# Patient Record
Sex: Female | Born: 1955 | Race: White | Hispanic: No | Marital: Married | Smoking: Never smoker
Health system: Southern US, Community
[De-identification: ages and names within clinical notes are randomized; demographics above are authoritative.]

## PROBLEM LIST (undated history)

## (undated) DIAGNOSIS — N8110 Cystocele, unspecified: Secondary | ICD-10-CM

## (undated) DIAGNOSIS — R0600 Dyspnea, unspecified: Secondary | ICD-10-CM

## (undated) DIAGNOSIS — K219 Gastro-esophageal reflux disease without esophagitis: Secondary | ICD-10-CM

## (undated) DIAGNOSIS — G9332 Myalgic encephalomyelitis/chronic fatigue syndrome: Secondary | ICD-10-CM

## (undated) DIAGNOSIS — A9230 West Nile virus infection, unspecified: Secondary | ICD-10-CM

## (undated) DIAGNOSIS — R7303 Prediabetes: Secondary | ICD-10-CM

## (undated) DIAGNOSIS — A6920 Lyme disease, unspecified: Secondary | ICD-10-CM

## (undated) DIAGNOSIS — J189 Pneumonia, unspecified organism: Secondary | ICD-10-CM

## (undated) DIAGNOSIS — K589 Irritable bowel syndrome without diarrhea: Secondary | ICD-10-CM

## (undated) DIAGNOSIS — K429 Umbilical hernia without obstruction or gangrene: Secondary | ICD-10-CM

## (undated) DIAGNOSIS — Z87442 Personal history of urinary calculi: Secondary | ICD-10-CM

## (undated) DIAGNOSIS — K760 Fatty (change of) liver, not elsewhere classified: Secondary | ICD-10-CM

## (undated) DIAGNOSIS — IMO0002 Reserved for concepts with insufficient information to code with codable children: Secondary | ICD-10-CM

## (undated) DIAGNOSIS — G47419 Narcolepsy without cataplexy: Secondary | ICD-10-CM

## (undated) DIAGNOSIS — T7840XA Allergy, unspecified, initial encounter: Secondary | ICD-10-CM

## (undated) DIAGNOSIS — D126 Benign neoplasm of colon, unspecified: Secondary | ICD-10-CM

## (undated) DIAGNOSIS — Z9889 Other specified postprocedural states: Secondary | ICD-10-CM

## (undated) DIAGNOSIS — R112 Nausea with vomiting, unspecified: Secondary | ICD-10-CM

## (undated) DIAGNOSIS — E785 Hyperlipidemia, unspecified: Secondary | ICD-10-CM

## (undated) DIAGNOSIS — D649 Anemia, unspecified: Secondary | ICD-10-CM

## (undated) DIAGNOSIS — K623 Rectal prolapse: Secondary | ICD-10-CM

## (undated) DIAGNOSIS — R5382 Chronic fatigue, unspecified: Secondary | ICD-10-CM

## (undated) DIAGNOSIS — R253 Fasciculation: Secondary | ICD-10-CM

## (undated) DIAGNOSIS — G709 Myoneural disorder, unspecified: Secondary | ICD-10-CM

## (undated) DIAGNOSIS — K2950 Unspecified chronic gastritis without bleeding: Secondary | ICD-10-CM

## (undated) DIAGNOSIS — K222 Esophageal obstruction: Secondary | ICD-10-CM

## (undated) DIAGNOSIS — M1990 Unspecified osteoarthritis, unspecified site: Secondary | ICD-10-CM

## (undated) HISTORY — DX: Chronic fatigue, unspecified: R53.82

## (undated) HISTORY — DX: Reserved for concepts with insufficient information to code with codable children: IMO0002

## (undated) HISTORY — PX: TONSILLECTOMY: SUR1361

## (undated) HISTORY — DX: Narcolepsy without cataplexy: G47.419

## (undated) HISTORY — PX: TONSILLECTOMY: SHX5217

## (undated) HISTORY — DX: Rectal prolapse: K62.3

## (undated) HISTORY — DX: Benign neoplasm of colon, unspecified: D12.6

## (undated) HISTORY — DX: Esophageal obstruction: K22.2

## (undated) HISTORY — PX: ULNAR NERVE REPAIR: SHX2594

## (undated) HISTORY — PX: OTHER SURGICAL HISTORY: SHX169

## (undated) HISTORY — DX: Unspecified chronic gastritis without bleeding: K29.50

## (undated) HISTORY — DX: Irritable bowel syndrome, unspecified: K58.9

## (undated) HISTORY — PX: ABDOMINAL HYSTERECTOMY: SHX81

## (undated) HISTORY — DX: Myalgic encephalomyelitis/chronic fatigue syndrome: G93.32

## (undated) HISTORY — DX: Lyme disease, unspecified: A69.20

## (undated) HISTORY — PX: SPINE SURGERY: SHX786

## (undated) HISTORY — DX: Fatty (change of) liver, not elsewhere classified: K76.0

## (undated) HISTORY — DX: Hyperlipidemia, unspecified: E78.5

## (undated) HISTORY — DX: Allergy, unspecified, initial encounter: T78.40XA

## (undated) HISTORY — DX: Umbilical hernia without obstruction or gangrene: K42.9

## (undated) HISTORY — PX: PARTIAL HYSTERECTOMY: SHX80

## (undated) HISTORY — DX: West nile virus infection, unspecified: A92.30

## (undated) HISTORY — PX: DIAGNOSTIC LAPAROSCOPY: SUR761

## (undated) HISTORY — PX: BREAST BIOPSY: SHX20

## (undated) HISTORY — DX: Cystocele, unspecified: N81.10

---

## 2001-10-26 DIAGNOSIS — A9230 West Nile virus infection, unspecified: Secondary | ICD-10-CM

## 2001-10-26 HISTORY — DX: West nile virus infection, unspecified: A92.30

## 2002-01-05 ENCOUNTER — Encounter: Admission: RE | Admit: 2002-01-05 | Discharge: 2002-01-05 | Payer: Self-pay | Admitting: *Deleted

## 2004-05-22 ENCOUNTER — Encounter: Admission: RE | Admit: 2004-05-22 | Discharge: 2004-05-22 | Payer: Self-pay | Admitting: Family Medicine

## 2004-06-02 ENCOUNTER — Ambulatory Visit (HOSPITAL_COMMUNITY): Admission: RE | Admit: 2004-06-02 | Discharge: 2004-06-03 | Payer: Self-pay | Admitting: Neurosurgery

## 2004-10-07 ENCOUNTER — Ambulatory Visit: Payer: Self-pay | Admitting: Family Medicine

## 2004-11-20 ENCOUNTER — Ambulatory Visit: Payer: Self-pay | Admitting: Internal Medicine

## 2004-11-25 ENCOUNTER — Ambulatory Visit: Payer: Self-pay | Admitting: Family Medicine

## 2004-12-04 ENCOUNTER — Ambulatory Visit: Payer: Self-pay | Admitting: Internal Medicine

## 2004-12-04 HISTORY — PX: UPPER GASTROINTESTINAL ENDOSCOPY: SHX188

## 2004-12-12 ENCOUNTER — Ambulatory Visit: Payer: Self-pay | Admitting: Internal Medicine

## 2005-05-15 ENCOUNTER — Ambulatory Visit: Payer: Self-pay | Admitting: Family Medicine

## 2006-11-02 ENCOUNTER — Ambulatory Visit: Payer: Self-pay | Admitting: Family Medicine

## 2006-11-24 ENCOUNTER — Ambulatory Visit: Payer: Self-pay | Admitting: Family Medicine

## 2006-11-30 ENCOUNTER — Ambulatory Visit: Payer: Self-pay | Admitting: Family Medicine

## 2008-06-21 ENCOUNTER — Ambulatory Visit: Payer: Self-pay | Admitting: Family Medicine

## 2008-06-21 DIAGNOSIS — E7800 Pure hypercholesterolemia, unspecified: Secondary | ICD-10-CM | POA: Insufficient documentation

## 2008-06-21 DIAGNOSIS — N952 Postmenopausal atrophic vaginitis: Secondary | ICD-10-CM | POA: Insufficient documentation

## 2008-06-21 DIAGNOSIS — N951 Menopausal and female climacteric states: Secondary | ICD-10-CM | POA: Insufficient documentation

## 2008-06-21 DIAGNOSIS — M79609 Pain in unspecified limb: Secondary | ICD-10-CM | POA: Insufficient documentation

## 2008-06-27 ENCOUNTER — Encounter: Payer: Self-pay | Admitting: Family Medicine

## 2008-06-28 ENCOUNTER — Encounter: Payer: Self-pay | Admitting: Family Medicine

## 2008-06-28 ENCOUNTER — Ambulatory Visit: Payer: Self-pay | Admitting: Family Medicine

## 2008-07-30 ENCOUNTER — Ambulatory Visit: Payer: Self-pay | Admitting: Internal Medicine

## 2008-07-30 DIAGNOSIS — K5909 Other constipation: Secondary | ICD-10-CM | POA: Insufficient documentation

## 2008-07-30 DIAGNOSIS — K589 Irritable bowel syndrome without diarrhea: Secondary | ICD-10-CM | POA: Insufficient documentation

## 2008-09-07 ENCOUNTER — Ambulatory Visit: Payer: Self-pay | Admitting: Family Medicine

## 2008-09-11 ENCOUNTER — Ambulatory Visit: Payer: Self-pay | Admitting: Cardiology

## 2008-10-16 ENCOUNTER — Ambulatory Visit: Payer: Self-pay | Admitting: Family Medicine

## 2008-12-31 ENCOUNTER — Ambulatory Visit: Payer: Self-pay | Admitting: Family Medicine

## 2009-01-01 ENCOUNTER — Telehealth: Payer: Self-pay | Admitting: Family Medicine

## 2009-01-02 ENCOUNTER — Telehealth: Payer: Self-pay | Admitting: Internal Medicine

## 2009-01-02 ENCOUNTER — Ambulatory Visit: Payer: Self-pay | Admitting: Internal Medicine

## 2009-01-02 ENCOUNTER — Telehealth (INDEPENDENT_AMBULATORY_CARE_PROVIDER_SITE_OTHER): Payer: Self-pay | Admitting: *Deleted

## 2009-06-27 ENCOUNTER — Ambulatory Visit: Payer: Self-pay | Admitting: Family Medicine

## 2009-07-04 ENCOUNTER — Encounter: Payer: Self-pay | Admitting: Family Medicine

## 2009-07-04 ENCOUNTER — Ambulatory Visit: Payer: Self-pay

## 2009-09-05 ENCOUNTER — Encounter: Payer: Self-pay | Admitting: Family Medicine

## 2009-09-05 ENCOUNTER — Telehealth (INDEPENDENT_AMBULATORY_CARE_PROVIDER_SITE_OTHER): Payer: Self-pay | Admitting: *Deleted

## 2010-04-14 ENCOUNTER — Telehealth (INDEPENDENT_AMBULATORY_CARE_PROVIDER_SITE_OTHER): Payer: Self-pay | Admitting: *Deleted

## 2010-04-14 ENCOUNTER — Ambulatory Visit: Payer: Self-pay | Admitting: Family Medicine

## 2010-04-14 DIAGNOSIS — R197 Diarrhea, unspecified: Secondary | ICD-10-CM | POA: Insufficient documentation

## 2010-05-13 ENCOUNTER — Encounter: Payer: Self-pay | Admitting: Family Medicine

## 2010-05-21 ENCOUNTER — Telehealth: Payer: Self-pay | Admitting: Family Medicine

## 2010-07-10 ENCOUNTER — Encounter: Payer: Self-pay | Admitting: Family Medicine

## 2010-08-06 ENCOUNTER — Ambulatory Visit: Payer: Self-pay | Admitting: Family Medicine

## 2010-08-06 DIAGNOSIS — R350 Frequency of micturition: Secondary | ICD-10-CM | POA: Insufficient documentation

## 2010-08-06 DIAGNOSIS — S2239XA Fracture of one rib, unspecified side, initial encounter for closed fracture: Secondary | ICD-10-CM | POA: Insufficient documentation

## 2010-08-06 LAB — CONVERTED CEMR LAB
Bilirubin Urine: NEGATIVE
Epithelial cells, urine: 1 /lpf
Nitrite: NEGATIVE
Protein, U semiquant: NEGATIVE
Urobilinogen, UA: 0.2
WBC Urine, dipstick: NEGATIVE
WBC, UA: 0 cells/hpf
Yeast, UA: 0

## 2010-08-07 ENCOUNTER — Encounter: Payer: Self-pay | Admitting: Family Medicine

## 2010-08-25 ENCOUNTER — Encounter: Payer: Self-pay | Admitting: Family Medicine

## 2010-09-16 ENCOUNTER — Telehealth: Payer: Self-pay | Admitting: Family Medicine

## 2010-10-15 ENCOUNTER — Encounter: Payer: Self-pay | Admitting: Family Medicine

## 2010-11-16 ENCOUNTER — Encounter: Payer: Self-pay | Admitting: Family Medicine

## 2010-11-23 LAB — CONVERTED CEMR LAB
ALT: 31 units/L (ref 0–35)
AST: 34 units/L (ref 0–37)
Alkaline Phosphatase: 90 units/L (ref 39–117)
BUN: 9 mg/dL (ref 6–23)
Bacteria, UA: 0
Bilirubin Urine: NEGATIVE
Bilirubin, Direct: 0.1 mg/dL (ref 0.0–0.3)
Blood in Urine, dipstick: NEGATIVE
CO2: 31 meq/L (ref 19–32)
Casts: 0 /lpf
Chloride: 98 meq/L (ref 96–112)
Eosinophils Relative: 5.1 % — ABNORMAL HIGH (ref 0.0–5.0)
Glucose, Bld: 89 mg/dL (ref 70–99)
Glucose, Urine, Semiquant: NEGATIVE
Hemoglobin: 13.7 g/dL (ref 12.0–15.0)
Ketones, urine, test strip: NEGATIVE
Lymphocytes Relative: 26.3 % (ref 12.0–46.0)
Monocytes Relative: 7.8 % (ref 3.0–12.0)
Mucus, UA: 0
Nitrite: NEGATIVE
Platelets: 309 10*3/uL (ref 150–400)
Potassium: 4.1 meq/L (ref 3.5–5.1)
Protein, U semiquant: NEGATIVE
RBC / HPF: 0
RDW: 13 % (ref 11.5–14.6)
Sodium: 138 meq/L (ref 135–145)
Specific Gravity, Urine: 1.01
Total Protein: 7.6 g/dL (ref 6.0–8.3)
Urine crystals, microscopic: 0 /hpf
Urobilinogen, UA: 0.2
WBC Urine, dipstick: NEGATIVE
WBC: 5.4 10*3/uL (ref 4.5–10.5)
Yeast, UA: 0
pH: 6

## 2010-11-25 NOTE — Assessment & Plan Note (Signed)
Summary: 30 MIN F/U PER DR TOWER/CLE   Vital Signs:  Patient profile:   55 year old female Height:      65 inches Weight:      167.25 pounds BMI:     27.93 Temp:     98.8 degrees F oral Pulse rate:   96 / minute Pulse rhythm:   regular BP sitting:   114 / 70  (left arm) Cuff size:   regular  Vitals Entered By: Lewanda Rife LPN (August 06, 2010 12:09 PM) CC: follow-up visit at request of pain dr. 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 no trauma-- but did tip foward over a bear cage - felt a pop and a lot of pain , and then happened on the other side  Dr Patsy Lager thought there was an x ray - told it may be a fracture  Dr Neldon Mc ordered 2nd set -- did not see fx but not good views either  was concerned about bone density  pain has gradually gone away  is good about ca and vit D    has broken bones in feet with a good amt of trauma    diarrhea for over a month-- with her husband  was 3-4 months  hers has finally cleared up  stomach is hard and bloated in general  Dr Patsy Lager had given her some antiparasitics -- one time -- unknown what it was  was given albenazole but did not take it  husband is better than he was  no worms in stool or rectal itching   has had some stress diet has not changed    urine smells bad-- often - no change in diet  no change in supplements  going on for quite a while  urinates very frequently - especially during the night  no burning  no blood  has urge incontinence -- going on frequently for 1-2 years  stress incontinence -since early 20s  had partial hyst in past   no vaginal itch/ burn/ odor / discharge   does eat activia  occ probiotics   does want to go ahead and get her gallbladder out       Allergies: 1)  ! Sulfa  Past History:  Past Medical History: Last updated: 10/16/2008 chronic fatigue syndrome - CFIDs chronic joint and muscle pain due to above  ? tick bourne illness- contrib to above  all rhinitis- seasonal migraine chronic gastritis  hyperlipidemia    specialist for cfids -- Dr Neldon Mc   Past Surgical History: Last updated: 06/27/2009 tonsillectomy partial hyst- bleeding  ulnar nerve sx times 5 laparoscopy [place] virus 03 breast bx neg 04 LS MRI L4L5 disk extrusion -- lumbar laminectomy 05 pelvic US neg 06 EGD gastritis chronic 06 [place] gallstones 08 2009 5th R toe fx  Family History: Last updated: 09/07/2008 father DM, HTN, CVA (endarterectomy)  mother cancer - [place] - Sterns, HTN, macroglobulinemia with chemo (passed away Mar 10, 2023) GM paternal breast ca , lung ca, blood ca GM cerebral hem some family with bipolar gf with etoh  No FH of Colon Cancer:  Social History: Last updated: 06/21/2008 non smoker  runs a wildlife center  married no children  no alcohol  Review of Systems General:  Complains of fatigue; denies fever, loss of appetite, and malaise. Eyes:  Denies blurring and eye irritation. CV:  Denies chest pain or discomfort, lightheadness, and palpitations. Resp:  Complains of chest pain with inspiration; denies cough, shortness of breath, and wheezing. GI:  Complains of abdominal pain and diarrhea; denies change in bowel habits, dark tarry stools, indigestion, and nausea. GU:  Denies dysuria and urinary frequency. MS:  Denies joint pain and joint swelling. Derm:  Denies itching, lesion(s), poor wound healing, and rash. Neuro:  Denies numbness and tingling. Psych:  Denies anxiety and depression. Heme:  Denies abnormal bruising and bleeding.  Physical Exam  General:  Well-developed,well-nourished,in no acute distress; alert,appropriate and cooperative throughout examination Head:  normocephalic, atraumatic, and no  abnormalities observed.   Eyes:  vision grossly intact, pupils equal, pupils round, and pupils reactive to light.  no conjunctival pallor, injection or icterus  Mouth:  pharynx pink and moist.   Neck:  supple with full rom and no masses or thyromegally, no JVD or carotid bruit  Chest Wall:  slt tender along both lower rib borders  Lungs:  Normal respiratory effort, chest expands symmetrically. Lungs are clear to auscultation, no crackles or wheezes. Heart:  Normal rate and regular rhythm. S1 and S2 normal without gallop, murmur, click, rub or other extra sounds. Abdomen:  Bowel sounds positive,abdomen soft and non-tender without masses, organomegaly or hernias noted. Msk:  no acute joint changes  Extremities:  No clubbing, cyanosis, edema, or deformity noted with normal full range of motion of all joints.   Neurologic:  sensation intact to light touch, gait normal, and DTRs symmetrical and normal.  no tremor  Skin:  Intact without suspicious lesions or rashes Cervical Nodes:  No lymphadenopathy noted Inguinal Nodes:  No significant adenopathy Psych:  normal affect, talkative and pleasant    Impression & Recommendations:  Problem # 1:  FRACTURE, RIB (ICD-807.00) Assessment Unchanged ? 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 incl dogs have also had it  check giardia/ crypto / ova /parasite - and update Orders: T-Stool Giardia / Crypto- EIA (29562) T-Stool for O&P ([id number])  Problem # 3:  FREQUENCY, URINARY (ICD-788.41) Assessment: New with odor but no other symptoms cx urine if neg she would consider med for overactive bladder and urge incont Orders: T-Culture, Urine ([id number]) Specimen Handling (99000)  Problem # 4:  CHOLELITHIASIS  (ICD-574.2) Assessment: Deteriorated more symptomatic- will do ref to surg Orders: Surgical Referral (Surgery)  Complete Medication List: 1)  Morphine Sulfate 30 Mg Tabs (Morphine sulfate) .... As directed as needed 2)  Provigil 100 Mg Tabs (Modafinil) .... As needed 3)  Multivitamins  .... Daily 4)  D 1000 Plus Tabs (Fa-cyanocobalamin-b6-d-ca) .... Take 2 tab daily 5)  Triple Flex Bone & Joint 750-600-100 Mg Tabs (Glucosamine-chondroit-calcium) .... Take 1 daily 6)  Dhea 25 Mg Caps (Prasterone (dhea)) .... Take 1 daily 7)  Ibuprofen 200 Mg Tabs (Ibuprofen) .... As needed otc  Patient Instructions: 1)  we will schedule dexa and surgical consult at check out  2)  I will update you with lab results   Current Allergies (reviewed today): ! SULFA  Laboratory Results   Urine Tests  Date/Time Received: August 06, 2010 12:10 PM  Date/Time Reported: August 06, 2010 12:10 PM   Routine Urinalysis   Color: yellow Appearance: Clear Glucose: negative   (Normal Range: Negative) Bilirubin: negative   (Normal Range: Negative) Ketone: negative   (Normal Range: Negative) Spec. Gravity: 1.015   (Normal Range: 1.003-1.035) Blood: negative   (Normal Range: Negative) pH: 6.5   (Normal Range: 5.0-8.0) Protein: negative   (Normal Range: Negative) Urobilinogen: 0.2   (Normal Range: 0-1) Nitrite: negative   (Normal Range: Negative) Leukocyte Esterace: negative   (Normal Range: Negative)  Urine Microscopic WBC/HPF: 0 RBC/HPF: 0 Bacteria/HPF: 0 Mucous/HPF: few Epithelial/HPF: 1 Crystals/HPF: few Casts/LPF: 0 Yeast/HPF: 0 Other: 0         Laboratory Results   Urine Tests    Routine Urinalysis   Color: yellow Appearance: Clear Glucose: negative   (Normal Range: Negative) Bilirubin: negative   (Normal Range: Negative) Ketone: negative   (Normal Range: Negative) Spec. Gravity: 1.015   (Normal Range: 1.003-1.035) Blood: negative   (Normal Range: Negative) pH: 6.5   (Normal  Range: 5.0-8.0) Protein: negative   (Normal Range: Negative) Urobilinogen: 0.2   (Normal Range: 0-1) Nitrite: negative   (Normal Range: Negative) Leukocyte Esterace: negative   (Normal Range: Negative)  Urine Microscopic WBC/hpf: 0 RBC/hpf: 0 Bacteria: 0 Mucous: few Epithelial: 1 Crystals/LPF: few Casts/LPF: 0 Yeast/HPF: 0 Other: 0

## 2010-11-25 NOTE — Consult Note (Signed)
Summary: [facility],Note  Dr.Alan Spanos,[facility],Note   Imported By: Beau Fanny 07/15/2010 08:49:58  _____________________________________________________________________  External Attachment:    Type:   Image     Comment:   External Document

## 2010-11-25 NOTE — Progress Notes (Signed)
  Phone Note Other Incoming   Summary of Call: FYI patient never returned stool samples on orders from 10.12.2011, tests cancelled. Initial call taken by: Mills Koller,  September 16, 2010 4:16 PM  Follow-up for Phone Call        please check in with her to find out what happened -- she was symptomatic when seen thanks Follow-up by: Judith Part MD,  September 16, 2010 5:15 PM  Additional Follow-up for Phone Call Additional follow up Details #1::        She said she is still symptomatic, but is never in a position to collect when it occurs. She still plans on collecting when she can. If she brings samples in I will order.  ok  Additional Follow-up by: Mills Koller,  September 17, 2010 9:58 AM

## 2010-11-25 NOTE — Consult Note (Signed)
Summary: [facility]   Imported By: Maryln Gottron 09/19/2010 12:42:51  _____________________________________________________________________  External Attachment:    Type:   Image     Comment:   External Document

## 2010-11-25 NOTE — Assessment & Plan Note (Signed)
Summary: ??broken rib 2:30   Vital Signs:  Patient profile:   55 year old female Height:      65 inches Weight:      161.2 pounds BMI:     26.92 Temp:     98.0 degrees F oral Pulse rate:   84 / minute Pulse rhythm:   regular BP sitting:   122 / 80  (left arm) Cuff size:   regular  Vitals Entered By: Benny Lennert CMA Duncan Dull) (April 14, 2010 2:22 PM)  History of Present Illness: Chief complaint broken ribs  R lower ribs fx fell on animal cage earlier today pain under R rib / breast area hurts to move  on morphine for CFS  diarrhea 4 months takes care of animals husband, animals have diarrhea, watery, no blood, no mucous  ROS: no fever, chills, sweats, + diarrhea. no constipation.  Allergies: 1)  ! Sulfa  Past History:  Past medical, surgical, family and social histories (including risk factors) reviewed, and no changes noted (except as noted below).  Past Medical History: Reviewed history from 10/16/2008 and no changes required. chronic fatigue syndrome - CFIDs chronic joint and muscle pain due to above  ? tick bourne illness- contrib to above  all rhinitis- seasonal migraine chronic gastritis  hyperlipidemia    specialist for cfids -- Dr Neldon Mc   Past Surgical History: Reviewed history from 06/27/2009 and no changes required. tonsillectomy partial hyst- bleeding  ulnar nerve sx times 5 laparoscopy [place] virus 03 breast bx neg 04 LS MRI L4L5 disk extrusion -- lumbar laminectomy 05 pelvic US neg 06 EGD gastritis chronic 06 [place] gallstones 08 2009 5th R toe fx  Family History: Reviewed history from 09/07/2008 and no changes required. father DM, HTN, CVA (endarterectomy)  mother cancer - [place] - Sterns, HTN, macroglobulinemia with chemo (passed away 03-Apr-2023) GM paternal breast ca , lung ca, blood ca GM cerebral hem some family with bipolar gf with etoh  No FH of Colon Cancer:  Social History: Reviewed history from 06/21/2008 and no changes  required. 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 No apparent alopecia or balding. Ears, externally no deformities PULM: Breathing comfortably in no respiratory distress EXT: No clubbing, cyanosis, or edema PSYCH: Normally interactive. Cooperative during the interview. Pleasant. Friendly and conversant. Not anxious or depressed appearing. Normal, full affect.  Msk:  R lower ribs ttp anterior and with some sternal compression   Impression & Recommendations:  Problem # 1:  RIB PAIN, RIGHT SIDED (ICD-786.50) Assessment New Appears to have 7th rib fx on xr  clinically consistent  continue with morphine as needed and activity mod  Orders: T-Ribs Unilateral 2 Views (71100TC)  Problem # 2:  DIARRHEA (ICD-787.91) stool studies  Orders: T-Culture, C-Diff Toxin A/B ([id number]) T-Stool Giardia / Crypto- EIA (46962) T-Culture, Stool ([id number])  Complete Medication List: 1)  Morphine Sulfate 30 Mg Tabs (Morphine sulfate) .... As directed as needed 2)  Provigil 100 Mg Tabs (Modafinil) .... As needed 3)  Multivitamins  .... Daily 4)  D 1000 Plus Tabs (Fa-cyanocobalamin-b6-d-ca) .... Take 2 tab daily 5)  Triple Flex Bone & Joint 750-600-100 Mg Tabs (Glucosamine-chondroit-calcium) .... Take 1 daily 6)  Dhea 25 Mg Caps (Prasterone (dhea)) .... Take 1 daily 7)  Ibuprofen 200 Mg Tabs (Ibuprofen) .... As needed otc  Patient Instructions: 1)  STOOL KIT -- NEEDS TO BE SENT TO [facility]  Current Allergies (reviewed today): ! SULFA

## 2010-11-25 NOTE — Progress Notes (Signed)
Summary: husband with diarrhea  Phone Note Call from Patient   Caller: Patient Summary of Call: Advised pt of lab results, [facility] [place].  She is asking what about her husband?  He has not been seen since 11/09. Judie Petit) Initial call taken by: Lowella Petties CMA,  May 21, 2010 12:05 PM  Follow-up for Phone Call        I am ok calling him in some Albendazole 200 mg, 2 by mouth x 1.   If they do not get better, then they should contact Dr. B. I think GI would make some sense. Follow-up by: Hannah Beat MD,  May 21, 2010 12:09 PM  Additional Follow-up for Phone Call Additional follow up Details #1::        Rx Called In and patient advised.Consuello Masse CMA   Additional Follow-up by: Benny Lennert CMA Duncan Dull),  May 21, 2010 12:15 PM

## 2010-11-25 NOTE — Progress Notes (Signed)
Summary: broken rib  Phone Note Call from Patient Call back at Home Phone [phone]   Caller: Patient Call For: Dr. Dayton Martes Summary of Call: Patient has an appt sceduled with you on wed. but patient wants to know if she needs to be seen berfore then. She was leaning over one of the animal cages and heard a popping sound and then felt a terrible pain on her right side in her rib area. She thinks that she may have broken a rib because the pain has gotten worse. 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  She wants to know if she needs to be seen sooner or if it is okay to wait until wed. appt. Please advise.  Initial call taken by: Melody Comas,  April 14, 2010 9:37 AM  Follow-up for Phone Call        I would advise pt that she needs to be seen sooner. Ruthe Mannan MD  April 14, 2010 9:40 AM  Comin in today at 2:30 to see Dr. Patsy Lager Follow-up by: Melody Comas,  April 14, 2010 9:45 AM

## 2010-12-09 ENCOUNTER — Ambulatory Visit (HOSPITAL_COMMUNITY): Admission: RE | Admit: 2010-12-09 | Payer: Self-pay | Source: Ambulatory Visit | Admitting: General Surgery

## 2010-12-30 ENCOUNTER — Telehealth: Payer: Self-pay | Admitting: Family Medicine

## 2011-01-06 NOTE — Progress Notes (Signed)
Summary: referral for infectious disease   Phone Note Call from Patient Call back at Home Phone [phone]   Caller: Patient Call For: Kerby Nora MD Summary of Call: Patient was told to think about possible infectious disease consult. She says that she is still sick and has decided that she would like to take that route.  Initial call taken by: Melody Comas,  December 30, 2010 2:17 PM  Follow-up for Phone Call        I will do ref for [facility] Follow-up by: Judith Part MD,  December 30, 2010 4:53 PM

## 2011-03-10 NOTE — Assessment & Plan Note (Signed)
[facility] OFFICE NOTE   NAME:Branch Branch                     MRN:          [id number]  DATE:09/11/2008                            DOB:          [birth year]    PRIMARY CARE PHYSICIAN:  Dr. Roxy Manns, at [place], [place].   HISTORY OF PRESENT ILLNESS:  This is a 55 year old woman with a history  of a tick-borne illness that has caused her joint pains and fatigue as  well as a history of hypercholesterolemia, who presents for evaluation  of fatigue and chest pain.  The patient has had since 1995 a  constellation of symptoms including significant joint pain as well as  chronic fatigue.  This was thought to be due to a tick-borne illness.  She does not have Lyme disease but has been thought to have a different  tick-borne illness.  The exact type it appears has not yet been worked  out.  She does have a history of atypical chest pain.  In January 2009,  she developed what she thought was a cold and since that time she has  actually had constant central substernal chest tightness.  It never  fully resolves and has been present constantly throughout the period  since January 2009.  The pain tends to be mild at baseline, but when she  undergoes emotional stress, the pain gets worse.  The pain is not worse  with exertion.  She had a particular severe episode last week.  In the  evening, she had an argument  with her father and the pain in her chest  increased to a 10/10.  She was able to go to sleep, however.  When she  woke up in the morning, the pain was still there 10/10.  It lasted for  about 5 hours the next day and then resolved back down to the baseline  of low level, which is approximately 1-2/10 pain.  The pain is not  pleuritic and as I mentioned it is not associated with exertion.  Additionally, the patient does have significant symptoms of chronic  fatigue.  This has been attributed to the  tick-borne illness.  She  states that she does not get short of breath when walking slowly on a  flat ground, but if she speeds up, she does get some shortness of  breath.  She is able to climb a flight of stairs with some mild  shortness of breath by the time she is at the top.  She has no orthopnea  or PND.  She is a nonsmoker.  Patient states that she has been  considering knee replacement surgery due to severe knee OA.   PAST MEDICAL HISTORY:  1. History of [place] virus 2003.  2. 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  3. History of lumbar laminectomy in 2005.  4. History of severe knee osteoarthritis thought to be due to her tick-      borne illness.  5. Hypercholesterolemia; however, she has not had her  lipids checked      in 2 years now.   MEDICATIONS:  Fish oil, morphine by mouth, and Provigil p.r.n.   SOCIAL HISTORY:  The patient is a lifelong nonsmoker.  She is a  vegetarian.  She do not drink alcohol.  She does not use illicit drugs.  She is married.  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   FAMILY HISTORY:  There is no family history of early coronary artery  disease.   REVIEW OF SYSTEMS:  Negative except as noted in the history of present  illness.   EKG today shows normal sinus rhythm with a single PVC.  It is otherwise  normal.   PHYSICAL EXAMINATION:  VITAL SIGNS:  Blood pressure 110/76, pulse is 106  and regular, weight is 151 pounds.  GENERAL:  This is a well-developed female in no apparent distress.  NEUROLOGIC:  Alert and oriented x3. Normal affect.  NECK:  There is no JVD, no thyromegaly, or thyroid nodule.  LUNGS:  Clear to auscultation bilaterally with normal respiratory  effort.  CARDIOVASCULAR:  Heart regular.  S1, S2.  No S3, no S4.  There is no  murmur.  There are 2+ dorsalis pedis pulses bilaterally.  There is no  peripheral edema.  There is no carotid  bruit.  ABDOMEN:  Soft, nontender.  No hepatosplenomegaly.  Normal bowel sounds.  EXTREMITIES:  No clubbing or cyanosis.  SKIN:  Normal exam.  HEENT:  Normal exam.  MUSCULOSKELETAL:  Normal exam.   ASSESSMENT AND PLAN:  This is a 55 year old with a history of  undifferentiated presumed tick-borne illness who presents with chronic  fatigue, mild dyspnea on exertion, and atypical chest pain.  1. Chest pain.  The patient's chest pain is quite atypical.  It has      been constant almost since January 2009.  It does get worse with      stress.  I think it probably is not cardiac in origin, however,      especially as the patient is probably going to have to undergo a      surgery on her knees in the near future, I do think it is      reasonable to do a stress test to rule out evidence of a coronary      artery disease.  We will do an exercise treadmill Myoview.  Pt      states that she should be able to walk on the treadmill despite her      knee OA (which begs the question of whether she truly is in need of      a knee surgery).  If she is unable to walk on the treadmill after      all, we can do an adenosine study.  2. Hypercholesterolemia.  The patient does carry a history of      hypercholesterolemia; however, she has not had her cholesterol      checked in about 2 years.  She is going to get her cholesterol done      at [facility] this week.  We will have her fax the results to [place].  3. Chronic mild dyspnea on exertion.  The patient does have chronic      mild dyspnea on exertion.  Some of the tick-borne illnesses have      the possibility of affecting the heart, such as Lyme disease,      although this tends to cause more of a conduction  system disease      than a cardiomyopathy.  However, I do think it is reasonable given      her symptoms to check an echocardiogram to make sure her LV      function is normal.     [name] Ancona, MD  Electronically Signed    DM/MedQ  DD:  09/11/2008  DT: 09/12/2008  Job #: [id number]   cc:   Roxy Manns, MD

## 2011-03-13 NOTE — Op Note (Signed)
[name], [name]                        ACCOUNT NO.:  [id number]   MEDICAL RECORD NO.:  [id number]                   PATIENT TYPE:  OIB   LOCATION:  3011                                 FACILITY:  [facility]   PHYSICIAN:  Hewitt Shorts, M.D.            DATE OF BIRTH:  1956/04/15   DATE OF PROCEDURE:  06/02/2004  DATE OF DISCHARGE:                                 OPERATIVE REPORT   PREOPERATIVE DIAGNOSIS:  Right L4-5 lumbar disk herniation, lumbar  degenerative disk disease, lumbar spondylosis, lumbar radiculopathy.   POSTOPERATIVE DIAGNOSIS:  Right L4-5 lumbar disk herniation, lumbar  degenerative disk disease, lumbar spondylosis, lumbar radiculopathy.   PROCEDURE:  Right L4-5 lumbar laminotomy and microdiskectomy.   SURGEON:  Hewitt Shorts, M.D.   ASSISTANT:  Coletta Memos, M.D.   ANESTHESIA:  General endotracheal.   INDICATION:  Patient is a 55 year old woman who presented with lumbar  radiculopathy, was found by MRI scan to have a large right L4-5 lumbar disk  herniation.  Decision was made to proceed with elective laminotomy and  microdiskectomy.   PROCEDURE:  The patient brought to the operating room and placed under  general endotracheal anesthesia.  The patient was turned to the prone  position.  The lumbar region was prepped with DuraPrep and draped in a  sterile fashion.  The midline was infiltrated with local anesthetic with  epinephrine.  An x-ray was taken, the L4-5 level identified and a midline  incision made over the L4-5 level, being carried down through the  subcutaneous tissue.  Bipolar cautery and electrocautery was used to  maintain hemostasis.  Dissection was carried down to the lumbar fascia which  was incised on the right side of the midline in the spinous process lamina  in a subperiosteal fashion.  The L4-5 intralaminar space was identified, an  x-ray was taken to confirm the localization, and then __________ to  illumination and  visualization and the remainder of the decompression was  performed using microdissection and microsurgical technique.  A laminotomy  was performed using the __________ drill and then the ligamentum flavum was  removed using Kerrison punches.  Here we identified the thecal sac and  exiting right L5 nerve root.  The thecal sac and nerve root gently retracted  medially, exposing the disk herniation which was removed in a piecemeal  fashion, it was a free fragment.  I then further extruded fragments and we  further incised the remaining ligament and annulus and entered into the disk  space and proceeded with thorough diskectomy, removing all loose fragments  and disk material from both the disk space and the epidural space.  In the  end, good decompression of the thecal sac and nerve root was achieved.  Once  diskectomy was completed and hemostasis was stopped with the use of bipolar  cautery and Gelfoam soaked in thrombin, the Gelfoam was removed.  We  instilled 2 mL of fentanyl and  80 mg of Depo-Medrol into the epidural space  and proceeded with closure.  Deep fascia was closed with interrupted  __________ Vicryl suture, subcutaneous and subcuticular layer closed with  interrupted inverted 2-0 Vicryl sutures  and the skin was reapproximated with Dermabond.  The patient tolerated the  procedure well.  The estimated blood loss was 25 mL.  The sponge and needle  count were correct.  Following surgery the patient was returned back to the  supine position to reverse anesthetic, extubated and transferred to the  recovery room for further care.                                               Hewitt Shorts, M.D.    RWN/MEDQ  D:  06/02/2004  T:  06/02/2004  Job:  [id number]

## 2011-04-14 ENCOUNTER — Encounter: Payer: Self-pay | Admitting: Cardiovascular Disease

## 2011-08-03 ENCOUNTER — Telehealth: Payer: Self-pay | Admitting: *Deleted

## 2011-08-03 NOTE — Telephone Encounter (Signed)
Given the severity of symptoms- may need some IV fluids (which we cannot give in office )  She really does need to be seen- and initially that may be UC or ER where they can give her fluids -- if she is too sick to get there herself by all means - calling EMS is reasonable ! That is a lot of days to have those symptoms- I worry about dehydration

## 2011-08-03 NOTE — Telephone Encounter (Signed)
Ok - if she does not go to ER or get dehydrated f/u with me or GI when she can for eval

## 2011-08-03 NOTE — Telephone Encounter (Signed)
Patient notified as instructed by telephone. Pt said she is able to keep some fluids and pedialyte down now but still diarrhea. Pt does not want to wait several hrs at ER so pt said she knows the signs of dehydration and she will go to ER if she has to.

## 2011-08-03 NOTE — Telephone Encounter (Signed)
Patient called c/o having diarrhea for 16 days. Patient states that she has had a fever off and on, muscle aches, vomiting and headache. Patient states that the diarrhea is so bad that she does not think that she can make it to the office to be seen. Patient states that she had some Phenergan left over and has been taking that for the vomiting. Patient wants to know what you recommend? Pharmacy-[facility]

## 2011-08-03 NOTE — Telephone Encounter (Signed)
Left vm for pt to callback 

## 2011-08-03 NOTE — Telephone Encounter (Signed)
Spoke with patient and notified her of your recommendation. 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 She said she has bags of LR there. I asked if she could start an IV on herself and she said no that it would be SQ fluids. I told her that we didn't recommend that and strongly suggest the IV route. I also advised that since she was running a temp, she probably needed labs to check for infection and possibly abx. She understood. She said she is familiar with dehydration and knows the dangers of it. She said she would consider UC or ER. I told her to let us know where she went, so we could get records on her. She verbalized understanding.

## 2011-08-03 NOTE — Telephone Encounter (Signed)
Thanks I agree - should not be giving her own IV fluids and she needs to also be worked up  I still recommend ER or UC that can do that-thanks

## 2011-08-04 NOTE — Telephone Encounter (Signed)
Left vm for pt to callback 

## 2011-08-04 NOTE — Telephone Encounter (Signed)
Patient notified as instructed by telephone. Pt scheduled appt with Dr Dayton Martes 08/05/11 at 12noon. Pt is to go to ER if signs of dehydration or if condition worsens before appt. Pt agreed.

## 2011-08-05 ENCOUNTER — Ambulatory Visit (INDEPENDENT_AMBULATORY_CARE_PROVIDER_SITE_OTHER): Payer: 59 | Admitting: Family Medicine

## 2011-08-05 ENCOUNTER — Encounter: Payer: Self-pay | Admitting: Family Medicine

## 2011-08-05 VITALS — BP 120/70 | HR 104 | Temp 98.9°F | Ht 66.0 in | Wt 179.5 lb

## 2011-08-05 DIAGNOSIS — R197 Diarrhea, unspecified: Secondary | ICD-10-CM

## 2011-08-05 DIAGNOSIS — R000 Tachycardia, unspecified: Secondary | ICD-10-CM | POA: Insufficient documentation

## 2011-08-05 DIAGNOSIS — R1110 Vomiting, unspecified: Secondary | ICD-10-CM | POA: Insufficient documentation

## 2011-08-05 MED ORDER — PROMETHAZINE HCL 25 MG PO TABS: 25.0000 mg | ORAL_TABLET | Freq: Four times a day (QID) | ORAL | Status: DC | PRN

## 2011-08-05 NOTE — Progress Notes (Signed)
HPI: 55 yo new to me here for 8 weeks of GI complaints. Started with nausea and vomiting which has improved with phenergan. Then developed diffuse watery stools- up to 15 stools per day at one point, now approx 4 watery stools per day. No blood or mucous in stool. Has had a fever on and off, Tmax 103 a couple of weeks ago.  Staying hydrated- drinking pedialyte.  No abdominal pain, has had some cramping.  Works at an [employer], counselling- multiple exposures to sick animals. Her husband and dog have similar symptoms.  Has not noticed any worms in stool. No rectal itching.  ROS: See HPI  Patient Active Problem List  Diagnoses  . PURE HYPERCHOLESTEROLEMIA  . CONSTIPATION  . CONSTIPATION, CHRONIC  . IRRITABLE BOWEL SYNDROME  . CHOLELITHIASIS  . POSTMENOPAUSAL STATUS  . VAGINITIS, ATROPHIC  . Pain in Soft Tissues of Limb  . SWELLING OF LIMB  . COSTOCHONDRITIS, LEFT  . Unspecified Chest Pain  . DIARRHEA  . FREQUENCY, URINARY  . Abdominal pain, right upper quadrant  . FRACTURE, RIB  . Vomiting  . Tachycardia - pulse   Past Medical History  Diagnosis Date  . 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 Dr. Neldon Mc   . Allergic rhinitis   . Migraine   . Chronic gastritis   . Hyperlipidemia   . West Nile virus infection 2003   Past Surgical History  Procedure Date  . Tonsillectomy   . Partial hysterectomy     bleeding  . Ulnar nerve repair     sx times 5  . Laprascopy   . Breast biopsy '04    neg  . Ls mri     L4L5 disk extrusion-lumbar laminectomy 05  . US transvaginal pelvic modified '06    neg   . [place] gallstones '08  . 5th r toe fx '09   History  Substance Use Topics  . Smoking status: Never Smoker   . Smokeless tobacco: Not on file   Comment: non smoker  . Alcohol Use: No   Family History  Problem Relation Age of Onset  . Bipolar disorder      some family  . Colon cancer Neg Hx    Allergies    Allergen Reactions  . Sulfonamide Derivatives    Current Outpatient Prescriptions on File Prior to Visit  Medication Sig Dispense Refill  . Cholecalciferol (D 1000) 1000 UNITS tablet Take 2,000 Units by mouth daily.        Marland Kitchen ibuprofen (ADVIL,MOTRIN) 200 MG tablet Take 200 mg by mouth every 6 (six) hours as needed.        . modafinil (PROVIGIL) 100 MG tablet Take 100 mg by mouth as needed.        Marland Kitchen morphine (MSIR) 30 MG tablet Take 30 mg by mouth every 4 (four) hours as needed. UAD PRN       . Multiple Vitamin (MULTIVITAMIN) tablet Take 1 tablet by mouth daily.        Marland Kitchen DHEA 25 MG CAPS Take 1 capsule by mouth daily.        . Glucosamine-Chondroit-Calcium (TRIPLE FLEX BONE & JOINT) 750-600-100 MG TABS Take 1 tablet by mouth daily.         The PMH, PSH, Social History, Family History, Medications, and allergies have been reviewed in [facility], and have been updated if relevant.  Physical exam: BP 120/70  Pulse 104  Temp(Src) 98.9  F (37.2 C) (Oral)  Ht 5\' 6"  (1.676 m)  Wt 179 lb 8 oz (81.421 kg)  BMI 28.97 kg/m2  General:  Well-developed,well-nourished,in no acute distress; alert,appropriate and cooperative throughout examination Head:  normocephalic and atraumatic.   Eyes:  vision grossly intact, pupils equal, pupils round, and pupils reactive to light.   Ears:  R ear normal and L ear normal.   Nose:  no external deformity.    Lungs:  Normal respiratory effort, chest expands symmetrically. Lungs are clear to auscultation, no crackles or wheezes. Heart:  Tachycardic, normal rhythm  S1 and S2 normal without gallop, murmur, click, rub or other extra sounds. Abdomen:  Bowel sounds positive,abdomen soft and non-tender without masses, organomegaly or hernias noted. Msk:  No deformity or scoliosis noted of thoracic or lumbar spine.   Extremities:  No clubbing, cyanosis, edema, or deformity noted with normal full range of motion of all joints.   Neurologic:  alert & oriented X3 and gait normal.    Skin:  Intact without suspicious lesions or rashes Psych:  Cognition and judgment appear intact. Alert and cooperative with normal attention span and concentration. No apparent delusions, illusions, hallucinations  Assessment and Plan: 1. Diarrhea   New-does seem infectious in nature given that her husband and animals have similar symptoms. Symptoms appear to be improving. Will check stool culture, O and P, C. Diff and CBC. Advised against antidiarrheal medications. Stool Culture, Clostridium difficile toxin, Ova and Parasite screen (Stool), CBC w/Diff  2. Vomiting   New-improving. LIkely related to #1, continue as needed phenergan. Basic Metabolic Panel (BMET)  3. Tachycardia - pulse   New.-has had some readings elevated in 90s in past. ?secondary to dehydration although she feels she is staying hydrated. Will check BMET.  She will check pulse at work and call back with an update.  Remains asymptomatic.

## 2011-08-05 NOTE — Patient Instructions (Signed)
Good to see you.  Please continuing staying hydrated. Check your pulse at home.  If it's not coming down, please schedule a follow up appointment.

## 2011-08-06 ENCOUNTER — Encounter: Payer: Self-pay | Admitting: *Deleted

## 2011-08-11 ENCOUNTER — Other Ambulatory Visit: Payer: Self-pay | Admitting: Family Medicine

## 2011-08-11 DIAGNOSIS — R197 Diarrhea, unspecified: Secondary | ICD-10-CM

## 2011-08-11 NOTE — Progress Notes (Signed)
Addended by: Alvina Chou on: 08/11/2011 11:18 AM   Modules accepted: Orders

## 2011-08-21 ENCOUNTER — Other Ambulatory Visit: Payer: Self-pay | Admitting: Family Medicine

## 2011-08-27 ENCOUNTER — Other Ambulatory Visit: Payer: Self-pay | Admitting: Family Medicine

## 2011-08-27 NOTE — Telephone Encounter (Signed)
Refilled electronically by Dr. Dayton Martes.

## 2011-09-08 ENCOUNTER — Other Ambulatory Visit: Payer: Self-pay | Admitting: Family Medicine

## 2011-09-08 DIAGNOSIS — R197 Diarrhea, unspecified: Secondary | ICD-10-CM

## 2011-09-08 NOTE — Progress Notes (Signed)
I advised patient via telephone today that I will check with Shirlee Limerick when she returns tomorrow and she what the status of her referral is.

## 2011-09-08 NOTE — Progress Notes (Signed)
I'm not sure what happened. I re entered referral.

## 2011-09-10 ENCOUNTER — Telehealth: Payer: Self-pay | Admitting: Family Medicine

## 2011-09-10 DIAGNOSIS — R197 Diarrhea, unspecified: Secondary | ICD-10-CM

## 2011-09-10 NOTE — Telephone Encounter (Signed)
Received a call from Infectiuos Disease office , after reviewing the noted on this patient, Dr Algis Liming said that this patient needs to see a Gastroenterologist first before she ses them. She has seen Dr Leone Payor before and says she will go to see them. Pls put GI referral in for this patient. MK

## 2011-09-10 NOTE — Telephone Encounter (Signed)
Noted  Referral placed.

## 2011-09-30 ENCOUNTER — Ambulatory Visit (INDEPENDENT_AMBULATORY_CARE_PROVIDER_SITE_OTHER): Payer: 59 | Admitting: Internal Medicine

## 2011-09-30 ENCOUNTER — Ambulatory Visit: Payer: 59 | Admitting: Internal Medicine

## 2011-09-30 VITALS — BP 122/68 | HR 96 | Ht 66.0 in | Wt 187.0 lb

## 2011-09-30 DIAGNOSIS — K589 Irritable bowel syndrome without diarrhea: Secondary | ICD-10-CM

## 2011-09-30 DIAGNOSIS — R197 Diarrhea, unspecified: Secondary | ICD-10-CM

## 2011-09-30 NOTE — Patient Instructions (Addendum)
Please go to the basement upon leaving today to have your labs done. Please drink plenty of water/liquid and keep up with your fluid loss. We will call you with results and plans.

## 2011-09-30 NOTE — Progress Notes (Addendum)
Subjective:    Patient ID: [name] Branch, female    DOB: 07-14-1956, 55 y.o.   MRN: [id number]  HPI This 55 year old married white woman presents with complaints of diarrhea. I had seen her in the past for chronic constipation and IBS. She says that about a year and a half ago she developed terrible diarrhea problems that when on, eventually became intermittent. Her husband and father also has had the same problems. These have been associated with fevers. Things seemed to improve over time, but in the past 10-11 weeks she has had further problems with very loose frequent stools, watery, urgent, throughout the day and night. This is associated with malaise, bloating and nausea and vomiting at times. She brings a letter from her chronic fatigue specialist, Dr. Neldon Mc, who practices in [place].  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 She said that when her problems started one half years ago and thought she had suffered from tapeworms and hookworms and had diarrhea problems. She has not had any empiric therapy. She has tried some over-the-counter agents like Gas-X. She has had 2 sets of stool studies and include routine culture, Clostridium difficile and ova and parasite exam though some medium was not used in the ova and parasite exam so was thought to be less sensitive. Nevertheless, these were all negative in the summer of 2011, and in the past month.  She says she has fevers from 100-103 Fahrenheit. Temperature was 100.5 last night. She has chills and myalgias at times. She has some crampy lower abdominal pain. More lately, the diarrhea is better in stools are soft to semi-formed. There is no bleeding.  She is not describing diarrheal illness in any of the animals she cares for her at this time. Her husband and father have had similar cyclic symptoms but had not sought medical care. She says there waiting for  her to be diagnosed. Allergies  Allergen Reactions  . Sulfonamide Derivatives    Outpatient Prescriptions Prior to Visit  Medication Sig Dispense Refill  . DHEA 25 MG CAPS Take 1 capsule by mouth daily.        Marland Kitchen ibuprofen (ADVIL,MOTRIN) 200 MG tablet Take 200 mg by mouth every 6 (six) hours as needed.        . modafinil (PROVIGIL) 100 MG tablet Take 100 mg by mouth as needed.        Marland Kitchen morphine (MSIR) 30 MG tablet Take 30 mg by mouth every 4 (four) hours as needed. UAD PRN       . Multiple Vitamin (MULTIVITAMIN) tablet Take 1 tablet by mouth daily.        . promethazine (PHENERGAN) 25 MG tablet TAKE 1 TABLET BY MOUTH EVERY 6 HOURS AS NEEDED FOR NAUSEA  30 tablet  0  . Cholecalciferol (D 1000) 1000 UNITS tablet Take 2,000 Units by mouth daily.        . Glucosamine-Chondroit-Calcium (TRIPLE FLEX BONE & JOINT) 750-600-100 MG TABS Take 1 tablet by mouth daily.         Past Medical History  Diagnosis Date  . 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 Dr. Neldon Mc   . Allergic rhinitis   . Migraine   . Chronic gastritis   . Hyperlipidemia   . West Nile virus infection 2003  . Lyme disease     "stari"  Past Surgical History  Procedure Date  . Tonsillectomy   . Partial hysterectomy     bleeding  . Ulnar nerve repair     sx times 5  . Laprascopy   . Breast biopsy '04    neg; right  . 5th r toe fx '09  . Upper gastrointestinal endoscopy 12/04/2004    gastritis   History   Social History  . Marital Status: Married            .     Social History Main Topics  . Smoking status: Never Smoker   . Smokeless tobacco: Never Used   Comment: non smoker  . Alcohol Use: No  . Drug Use: No  .            Social History Narrative   Runs a wildlife center. Married, no children.    Family History  Problem Relation Age of Onset  . Bipolar disorder      some family  . Colon cancer Neg Hx   . Breast cancer Paternal  Grandmother   . Diabetes Father   . Hypertension Father   . Stroke Father   . Hypertension Mother   . Cancer Mother     Darrick Grinder  . Alcohol abuse Maternal Grandfather   . Ulcers Mother         Review of Systems As above    Objective:   Physical Exam General:  Well-developed, well-nourished and in no acute distress obese Eyes:  anicteric. ENT:   Mouth and posterior pharynx free of lesions.  Neck:   supple w/o thyromegaly or mass.  Lungs: Clear to auscultation bilaterally. Heart:  S1S2, no rubs, murmurs, gallops. Abdomen: soft, non-tender, no hepatosplenomegaly, hernia, or mass and BS+.  Rectal: Female staff present - normal anoderm, no mass, nontender and no stool Lymph:  no cervical or supraclavicular adenopathy. Extremities:   no edema Skin   no rash. Neuro:  A&O x 3.  Psych:  appropriate mood and  Affect.   Data Reviewed: Office notes, labs, letter from her chronic fatigue specialist        Assessment & Plan:  This is a complicated situation. In person, she looks better than the illness that is described. Infectious diarrhea seems possible, there are some hot circumstances in that her husband and father apparently just as though but they have not sought medical care. Exposure to animals raises many questions. In the past she had constipation predominant IBS. It is known that IBS can switch from chronic constipation 2 morbid diarrhea-like syndrome we must consider that in the differential. However, if she truly having fevers, and that would not necessarily be the case.  I am going to start with simple testing to include stool for fecal lactoferrin, and a sedimentation rate. A colonoscopy is probably appropriate but I did not want to do that first. Will research this a bit further to see if there is some infection likely based upon her animal exposures in what I know no I suspect that will be difficult.  We contacted the patient because she did not have stool studies  or ESR performed.  She sent back a letter indicating that she does her labs at [facility] though she left and did not explain that to me. She has subsequently taken an ant-protozoal medication prescribed by her chronic fatigue specialist and is trying to see if that will help her episodic diarrhea and reported fevers.   Hopefully whatever she took will help (?  Tindazole).

## 2011-09-30 NOTE — Assessment & Plan Note (Addendum)
The cause of her chronic diarrhea symptoms are not clear. IBS is still a possibility I think though fevers would not be a part of that. I do not know that we have documented fevers. I don't think so. She could have some type of a strange parasitic infection related to the animal exposures. I will look into that. To me she looked softly well to have some sort of severe chronic diarrheal illness. If she did, IBS would fit most with that. Again, with fever that suggest another problem. However, documentation would be needed.  I had previously recommended colonoscopy for screening, now she may need one as a diagnostic test. She did not do well with sedation for an EGD in the past so she would need deep sedation with propofol for any endoscopic procedures.  Will consider empiric antibiotics. It depends on whether there is other stool testing that makes sense based upon further investigation of possible causes.

## 2011-09-30 NOTE — Assessment & Plan Note (Signed)
I think her GI complaints could be IBS. Await stool for WBC, sedimentation rate. Colonoscopy may be needed to look at things.

## 2011-10-14 ENCOUNTER — Telehealth: Payer: Self-pay | Admitting: Gastroenterology

## 2011-10-14 NOTE — Telephone Encounter (Signed)
Called and left a message on voicemail for patient to call me back. 

## 2011-10-14 NOTE — Telephone Encounter (Signed)
Message copied by Bernita Buffy on Wed Oct 14, 2011  4:13 PM ------      Message from: Stan Head E      Created: Wed Oct 14, 2011  3:51 PM      Regarding: is she going to do labs?       Do not see that she had blood drawn or collected and submitted stool tests            Please ask her about this

## 2011-10-16 NOTE — Telephone Encounter (Signed)
Called both contact numbers and left a message for patient to return my call.

## 2011-10-22 NOTE — Telephone Encounter (Signed)
Have tried to contact patient several time by telephone and left messages concerning lads. Letter mailed to patient.

## 2011-10-27 HISTORY — PX: CHOLECYSTECTOMY: SHX55

## 2011-11-10 ENCOUNTER — Inpatient Hospital Stay: Payer: Self-pay | Admitting: Surgery

## 2011-11-10 LAB — COMPREHENSIVE METABOLIC PANEL
Albumin: 3.8 g/dL (ref 3.4–5.0)
Anion Gap: 9 (ref 7–16)
BUN: 11 mg/dL (ref 7–18)
Bilirubin,Total: 0.5 mg/dL (ref 0.2–1.0)
Creatinine: 0.69 mg/dL (ref 0.60–1.30)
EGFR (Non-African Amer.): >60
Glucose: 140 mg/dL — ABNORMAL HIGH (ref 65–99)
Potassium: 3.7 mmol/L (ref 3.5–5.1)
SGOT(AST): 48 U/L — ABNORMAL HIGH (ref 15–37)
SGPT (ALT): 50 U/L
Total Protein: 7.8 g/dL (ref 6.4–8.2)

## 2011-11-10 LAB — LIPASE, BLOOD: Lipase: 74 U/L (ref 73–393)

## 2011-11-10 LAB — CBC
HCT: 39.9 % (ref 35.0–47.0)
HGB: 13.1 g/dL (ref 12.0–16.0)
MCH: 27.3 pg (ref 26.0–34.0)
MCV: 83 fL (ref 80–100)
Platelet: 257 10*3/uL (ref 150–440)
RBC: 4.8 10*6/uL (ref 3.80–5.20)
WBC: 6.4 10*3/uL (ref 3.6–11.0)

## 2011-11-10 LAB — CK TOTAL AND CKMB (NOT AT ARMC)
CK, Total: 216 U/L — ABNORMAL HIGH (ref 21–215)
CK, Total: 105 U/L (ref 21–215)
CK-MB: 6.9 ng/mL — ABNORMAL HIGH (ref 0.5–3.6)
CK-MB: 3.8 ng/mL — ABNORMAL HIGH (ref 0.5–3.6)

## 2011-11-10 LAB — TROPONIN I
Troponin-I: <0.02 ng/mL
Troponin-I: <0.02 ng/mL
Troponin-I: <0.02 ng/mL

## 2011-11-11 LAB — COMPREHENSIVE METABOLIC PANEL
Albumin: 3.4 g/dL (ref 3.4–5.0)
Anion Gap: 10 (ref 7–16)
Bilirubin,Total: 1.1 mg/dL — ABNORMAL HIGH (ref 0.2–1.0)
Calcium, Total: 8.7 mg/dL (ref 8.5–10.1)
Chloride: 99 mmol/L (ref 98–107)
Creatinine: 0.61 mg/dL (ref 0.60–1.30)
EGFR (African American): >60
Glucose: 102 mg/dL — ABNORMAL HIGH (ref 65–99)
Potassium: 3.9 mmol/L (ref 3.5–5.1)
SGOT(AST): 48 U/L — ABNORMAL HIGH (ref 15–37)
Total Protein: 7.7 g/dL (ref 6.4–8.2)

## 2011-11-11 LAB — HEMOGLOBIN A1C: Hemoglobin A1C: 6.3 % (ref 4.2–6.3)

## 2011-11-11 LAB — CBC WITH DIFFERENTIAL/PLATELET
Basophil #: 0.1 10*3/uL (ref 0.0–0.1)
Eosinophil %: 0.9 %
Monocyte #: 0.9 10*3/uL — ABNORMAL HIGH (ref 0.0–0.7)
Monocyte %: 10.4 %
Neutrophil %: 68.9 %
Platelet: 247 10*3/uL (ref 150–440)
RBC: 4.87 10*6/uL (ref 3.80–5.20)
WBC: 8.3 10*3/uL (ref 3.6–11.0)

## 2011-11-11 LAB — BILIRUBIN, DIRECT: Bilirubin, Direct: 0.2 mg/dL (ref 0.00–0.20)

## 2011-11-12 LAB — CBC WITH DIFFERENTIAL/PLATELET
Basophil #: 0 10*3/uL (ref 0.0–0.1)
Basophil %: 0.4 %
Eosinophil: 5 %
HCT: 37.9 % (ref 35.0–47.0)
HGB: 12.4 g/dL (ref 12.0–16.0)
Lymphocyte #: 2.1 10*3/uL (ref 1.0–3.6)
Lymphocyte %: 34.3 %
Lymphocytes: 15 %
MCHC: 32.6 g/dL (ref 32.0–36.0)
MCV: 84 fL (ref 80–100)
Monocyte %: 10.7 %
Monocytes: 15 %
Neutrophil #: 3.1 10*3/uL (ref 1.4–6.5)
Neutrophil %: 49.5 %
Platelet: 235 10*3/uL (ref 150–440)
RBC: 4.51 10*6/uL (ref 3.80–5.20)
RDW: 14.4 % (ref 11.5–14.5)
WBC: 6.2 10*3/uL (ref 3.6–11.0)

## 2011-11-12 LAB — COMPREHENSIVE METABOLIC PANEL
Anion Gap: 14 (ref 7–16)
BUN: 10 mg/dL (ref 7–18)
Calcium, Total: 8.6 mg/dL (ref 8.5–10.1)
Co2: 27 mmol/L (ref 21–32)
Creatinine: 0.56 mg/dL — ABNORMAL LOW (ref 0.60–1.30)
EGFR (African American): >60
Glucose: 84 mg/dL (ref 65–99)
Osmolality: 283 (ref 275–301)
Sodium: 143 mmol/L (ref 136–145)

## 2011-11-12 LAB — HEPATIC FUNCTION PANEL A (ARMC)
Albumin: 3.3 g/dL — ABNORMAL LOW (ref 3.4–5.0)
Albumin: 3.4 g/dL (ref 3.4–5.0)
Alkaline Phosphatase: 88 U/L (ref 50–136)
Alkaline Phosphatase: 84 U/L (ref 50–136)
Bilirubin, Direct: 0.1 mg/dL (ref 0.00–0.20)
SGOT(AST): 46 U/L — ABNORMAL HIGH (ref 15–37)
SGOT(AST): 46 U/L — ABNORMAL HIGH (ref 15–37)
SGPT (ALT): 41 U/L
SGPT (ALT): 44 U/L
Total Protein: 7.5 g/dL (ref 6.4–8.2)

## 2011-11-12 LAB — LIPASE, BLOOD
Lipase: 65 U/L — ABNORMAL LOW (ref 73–393)
Lipase: 78 U/L (ref 73–393)

## 2011-11-13 LAB — CBC WITH DIFFERENTIAL/PLATELET
Basophil #: 0 10*3/uL (ref 0.0–0.1)
Basophil %: 0.2 %
Comment - H1-Com1: NORMAL
Comment - H1-Com2: NORMAL
Eosinophil #: 0 10*3/uL (ref 0.0–0.7)
Eosinophil %: 0 %
HCT: 35.9 % (ref 35.0–47.0)
HGB: 11.8 g/dL — ABNORMAL LOW (ref 12.0–16.0)
Lymphocyte #: 1 10*3/uL (ref 1.0–3.6)
Lymphocyte %: 9.1 %
MCHC: 32.8 g/dL (ref 32.0–36.0)
MCV: 83 fL (ref 80–100)
Monocyte %: 8.9 %
Monocytes: 8 %
Neutrophil #: 9.3 10*3/uL — ABNORMAL HIGH (ref 1.4–6.5)
RBC: 4.31 10*6/uL (ref 3.80–5.20)
RDW: 13.6 % (ref 11.5–14.5)
Segmented Neutrophils: 80 %
WBC: 11.4 10*3/uL — ABNORMAL HIGH (ref 3.6–11.0)

## 2011-11-13 LAB — HEPATIC FUNCTION PANEL A (ARMC): Bilirubin, Direct: 0.2 mg/dL (ref 0.00–0.20)

## 2011-11-13 LAB — COMPREHENSIVE METABOLIC PANEL
Alkaline Phosphatase: 82 U/L (ref 50–136)
Anion Gap: 10 (ref 7–16)
BUN: 9 mg/dL (ref 7–18)
Bilirubin,Total: 1 mg/dL (ref 0.2–1.0)
Calcium, Total: 8.4 mg/dL — ABNORMAL LOW (ref 8.5–10.1)
Chloride: 103 mmol/L (ref 98–107)
Co2: 23 mmol/L (ref 21–32)
Creatinine: 0.51 mg/dL — ABNORMAL LOW (ref 0.60–1.30)
EGFR (African American): >60
EGFR (Non-African Amer.): >60
Osmolality: 272 (ref 275–301)
Potassium: 3.6 mmol/L (ref 3.5–5.1)
SGPT (ALT): 42 U/L
Sodium: 136 mmol/L (ref 136–145)
Total Protein: 7 g/dL (ref 6.4–8.2)

## 2011-11-14 LAB — CBC WITH DIFFERENTIAL/PLATELET
Bands: 2 %
Basophil #: 0 10*3/uL (ref 0.0–0.1)
Basophil %: 0.4 %
Comment - H1-Com2: NORMAL
Eosinophil %: 0.9 %
Eosinophil: 1 %
HCT: 35.1 % (ref 35.0–47.0)
HGB: 11.6 g/dL — ABNORMAL LOW (ref 12.0–16.0)
Lymphocyte #: 1.6 10*3/uL (ref 1.0–3.6)
MCH: 27.5 pg (ref 26.0–34.0)
MCHC: 33 g/dL (ref 32.0–36.0)
MCV: 83 fL (ref 80–100)
Monocyte %: 8 %
Neutrophil #: 7.3 10*3/uL — ABNORMAL HIGH (ref 1.4–6.5)
Neutrophil %: 74.5 %
Platelet: 251 10*3/uL (ref 150–440)
RBC: 4.22 10*6/uL (ref 3.80–5.20)
RDW: 14 % (ref 11.5–14.5)
Segmented Neutrophils: 69 %
Variant Lymphocyte - H1-Rlymph: 2 %
WBC: 9.8 10*3/uL (ref 3.6–11.0)

## 2011-11-14 LAB — LIPASE, BLOOD: Lipase: 141 U/L (ref 73–393)

## 2011-11-14 LAB — COMPREHENSIVE METABOLIC PANEL
Bilirubin,Total: 1.3 mg/dL — ABNORMAL HIGH (ref 0.2–1.0)
Calcium, Total: 8.3 mg/dL — ABNORMAL LOW (ref 8.5–10.1)
Chloride: 104 mmol/L (ref 98–107)
Co2: 23 mmol/L (ref 21–32)
Creatinine: 0.59 mg/dL — ABNORMAL LOW (ref 0.60–1.30)
EGFR (African American): >60
EGFR (Non-African Amer.): >60
SGPT (ALT): 34 U/L

## 2011-11-14 LAB — AMYLASE: Amylase: 49 U/L (ref 25–115)

## 2011-11-16 LAB — COMPREHENSIVE METABOLIC PANEL
Albumin: 2.9 g/dL — ABNORMAL LOW (ref 3.4–5.0)
Alkaline Phosphatase: 84 U/L (ref 50–136)
BUN: 6 mg/dL — ABNORMAL LOW (ref 7–18)
Calcium, Total: 9 mg/dL (ref 8.5–10.1)
EGFR (African American): >60
Glucose: 98 mg/dL (ref 65–99)
SGOT(AST): 34 U/L (ref 15–37)

## 2011-11-17 LAB — BASIC METABOLIC PANEL
Anion Gap: 14 (ref 7–16)
Calcium, Total: 8.8 mg/dL (ref 8.5–10.1)
Chloride: 97 mmol/L — ABNORMAL LOW (ref 98–107)
Co2: 25 mmol/L (ref 21–32)
Creatinine: 0.55 mg/dL — ABNORMAL LOW (ref 0.60–1.30)
EGFR (African American): >60
EGFR (Non-African Amer.): >60
Glucose: 103 mg/dL — ABNORMAL HIGH (ref 65–99)

## 2011-11-17 LAB — CBC WITH DIFFERENTIAL/PLATELET
Basophil #: 0 10*3/uL (ref 0.0–0.1)
Basophil %: 0.4 %
Eosinophil %: 1.7 %
HCT: 38.8 % (ref 35.0–47.0)
HGB: 13 g/dL (ref 12.0–16.0)
Lymphocyte #: 1.2 10*3/uL (ref 1.0–3.6)
Lymphocyte %: 12.8 %
MCV: 82 fL (ref 80–100)
Monocyte #: 0.7 10*3/uL (ref 0.0–0.7)
Monocyte %: 7.7 %
Neutrophil %: 77.4 %
Platelet: 373 10*3/uL (ref 150–440)
RBC: 4.71 10*6/uL (ref 3.80–5.20)
RDW: 14 % (ref 11.5–14.5)
WBC: 9.5 10*3/uL (ref 3.6–11.0)

## 2011-11-24 ENCOUNTER — Encounter: Payer: Self-pay | Admitting: Family Medicine

## 2012-06-29 ENCOUNTER — Encounter: Payer: Self-pay | Admitting: Family Medicine

## 2012-06-29 ENCOUNTER — Ambulatory Visit: Payer: Self-pay | Admitting: Family Medicine

## 2012-09-16 ENCOUNTER — Ambulatory Visit (INDEPENDENT_AMBULATORY_CARE_PROVIDER_SITE_OTHER)
Admission: RE | Admit: 2012-09-16 | Discharge: 2012-09-16 | Disposition: A | Discharge status: Home / Self Care | Payer: 59 | Source: Ambulatory Visit | Attending: Family Medicine | Admitting: Family Medicine

## 2012-09-16 ENCOUNTER — Ambulatory Visit (INDEPENDENT_AMBULATORY_CARE_PROVIDER_SITE_OTHER): Payer: 59 | Admitting: Family Medicine

## 2012-09-16 ENCOUNTER — Encounter: Payer: Self-pay | Admitting: Family Medicine

## 2012-09-16 VITALS — BP 126/68 | HR 104 | Temp 98.5°F | Ht 66.0 in | Wt 191.5 lb

## 2012-09-16 DIAGNOSIS — IMO0001 Reserved for inherently not codable concepts without codable children: Secondary | ICD-10-CM | POA: Insufficient documentation

## 2012-09-16 DIAGNOSIS — R609 Edema, unspecified: Secondary | ICD-10-CM | POA: Insufficient documentation

## 2012-09-16 DIAGNOSIS — R350 Frequency of micturition: Secondary | ICD-10-CM

## 2012-09-16 DIAGNOSIS — R0602 Shortness of breath: Secondary | ICD-10-CM

## 2012-09-16 DIAGNOSIS — R600 Localized edema: Secondary | ICD-10-CM

## 2012-09-16 LAB — POCT URINALYSIS DIPSTICK
Protein, UA: NEGATIVE
Spec Grav, UA: 1.015
Urobilinogen, UA: 0.2
pH, UA: 6

## 2012-09-16 NOTE — Progress Notes (Signed)
Subjective:    Patient ID: [name] Branch, female    DOB: 1956/01/31, 56 y.o.   MRN: [id number]  HPI Here for edema "getting fluid in various places in her body" Some discomfort in her ribs Some trouble catching her breath- starting this am -- with a panic feeling like cannot get enough air  Pulse ox 97% -today   Wt is up 4 lb In ankles and her feet Thighs and her arms too  ? Right breast     No hx of heart dz known  Has had her gallbladder removed  Had an echocardiogram in the hospital -none since == it was fine   No new illness- no fever  Does have a cold -nonprod cough   Also urine symptoms Some more frequency and urgency than usual and achiness of bladder   Patient Active Problem List  Diagnosis  . PURE HYPERCHOLESTEROLEMIA  . CONSTIPATION, CHRONIC  . Irritable bowel syndrome  . CHOLELITHIASIS  . POSTMENOPAUSAL STATUS  . VAGINITIS, ATROPHIC  . Pain in Soft Tissues of Limb  . Diarrhea  . FREQUENCY, URINARY  . Vomiting  . Tachycardia - pulse   Past Medical History  Diagnosis Date  . 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 Dr. Neldon Mc   . Allergic rhinitis   . Migraine   . Chronic gastritis   . Hyperlipidemia   . West Nile virus infection 2003  . Lyme disease     "stari"   Past Surgical History  Procedure Date  . Tonsillectomy   . Partial hysterectomy     bleeding  . Ulnar nerve repair     sx times 5  . Laprascopy   . Breast biopsy '04    neg; right  . 5th r toe fx '09  . Upper gastrointestinal endoscopy 12/04/2004    gastritis   History  Substance Use Topics  . Smoking status: Never Smoker   . Smokeless tobacco: Never Used     Comment: non smoker  . Alcohol Use: No   Family History  Problem Relation Age of Onset  . Bipolar disorder      some family  . Colon cancer Neg Hx   . Breast cancer Paternal Grandmother   . Diabetes Father   . Hypertension Father   . Stroke Father     . Hypertension Mother   . Cancer Mother     Darrick Grinder  . Alcohol abuse Maternal Grandfather   . Ulcers Mother    Allergies  Allergen Reactions  . Sulfonamide Derivatives    Current Outpatient Prescriptions on File Prior to Visit  Medication Sig Dispense Refill  . Cholecalciferol (VITAMIN D3) 5000 UNITS TABS Take 1 tablet by mouth daily.        Marland Kitchen DHEA 25 MG CAPS Take 1 capsule by mouth daily.        Marland Kitchen ibuprofen (ADVIL,MOTRIN) 200 MG tablet Take 200 mg by mouth every 6 (six) hours as needed.        . modafinil (PROVIGIL) 100 MG tablet Take 100 mg by mouth as needed.        Marland Kitchen morphine (MSIR) 30 MG tablet Take 30 mg by mouth every 4 (four) hours as needed. UAD PRN       . Multiple Vitamin (MULTIVITAMIN) tablet Take 1 tablet by mouth daily.        . promethazine (PHENERGAN) 25 MG tablet TAKE 1 TABLET BY MOUTH EVERY 6  HOURS AS NEEDED FOR NAUSEA  30 tablet  0        Review of Systems Review of Systems  Constitutional: Negative for fever, appetite change, and unexpected weight change. pos for baseline fatigue  Eyes: Negative for pain and visual disturbance.  Respiratory: Negative for cough and wheeze  Cardiovascular: Negative for cp or palpitations   neg for PND or orthopnea, pos for edema Gastrointestinal: Negative for nausea, diarrhea and constipation.  Genitourinary: Negative for urgency and dysuria, neg for blood in stool Skin: Negative for pallor or rash   Neurological: Negative for weakness, light-headedness, numbness and headaches.  Hematological: Negative for adenopathy. Does not bruise/bleed easily.  Psychiatric/Behavioral: Negative for dysphoric mood. The patient is not nervous/anxious.         Objective:   Physical Exam  Constitutional: She appears well-developed and well-nourished. No distress.       Pt seems fatigued Walks somewhat hunched over   HENT:  Head: Normocephalic and atraumatic.  Mouth/Throat: Oropharynx is clear and moist.  Eyes: Conjunctivae normal  and EOM are normal. Pupils are equal, round, and reactive to light. Right eye exhibits no discharge. Left eye exhibits no discharge.  Neck: Normal range of motion. Neck supple. No JVD present. Carotid bruit is not present. No thyromegaly present.  Cardiovascular: Normal rate, regular rhythm, normal heart sounds and intact distal pulses.  Exam reveals no gallop.   Pulmonary/Chest: Effort normal and breath sounds normal. No respiratory distress. She has no wheezes.  Abdominal: Soft. Bowel sounds are normal. She exhibits no distension, no abdominal bruit and no mass. There is no tenderness.  Musculoskeletal: She exhibits no edema and no tenderness.       No edema appreciated on exam, no pitting  No palp cords in legs Neg homann's sign  Lymphadenopathy:    She has no cervical adenopathy.  Neurological: She is alert. She has normal reflexes. No cranial nerve deficit. She exhibits normal muscle tone. Coordination normal.  Skin: Skin is warm and dry. No rash noted. No erythema. No pallor.  Psychiatric: Her speech is normal. She is slowed. She is attentive.          Assessment & Plan:

## 2012-09-16 NOTE — Patient Instructions (Addendum)
Labs today  Will update you with radiology report  Give urine specimen on way out also  Drink water/ avoid salt Elevate feet whenever you can

## 2012-09-18 LAB — CBC WITH DIFFERENTIAL/PLATELET
Basophils Absolute: 0.1 10*3/uL (ref 0.0–0.2)
Immature Granulocytes: 0 % (ref 0–2)
Lymphocytes Absolute: 2.5 10*3/uL (ref 0.7–3.1)
Lymphs: 32 % (ref 14–46)
MCHC: 32.8 g/dL (ref 31.5–35.7)
Monocytes: 9 % (ref 4–12)
RDW: 13.7 % (ref 12.3–15.4)

## 2012-09-18 LAB — URINE CULTURE: Colony Count: 45000

## 2012-09-18 LAB — COMPREHENSIVE METABOLIC PANEL
ALT: 34 IU/L — ABNORMAL HIGH (ref 0–32)
AST: 37 IU/L (ref 0–40)
Albumin/Globulin Ratio: 1.6 (ref 1.1–2.5)
Alkaline Phosphatase: 131 IU/L — ABNORMAL HIGH (ref 39–117)
BUN/Creatinine Ratio: 18 (ref 9–23)
CO2: 24 mmol/L (ref 19–28)
Calcium: 9.7 mg/dL (ref 8.7–10.2)
Potassium: 4.5 mmol/L (ref 3.5–5.2)
Sodium: 136 mmol/L (ref 134–144)

## 2012-09-18 LAB — BRAIN NATRIURETIC PEPTIDE: BNP: <2.5 pg/mL (ref 0.0–100.0)

## 2012-09-18 NOTE — Assessment & Plan Note (Signed)
With reassuring exam cxr looks clear -pend rad review  Nl pulse ox  Tachycardia is baseline in the office

## 2012-09-18 NOTE — Assessment & Plan Note (Signed)
This was not appreciated on exam today Per pt she feels swollen all over Lab today No acute changes on EKG and pending rad rev of cxr that appeared to be normal

## 2012-09-18 NOTE — Assessment & Plan Note (Signed)
ua had some leukocytes- specimen sent for culture

## 2012-09-19 ENCOUNTER — Telehealth: Payer: Self-pay | Admitting: Family Medicine

## 2012-09-19 DIAGNOSIS — R748 Abnormal levels of other serum enzymes: Secondary | ICD-10-CM | POA: Insufficient documentation

## 2012-09-19 NOTE — Telephone Encounter (Signed)
Message copied by Judy Pimple on Mon Sep 19, 2012  5:08 PM ------      Message from: Blenda Mounts M      Created: Mon Sep 19, 2012  4:04 PM       Before I call [name] Branch I checked with Terri and to add the alk phos isoenzymes test [name] Branch would need another draw, please advise

## 2012-09-19 NOTE — Telephone Encounter (Signed)
Ok - please let her know and I will order those labs for future

## 2012-09-20 ENCOUNTER — Telehealth: Payer: Self-pay | Admitting: Family Medicine

## 2012-09-20 NOTE — Telephone Encounter (Signed)
Error

## 2012-09-26 ENCOUNTER — Other Ambulatory Visit: Payer: Self-pay | Admitting: Family Medicine

## 2012-09-29 LAB — URINE CULTURE

## 2012-09-29 LAB — ALKALINE PHOSPHATASE, ISOENZYMES
Alkaline Phosphatase: 132 IU/L — ABNORMAL HIGH (ref 39–117)
BONE FRACTION: 43 % (ref 11–68)
INTESTINAL FRAC.: 14 % (ref 0–16)

## 2012-09-30 ENCOUNTER — Telehealth: Payer: Self-pay

## 2012-09-30 NOTE — Telephone Encounter (Signed)
Left voicemail requesting pt to call office, will try to call later 

## 2012-09-30 NOTE — Telephone Encounter (Signed)
Rx called to phrm.

## 2012-09-30 NOTE — Telephone Encounter (Signed)
Pt request results from labs done at [facility] recently. Pt said [facility] told pt could not put into our system but would fax labs to Dr Milinda Antis. Did Dr Milinda Antis receive labs?

## 2012-09-30 NOTE — Telephone Encounter (Signed)
Pt notified of lab results and that Rx was sent to pharm. And a copy of labs were mailed to pt

## 2012-09-30 NOTE — Telephone Encounter (Signed)
Alk phos test is reassuring - the isoenzymes are in normal range  Urine cx shows some ecoli (small amt) and some contaminants also  Looks like it is sensitive to cipro Please call in cipro 250 mg 1 po bid # 10 no refils Make sure to drink your water

## 2012-09-30 NOTE — Telephone Encounter (Signed)
Left voicemail requesting pt to call office, will try to call again, mailed copy of lab results to pt

## 2012-11-22 ENCOUNTER — Telehealth: Payer: Self-pay | Admitting: Family Medicine

## 2012-11-22 NOTE — Telephone Encounter (Signed)
Will see her tomorrow if she can get in with weather

## 2012-11-22 NOTE — Telephone Encounter (Signed)
Patient Information:  Caller Name: Makailah  Phone: [phone]  Patient: [name] Branch, [name] Branch  Gender: Female  DOB: September 08, 1956  Age: 57 Years  PCP: [facility] [employer])  Office Follow Up:  Does the office need to follow up with this patient?: No  Instructions For The Office: N/A  RN Note:  Thinks she would like to come in tomorrow but wanting to wait until am for calling for OV  Symptoms  Reason For Call & Symptoms: cough, congestion started with intermittent fever.  After wheezing and coughing up colored mucus 1/25 was seen in [facility] and started ZPak by dinner time.  Cough continues, lot of congestion, sometimes feeling not getting enough air in Right lung. Sleeping sittiing up, wheeze at intervals but not since this am 1/28  Reviewed Health History In EMR: Yes  Reviewed Medications In EMR: Yes  Reviewed Allergies In EMR: Yes  Reviewed Surgeries / Procedures: Yes  Date of Onset of Symptoms: 11/12/2012  Treatments Tried: ZPak started 11/19/2012, Guaifenesin w/decongestant OTC  Treatments Tried Worked: Yes  Guideline(s) Used:  Cough  Disposition Per Guideline:   See Today or Tomorrow in Office  Reason For Disposition Reached:   Continuous (nonstop) coughing interferes with work or school and no improvement using cough treatment per Care Advice  Advice Given:  Coughing Spasms:  Drink warm fluids. Inhale warm mist (Reason: both relax the airway and loosen up the phlegm).  Suck on cough drops or hard candy to coat the irritated throat.  Prevent Dehydration:  Drink adequate liquids.  This will help soothe an irritated or dry throat and loosen up the phlegm.  Call Back If:  Difficulty breathing occurs  You become worse

## 2013-01-17 ENCOUNTER — Ambulatory Visit (INDEPENDENT_AMBULATORY_CARE_PROVIDER_SITE_OTHER): Payer: 59 | Admitting: Family Medicine

## 2013-01-17 ENCOUNTER — Encounter: Payer: Self-pay | Admitting: Family Medicine

## 2013-01-17 ENCOUNTER — Ambulatory Visit (INDEPENDENT_AMBULATORY_CARE_PROVIDER_SITE_OTHER)
Admission: RE | Admit: 2013-01-17 | Discharge: 2013-01-17 | Disposition: A | Discharge status: Home / Self Care | Payer: 59 | Source: Ambulatory Visit | Attending: Family Medicine | Admitting: Family Medicine

## 2013-01-17 VITALS — BP 112/82 | HR 109 | Temp 99.0°F

## 2013-01-17 DIAGNOSIS — J019 Acute sinusitis, unspecified: Secondary | ICD-10-CM

## 2013-01-17 DIAGNOSIS — R042 Hemoptysis: Secondary | ICD-10-CM

## 2013-01-17 DIAGNOSIS — B9689 Other specified bacterial agents as the cause of diseases classified elsewhere: Secondary | ICD-10-CM

## 2013-01-17 MED ORDER — AMOXICILLIN-POT CLAVULANATE 875-125 MG PO TABS: 1.0000 | ORAL_TABLET | Freq: Two times a day (BID) | ORAL | Status: DC

## 2013-01-17 NOTE — Progress Notes (Signed)
Subjective:    Patient ID: [name] Branch, female    DOB: 1955/11/30, 57 y.o.   MRN: [id number]  HPI Here with symptom of coughing up blood  Has been retaining fluid - as usual   January- went to urgent care with uri symptoms / cough and sob with phlegm Was given an antibiotic (zpak) She slowly got better - but had green nasal and chest cong  Now is starting to cough up some blood - only when she coughs very very deep  Still green mucous/ with dark red blood  occ trouble getting a good deep breath - makes her feel panicky No wheeze  Low grade fever at home - around 100   Her temp usually runs a little low   No facial pain  Is blowing out purulent drainage from her nose  Throat is sore today - that comes and goes  Ears feel stopped up (intermittent as well )  - crinkly sound   Patient Active Problem List  Diagnosis  . PURE HYPERCHOLESTEROLEMIA  . CONSTIPATION, CHRONIC  . Irritable bowel syndrome  . CHOLELITHIASIS  . POSTMENOPAUSAL STATUS  . VAGINITIS, ATROPHIC  . Pain in Soft Tissues of Limb  . Diarrhea  . FREQUENCY, URINARY  . Vomiting  . Tachycardia - pulse  . Shortness of breath dyspnea  . Pedal edema  . Elevated alkaline phosphatase level   Past Medical History  Diagnosis Date  . 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 Dr. Neldon Mc   . Allergic rhinitis   . Migraine   . Chronic gastritis   . Hyperlipidemia   . West Nile virus infection 2003  . Lyme disease     "stari"   Past Surgical History  Procedure Laterality Date  . Tonsillectomy    . Partial hysterectomy      bleeding  . Ulnar nerve repair      sx times 5  . Laprascopy    . Breast biopsy  '04    neg; right  . 5th r toe fx  '09  . Upper gastrointestinal endoscopy  12/04/2004    gastritis   History  Substance Use Topics  . Smoking status: Never Smoker   . Smokeless tobacco: Never Used     Comment: non smoker  . Alcohol Use: No    Family History  Problem Relation Age of Onset  . Bipolar disorder      some family  . Colon cancer Neg Hx   . Breast cancer Paternal Grandmother   . Diabetes Father   . Hypertension Father   . Stroke Father   . Hypertension Mother   . Cancer Mother     Darrick Grinder  . Alcohol abuse Maternal Grandfather   . Ulcers Mother    Allergies  Allergen Reactions  . Sulfonamide Derivatives    Current Outpatient Prescriptions on File Prior to Visit  Medication Sig Dispense Refill  . Cholecalciferol (VITAMIN D3) 5000 UNITS TABS Take 1 tablet by mouth daily.        . modafinil (PROVIGIL) 100 MG tablet Take 100 mg by mouth as needed.        Marland Kitchen morphine (MSIR) 30 MG tablet Take 30 mg by mouth every 4 (four) hours as needed. UAD PRN       . Multiple Vitamin (MULTIVITAMIN) tablet Take 1 tablet by mouth daily.        . promethazine (PHENERGAN) 25 MG tablet TAKE 1  TABLET BY MOUTH EVERY 6 HOURS AS NEEDED FOR NAUSEA  30 tablet  0  . DHEA 25 MG CAPS Take 1 capsule by mouth daily.         No current facility-administered medications on file prior to visit.      Review of Systems Review of Systems  Constitutional: Negative for  appetite change, and unexpected weight change.  ENT pos for sinus pain and nasal cong and ST Eyes: Negative for pain and visual disturbance.  Respiratory: Negative for wheeze and shortness of breath.   Cardiovascular: Negative for cp or palpitations    Gastrointestinal: Negative for nausea, diarrhea and constipation.  Genitourinary: Negative for urgency and frequency.  Skin: Negative for pallor or rash   Neurological: Negative for weakness, light-headedness, numbness and headaches.  Hematological: Negative for adenopathy. Does not bruise/bleed easily.  Psychiatric/Behavioral: Negative for dysphoric mood. The patient is not nervous/anxious.         Objective:   Physical Exam  Constitutional: She appears well-developed and well-nourished. No distress.  HENT:  Head:  Normocephalic and atraumatic.  Right Ear: External ear normal.  Left Ear: External ear normal.  Mouth/Throat: Oropharynx is clear and moist. No oropharyngeal exudate.  Nares are injected and congested  Bilateral maxillary sinus tenderness TMs are dull  Eyes: Conjunctivae and EOM are normal. Pupils are equal, round, and reactive to light. Right eye exhibits no discharge. Left eye exhibits no discharge. No scleral icterus.  Neck: Normal range of motion. Neck supple.  Cardiovascular: Regular rhythm, normal heart sounds and intact distal pulses.   Rate in mid 90s at rest  Pulmonary/Chest: Effort normal and breath sounds normal. No respiratory distress. She has no wheezes. She has no rales. She exhibits no tenderness.  Musculoskeletal: She exhibits no edema.  Lymphadenopathy:    She has no cervical adenopathy.  Neurological: She is alert. She has normal reflexes.  Skin: Skin is warm and dry. No rash noted. No erythema. No pallor.  Psychiatric: She has a normal mood and affect.          Assessment & Plan:

## 2013-01-17 NOTE — Assessment & Plan Note (Signed)
S/p several mo of uri  Given px for augmentin  Disc symptomatic care - see instructions on AVS  Update if not starting to improve in a week or if worsening   Also getting cxr today for hemoptysis

## 2013-01-17 NOTE — Assessment & Plan Note (Signed)
After cough and uri for several months  cxr today

## 2013-01-17 NOTE — Patient Instructions (Addendum)
Hold the augmentin px until we call you about the xray  I think you are dealing with a sinus infection  mucinex may help with congestion and drink lots of fluids  Update if not starting to improve in a week or if worsening

## 2013-01-18 ENCOUNTER — Telehealth: Payer: Self-pay | Admitting: Family Medicine

## 2013-01-18 NOTE — Telephone Encounter (Signed)
Hold the augmentin tonight - this could be cause of the vomiting - I need to know that this stops before px something else I do not know why she is swelling- of course we need to make sure it is not a blood clot - so that is why the nurse on call recommended ER visit - if this worsens over night she should go to ER and if not imp tomorrow make appt with first avail doc (I am not here tomorrow)  Let me know (I will check computer) how the vomiting is tomorrow

## 2013-01-18 NOTE — Telephone Encounter (Signed)
Patient Information:  Caller Name: Alajah  Phone: [phone]  Patient: [name] Branch, [name] Branch  Gender: Female  DOB: 12/14/1955  Age: 57 Years  PCP: Roxy Manns [facility])  Office Follow Up:  Does the office need to follow up with this patient?: Yes  Instructions For The Office: Please call regarding RX substitution.  RN Note:  Seen in office 01/17/13; started on Augmentin 2230 01/17/13 for sinusitis with hemoptysis.  Today, 01/18/13, vomited X 2 and had 6-7 stools. Intermittent nausea.  Drinking fluids and voiding. Did not take morning dose of Augmentin. Fever present.  Chronic generalized edema; Unable to walk today due to right foot is very swollen and increasing pain. Painful to dorsiflex right foot. Both feet feel warm to touch.  No discoloration of right leg.  [facility].   Symptoms  Reason For Call & Symptoms: Mild vomitng and moderate diarrhea; On Augmentin since 01/17/13 for acute sinusitis and hemoptysis.  Asking if MD wants to change antibiotic or this is a viral infection.  Reviewed Health History In EMR: Yes  Reviewed Medications In EMR: Yes  Reviewed Allergies In EMR: Yes  Reviewed Surgeries / Procedures: Yes  Date of Onset of Symptoms: 01/18/2013  Treatments Tried: Withheld morning dose of Augmentin, Phenergan  Treatments Tried Worked: No  Any Fever: Yes  Fever Taken: Ear Thermometer  Fever Time Of Reading: 14:20:00  Fever Last Reading: 100.5  Guideline(s) Used:  Vomiting  Disposition Per Guideline:   Go to Office Now  Reason For Disposition Reached:   Fever > 100.5 F (38.1 C) and has a weak immune system (e.g., HIV positive, cancer chemo, organ transplant, splenectomy, chronic steroids)  Advice Given:  N/A  Patient Refused Recommendation:  Patient Requests Prescription  Has no transportation to get to office and refuses to go to ED now;  not afraid to call 911 if symptoms worsen.  Asking if Augmentin should be changed due to mild vomiting and diarrhea.   No emesis since late morning.

## 2013-01-18 NOTE — Telephone Encounter (Signed)
Pt notified of Dr. Royden Purl comments/ recommendations, pt verbalized understanding

## 2013-01-19 ENCOUNTER — Ambulatory Visit (INDEPENDENT_AMBULATORY_CARE_PROVIDER_SITE_OTHER): Payer: 59 | Admitting: Family Medicine

## 2013-01-19 ENCOUNTER — Encounter: Payer: Self-pay | Admitting: Family Medicine

## 2013-01-19 VITALS — BP 120/60 | HR 115 | Temp 98.3°F | Ht 66.0 in | Wt 193.5 lb

## 2013-01-19 DIAGNOSIS — R609 Edema, unspecified: Secondary | ICD-10-CM

## 2013-01-19 DIAGNOSIS — G9332 Myalgic encephalomyelitis/chronic fatigue syndrome: Secondary | ICD-10-CM

## 2013-01-19 DIAGNOSIS — J209 Acute bronchitis, unspecified: Secondary | ICD-10-CM

## 2013-01-19 DIAGNOSIS — R042 Hemoptysis: Secondary | ICD-10-CM

## 2013-01-19 DIAGNOSIS — M79609 Pain in unspecified limb: Secondary | ICD-10-CM

## 2013-01-19 DIAGNOSIS — R1110 Vomiting, unspecified: Secondary | ICD-10-CM

## 2013-01-19 DIAGNOSIS — R5382 Chronic fatigue, unspecified: Secondary | ICD-10-CM

## 2013-01-19 MED ORDER — AZITHROMYCIN 250 MG PO TABS: ORAL_TABLET | ORAL | Status: DC

## 2013-01-19 MED ORDER — PROMETHAZINE HCL 25 MG PO TABS: 25.0000 mg | ORAL_TABLET | Freq: Four times a day (QID) | ORAL | Status: DC | PRN

## 2013-01-19 NOTE — Patient Instructions (Addendum)
Push fluids. Return to healthy foods as soon as you are able to help diarrhea resovle. You do not have a blood clot. Continue phenergan for nausea. If cough not improving in 4-5 days, then you can fill repeat course of azithromycin. Call if not improving as expected. Go to ER if not tolreating any liquids.  Once feeling better exercise can help with diffuse swelling.

## 2013-01-19 NOTE — Progress Notes (Signed)
  Subjective:    Patient ID: [name] Branch, female    DOB: Dec 22, 1955, 57 y.o.   MRN: [id number]  HPI  57 year old female with chronic fatigue syndrome  pt of Dr. Lucretia Roers presents with multiple concerns. 8 months of swelling all over. Dx with acute bacterial sinus infection , failed treatment with azithromycin. Returned to see Dr. Karie Schwalbe 2 days ago and diagnosed with acute sinus infection.  She reports she had vomiting after second dose of augmentin. Dr. Milinda Antis stopped this yesterday on phone note.  Diarrhea and nausea since then.  Phenergan is helping with nausea.  She has been pushing fluids. Frequent watery non bloody stool. Some abdominal cramping when ready for BM.  Coughing up green mucus, dark color bloody discharge as well. No facial tenderness, ears feel full. She is most worried about new increase in swelling in right foot x 24 hours. Pain in right foot with walking or standing. No redness. No injury  Wt Readings from Last 3 Encounters:  01/19/13 193 lb 8 oz (87.771 kg)  09/16/12 191 lb 8 oz (86.864 kg)  09/30/11 187 lb (84.823 kg)    SOB occasional when lying down. No chest pain.  Nonsmoker. No respiratory history. Labs from 08/2012 showed nml TSH, slightly elevated AST, nml ALT, nml kidney function and no anemia  CXR nml 3/25. Review of Systems  Constitutional: Positive for fatigue. Negative for fever.  HENT: Negative for congestion.   Psychiatric/Behavioral: Negative for self-injury.  All other systems reviewed and are negative.       Objective:   Physical Exam  Constitutional: She appears well-developed and well-nourished.  HENT:  Head: Normocephalic.  Right Ear: A middle ear effusion is present.  Left Ear: A middle ear effusion is present.  Nose: No mucosal edema, rhinorrhea or nose lacerations. Right sinus exhibits no maxillary sinus tenderness and no frontal sinus tenderness. Left sinus exhibits no maxillary sinus tenderness and no frontal sinus tenderness.   Eyes: Conjunctivae, EOM and lids are normal. Pupils are equal, round, and reactive to light.  Neck: Normal range of motion. Neck supple. Carotid bruit is not present. No mass and no thyromegaly present.  Cardiovascular: Normal rate, regular rhythm, normal heart sounds and normal pulses.   No peripheral swelling B, no cord in calf, Neg Homan's test.  Pulmonary/Chest: Breath sounds normal. She has no decreased breath sounds. She has no wheezes. She has no rhonchi. She has no rales.  Abdominal: Soft. Normal appearance and bowel sounds are normal. There is no hepatosplenomegaly. There is tenderness in the left lower quadrant. There is no CVA tenderness.          Assessment & Plan:

## 2013-01-20 DIAGNOSIS — J209 Acute bronchitis, unspecified: Secondary | ICD-10-CM | POA: Insufficient documentation

## 2013-01-20 NOTE — Assessment & Plan Note (Signed)
Likely at least partially due to augemntin intolerance. Stay off this med. Possible viral GE as well. Push fluids. Return to healthy foods as soon as you are able to help diarrhea resovle.

## 2013-01-20 NOTE — Assessment & Plan Note (Signed)
Pt reports long term swelling. Reviewed last labs with pt that show nml thyroid, liver and kidney function as well as nml BNP.  No further work up needed at this time.  Swelling in this pt is very subjective... I do not observe any where she states she sees it. Recommended exercise and weight loss.

## 2013-01-20 NOTE — Assessment & Plan Note (Signed)
If cough not improving in 4-5 days, then you can fill repeat course of azithromycin.

## 2013-01-20 NOTE — Assessment & Plan Note (Signed)
Continued small amount but CXR 2 days ago clear. Likely from upper respiratory tract irritation.

## 2013-01-20 NOTE — Assessment & Plan Note (Signed)
No sign of unilateral swelling, redness.. No concern for DVT.

## 2013-01-31 ENCOUNTER — Telehealth: Payer: Self-pay | Admitting: Family Medicine

## 2013-01-31 NOTE — Telephone Encounter (Signed)
Patient Information:  Caller Name: Daniela  Phone: [phone]  Patient: [name] Branch, [name] Branch  Gender: Female  DOB: 1956-05-20  Age: 57 Years  PCP: Roxy Manns [facility])  Office Follow Up:  Does the office need to follow up with this patient?: Yes  Instructions For The Office: Contact patient to let her know if she can be worked into the schedule for today.  RN Note:  No appointments available on schedule. Please contact patient to let her know if she can be worked into the schedule today. She reports if there will be a very long wait for a work in she doesn't mind going to UC or ED for evaluation.   Symptoms  Reason For Call & Symptoms: Blood tinged sputum with productive cough. Reports shortness of breath when laying down or when she is coughing up mucus. Has completed 2 runs of antibiotics. Also reports swelling and pain to the right ankle. Reports excruciating pain when standing/walking. Pain is radiating up the leg.  Reviewed Health History In EMR: Yes  Reviewed Medications In EMR: Yes  Reviewed Allergies In EMR: Yes  Reviewed Surgeries / Procedures: Yes  Date of Onset of Symptoms: 01/17/2013  Treatments Tried: Zithromax, Augmentin  Treatments Tried Worked: No  Guideline(s) Used:  Cough  Disposition Per Guideline:   Go to Office Now  Reason For Disposition Reached:   Increasing ankle swelling  Advice Given:  N/A  Patient Will Follow Care Advice:  YES

## 2013-01-31 NOTE — Telephone Encounter (Signed)
Pt advise to go to UC or ED, pt verbalized understanding, pt did schedule an appt with Dr. Milinda Antis tomorrow just incase she doesn't make it to the UC today, if she does go to UC today pt will call back and cx appt tomorrow morning

## 2013-01-31 NOTE — Telephone Encounter (Signed)
I do not have anything left- if no one else does - would advise UC

## 2013-02-01 ENCOUNTER — Ambulatory Visit (INDEPENDENT_AMBULATORY_CARE_PROVIDER_SITE_OTHER)
Admission: RE | Admit: 2013-02-01 | Discharge: 2013-02-01 | Disposition: A | Discharge status: Home / Self Care | Payer: 59 | Source: Ambulatory Visit | Attending: Family Medicine | Admitting: Family Medicine

## 2013-02-01 ENCOUNTER — Encounter: Payer: Self-pay | Admitting: Family Medicine

## 2013-02-01 ENCOUNTER — Ambulatory Visit (INDEPENDENT_AMBULATORY_CARE_PROVIDER_SITE_OTHER): Payer: 59 | Admitting: Family Medicine

## 2013-02-01 VITALS — BP 100/70 | HR 89 | Temp 98.6°F | Ht 66.0 in | Wt 193.0 lb

## 2013-02-01 DIAGNOSIS — M25579 Pain in unspecified ankle and joints of unspecified foot: Secondary | ICD-10-CM

## 2013-02-01 DIAGNOSIS — M79609 Pain in unspecified limb: Secondary | ICD-10-CM

## 2013-02-01 DIAGNOSIS — M25571 Pain in right ankle and joints of right foot: Secondary | ICD-10-CM | POA: Insufficient documentation

## 2013-02-01 DIAGNOSIS — M79671 Pain in right foot: Secondary | ICD-10-CM

## 2013-02-01 DIAGNOSIS — J209 Acute bronchitis, unspecified: Secondary | ICD-10-CM

## 2013-02-01 MED ORDER — AZITHROMYCIN 250 MG PO TABS: ORAL_TABLET | ORAL | Status: DC

## 2013-02-01 NOTE — Progress Notes (Signed)
Subjective:    Patient ID: [name] Branch, female    DOB: 1956-01-19, 57 y.o.   MRN: [id number]  HPI Still coughing - over 2 weeks , nl cxr  Still coughs up a bit of blood occ-not every time Took one course of zithromax  Still some sinus symptoms - and colored mucous from nose and also chest  She is worried about poss parasites - vetrinary exposure - mentioned protazoans and nematoads  She is also exp to birds   No current animal infx in house right now  Overall has had symptoms in 8 months - off and on   Edema - is normal for her  Now having "excruciating pain " in her R ankle and foot  No specific recalled trauma but she is constantly hurting herself  ? If bruised - perhaps discolored  Going on for several weeks  Worst to be on it for more than 5 minutes   Patient Active Problem List  Diagnosis  . PURE HYPERCHOLESTEROLEMIA  . CONSTIPATION, CHRONIC  . Irritable bowel syndrome  . CHOLELITHIASIS  . POSTMENOPAUSAL STATUS  . VAGINITIS, ATROPHIC  . Pain in limb  . Diarrhea  . FREQUENCY, URINARY  . Vomiting  . Tachycardia - pulse  . Shortness of breath dyspnea  . Edema  . Elevated alkaline phosphatase level  . Hemoptysis  . Chronic fatigue syndrome  . Acute bronchitis   Past Medical History  Diagnosis Date  . 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 Dr. Neldon Mc   . Allergic rhinitis   . Migraine   . Chronic gastritis   . Hyperlipidemia   . West Nile virus infection 2003  . Lyme disease     "stari"   Past Surgical History  Procedure Laterality Date  . Tonsillectomy    . Partial hysterectomy      bleeding  . Ulnar nerve repair      sx times 5  . Laprascopy    . Breast biopsy  '04    neg; right  . 5th r toe fx  '09  . Upper gastrointestinal endoscopy  12/04/2004    gastritis   History  Substance Use Topics  . Smoking status: Never Smoker   . Smokeless tobacco: Never Used     Comment: non smoker   . Alcohol Use: No   Family History  Problem Relation Age of Onset  . Bipolar disorder      some family  . Colon cancer Neg Hx   . Breast cancer Paternal Grandmother   . Diabetes Father   . Hypertension Father   . Stroke Father   . Hypertension Mother   . Cancer Mother     Darrick Grinder  . Alcohol abuse Maternal Grandfather   . Ulcers Mother    Allergies  Allergen Reactions  . Sulfonamide Derivatives    Current Outpatient Prescriptions on File Prior to Visit  Medication Sig Dispense Refill  . Cholecalciferol (VITAMIN D3) 5000 UNITS TABS Take 1 tablet by mouth daily.        Marland Kitchen DHEA 25 MG CAPS Take 1 capsule by mouth daily.        . modafinil (PROVIGIL) 100 MG tablet Take 100 mg by mouth as needed.        Marland Kitchen morphine (MSIR) 30 MG tablet Take 30 mg by mouth every 4 (four) hours as needed. UAD PRN       . Multiple Vitamin (MULTIVITAMIN) tablet  Take 1 tablet by mouth daily.        . promethazine (PHENERGAN) 25 MG tablet Take 1 tablet (25 mg total) by mouth every 6 (six) hours as needed for nausea.  30 tablet  0  . azithromycin (ZITHROMAX) 250 MG tablet 2 tab po x 1 day then 1 tab po daily  6 tablet  0   No current facility-administered medications on file prior to visit.      Review of Systems Review of Systems  Constitutional: Negative for fever, appetite change, and unexpected weight change. pos for chronic fatigue ENt pos for post nasal drip/ neg for facial pain  Eyes: Negative for pain and visual disturbance.  Respiratory: Negative for wheeze  and shortness of breath.   Cardiovascular: Negative for cp or palpitations    Gastrointestinal: Negative for nausea, diarrhea and constipation.  Genitourinary: Negative for urgency and frequency.  Skin: Negative for pallor or rash   MSK pos for R foot and ankle pain with ? Swelling  Neurological: Negative for weakness, light-headedness, numbness and headaches.  Hematological: Negative for adenopathy. Does not bruise/bleed easily.   Psychiatric/Behavioral: Negative for dysphoric mood. The patient is not nervous/anxious.         Objective:   Physical Exam  Constitutional: She appears well-developed and well-nourished. No distress.  HENT:  Head: Normocephalic and atraumatic.  Right Ear: External ear normal.  Left Ear: External ear normal.  Mouth/Throat: Oropharynx is clear and moist. No oropharyngeal exudate.  Nares are boggy No facial tenderness  Eyes: Conjunctivae and EOM are normal. Pupils are equal, round, and reactive to light. Right eye exhibits no discharge. Left eye exhibits no discharge. No scleral icterus.  Neck: Normal range of motion. Neck supple. No JVD present. Carotid bruit is not present. No thyromegaly present.  Cardiovascular: Normal rate, regular rhythm and normal heart sounds.   Pulmonary/Chest: Effort normal and breath sounds normal. No respiratory distress. She has no wheezes. She has no rales. She exhibits no tenderness.  Abdominal: Soft. Bowel sounds are normal. She exhibits no abdominal bruit.  Musculoskeletal: She exhibits tenderness.       Right ankle: She exhibits swelling. She exhibits normal range of motion, no ecchymosis, no deformity and normal pulse. Tenderness. Lateral malleolus, posterior TFL and head of 5th metatarsal tenderness found. Achilles tendon normal.  Mild lateral malleolar swelling and tenderness  Lymphadenopathy:    She has no cervical adenopathy.  Neurological: She is alert. She has normal reflexes. No cranial nerve deficit. She exhibits normal muscle tone. Coordination normal.  Skin: Skin is warm and dry. No rash noted. No erythema. No pallor.  Psychiatric: She has a normal mood and affect.          Assessment & Plan:

## 2013-02-01 NOTE — Assessment & Plan Note (Signed)
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

## 2013-02-01 NOTE — Assessment & Plan Note (Signed)
Lateral- ? Injury/ with mild swelling Xray today

## 2013-02-01 NOTE — Assessment & Plan Note (Signed)
?   Poss injury , unsure Mild swelling Lateral MTP tenderness Xray today

## 2013-02-01 NOTE — Patient Instructions (Addendum)
Xray of foot and ankle today  Take another course of zithromax and update me if not improved after that - we may consider infectious disease consult

## 2013-07-12 ENCOUNTER — Encounter: Payer: Self-pay | Admitting: Family Medicine

## 2013-07-12 ENCOUNTER — Ambulatory Visit (INDEPENDENT_AMBULATORY_CARE_PROVIDER_SITE_OTHER): Payer: 59 | Admitting: Family Medicine

## 2013-07-12 VITALS — BP 142/78 | HR 115 | Temp 98.9°F | Ht 66.0 in | Wt 192.5 lb

## 2013-07-12 DIAGNOSIS — L304 Erythema intertrigo: Secondary | ICD-10-CM | POA: Insufficient documentation

## 2013-07-12 DIAGNOSIS — L538 Other specified erythematous conditions: Secondary | ICD-10-CM

## 2013-07-12 DIAGNOSIS — R300 Dysuria: Secondary | ICD-10-CM

## 2013-07-12 DIAGNOSIS — B373 Candidiasis of vulva and vagina: Secondary | ICD-10-CM | POA: Insufficient documentation

## 2013-07-12 DIAGNOSIS — N390 Urinary tract infection, site not specified: Secondary | ICD-10-CM | POA: Insufficient documentation

## 2013-07-12 DIAGNOSIS — R21 Rash and other nonspecific skin eruption: Secondary | ICD-10-CM | POA: Insufficient documentation

## 2013-07-12 LAB — POCT URINALYSIS DIPSTICK
Bilirubin, UA: NEGATIVE
Glucose, UA: NEGATIVE
Ketones, UA: NEGATIVE
Spec Grav, UA: 1.025
Urobilinogen, UA: 1

## 2013-07-12 MED ORDER — TERCONAZOLE 0.4 % VA CREA: 1.0000 | TOPICAL_CREAM | Freq: Every day | VAGINAL | Status: DC

## 2013-07-12 MED ORDER — NITROFURANTOIN MONOHYD MACRO 100 MG PO CAPS: 100.0000 mg | ORAL_CAPSULE | Freq: Two times a day (BID) | ORAL | Status: DC

## 2013-07-12 MED ORDER — KETOCONAZOLE 2 % EX CREA: TOPICAL_CREAM | Freq: Every day | CUTANEOUS | Status: DC

## 2013-07-12 NOTE — Progress Notes (Signed)
Subjective:    Patient ID: [name] Branch, female    DOB: February 20, 1956, 57 y.o.   MRN: [id number]  HPI Here for urinary and vaginal symptoms   Frequent urination with small voids and deep ache in bladder , with bladder spasm and burning to urinate  Azo otc helps - Some mild nausea and intermittent low grade fever - highest 100.6  Also vaginal burning and itching  Got otc treatment - anti yeast cream - with some relief  Also vinegar  and water douche- really burned  Also she tends to get yeast skin infx - under breasts (that has improved)  Also rash that comes and goes  Sores that come and go all over that take a while to heal- legs/ buttocks/ breasts / arms - has some today (treats with colloidal silver/ abx topical/ antifungal topical  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 Right foot pain 02/01/2013  . Right ankle pain 02/01/2013  . Acute bronchitis 01/20/2013  . Chronic fatigue syndrome 01/19/2013  . Hemoptysis 01/17/2013  . Elevated alkaline phosphatase level 09/19/2012  . Shortness of breath dyspnea 09/16/2012  . Edema 09/16/2012  . Vomiting 08/05/2011  . Tachycardia - pulse 08/05/2011  . FREQUENCY, URINARY 08/06/2010  . Diarrhea 04/14/2010  . CHOLELITHIASIS 01/02/2009  . CONSTIPATION, CHRONIC 07/30/2008  . Irritable bowel syndrome 07/30/2008  . PURE HYPERCHOLESTEROLEMIA 06/21/2008  . POSTMENOPAUSAL STATUS 06/21/2008  . VAGINITIS, ATROPHIC 06/21/2008  . Pain in limb 06/21/2008   Past Medical History  Diagnosis Date  . 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 Dr. Neldon Mc   . Allergic rhinitis   . Migraine   . Chronic gastritis   . Hyperlipidemia   . West Nile virus infection 2003  . Lyme disease     "stari"   Past Surgical History  Procedure Laterality Date   . Tonsillectomy    . Partial hysterectomy      bleeding  . Ulnar nerve repair      sx times 5  . Laprascopy    . Breast biopsy  '04    neg; right  . 5th r toe fx  '09  . Upper gastrointestinal endoscopy  12/04/2004    gastritis   History  Substance Use Topics  . Smoking status: Never Smoker   . Smokeless tobacco: Never Used     Comment: non smoker  . Alcohol Use: No   Family History  Problem Relation Age of Onset  . Bipolar disorder      some family  . Colon cancer Neg Hx   . Breast cancer Paternal Grandmother   . Diabetes Father   . Hypertension Father   . Stroke Father   . Hypertension Mother   . Cancer Mother     Darrick Grinder  . Alcohol abuse Maternal Grandfather   . Ulcers Mother    Allergies  Allergen Reactions  . Sulfonamide Derivatives    Current Outpatient Prescriptions on File Prior to Visit  Medication Sig Dispense Refill  . Cholecalciferol (VITAMIN D3) 5000 UNITS TABS Take 1 tablet by mouth daily.        . modafinil (PROVIGIL) 100 MG tablet Take 100 mg by mouth as needed.        Marland Kitchen morphine (MSIR)  30 MG tablet Take 30 mg by mouth every 4 (four) hours as needed. UAD PRN       . Multiple Vitamin (MULTIVITAMIN) tablet Take 1 tablet by mouth daily.        . promethazine (PHENERGAN) 25 MG tablet Take 1 tablet (25 mg total) by mouth every 6 (six) hours as needed for nausea.  30 tablet  0   No current facility-administered medications on file prior to visit.    Review of Systems Review of Systems  Constitutional: Negative for fever, appetite change,  and unexpected weight change. pos for fatigue  Eyes: Negative for pain and visual disturbance.  Respiratory: Negative for cough and shortness of breath.   Cardiovascular: Negative for cp or palpitations    Gastrointestinal: pos for nausea / neg for abd pain / pos for intermittent stool changes  Genitourinary: Negative for urgency and frequency. neg for vaginal discharge  Skin: Negative for pallor or rash  pos  for vaginal itching , pos for "sores" Neurological: Negative for weakness, light-headedness, numbness and headaches.  Hematological: Negative for adenopathy. Does not bruise/bleed easily.  Psychiatric/Behavioral: Negative for dysphoric mood. The patient is not nervous/anxious.         Objective:   Physical Exam  Constitutional: She appears well-developed and well-nourished. No distress.  overwt and fatigued appearing  HENT:  Head: Normocephalic and atraumatic.  Mouth/Throat: Oropharynx is clear and moist.  Eyes: Conjunctivae and EOM are normal. Pupils are equal, round, and reactive to light. Right eye exhibits no discharge. Left eye exhibits no discharge. No scleral icterus.  Neck: Normal range of motion. Neck supple.  Cardiovascular: Normal rate and regular rhythm.   Pulmonary/Chest: Effort normal and breath sounds normal.  Abdominal: Soft. Bowel sounds are normal. She exhibits no distension and no mass. There is tenderness in the suprapubic area. There is no rebound, no guarding and no CVA tenderness.  Genitourinary:  Mild labial injection No d/c or odor No lesions  Musculoskeletal: She exhibits no edema and no tenderness.  Lymphadenopathy:    She has no cervical adenopathy.  Neurological: She is alert. She has normal reflexes. Coordination normal.  Skin: Skin is warm and dry. No pallor.  Some healed scabs- primarily on L hip   Scant redness (resembles healing intertrigo) on L hip primarily  Psychiatric: She has a normal mood and affect.          Assessment & Plan:

## 2013-07-12 NOTE — Patient Instructions (Addendum)
Take the nitrofurantoin for urinary infection Drink lots of water Use the terazol for yeast  Use the ketoconazole for body yeast infections- keep areas clean and dry  Update if not starting to improve in a week or if worsening   Will contact you with urine culture results when they return

## 2013-07-13 LAB — POCT WET PREP (WET MOUNT)

## 2013-07-13 LAB — POCT UA - MICROSCOPIC ONLY

## 2013-07-13 NOTE — Assessment & Plan Note (Signed)
tx with macrobid  Many med intolerances  Inc water intake cx pending Update if not starting to improve in several days  or if worsening

## 2013-07-13 NOTE — Assessment & Plan Note (Signed)
Mild-some imp with otc tx Px terazol 7  Update if not starting to improve in a week or if worsening

## 2013-07-13 NOTE — Assessment & Plan Note (Signed)
Intermittent, primarily under breasts Disc imp of keeping area clean and dry Ketoconazole cream daily prn  Update if not starting to improve in a week or if worsening

## 2013-07-13 NOTE — Assessment & Plan Note (Signed)
Unsure etiology of lesions that are healing on hip (non itchy) Recommend antibact soap and water cleanse with abx oint  Update if not starting to improve in a week or if worsening

## 2013-11-21 ENCOUNTER — Other Ambulatory Visit: Payer: Self-pay | Admitting: Family Medicine

## 2013-11-21 NOTE — Telephone Encounter (Signed)
Electronic refill request.  Please advise. 

## 2013-11-21 NOTE — Telephone Encounter (Signed)
rx sent to pharmacy by e-script  

## 2013-11-21 NOTE — Telephone Encounter (Signed)
Please refill times one  thanks 

## 2013-11-28 ENCOUNTER — Telehealth: Payer: Self-pay | Admitting: Family Medicine

## 2013-11-28 NOTE — Telephone Encounter (Signed)
Agree- I am out of the office

## 2013-11-28 NOTE — Telephone Encounter (Signed)
Patient Information:  Caller Name: Alante  Phone: [phone]  Patient: [name] Branch, [name] Branch  Gender: Female  DOB: 1955/12/03  Age: 58 Years  PCP: Loura Pardon [facility])  Office Follow Up:  Does the office need to follow up with this patient?: No  Instructions For The Office: N/A   Symptoms  Reason For Call & Symptoms: Having urinary bleeding that started at 0500 04/27/14.  Pt has already spoken with someone in the office and advised to go to an U/C and calling to see what I thought.  All proividers booked 100%.   Instructed that is the only way to get a culture done.  Pt states she will go to an U/C then, but not sure which one she will use.  Reviewed Health History In EMR: Yes  Reviewed Medications In EMR: Yes  Reviewed Allergies In EMR: Yes  Reviewed Surgeries / Procedures: Yes  Date of Onset of Symptoms: 11/28/2013  Guideline(s) Used:  No Protocol Available - Information Only  Disposition Per Guideline:   Home Care  Reason For Disposition Reached:   Information only question and nurse able to answer  Advice Given:  N/A  Call back instructions given.   Patient Will Follow Care Advice:  YES

## 2013-12-01 ENCOUNTER — Telehealth: Payer: Self-pay

## 2013-12-01 NOTE — Telephone Encounter (Signed)
Please have her keep that urology appt  If she can - please drop off specimen today- I want to repeat a cx for hematuria in the meantime  If any other new symptoms- please let me know

## 2013-12-01 NOTE — Telephone Encounter (Signed)
Pt was seen at [facility] on 11/28/13 for blood in urine; pt got call this AM from [facility] that UC was negative; pt is still bleeding when urinates so pt was advised to go to ED for eval. Pt does not want to go to ED due to cost; pt said still voiding blood (not bright bed but not dark red either); pt voiding less amount and less often than normal. Pt has low abd pain on and off; pain level now is 1-2. Now temp is 99.3 tympanic. No appetite. No back pain. Pt has appt with urologist on 12/11/13 and cannot be seen sooner. No hx of kidney stone, no hemorrhoids and no vaginal bleeding.spoke with Erlene Quan at [facility] [phone] and he is faxing [facility] record with labs to [phone].Please advise.

## 2013-12-01 NOTE — Telephone Encounter (Signed)
Received notes from UC and placed in your inbox

## 2013-12-01 NOTE — Telephone Encounter (Signed)
Spoke with Dr Marliss Coots CMA and pt does need to go to ED for eval and possible imaging. Pt is not sure which ED she will go to, pt will discuss with husband but pt said she would go today for eval to an ED.

## 2013-12-05 NOTE — Telephone Encounter (Signed)
Called pt and no answer and it just says "the machine is off"

## 2013-12-07 NOTE — Telephone Encounter (Signed)
I have records from the UC pt went to that Dr. Glori Bickers want me to send to the urologist pt is seeing on 12/11/13, I have not been able to reach pt to see what urologist pt is going to see

## 2013-12-07 NOTE — Telephone Encounter (Signed)
Called pt again and no answer and it just said "the machine is off"

## 2014-06-26 ENCOUNTER — Ambulatory Visit (INDEPENDENT_AMBULATORY_CARE_PROVIDER_SITE_OTHER): Payer: 59 | Admitting: Family Medicine

## 2014-06-26 ENCOUNTER — Encounter: Payer: Self-pay | Admitting: Family Medicine

## 2014-06-26 VITALS — BP 128/66 | HR 100 | Temp 98.5°F | Ht 65.0 in | Wt 185.0 lb

## 2014-06-26 DIAGNOSIS — L304 Erythema intertrigo: Secondary | ICD-10-CM

## 2014-06-26 DIAGNOSIS — G9332 Myalgic encephalomyelitis/chronic fatigue syndrome: Secondary | ICD-10-CM

## 2014-06-26 DIAGNOSIS — R300 Dysuria: Secondary | ICD-10-CM

## 2014-06-26 DIAGNOSIS — M899 Disorder of bone, unspecified: Secondary | ICD-10-CM

## 2014-06-26 DIAGNOSIS — Z0000 Encounter for general adult medical examination without abnormal findings: Secondary | ICD-10-CM | POA: Insufficient documentation

## 2014-06-26 DIAGNOSIS — Z23 Encounter for immunization: Secondary | ICD-10-CM

## 2014-06-26 DIAGNOSIS — R5382 Chronic fatigue, unspecified: Secondary | ICD-10-CM

## 2014-06-26 DIAGNOSIS — M949 Disorder of cartilage, unspecified: Secondary | ICD-10-CM

## 2014-06-26 DIAGNOSIS — R739 Hyperglycemia, unspecified: Secondary | ICD-10-CM

## 2014-06-26 DIAGNOSIS — R7309 Other abnormal glucose: Secondary | ICD-10-CM

## 2014-06-26 DIAGNOSIS — M8580 Other specified disorders of bone density and structure, unspecified site: Secondary | ICD-10-CM

## 2014-06-26 DIAGNOSIS — L538 Other specified erythematous conditions: Secondary | ICD-10-CM

## 2014-06-26 DIAGNOSIS — M810 Age-related osteoporosis without current pathological fracture: Secondary | ICD-10-CM | POA: Insufficient documentation

## 2014-06-26 DIAGNOSIS — R7303 Prediabetes: Secondary | ICD-10-CM | POA: Insufficient documentation

## 2014-06-26 DIAGNOSIS — E7800 Pure hypercholesterolemia, unspecified: Secondary | ICD-10-CM

## 2014-06-26 DIAGNOSIS — Z1211 Encounter for screening for malignant neoplasm of colon: Secondary | ICD-10-CM

## 2014-06-26 LAB — POCT URINALYSIS DIPSTICK
Bilirubin, UA: NEGATIVE
Glucose, UA: NEGATIVE
Nitrite, UA: NEGATIVE
SPEC GRAV UA: 1.02
Urobilinogen, UA: 1
pH, UA: 6.5

## 2014-06-26 MED ORDER — KETOCONAZOLE 2 % EX CREA: TOPICAL_CREAM | Freq: Every day | CUTANEOUS | Status: DC

## 2014-06-26 NOTE — Progress Notes (Signed)
Subjective:    Patient ID: [name] Branch, female    DOB: 1956-02-20, 58 y.o.   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 She plans to f/u with another dentist   She is interested in seeing ID in [facility] - to see if she has any zootonic infections - causing her lung issue/ dental problems or other symptoms   Wt is down 7 lb with bmi of 30 She is not trying to do anything to loose particularly-poss less hungry  Partial hysterect in the past- that was for menstrual bleeding and miscarriage -not for cancer  Gyn hx  Mammogram 9/13 - she is interested in a mammogram this year  Self exam- no changes  Has battled a rash under breasts lately - had odor - used abx cream and anti fungal - improved today   Colon cancer screening  She does not feel well enough for a colonoscopy  Will do IFOB for now   Flu vaccine declines due to her chronic fatigue -was told not to take them by her last CFIDs specialist in [place] (per pt)  She may consider it later   Td 1/05 - wants to get that done today   Hx of hyperlipidemia Recent labs tot chol 179, with HDL is 32, and LDL is 117 , and trig are 148 Does not eat fish or take  fish oil - but may be open to flax seed oil  Limited with exercise due to pain  Increased fiber diet seems to help   Did A1C for work- was 6.2  Down from 6.3  She does try to avoid sugar Exercise is very limited   Patient Active Problem List   Diagnosis Date Noted  . Routine general medical examination at a health care facility 06/26/2014  . Osteopenia 06/26/2014  . Hyperglycemia 06/26/2014  . Colon cancer screening 06/26/2014  . Dysuria 06/26/2014  . Intertrigo 07/12/2013  . Chronic fatigue syndrome 01/19/2013  . Elevated alkaline phosphatase level 09/19/2012  . Edema 09/16/2012  . FREQUENCY, URINARY 08/06/2010  . Diarrhea 04/14/2010  . CHOLELITHIASIS 01/02/2009  . CONSTIPATION, CHRONIC 07/30/2008  . Irritable bowel syndrome 07/30/2008  . PURE HYPERCHOLESTEROLEMIA 06/21/2008  . POSTMENOPAUSAL STATUS 06/21/2008  . VAGINITIS, ATROPHIC 06/21/2008  . Pain in limb 06/21/2008   Past Medical History  Diagnosis Date  . 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 Dr. Cristy Friedlander   . Allergic rhinitis   . Migraine   . Chronic gastritis   . Hyperlipidemia   .  West Nile Virus infection 2003  . Lyme disease     "stari"   Past Surgical History  Procedure Laterality Date  . Tonsillectomy    . Partial hysterectomy      bleeding  . Ulnar nerve repair      sx times 5  . Laprascopy    . Breast biopsy  '04    neg; right  . 5th r toe fx  '09  . Upper gastrointestinal endoscopy  12/04/2004    gastritis   History  Substance Use Topics  . Smoking status: Never Smoker   . Smokeless tobacco: Never Used     Comment: non smoker  . Alcohol Use: No   Family History  Problem Relation Age of Onset  . Bipolar disorder      some family  . Colon cancer Neg Hx   . Breast cancer Paternal Grandmother   . Diabetes Father   . Hypertension Father   . Stroke Father   . Hypertension Mother   . Cancer Mother     [name]  .  Alcohol abuse Maternal Grandfather   . Ulcers Mother    Allergies  Allergen Reactions  . Sulfonamide Derivatives    Current Outpatient Prescriptions on File Prior to Visit  Medication Sig Dispense Refill  . Calcium-Magnesium-Vitamin D (CALCIUM MAGNESIUM PO) Take 1 tablet by mouth daily.      . Cholecalciferol (VITAMIN D3) 5000 UNITS TABS Take 1 tablet by mouth daily.        Marland Kitchen FIBER SELECT GUMMIES PO Take 1-3 tablets by mouth daily.      . modafinil (PROVIGIL) 100 MG tablet Take by mouth. Take 25-50 mg prn      . morphine (MSIR) 30 MG tablet Take 30 mg by mouth every 3 (three) hours as needed. UAD PRN      . Multiple Vitamin (MULTIVITAMIN) tablet Take 1 tablet by mouth daily.        . promethazine (PHENERGAN) 25 MG tablet Take 1 tablet (25 mg total) by mouth every 6 (six) hours as needed for nausea.  30 tablet  0  . terconazole (TERAZOL 7) 0.4 % vaginal cream Place 1 applicator vaginally at bedtime.  45 g  0   No current facility-administered medications on file prior to visit.    Review of Systems Review of Systems  Constitutional: Negative for fever, appetite change,  Pos for fatigue and general malaise and chronic pain   Eyes: Negative for pain and visual disturbance.  Respiratory: Negative for cough and shortness of breath.   Cardiovascular: Negative for cp or palpitations    Gastrointestinal: Negative for nausea, and constipation. pos for chronic bloating ,, pos for intermittent  diarrhea  Genitourinary: Negative for urgency and pos for chronic frequency and dysuria, neg for visible neg for thirst  hematuria/ pos for chronic micro hematuria  Skin: Negative for pallor and pos for rash under breasts that is improving  MSK pos for widespread pain and impaired mobility Neurological: Negative for weakness, light-headedness, numbness and pos for  headaches.  Hematological: Negative for adenopathy. Does not bruise/bleed easily.  Psychiatric/Behavioral: Negative for dysphoric mood. The  patient is not nervous/anxious.         Objective:   Physical Exam  Constitutional: She appears well-developed and well-nourished. No distress.  obese and fatigued appearing   HENT:  Head: Normocephalic and atraumatic.  Right Ear: External ear normal.  Left Ear: External ear normal.  Mouth/Throat: Oropharynx is clear and moist.  No  sinus tenderness  Boggy nares   Eyes: Conjunctivae and EOM are normal. Pupils are equal, round, and reactive to light. No scleral icterus.  Neck: Normal range of motion. Neck supple. No JVD present. Carotid bruit is not present. No thyromegaly present.  Cardiovascular: Normal rate, regular rhythm, normal heart sounds and intact distal pulses.  Exam reveals no gallop.   Pulmonary/Chest: Effort normal and breath sounds normal. No respiratory distress. She has no wheezes. She exhibits no tenderness.  Abdominal: Soft. Bowel sounds are normal. She exhibits no distension, no abdominal bruit and no mass. There is tenderness.  Mild suprapubic tenderness No cva tenderness   Genitourinary: No breast swelling, tenderness, discharge or bleeding.  Breast exam: No mass, nodules, thickening, tenderness, bulging, retraction, inflamation, nipple discharge or skin changes noted.  No axillary or clavicular LA.      Musculoskeletal: Normal range of motion. She exhibits no edema and no tenderness.  Lymphadenopathy:    She has no cervical adenopathy.  Neurological: She is alert. She has normal reflexes. No cranial nerve deficit. She exhibits normal muscle tone. Coordination normal.  Skin: Skin is warm and dry. No rash noted. No erythema. No pallor.  Scant erythema under each breast  No satellite lesions or skin breakdown  Some mild hyperpigmentation of ankles (brown)  Psychiatric: She has a normal mood and affect. She is slowed.          Assessment & Plan:   Problem List Items Addressed This Visit     Musculoskeletal and Integument   Osteopenia     Due for dexa  -will refer her for that  Disc need for calcium/ vitamin D/ wt bearing exercise and bone density test every 2 y to monitor Disc safety/ fracture risk in detail   No recent falls or fractures      Relevant Orders      DG Bone Density     Other   PURE HYPERCHOLESTEROLEMIA - Primary     Disc goals for lipids and reasons to control them Rev labs with pt Rev low sat fat diet in detail  HDL of 32 and LDL of 117 She is willing to try flax seed oil to inc her HDL  Exercise  Is limited      Chronic fatigue syndrome     Ongoing with chronic malaise and chronic pain  This limits activity and exercise unfortunately  Per pt - her specialist told her not to get a flu shot with this condition-she may change her mind  Does not feel well enough to have a colonoscopy at this time     Routine general medical examination at a health care facility     Reviewed health habits including diet and exercise and skin cancer prevention Reviewed appropriate screening tests for age  Also reviewed health mt list, fam hx and immunization status , as well as social and family history   Tdap today Declines flu shot at this time  Reviewed labs from [facility] Will complete wellness labs today    Relevant Orders      CBC with Differential      Comprehensive metabolic panel      TSH   Hyperglycemia     A1C is 6.2 Disc prediabetes and need for wt loss  Disc low glycemic diet -she is doing well with this overall  Needs more exercise- challenging with her chronic pain     Colon cancer screening     Pt desires a colonoscopy for screening in  the future- but does not feel well enough for it now  Will do IFOB card instead     Dysuria      With chronic hematuria (had urol w/u in the past and hx of renal stone) Dysuria is chronic for her w/o infection  Rev UA Results for orders placed in visit on 06/26/14  POCT URINALYSIS DIPSTICK      Result Value Ref Range   Color, UA Dark Yellow     Clarity, UA Hazy      Glucose, UA neg.     Bilirubin, UA neg.     Ketones, UA trace     Spec Grav, UA 1.020     Blood, UA Moderate     pH, UA 6.5     Protein, UA Trace     Urobilinogen, UA 1.0     Nitrite, UA neg.     Leukocytes, UA Trace         Relevant Orders      POCT urinalysis dipstick (Completed)    Other Visit Diagnoses   Need for Tdap vaccination        Relevant Orders       Tdap vaccine greater than or equal to 7yo IM (Completed)

## 2014-06-26 NOTE — Patient Instructions (Addendum)
Please do stool card for colon cancer screening  Tdap vaccine today  Labs today  Try flax seed oil to increase your HDL cholesterol  Stop at check out for referral for bone density test  Follow up with me in 6 months with labs prior for hyperglycemia, watch sugar and carbs in diet  Stay as active as you can   Don't forget to schedule your mammogram at [facility]

## 2014-06-27 LAB — CBC WITH DIFFERENTIAL/PLATELET
Basophils Absolute: 0.1 10*3/uL (ref 0.0–0.2)
Basos: 1 %
EOS: 4 %
Eosinophils Absolute: 0.3 10*3/uL (ref 0.0–0.4)
HEMATOCRIT: 42.9 % (ref 34.0–46.6)
Hemoglobin: 14.3 g/dL (ref 11.1–15.9)
IMMATURE GRANS (ABS): 0 10*3/uL (ref 0.0–0.1)
IMMATURE GRANULOCYTES: 0 %
LYMPHS ABS: 2.9 10*3/uL (ref 0.7–3.1)
Lymphs: 38 %
MCH: 27.9 pg (ref 26.6–33.0)
MCHC: 33.3 g/dL (ref 31.5–35.7)
MCV: 84 fL (ref 79–97)
MONOCYTES: 10 %
Monocytes Absolute: 0.8 10*3/uL (ref 0.1–0.9)
NEUTROS PCT: 47 %
Neutrophils Absolute: 3.6 10*3/uL (ref 1.4–7.0)
RBC: 5.13 x10E6/uL (ref 3.77–5.28)
RDW: 13.8 % (ref 12.3–15.4)
WBC: 7.6 10*3/uL (ref 3.4–10.8)

## 2014-06-27 LAB — COMPREHENSIVE METABOLIC PANEL
A/G RATIO: 1.2 (ref 1.1–2.5)
ALT: 30 IU/L (ref 0–32)
AST: 49 IU/L — AB (ref 0–40)
Albumin: 4.4 g/dL (ref 3.5–5.5)
Alkaline Phosphatase: 139 IU/L — ABNORMAL HIGH (ref 39–117)
BILIRUBIN TOTAL: 0.7 mg/dL (ref 0.0–1.2)
BUN / CREAT RATIO: 15 (ref 9–23)
BUN: 10 mg/dL (ref 6–24)
CO2: 26 mmol/L (ref 18–29)
CREATININE: 0.66 mg/dL (ref 0.57–1.00)
Calcium: 10.1 mg/dL (ref 8.7–10.2)
Chloride: 95 mmol/L — ABNORMAL LOW (ref 97–108)
GFR, EST AFRICAN AMERICAN: 113 mL/min/{1.73_m2} (ref >59)
GFR, EST NON AFRICAN AMERICAN: 98 mL/min/{1.73_m2} (ref >59)
Globulin, Total: 3.6 g/dL (ref 1.5–4.5)
Glucose: 87 mg/dL (ref 65–99)
POTASSIUM: 4.6 mmol/L (ref 3.5–5.2)
SODIUM: 137 mmol/L (ref 134–144)
Total Protein: 8 g/dL (ref 6.0–8.5)

## 2014-06-27 LAB — TSH: TSH: 2.94 u[IU]/mL (ref 0.450–4.500)

## 2014-06-27 NOTE — Assessment & Plan Note (Signed)
Ongoing with chronic malaise and chronic pain  This limits activity and exercise unfortunately  Per pt - her specialist told her not to get a flu shot with this condition-she may change her mind  Does not feel well enough to have a colonoscopy at this time

## 2014-06-27 NOTE — Assessment & Plan Note (Addendum)
Due for dexa -will refer her for that  Disc need for calcium/ vitamin D/ wt bearing exercise and bone density test every 2 y to monitor Disc safety/ fracture risk in detail   No recent falls or fractures

## 2014-06-27 NOTE — Assessment & Plan Note (Signed)
A1C is 6.2 Disc prediabetes and need for wt loss  Disc low glycemic diet -she is doing well with this overall  Needs more exercise- challenging with her chronic pain

## 2014-06-27 NOTE — Assessment & Plan Note (Signed)
With chronic hematuria (had urol w/u in the past and hx of renal stone) Dysuria is chronic for her w/o infection  Rev UA Results for orders placed in visit on 06/26/14  POCT URINALYSIS DIPSTICK      Result Value Ref Range   Color, UA Dark Yellow     Clarity, UA Hazy     Glucose, UA neg.     Bilirubin, UA neg.     Ketones, UA trace     Spec Grav, UA 1.020     Blood, UA Moderate     pH, UA 6.5     Protein, UA Trace     Urobilinogen, UA 1.0     Nitrite, UA neg.     Leukocytes, UA Trace

## 2014-06-27 NOTE — Assessment & Plan Note (Signed)
Under breasts - improved today (per pt) after tx with antifungal (refilled) and also abx oint

## 2014-06-27 NOTE — Assessment & Plan Note (Signed)
Disc goals for lipids and reasons to control them Rev labs with pt Rev low sat fat diet in detail  HDL of 32 and LDL of 117 She is willing to try flax seed oil to inc her HDL  Exercise  Is limited

## 2014-06-27 NOTE — Assessment & Plan Note (Signed)
Pt desires a colonoscopy for screening in the future- but does not feel well enough for it now  Will do IFOB card instead

## 2014-06-27 NOTE — Assessment & Plan Note (Signed)
Reviewed health habits including diet and exercise and skin cancer prevention Reviewed appropriate screening tests for age  Also reviewed health mt list, fam hx and immunization status , as well as social and family history   Tdap today Declines flu shot at this time  Reviewed labs from [place] Will complete wellness labs today

## 2014-06-28 ENCOUNTER — Encounter: Payer: Self-pay | Admitting: *Deleted

## 2014-08-06 ENCOUNTER — Ambulatory Visit: Payer: Self-pay | Admitting: Family Medicine

## 2014-08-06 ENCOUNTER — Encounter: Payer: Self-pay | Admitting: Family Medicine

## 2014-08-06 LAB — HM DEXA SCAN

## 2014-08-07 ENCOUNTER — Encounter: Payer: Self-pay | Admitting: Family Medicine

## 2014-08-09 ENCOUNTER — Encounter: Payer: Self-pay | Admitting: *Deleted

## 2014-08-15 ENCOUNTER — Encounter: Payer: Self-pay | Admitting: Family Medicine

## 2014-08-15 ENCOUNTER — Encounter: Payer: Self-pay | Admitting: *Deleted

## 2014-11-19 ENCOUNTER — Telehealth: Payer: Self-pay | Admitting: Family Medicine

## 2014-11-19 NOTE — Telephone Encounter (Signed)
I agree with that advisement and will be on the look out for ER notes

## 2014-11-19 NOTE — Telephone Encounter (Signed)
Patient Name: LATRESHIA BEAUCHAINE DOB: 1956-03-26 Initial Comment Caller states her feet and legs are swollen and she is having trouble breathing. Nurse Assessment Nurse: Wynetta Emery, RN, Baker Janus Date/Time Eilene Ghazi Time): 11/19/2014 9:19:34 AM Confirm and document reason for call. If symptomatic, describe symptoms. ---Hinton Dyer has severe edema in bilateral legs worse than normal right leg is hard and having difficulty breathing with this. onset Saturday night Has the patient traveled out of the country within the last 30 days? ---No Does the patient require triage? ---Yes Related visit to physician within the last 2 weeks? ---Yes UC last two weeks referral given for Pulmologist Does the PT have any chronic conditions? (i.e. diabetes, asthma, etc.) ---Yes List chronic conditions. ---?CHF Guidelines Guideline Title Affirmed Question Affirmed Notes Leg Swelling and Edema Difficulty breathing at rest Final Disposition User Go to ED Now Wynetta Emery, RN, Baker Janus

## 2014-11-19 NOTE — Telephone Encounter (Signed)
Please see note below.  Pt told to go to ER

## 2015-02-17 NOTE — Consult Note (Signed)
Brief Consult Note: Diagnosis: Abdominal pain RUQ as well as epigastrium with associated nausea and vomiting. Known history of myalgic encephalopathy and chronic lymes disease  requiring chronic pain management.  Imagining studies revealed evidence of cholelithiasis.   Consult note dictated.   Discussed with Attending MD.   Comments: Patient's presentation discussed with Dr. Verdie Shire. Patient will be seen and evaluated by Dr. Verdie Shire.  Do feel her underlying symptoms are possible in correlation with cholelithiasis.  Further recommendations will follow in Dr. Myrna Blazer note once she has been seen and evaluated by himself.  Electronic Signatures: Payton Emerald (NP)  (Signed 17-Jan-13 15:51)  Authored: Brief Consult Note   Last Updated: 17-Jan-13 15:51 by Payton Emerald (NP)

## 2015-02-17 NOTE — Consult Note (Signed)
PATIENT NAME:  [name] Branch, [name] Branch MR#:  [id number] DATE OF BIRTH:  [birth year]  DATE OF CONSULTATION:  11/11/2011  REFERRING PHYSICIAN:   CONSULTING PHYSICIAN:  Consuela Mimes, MD  HISTORY OF PRESENT ILLNESS: The patient is a 59 year old white female who had an upper respiratory tract type infection with nasal congestion, fever, myalgias, and cough that lasted 5 to 6 days and then had an episode of right upper quadrant and epigastric pain that lasted for about seven hours and she sought medical attention in the Emergency Department yesterday. She was admitted with her flulike symptoms and had (and still has) normal white blood cell count, hemoglobin, hematocrit, and platelet count as well as electrolytes. Her lipase on admission was 74. Her hepatic profile on admission was normal with the exception of an SGOT of 48 but today her bilirubin has increased from 0.5 to 1.1 and her SGOT remains 48. Gallbladder ultrasound today shows hepatic steatosis with multiple mobile gallstones, a negative sonographic Murphy's sign, normal gallbladder wall thickness, no pericholecystic fluid, and a common bile duct of 5 mm. The patient has had multiple episodes of right upper quadrant pain over the last three years or so but these typically only last about 30 minutes and are quite crampy in nature and resolve with a homeopathic antispasmodic therapy. Regarding her upper respiratory tract infectious type symptoms, Influenza antigens were negative on admission and the patient says that she is about halfway through her cold or flulike illness. Her right upper quadrant pain has resolved and has not recurred. When she had her right upper quadrant pain, she also experienced back pain.   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   HOME MEDICATIONS: Sustained-release morphine 30 mg q.3 to 6 hours p.r.n.   ALLERGIES: None.   SOCIAL HISTORY: The patient runs a [employer]. She does not  smoke or drink. She has recently lost her mother and her younger sister and is concerned about her 48 year old niece.   FAMILY HISTORY: Noncontributory.   REVIEW OF SYSTEMS: Positive for fever, fatigue, weakness, and chronic pain as well as dry cough and nasal congestion. Pertinent negatives are she denies change in the color of her skin, eyes, urine, or stool.   PHYSICAL EXAMINATION:   GENERAL: Pleasant middle-aged lady lying in the hospital bed.   VITAL SIGNS: Height 5 feet 6 inches, weight 186 pounds, BMI 30.0. Temperature maximum (about 24 hours ago) was 100.9, currently afebrile, pulse 91, respirations 20, blood pressure 117/73, oxygen saturation 92 to 93% on room air.   HEENT: Pupils equally round and reactive to light. Extraocular movements intact. Sclerae nonicteric. Oropharynx clear.   NECK: Supple with no lymphadenopathy. Trachea is midline and there is no jugular venous distention.   HEART: Regular rate and rhythm with no murmurs or rubs.   LUNGS: Clear to auscultation with normal respiratory effort bilaterally.   ABDOMEN: Obese, soft, nontender, and nondistended.   EXTREMITIES: No edema with normal capillary refill bilaterally.   NEUROLOGIC: Cranial nerves II through XII, motor and sensation grossly intact.   PSYCHIATRIC: Alert and oriented x4. Appropriate affect. The patient appears to have a fairly normal grief reaction despite multiple losses in her family.   ASSESSMENT: Possible symptomatic gallstones and possible passed common bile duct stone versus sphincter of Oddi spasm from narcotics causing the mild increase in bilirubin. As the patient has an active viral upper respiratory tract infection, elective surgery would best be postponed. Therefore, I gave the patient my business card  to make an appointment to see me for elective cholecystectomy. She is interested in doing that.   Because it is possible that patient has a common bile duct stone or passed a common bile  duct stone, I have ordered repeat liver function tests tomorrow and we will follow her during this hospitalization.   ____________________________ Consuela Mimes, MD wfm:drc D: 11/11/2011 21:21:00 ET T: 11/12/2011 10:12:50 ET JOB#: [id number]  cc: Consuela Mimes, MD, <Dictator> Consuela Mimes MD ELECTRONICALLY SIGNED 11/20/2011 8:55

## 2015-02-17 NOTE — H&P (Signed)
PATIENT NAME:  [name] Branch, [name] Branch MR#:  [id number] DATE OF BIRTH:  12-17-1955  DATE OF ADMISSION:  11/10/2011  PRIMARY CARE PHYSICIAN:  Dr. Loura Pardon, [place]  PAIN DOCTOR: [place], Dr. Huel Coventry REFERRING PHYSICIAN:  Dr. Liana Gerold  CHIEF COMPLAINT: Nausea, vomiting, and flulike illness.   HISTORY OF PRESENT ILLNESS: The patient is a 59 year old female with a known history of myalgic encephalopathy who is being admitted for suspected viral syndrome. The patient's husband has been sick with a flulike illness for at least the last six days. She started having a similar illness in the last five days. She started having nausea, vomiting, and pain all over her body. She has also been coughing and has had a low-grade fever for the last five days. Her pain in the right side of the chest was uncontrolled. It was radiating to the back and she waited and self-medicated all day yesterday, but it was uncontrolled, continuous pain for at least seven hours and she decided to come to the Emergency Department. While in the ED, she is still complaining of 8/10 pain and she is being admitted for further evaluation and management and for pain control. She has not been having any more vomiting. Her nausea is still present and she has been coughing, mainly dry.   PAST MEDICAL HISTORY:  1. Myalgic encephalopathy. 2. Chronic Lyme disease.   MEDICATIONS AT HOME:  Morphine sustained release 30 mg every 3 to 6 hours as needed.   ALLERGIES: No known drug allergies.   SOCIAL HISTORY: No smoking. No alcohol. She runs a wildlife hospital.   FAMILY HISTORY: Mother had cancer. Father with hypertension.   REVIEW OF SYSTEMS: CONSTITUTIONAL: Positive for fever, fatigue, weakness, and chronic pain. EYES: No blurred or double vision. ENT: No tinnitus or ear pain. RESPIRATORY: Positive for dry cough. No wheezing. No hemoptysis. Positive for chest pain.   CARDIOVASCULAR: Positive for chest pain and dyspnea. GI:   Positive for nausea and vomiting along with right upper quadrant abdominal pain. GU: No dysuria or hematuria. ENDOCRINE: No polyuria or nocturia. HEMATOLOGIC: No anemia or easy bruising. SKIN: No rash or lesion. MUSCULOSKELETAL: Arthralgia and history of myalgic encephalopathy and chronic Lyme disease causing chronic pain. NEUROLOGIC: No tingling, numbness, or weakness.  PSYCHIATRIC: No history of anxiety or depression.   PHYSICAL EXAMINATION:  VITAL SIGNS: Temperature 98.6, heart rate 90 per minute, respirations 18 per minute, blood pressure 130/84 mmHg.  She is saturating 95% on room air.   GENERAL: The patient is a 59 year old female lying in bed comfortably without any acute distress.   EYES: Pupils equal, round reactive to light and accommodation. Scleral icterus. Extraocular muscles intact.   HENT: Head atraumatic, normocephalic. Oropharynx and nasopharynx clear.   NECK: Supple. No jugular venous distention. No thyroid enlargement or tenderness.   LUNGS: Clear to auscultation bilaterally. No wheezes, rales, rhonchi, or crepitation.   HEART: S1, S2 normal. No murmur, rubs, or gallop.   ABDOMEN: Soft, nontender, nondistended. Bowel sounds present. No organomegaly or masses.   EXTREMITIES: No pedal edema, cyanosis, or clubbing.   NEUROLOGIC: Nonfocal examination. Cranial nerves III through XII intact. Muscle strength five out of five in all extremities. Sensation intact.   PSYCH: The patient is oriented to time, place, and person times three.   SKIN: No obvious rash, lesion, or ulcer.   LABORATORY PANEL: Normal CBC. Normal BMP. Normal liver function tests. Cardiac enzymes showed CK 216, MB fraction 14.6. D-dimer 0.49. Chest x-ray showed nonspecific  interstitial pulmonary opacities, possible interstitial pulmonary edema. Chest CT in the Emergency Department showed no pulmonary embolism.  Large fatty liver. Cholelithiasis. Possible pulmonary edema. Septal thickening with patchy  ground-glass opacities and dependent linear atelectasis.   EKG showed normal sinus rhythm with no major ST-T changes.   IMPRESSION AND PLAN:  1. Suspected viral syndrome, possible influenza: We will check flu test, provide pain control, start on IV hydration. 2. Chest pain under the ribcage: Likely due to viral syndrome, but we will rule out with serial cardiac enzymes. Provide pain control.  3. Nausea and vomiting: We will get abdominal ultrasound to rule out any gallstone. Provide symptomatic treatment.  4. Myalgic encephalopathy: She is using morphine sustained-release on a regular basis. We will confirm the dosage with the pharmacy.  For now we will continue that and add Dilaudid for breakthrough pain. 5. Abnormal CT scan and/or chest x-ray:  Her chest CT does not show any dissection.  There is a concern for possible consolidation in the lower lobe, although this seems more atelectasis. She does not have any white count, has not had any documented fever. We will hold off starting antibiotic at this time. This could be viral in nature. We will consider pulmonary consult if no significant improvement.   TOTAL TIME TAKING CARE OF THIS PATIENT: 55 minutes.    ____________________________ Lucina Mellow. Manuella Ghazi, MD vss:bjt D: 11/10/2011 08:22:59 ET T: 11/10/2011 09:22:18 ET JOB#: [id number]  cc: Alexus Galka S. Manuella Ghazi, MD, <Dictator> [place]. Glori Bickers, MD Lucina Mellow [facility] MD ELECTRONICALLY SIGNED 11/11/2011 14:10

## 2015-02-17 NOTE — Consult Note (Signed)
PATIENT NAME:  [name] Branch, [name] MR#:  [id number] DATE OF BIRTH:  Nov 14, 1955  DATE OF CONSULTATION:  11/12/2011  REFERRING PHYSICIAN:  Theodoro Grist, MD  CONSULTING PHYSICIAN:  Verdie Shire, MD/Dawn Ruthe Mannan, NP   PAIN DOCTOR: Dr. Huel Coventry at [place]: Dr. Loura Pardon, MD at [place]: Upper abdominal pain.   HISTORY OF PRESENT ILLNESS: [name] Branch is a 59 year old Caucasian female who presented to [facility] Emergency Room on 11/10/2011. She has a significant past medical history of myalgic encephalopathy as well as chronic Lyme disease. She was admitted for suspected viral syndrome. Her husband had been sick with flulike symptoms for at least six days prior to her presentation. She had been sick with similar symptoms for five days inclusive of a nonproductive cough and fever of 101 to 102.9. She did have Influenza test done in the Emergency Room which was negative. She has been experiencing right upper quadrant abdominal pain which is radiating across upper abdomen through to her back around to her sides. The patient continues to require the administration of Dilaudid as well as getting morphine sulfate by mouth. Dilaudid dose needed to be increased as it was not controlling it very well. During interviewing process at the time her pain was an 8 to a 9 on a scale of 1 to 10. She remains nauseated, had vomited right before I presented to the room green bile-like emesis with very small amount of specks of bright red blood. This was the second episode of vomiting today. She has not had a bowel movement in eight days, the last one being 11/06/2011. The patient states a month ago she was treated for suspected Giardia. She had undergone stool studies which were all unremarkable. Husband is unable to recall antibiotic which he was given. 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 No evidence of rectal bleeding. No melena.   The patient had previously been  diagnosed with cholelithiasis. She had a CT scan of the chest done on admission for evaluation of possible pulmonary emboli. CT scan revealed evidence of cholelithiasis. There was no evidence of thoracic aortic aneurysm or dissection, area of consolidation in the lower lobe regions with some patchy areas of septal thickening and ground-glass attenuation possibly secondary to acquisito out of the study during expiration. Diffuse prominent hepatic steatosis. This was followed up with abdominal ultrasound which was done on January 16th which did reveal the liver echotexture appears to be increased suggestive of hepatic steatosis. There is echogenic mobile stones within the gallbladder along with some biliary sludge. No sonographic Murphy's sign. Gallbladder wall thickening is 2.5 mm. Common bile duct is 5.1 mm. The portal venous flow was normal.   CURRENT MEDICATIONS:  1. Morphine sustained release 30 mg every 3 to 6 hours as needed. 2. Provigil as needed. 3. Benadryl as needed. 4. Decongestant over-the-counter as directed as needed.   ALLERGIES: None.   PAST MEDICAL HISTORY:  1. Myalgic encephalopathy. 2. Chronic Lyme disease.   PAST SURGICAL HISTORY:  1. Hysterectomy.  2. Ulna nerve repair. 3. Lumbar spinal surgery.  4. Tonsillectomy.   FAMILY HISTORY: Mother history of Waldenstrom form of cancer. Father hypertension and diabetes. No family history of colon cancer. Sister with history of breast cancer.   SOCIAL HISTORY: No tobacco. No alcohol. Married. Runs a wildlife hospital.   REVIEW OF SYSTEMS: CONSTITUTIONAL: Significant for fever, fatigue, weakness, chronic pain. EYES: No blurred vision, double vision. ENT: No  tinnitus or ear pain. RESPIRATORY: Significant for dry cough. No hemoptysis. Significant for chest pain. CARDIOVASCULAR: Significant for chest pain and dyspnea. GI: See history of present illness. GU: No dysuria or hematuria. ENDOCRINE: No polyuria or nocturia. HEMATOLOGIC: No  anemia or significant for easy bruising. SKIN: No rashes. No lesions. MUSCULOSKELETAL: Significant for arthralgias. History of myalgic encephalopathy and chronic Lyme disease causing chronic pain. NEUROLOGIC: No numbness. No tingling. PSYCH: No depression. No anxiety.   PHYSICAL EXAMINATION:   VITAL SIGNS: Temperature 98, pulse 74, respirations 20, blood pressure 126/69, pulse oximetry 95%.   GENERAL: Well developed, well nourished 59 year old Caucasian female who appears moderately ill, very weak, fatigued, color pale.   HEENT: Normocephalic, atraumatic. Pupils equal, reactive to light. Conjunctivae clear. Sclerae anicteric.   NECK: Supple. Trachea midline. No lymphadenopathy or thyromegaly.   PULMONARY: Symmetric rise and fall of chest. Clear to auscultation throughout. No use of accessory muscles.   CARDIOVASCULAR: Regular rhythm, S1, S2. No murmurs. No gallops.   ABDOMEN: Soft, large, nondistended. No rebound tenderness. Marked discomfort epigastric right upper quadrant. Hypoactive bowel sounds.   RECTAL: Deferred.   MUSCULOSKELETAL: Moving all four extremities. No contractures. No clubbing.   EXTREMITIES: No edema.   PSYCH: Alert and oriented x4, slightly groggy as she had received Phenergan IV during interviewing process.   NEUROLOGICAL: No gross neurological defects.   LABORATORY/DIAGNOSTICS: Chemistry panel on admission glucose was elevated at 140, otherwise within normal limits. Hepatic panel AST 48, otherwise within normal limits. CK total 216 with a CK-MB of 14.6. Troponin less than 0.02; second in series CK total 125, CK-MB 6.9, and troponin less than 0.02; third in series CK total 105, CK-MB elevated at 3.8, and troponin less than 0.02. CBC within normal limits on admission as well as today's date. D-dimer was elevated at 0.49.   January 17th chemistry panel within normal limits except creatinine declined at 0.56. Lipase low at 65. Repeated later today at 13:39 amylase 41.  AST elevated at 46 which has consistently been elevated but otherwise panel within normal limits. EKG normal sinus rhythm. Abdomen flat and upright done today's date revealed there is evidence of air and fecal matter scattered in the bowel but no evidence of distention, pneumatosis, or free air being seen. Lung bases appear clear.   IMPRESSION:  1. Abdominal pain, right upper quadrant as well as epigastric, with associated intractable nausea and vomiting.  2. Known history of myalgic encephalopathy. 3. Chronic Lyme disease.  4. Chronic pain management. 5. Imaging studies reveal evidence of cholelithiasis.   PLAN: The patient's presentation was discussed with Dr. Verdie Shire. Do feel that possibly the finding of cholelithiasis is the underlying cause of the current symptoms which the patient is having. It is possible that she has passed gallstone but the patient remains in severe pain at this time with continued episodes of vomiting. The patient will be further evaluated by Dr. Verdie Shire and then further care management recommendations will follow in his documentation.   These services provided by Payton Emerald, NP under collaborative agreement with Verdie Shire, MD.  ____________________________ Payton Emerald, NP dsh:drc D: 11/12/2011 15:48:26 ET T: 11/12/2011 16:15:18 ET JOB#: [id number]  cc: Payton Emerald, NP, <Dictator> Payton Emerald MD ELECTRONICALLY SIGNED 11/13/2011 17:29

## 2015-02-17 NOTE — Consult Note (Signed)
O2 sat 89-92% on RA. EGD normal. Sxs likely from GB. Some abd pain. If sxs persist, will need GB surgery. Will sign off. Thanks  Electronic Signatures: Verdie Shire (MD)  (Signed on 18-Jan-13 16:10)  Authored  Last Updated: 18-Jan-13 16:10 by Verdie Shire (MD)

## 2015-02-17 NOTE — Consult Note (Signed)
Pt seen and examined. RUQ abd pain and tenderness. Hx of gallstones. T.bili normal now. If LFT rises, consider ERCP tomorrow. If LFT normal, then consider EGD though hx suggests GB disease. Will follow. NPO after MN in case endoscopy is needed. THanks  Electronic Signatures: Verdie Shire (MD)  (Signed on 17-Jan-13 18:58)  Authored  Last Updated: 17-Jan-13 18:58 by Verdie Shire (MD)

## 2015-02-17 NOTE — Op Note (Signed)
PATIENT NAME:  [name] Branch, ENGLAND MR#:  [id number] DATE OF BIRTH:  [birth year]  DATE OF PROCEDURE:  11/16/2011  PREOPERATIVE DIAGNOSIS: Acute cholecystitis.   POSTOPERATIVE DIAGNOSIS: Acute cholecystitis.   PROCEDURE: Laparoscopic cholecystectomy.   SURGEON: Rodena Goldmann, MD.   ANESTHESIA: General.   OPERATIVE PROCEDURE: With the patient in the supine position after the induction of appropriate general anesthesia, the patient's abdomen was prepped with ChloraPrep and draped with sterile towels. The patient was placed in the head down, feet up position. An infraumbilical incision was made in the standard fashion, carried down bluntly through the subcutaneous tissue. The Veress needle was used to cannulate the peritoneal cavity. CO2 was insufflated to appropriate pressure measurements. When approximately 2.5 liters of CO2 were instilled, the Veress needle was withdrawn. An 11 mm [employer] port was inserted into the peritoneal cavity. Intraperitoneal position was confirmed and CO2 was reinsufflated. The patient was placed in the head up, feet down position and rotated slightly to the left side. A subxiphoid transverse incision was made and an 11 mm port was inserted under direct vision. Two lateral ports 5 mm in size were inserted under direct vision. The gallbladder was completely distended and covered with omentum. The omentum was carefully dissected free. The gallbladder was inflamed and edematous. It was aspirated of about 50 mL of clear bile. The gallbladder was then grasped, retracted superiorly and laterally. There appeared to be a large impacted cystic duct stone. The duct was identified, doubly clipped and divided. The cystic artery was doubly clipped and divided. The gallbladder was then dissected free from its bed and delivered using hook and cautery apparatus. Once the gallbladder was free, the camera was switched to the subxiphoid port and the gallbladder was brought through the umbilical  port using the EndoCatch apparatus. A #19 [place] Blake drain was inserted through the umbilicus and brought out through one of the lateral stab wounds and placed in the bed of the liver. Drain was secured with 3-0 nylon. The abdomen was desufflated. The midline fascia was closed with figure-of-eight suture of 0 Vicryl. The skin was closed with 5-0 nylon. Sterile dressings were applied. The patient was returned to    the recovery room having tolerated the procedure well. Sponge, instrument, and needle counts were correct x2 in the Operating Room.   ____________________________ Micheline Maze, MD rle:ap D: 11/16/2011 10:11:52 ET T: 11/16/2011 12:00:57 ET JOB#: [id number]  cc: Micheline Maze, MD, <Dictator> [place] A. Glori Bickers, MD Lupita Dawn. Candace Cruise, MD  Rodena Goldmann MD ELECTRONICALLY SIGNED 11/18/2011 7:57

## 2015-02-17 NOTE — Discharge Summary (Signed)
PATIENT NAME:  [name], [name] MR#:  [id number] DATE OF BIRTH:  1955-11-28  DATE OF ADMISSION:  11/10/2011 DATE OF DISCHARGE:  11/19/2011  BRIEF HISTORY:  [name] Branch is a 59 year old woman with complex medical history admitted through the Emergency Room with possible viral syndrome. She has history of myalgic encephalopathy and had had a flulike illness for several days.  She had been coughing and had a low-grade fever with right chest and right upper abdominal pain. She has a long-standing history of chronic pain medicine abuse and was uncertain as to whether or not her nausea and pain were improving with the current regimen.  She presented to the Emergency Room where work-up suggested possible pulmonary edema, possible pneumonia. CT scan demonstrated no pulmonary embolism but mild evidence of cholelithiasis without evidence of cholecystitis. She was admitted to the hospital for cardiac work-up.  She was felt to have a mild flulike syndrome, possible pneumonia, and was started on supportive therapy. She continued to have abdominal discomfort and was seen by the GI service.  Upper endoscopy was performed on the eighteenth.  This demonstrated a normal esophagus, normal stomach, and normal duodenum. She continued to have right upper quadrant, right flank and chest pain. The surgical service was consulted. She had slight elevation in her liver function studies which suggested possible acute cholecystitis. Her pulmonary symptoms resolved. We felt that a large portion of her illness may have been related to symptomatic biliary tract disease as she had a history of chronic cholecystitis in the past. She was taken to surgery on the morning of January 21 where she underwent laparoscopic cholecystectomy. She did have acute cholecystitis. Cholangiography was not performed. She had evidence of biliary hydrops at the time of surgery. She had slow recovery from the surgery, requiring significant pain medication and  antiemetics but was able to take a regular diet at this point. Her wounds look good. Her drain has been removed. She has no particular postoperative complaints at this point. She is discharged home today to be followed in the office in 7 to 10 days' time. Bathing, activity, and driving instructions were given to the patient. She is to resume her home medications of extended release morphine 30 mg p.o. every four hours p.r.n. to a maximum of 15 tablets a day.    DISCHARGE DIAGNOSES:  Myalgic encephalopathy, acute cholecystitis.       PROCEDURE: Laparoscopic cholecystectomy.    ____________________________ Micheline Maze, MD rle:bjt D: 11/19/2011 08:49:09 ET T: 11/19/2011 13:13:54 ET JOB#: [id number]  cc: Micheline Maze, MD, <Dictator> Lupita Dawn. Candace Cruise, MD Wynelle Fanny Glori Bickers, MD Rodena Goldmann MD ELECTRONICALLY SIGNED 11/26/2011 12:25

## 2015-02-17 NOTE — Consult Note (Signed)
Brief Consult Note: Diagnosis: Sx Cholelithiasis, Viral URTI.   Patient was seen by consultant.   Consult note dictated.   Recommend further assessment or treatment.   Orders entered.   Comments: Possible CBD stone or passed CBD stone. Clinically better from Sx Cholelithiasis. Unless her LFTs continue to increase I would prefer to postpone an elective Lap CCY until her viral illness has resolved.  Electronic Signatures: Consuela Mimes (MD)  (Signed 16-Jan-13 21:25)  Authored: Brief Consult Note   Last Updated: 16-Jan-13 21:25 by Consuela Mimes (MD)

## 2015-04-04 ENCOUNTER — Telehealth: Payer: Self-pay | Admitting: Internal Medicine

## 2015-04-05 NOTE — Telephone Encounter (Signed)
Patient would like to switch to Dr. Hilarie Fredrickson from Dr. Carlean Purl.  She states "nothing against Dr. Carlean Purl, but I have a very complicated medical history and feel I need Dr. Hilarie Fredrickson" Dr. Carlean Purl do you agree with the switch

## 2015-04-08 NOTE — Telephone Encounter (Signed)
[place] for new patient appt, double slot please

## 2015-04-08 NOTE — Telephone Encounter (Signed)
Dr. Pyrtle will you accept 

## 2015-04-08 NOTE — Telephone Encounter (Signed)
OK with me.

## 2015-04-09 NOTE — Telephone Encounter (Signed)
Pt scheduled to see Dr. Hilarie Fredrickson 06/13/15@1 :45pm. Pt aware.

## 2015-04-11 ENCOUNTER — Telehealth: Payer: Self-pay | Admitting: Family Medicine

## 2015-04-11 NOTE — Telephone Encounter (Signed)
[facility]     Patient Name: [name] Branch Day - Client    Client Site [place] - Day    Physician [place], [place] Type Call  Gender: Female Call Type Triage / Clinical  DOB: [birth year]  Relationship To Patient Self  Age: 59 Y 11 M 4 D Return Phone Number [phone] (Primary)  Return Phone Number: [phone] (Primary) Chief Complaint Constipation  Address: [place] states they are having constipation along with some lumps in the area.  City/State/Zip: [place] [address] PreDisposition Go to Urgent Care/Walk-In Clinic    Nurse Assessment  Nurse: Amalia Hailey, RN, Lenna Sciara Date/Time Eilene Ghazi Time): 04/11/2015 4:19:11 PM  Confirm and document reason for call. If symptomatic, describe symptoms. ---Caller states they are having constipation along with some lumps in the area.   Has the patient traveled out of the country within the last 30 days? ---Not Applicable   Does the patient require triage? ---Yes   Related visit to physician within the last 2 weeks? ---No   Does the PT have any chronic conditions? (i.e. diabetes, asthma, etc.) ---Yes   List chronic conditions. 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 Time Eilene Ghazi Time) Disposition Final User         04/11/2015 4:30:01 PM See Physician within 24 Hours Yes Amalia Hailey, RN, Lenna Sciara         Caller Understands: Yes   Disagree/Comply: Comply      Care Advice  Given Per Guideline         SEE PHYSICIAN WITHIN 24 HOURS: * IF OFFICE WILL BE OPEN: You need to be examined within the next 24 hours. Call your doctor when the office opens, and make an appointment. DIET: * Avoid greasy or fatty foods. * Avoid alcohol or caffeinated beverages * Drink adequate fluids. Eat a bland diet. CALL BACK IF: * You become worse. * Severe pain lasts over 1 hour * Constant pain lasts over 2 hours   After Care Instructions Given     Call Event Type User Date / Time Description               Referrals   REFERRED TO PCP OFFICE

## 2015-04-11 NOTE — Telephone Encounter (Signed)
PLEASE NOTE: All timestamps contained within this report are represented as [place] Standard Time. CONFIDENTIALTY NOTICE: This fax transmission is intended only for the addressee. It contains information that is legally privileged, confidential or otherwise protected from use or disclosure. If you are not the intended recipient, you are strictly prohibited from reviewing, disclosing, copying using or disseminating any of this information or taking any action in reliance on or regarding this information. If you have received this fax in error, please notify us immediately by telephone so that we can arrange for its return to [place]. Phone: [phone], Toll-Free: [phone], Fax: [phone] Page: 1 of 2 Call Id: [id number] [place] Patient Name: [name] Branch Gender: Female DOB: [birth year] Age: 59 Y 11 M 4 D Return Phone Number: [phone] (Primary) Address: [place]/State/Zip: [place] [address] Client [facility] - Client Client Site [place] - Day Physician [facility], Roque Lias Contact Type Call Call Type Triage / Clinical Relationship To Patient Self Appointment Disposition EMR Patient Refused Appointment Info pasted into Epic Yes Return Phone Number [phone] (Primary) Chief Complaint Constipation Initial Comment Caller states they are having constipation along with some lumps in the area. PreDisposition Go to Urgent Care/Walk-In Clinic Nurse Assessment Nurse: Amalia Hailey, RN, Melissa Date/Time Eilene Ghazi Time): 04/11/2015 4:19:11 PM Confirm and document reason for call. If symptomatic, describe symptoms. ---Caller states they are having constipation along with some lumps in the area. Has the patient traveled out of the country within the last 30 days? ---Not Applicable Does the patient require triage? ---Yes Related visit to  physician within the last 2 weeks? ---No Does the PT have any chronic conditions? (i.e. diabetes, asthma, etc.) ---Yes List chronic conditions. ---hx umbilical hernia, scar tissue on bottom right lung, "trouble breathing"-referred to the Pulmonolgist.Hysterectomy, back surgery, Guidelines Guideline Title Affirmed Question Affirmed Notes Nurse Date/Time Eilene Ghazi Time) Abdominal Pain - Female [1] MILD (e.g., does not interfere with normal activities) AND [2] pain comes and goes (cramps) AND [3] present > 48 hours Evans, RN, Melissa 04/11/2015 4:23:55 PM Disp. Time Eilene Ghazi Time) Disposition Final User 04/11/2015 4:30:01 PM See Physician within 24 Hours Yes Amalia Hailey, RN, Melissa PLEASE NOTE: All timestamps contained within this report are represented as [place] Standard Time. CONFIDENTIALTY NOTICE: This fax transmission is intended only for the addressee. It contains information that is legally privileged, confidential or otherwise protected from use or disclosure. If you are not the intended recipient, you are strictly prohibited from reviewing, disclosing, copying using or disseminating any of this information or taking any action in reliance on or regarding this information. If you have received this fax in error, please notify us immediately by telephone so that we can arrange for its return to [place]. Phone: [phone], Toll-Free: [phone], Fax: [phone] Page: 2 of 2 Call Id: [id number] Caller Understands: Yes Disagree/Comply: Comply Care Advice Given Per Guideline SEE PHYSICIAN WITHIN 24 HOURS: * IF OFFICE WILL BE OPEN: You need to be examined within the next 24 hours. Call your doctor when the office opens, and make an appointment. DIET: * Avoid greasy or fatty foods. * Avoid alcohol or caffeinated beverages * Drink adequate fluids. Eat a bland diet. CALL BACK IF: * You become worse. * Severe pain lasts over 1 hour * Constant pain lasts over 2 hours After Care Instructions  Given Call Event Type User Date / Time Description Comments User: Colin Ina, RN  Date/Time Eilene Ghazi Time): 04/11/2015 4:47:57 PM Caller was informed of the office appts. available for Friday 04/12/15 @ the [facility] office with other practitioners, caller prefers to be seen by her PCP and not with someone else, asking for appt. for Monday with Dr. Glori Bickers. Caller advised will inform the office that she prefers the appt on Monday with her provider. Caller reports if she gets worse she will go to UC. Caller advised to call back to nurse triage if needed, available 24/7. Caller verb. understood this information. Referrals REFERRED TO PCP OFFICE

## 2015-04-11 NOTE — Telephone Encounter (Signed)
Tower is PCP

## 2015-04-11 NOTE — Telephone Encounter (Signed)
If my schedule is full on Friday- she will have to f/u with me when I return

## 2015-04-11 NOTE — Telephone Encounter (Deleted)
[facility]     Patient Name: [name] Branch - Client    Client Site [place] [facility]    Physician [facility], [place] Type Call  Gender: Female Call Type Triage / Clinical  DOB: 02-Jun-1956  Relationship To Patient Self  Age: 59 Y 11 M 4 D Return Phone Number [phone] (Primary)  Return Phone Number: [phone] (Primary) Chief Complaint Constipation  Address: [place] states they are having constipation along with some lumps in the area.  City/State/Zip: [place] [address] PreDisposition Go to Urgent Care/Walk-In Clinic    Nurse Assessment  Nurse: Amalia Hailey, RN, Lenna Sciara Date/Time Eilene Ghazi Time): 04/11/2015 4:19:11 PM  Confirm and document reason for call. If symptomatic, describe symptoms. ---Caller states they are having constipation along with some lumps in the area.   Has the patient traveled out of the country within the last 30 days? ---Not Applicable   Does the patient require triage? ---Yes   Related visit to physician within the last 2 weeks? ---No   Does the PT have any chronic conditions? (i.e. diabetes, asthma, etc.) ---Yes   List chronic conditions. 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 Time Eilene Ghazi Time) Disposition Final User         04/11/2015 4:30:01 PM See Physician within 24 Hours Yes Amalia Hailey, RN, Lenna Sciara         Caller Understands: Yes   Disagree/Comply: Comply      Care Advice  Given Per Guideline         SEE PHYSICIAN WITHIN 24 HOURS: * IF OFFICE WILL BE OPEN: You need to be examined within the next 24 hours. Call your doctor when the office opens, and make an appointment. DIET: * Avoid greasy or fatty foods. * Avoid alcohol or caffeinated beverages * Drink adequate fluids. Eat a bland diet. CALL BACK IF: * You become worse. * Severe pain lasts over 1 hour * Constant pain lasts over 2 hours   After Care Instructions Given     Call Event Type User Date / Time Description               Referrals   REFERRED TO PCP OFFICE

## 2015-04-12 ENCOUNTER — Encounter: Payer: Self-pay | Admitting: Family Medicine

## 2015-04-12 ENCOUNTER — Ambulatory Visit (INDEPENDENT_AMBULATORY_CARE_PROVIDER_SITE_OTHER): Payer: 59 | Admitting: Family Medicine

## 2015-04-12 VITALS — BP 132/76 | HR 113 | Temp 98.9°F | Ht 65.0 in | Wt 190.2 lb

## 2015-04-12 DIAGNOSIS — N8110 Cystocele, unspecified: Secondary | ICD-10-CM

## 2015-04-12 DIAGNOSIS — K5900 Constipation, unspecified: Secondary | ICD-10-CM | POA: Insufficient documentation

## 2015-04-12 DIAGNOSIS — K5909 Other constipation: Secondary | ICD-10-CM | POA: Diagnosis not present

## 2015-04-12 DIAGNOSIS — IMO0002 Reserved for concepts with insufficient information to code with codable children: Secondary | ICD-10-CM | POA: Insufficient documentation

## 2015-04-12 NOTE — Patient Instructions (Signed)
Do follow up with GI as planned I think you have a cystocele (bladder is prolapsing through vaginal wall)  I want you to keep drinking lots of fluids  Try miralax three times daily until your bowels start to move - if no improvement in a week please let me know

## 2015-04-12 NOTE — Telephone Encounter (Signed)
appt scheduled today (Friday) with Dr. Glori Bickers

## 2015-04-12 NOTE — Progress Notes (Signed)
Pre visit review using our clinic review tool, if applicable. No additional management support is needed unless otherwise documented below in the visit note. 

## 2015-04-12 NOTE — Progress Notes (Signed)
Subjective:    Patient ID: [name] Branch, female    DOB: 1955/11/12, 59 y.o.   MRN: [id number]  HPI Here with several issues   Wt is up 5 lb   Constipation -much worse lately  No blood in stool Still has not had a colonoscopy- but does have an appt in 3 mo  A lot of flatulence and occ mucous  Lucky if she has 3 bm per week  Some pain on and off in L lower abdomen  Nausea also   Diet has not changes    Of note-on morphine  Also fiber  Tried miralax Tried stool softeners  Drinks lots of water     Went to urgent care when she saw a bulge in the vulvar area/not painful  Pink in color  No urinary symptoms   Some lumps on abdomen   Patient Active Problem List   Diagnosis Date Noted  . Routine general medical examination at a health care facility 06/26/2014  . Osteopenia 06/26/2014  . Hyperglycemia 06/26/2014  . Colon cancer screening 06/26/2014  . Dysuria 06/26/2014  . Intertrigo 07/12/2013  . Chronic fatigue syndrome 01/19/2013  . Elevated alkaline phosphatase level 09/19/2012  . Edema 09/16/2012  . FREQUENCY, URINARY 08/06/2010  . Diarrhea 04/14/2010  . CHOLELITHIASIS 01/02/2009  . CONSTIPATION, CHRONIC 07/30/2008  . Irritable bowel syndrome 07/30/2008  . PURE HYPERCHOLESTEROLEMIA 06/21/2008  . POSTMENOPAUSAL STATUS 06/21/2008  . VAGINITIS, ATROPHIC 06/21/2008  . Pain in limb 06/21/2008   Past Medical History  Diagnosis Date  . 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 Dr. Cristy Friedlander   . Allergic rhinitis   . Migraine   . Chronic gastritis   . Hyperlipidemia   . West Nile Virus infection 2003  . Lyme disease     "stari"   Past Surgical History  Procedure Laterality Date  . Tonsillectomy    . Partial hysterectomy      bleeding  . Ulnar nerve repair      sx times 5  . Laprascopy    . Breast biopsy  '04    neg; right  . 5th r toe fx  '09  . Upper gastrointestinal endoscopy  12/04/2004   gastritis   History  Substance Use Topics  . Smoking status: Never Smoker   . Smokeless tobacco: Never Used     Comment: non smoker  . Alcohol Use: No   Family History  Problem Relation Age of Onset  . Bipolar disorder      some family  . Colon cancer Neg Hx   . Breast cancer Paternal Grandmother   . Diabetes Father   . Hypertension Father   . Stroke Father   . Hypertension Mother   . Cancer Mother     [name]  . Alcohol abuse Maternal Grandfather   . Ulcers Mother    Allergies  Allergen Reactions  . Sulfonamide Derivatives    Current Outpatient Prescriptions on File Prior to Visit  Medication Sig Dispense Refill  . Calcium-Magnesium-Vitamin D (CALCIUM MAGNESIUM PO) Take 1 tablet by mouth daily.    . Cholecalciferol (VITAMIN D3) 5000 UNITS TABS Take 1 tablet by mouth daily.      Marland Kitchen FIBER SELECT GUMMIES PO Take 1-3 tablets by mouth daily.    Marland Kitchen ketoconazole (NIZORAL) 2 % cream Apply topically daily. To affected areas 30 g 1  . modafinil (PROVIGIL) 100 MG tablet Take by mouth. Take 25-50  mg prn    . morphine (MSIR) 30 MG tablet Take 30 mg by mouth every 3 (three) hours as needed. UAD PRN    . Multiple Vitamin (MULTIVITAMIN) tablet Take 1 tablet by mouth daily.      . promethazine (PHENERGAN) 25 MG tablet Take 1 tablet (25 mg total) by mouth every 6 (six) hours as needed for nausea. 30 tablet 0  . terconazole (TERAZOL 7) 0.4 % vaginal cream Place 1 applicator vaginally at bedtime. 45 g 0   No current facility-administered medications on file prior to visit.      Review of Systems Review of Systems  Constitutional: Negative for fever, appetite change,  and unexpected weight change. pos for chronic fatigue  Eyes: Negative for pain and visual disturbance.  Respiratory: Negative for cough and shortness of breath.   Cardiovascular: Negative for cp or palpitations    Gastrointestinal: Negative for diarrhea, blood in stool, dark stool  Genitourinary: Negative for urgency  and frequency. Pos for need to strain to urinate some times, neg for dysuria  Skin: Negative for pallor or rash   Neurological: Negative for weakness, light-headedness, numbness and headaches.  Hematological: Negative for adenopathy. Does not bruise/bleed easily.  Psychiatric/Behavioral: Negative for dysphoric mood. The patient is not nervous/anxious.         Objective:   Physical Exam  Constitutional: She appears well-developed and well-nourished. No distress.  obese and fatigued appearing   Walks with a cane   Eyes: Conjunctivae and EOM are normal. Pupils are equal, round, and reactive to light. Right eye exhibits no discharge. Left eye exhibits no discharge. No scleral icterus.  Neck: Normal range of motion. Neck supple.  Cardiovascular: Normal rate and regular rhythm.   Pulmonary/Chest: Effort normal and breath sounds normal. No respiratory distress. She has no wheezes. She has no rales.  Abdominal: Soft. Bowel sounds are normal. She exhibits no distension and no mass. There is no tenderness. There is no rebound and no guarding.  Genitourinary:  Cystocele- mild noted on pelvic exam  Pelvic floor strength is relatively weak   Musculoskeletal: She exhibits no edema.  Poor rom spine and knees  Gait is slow with cane  Lymphadenopathy:    She has no cervical adenopathy.  Neurological: She is alert. She has normal reflexes.  Skin: Skin is warm and dry. No rash noted. No erythema. No pallor.  Psychiatric: She has a normal mood and affect.          Assessment & Plan:   Problem List Items Addressed This Visit    Constipation - Primary    Acute on chronic  Disc use of miralax up to tid until your bowels start to move  F/u with GI as planned       Cystocele    Noticeable but not bothersome  No incontinence Pt does not wish to seek tx yet  Will continue to follow

## 2015-04-14 NOTE — Assessment & Plan Note (Signed)
Acute on chronic  Disc use of miralax up to tid until your bowels start to move  F/u with GI as planned

## 2015-04-14 NOTE — Assessment & Plan Note (Signed)
Noticeable but not bothersome  No incontinence Pt does not wish to seek tx yet  Will continue to follow

## 2015-05-28 ENCOUNTER — Ambulatory Visit (INDEPENDENT_AMBULATORY_CARE_PROVIDER_SITE_OTHER): Payer: 59 | Admitting: Family Medicine

## 2015-05-28 ENCOUNTER — Encounter: Payer: Self-pay | Admitting: Family Medicine

## 2015-05-28 VITALS — BP 116/68 | HR 108 | Temp 98.8°F | Ht 65.0 in | Wt 173.0 lb

## 2015-05-28 DIAGNOSIS — R309 Painful micturition, unspecified: Secondary | ICD-10-CM | POA: Diagnosis not present

## 2015-05-28 DIAGNOSIS — R300 Dysuria: Secondary | ICD-10-CM

## 2015-05-28 LAB — POCT URINALYSIS DIPSTICK
Bilirubin, UA: NEGATIVE
GLUCOSE UA: NEGATIVE
Ketones, UA: NEGATIVE
Nitrite, UA: NEGATIVE
PROTEIN UA: NEGATIVE
RBC UA: NEGATIVE
SPEC GRAV UA: 1.01
UROBILINOGEN UA: 0.2
pH, UA: 6.5

## 2015-05-28 NOTE — Patient Instructions (Signed)
I will culture your urine  Keep drinking water  Stay away from acidic beverage /diet drinks/spicy food etc  If symptoms worsen let me know  Follow up with urology as planned

## 2015-05-28 NOTE — Assessment & Plan Note (Signed)
ua is fairly bland -sent for cx in light of pt hx of past uti Enc water Disc food/beverages and bladder spasms  Disc poss urethritis No vaginal symptoms  Pend urine cx-will tx if pos  F/u urol as planned

## 2015-05-28 NOTE — Progress Notes (Signed)
Subjective:    Patient ID: [name] Branch, female    DOB: August 27, 1956, 59 y.o.   MRN: [id number]  HPI Here for urinary symptoms - 2 weeks  Drank lots of water  Wants to avoid abx if poss   She has had resistant utis in the past   Frequent urination-not unusual  Pain to urinate - a chill comes over her and a deep ache A low grade temp on and off last 24 hours   No blood in urine that she can see recently  No new odor  She has some flank pain - both sides occ-  ? If related   No worse incontinence than usual   No vaginal discharge   Has cystocele known     Results for orders placed or performed in visit on 05/28/15  Urinalysis Dipstick  Result Value Ref Range   Color, UA yellow    Clarity, UA hazy    Glucose, UA negative    Bilirubin, UA negative    Ketones, UA negative    Spec Grav, UA 1.010    Blood, UA negative    pH, UA 6.5    Protein, UA negative    Urobilinogen, UA 0.2    Nitrite, UA negative    Leukocytes, UA small (1+) (A) Negative      Wt is down 17 lb   Patient Active Problem List   Diagnosis Date Noted  . Constipation 04/12/2015  . Cystocele 04/12/2015  . Routine general medical examination at a health care facility 06/26/2014  . Osteopenia 06/26/2014  . Hyperglycemia 06/26/2014  . Colon cancer screening 06/26/2014  . Dysuria 06/26/2014  . Intertrigo 07/12/2013  . Chronic fatigue syndrome 01/19/2013  . Elevated alkaline phosphatase level 09/19/2012  . Edema 09/16/2012  . FREQUENCY, URINARY 08/06/2010  . Diarrhea 04/14/2010  . CHOLELITHIASIS 01/02/2009  . CONSTIPATION, CHRONIC 07/30/2008  . Irritable bowel syndrome 07/30/2008  . PURE HYPERCHOLESTEROLEMIA 06/21/2008  . POSTMENOPAUSAL STATUS 06/21/2008  . VAGINITIS, ATROPHIC 06/21/2008  . Pain in limb 06/21/2008   Past Medical History  Diagnosis Date  . 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 Dr. Cristy Friedlander   . Allergic  rhinitis   . Migraine   . Chronic gastritis   . Hyperlipidemia   . West Nile Virus infection 2003  . Lyme disease     "stari"   Past Surgical History  Procedure Laterality Date  . Tonsillectomy    . Partial hysterectomy      bleeding  . Ulnar nerve repair      sx times 5  . Laprascopy    . Breast biopsy  '04    neg; right  . 5th r toe fx  '09  . Upper gastrointestinal endoscopy  12/04/2004    gastritis   History  Substance Use Topics  . Smoking status: Never Smoker   . Smokeless tobacco: Never Used     Comment: non smoker  . Alcohol Use: No   Family History  Problem Relation Age of Onset  . Bipolar disorder      some family  . Colon cancer Neg Hx   . Breast cancer Paternal Grandmother   . Diabetes Father   . Hypertension Father   . Stroke Father   . Hypertension Mother   . Cancer Mother     [name]  . Alcohol abuse Maternal Grandfather   . Ulcers Mother    Allergies  Allergen Reactions  . Sulfonamide Derivatives    Current Outpatient Prescriptions on File Prior to Visit  Medication Sig Dispense Refill  . Calcium-Magnesium-Vitamin D (CALCIUM MAGNESIUM PO) Take 1 tablet by mouth daily.    . Cholecalciferol (VITAMIN D3) 5000 UNITS TABS Take 1 tablet by mouth daily.      Marland Kitchen FIBER SELECT GUMMIES PO Take 1-3 tablets by mouth daily.    . modafinil (PROVIGIL) 100 MG tablet Take by mouth. Take 25-50 mg prn    . morphine (MSIR) 30 MG tablet Take 30 mg by mouth every 3 (three) hours as needed. UAD PRN    . Multiple Vitamin (MULTIVITAMIN) tablet Take 1 tablet by mouth daily.      . promethazine (PHENERGAN) 25 MG tablet Take 1 tablet (25 mg total) by mouth every 6 (six) hours as needed for nausea. 30 tablet 0  . ketoconazole (NIZORAL) 2 % cream Apply topically daily. To affected areas (Patient not taking: Reported on 05/28/2015) 30 g 1  . terconazole (TERAZOL 7) 0.4 % vaginal cream Place 1 applicator vaginally at bedtime. (Patient not taking: Reported on 05/28/2015) 45 g 0     No current facility-administered medications on file prior to visit.      Review of Systems Review of Systems  Constitutional: Negative for fever, appetite change, fatigue and unexpected weight change.  Eyes: Negative for pain and visual disturbance.  Respiratory: Negative for cough and shortness of breath.   Cardiovascular: Negative for cp or palpitations    Gastrointestinal: Negative for nausea, diarrhea and constipation.  Genitourinary: pos  for urgency and frequency. pos for baseline incontinence , neg for gross hematuria  Skin: Negative for pallor or rash   Neurological: Negative for weakness, light-headedness, numbness and headaches.  Hematological: Negative for adenopathy. Does not bruise/bleed easily.  Psychiatric/Behavioral: Negative for dysphoric mood. The patient is not nervous/anxious.         Objective:   Physical Exam  Constitutional: She appears well-developed and well-nourished. No distress.  HENT:  Head: Normocephalic and atraumatic.  Eyes: Conjunctivae and EOM are normal. Pupils are equal, round, and reactive to light. No scleral icterus.  Neck: Normal range of motion. Neck supple.  Cardiovascular: Normal rate and regular rhythm.   Pulmonary/Chest: Effort normal and breath sounds normal.  Abdominal: Soft. Bowel sounds are normal. She exhibits no distension and no mass. There is tenderness. There is no rebound and no guarding.  Mild suprapubic tenderness No rebound or guarding No significant cva tenderness   Neurological: She is alert.  Skin: Skin is warm and dry. No rash noted. No pallor.  Psychiatric: She has a normal mood and affect.          Assessment & Plan:   Problem List Items Addressed This Visit    Dysuria    ua is fairly bland -sent for cx in light of pt hx of past uti Enc water Disc food/beverages and bladder spasms  Disc poss urethritis No vaginal symptoms  Pend urine cx-will tx if pos  F/u urol as planned       Relevant Orders    Urine culture    Other Visit Diagnoses    Painful urination    -  Primary    Relevant Orders    Urinalysis Dipstick (Completed)

## 2015-05-28 NOTE — Progress Notes (Signed)
Pre visit review using our clinic review tool, if applicable. No additional management support is needed unless otherwise documented below in the visit note. 

## 2015-05-31 ENCOUNTER — Encounter: Payer: Self-pay | Admitting: *Deleted

## 2015-05-31 ENCOUNTER — Telehealth: Payer: Self-pay

## 2015-05-31 MED ORDER — CEPHALEXIN 250 MG PO CAPS: 250.0000 mg | ORAL_CAPSULE | Freq: Two times a day (BID) | ORAL | Status: DC

## 2015-05-31 NOTE — Telephone Encounter (Signed)
Culture was re incubated for better growth so I don't have a result yet [name] Branch- can you please check to see what the timeline would be? Thanks Will route to [place]

## 2015-05-31 NOTE — Telephone Encounter (Signed)
Pt  Was seen 05/28/15 and pt request lab results ASAP pt is actually feeling worse; when urinates has deep ache and feels like insides are going to come out; extreme urgency to urinate, nausea, shaky, h/a and low grade fever. [facility] [place]. Pt request cb.

## 2015-05-31 NOTE — Telephone Encounter (Signed)
No result yet-and likely not until Monday- they had to re incubate it for better growth  If she is apprehensive about holding out until then -please call her in keflex 250 mg 1 po bid for 3 d # 6 no refills If much worse of course go to ER Hopefully I will have a result for her soon  Thanks

## 2015-05-31 NOTE — Telephone Encounter (Signed)
Per Aniceto Boss when the Preliminary results says that it usually means they have to regrow it again so it could take up to 3 days to get the results back so we may have a result back on Monday at the earliest

## 2015-05-31 NOTE — Telephone Encounter (Signed)
It was re set up yesterday, no results until this weekend,

## 2015-05-31 NOTE — Telephone Encounter (Signed)
Pt advise of Dr. Marliss Coots instructions/recommendations. Pt said she will try to hold off until urine cx returns but requested I send in abx incase sxs worsen over weekend she has meds available. Done and pt notified

## 2015-06-02 LAB — URINE CULTURE

## 2015-06-03 ENCOUNTER — Telehealth: Payer: Self-pay | Admitting: Family Medicine

## 2015-06-03 MED ORDER — CEPHALEXIN 250 MG PO CAPS: 250.0000 mg | ORAL_CAPSULE | Freq: Two times a day (BID) | ORAL | Status: DC

## 2015-06-03 NOTE — Telephone Encounter (Signed)
Rx sent to pharmacy and pt notified of Dr.Tower's instructions

## 2015-06-03 NOTE — Telephone Encounter (Signed)
Please let her know urine grew out group B strep- usually a vaginal contaminant - does not usually cause symptoms but it can  She is on keflex which should cover it  Please call in 5 more days (I think we started with 3)  Alert [place] if no improvement

## 2015-06-04 ENCOUNTER — Telehealth: Payer: Self-pay

## 2015-06-04 DIAGNOSIS — R110 Nausea: Secondary | ICD-10-CM

## 2015-06-04 MED ORDER — ONDANSETRON 4 MG PO TBDP: 4.0000 mg | ORAL_TABLET | Freq: Three times a day (TID) | ORAL | Status: DC | PRN

## 2015-06-04 NOTE — Telephone Encounter (Signed)
Generic Zofran sent to pharmacy per patient's request.

## 2015-06-04 NOTE — Telephone Encounter (Signed)
Pt notified Rx sent to pharmacy

## 2015-06-04 NOTE — Telephone Encounter (Signed)
Pt left v/m; whenever pt takes abx pt has N&V. Pt request 8 day supply of generic zofran sent to [facility]. Please advise.

## 2015-06-12 ENCOUNTER — Ambulatory Visit: Payer: Self-pay | Admitting: Internal Medicine

## 2015-06-13 ENCOUNTER — Encounter: Payer: Self-pay | Admitting: Internal Medicine

## 2015-06-13 ENCOUNTER — Ambulatory Visit (INDEPENDENT_AMBULATORY_CARE_PROVIDER_SITE_OTHER): Payer: 59 | Admitting: Internal Medicine

## 2015-06-13 VITALS — BP 122/60 | HR 62 | Ht 65.0 in | Wt 175.0 lb

## 2015-06-13 DIAGNOSIS — Z1211 Encounter for screening for malignant neoplasm of colon: Secondary | ICD-10-CM

## 2015-06-13 DIAGNOSIS — K589 Irritable bowel syndrome without diarrhea: Secondary | ICD-10-CM

## 2015-06-13 DIAGNOSIS — R1314 Dysphagia, pharyngoesophageal phase: Secondary | ICD-10-CM

## 2015-06-13 DIAGNOSIS — R140 Abdominal distension (gaseous): Secondary | ICD-10-CM | POA: Diagnosis not present

## 2015-06-13 DIAGNOSIS — N816 Rectocele: Secondary | ICD-10-CM

## 2015-06-13 DIAGNOSIS — R1319 Other dysphagia: Secondary | ICD-10-CM

## 2015-06-13 DIAGNOSIS — R1310 Dysphagia, unspecified: Secondary | ICD-10-CM

## 2015-06-13 MED ORDER — NA SULFATE-K SULFATE-MG SULF 17.5-3.13-1.6 GM/177ML PO SOLN: ORAL | Status: DC

## 2015-06-13 NOTE — Patient Instructions (Signed)

## 2015-06-13 NOTE — Progress Notes (Signed)
Patient ID: [name] Branch, female   DOB: 1956-05-21, 59 y.o.   MRN: [id number] HPI: [name] Branch is a 59 year old female with CFIDs and narcolepsy, migraines, IBS with chronic constipation, rectal and bladder prolapse who is seen in consultation at the request of Dr. Glori Bickers to consider colonoscopy and also with history of dysphagia. She is here alone today. She was previously seen by Dr. Carlean Purl the last in the office in December 2012. She reports that recently she has been diagnosed with bladder and rectal prolapse. She's been seen by lots urology and imaging studies are planned. She is uncertain but no she may require surgery. Over the last several months she was dealing with severe constipation having a bowel movement 1-2 days per week. Dr. Glori Bickers have been treating with MiraLAX one to 3 times daily. She reports that recently constipation is been less of an issue for her she feels that this is due to worsening of her bladder and rectal prolapse. She feels that since her bladder has moved down she can pass her stools more easily. She is no longer using MiraLAX or other laxative. She is taking a daily probiotic. She was recently treated with antibiotics for UTI and with this she's had some minor loose stools. She has smaller scant rectal bleeding on occasion but this is not an ongoing or daily issue. She reports that she is eating well though she has intermittent nausea and occasional vomiting. She reports some dysphagia which has developed over the last several months. This seems to be worse with liquid or pills. She chews her food well and usually has no trouble swallowing solid foods. On one occasion she reports a pill became lodged in her chest and it sounds as if she was briefly obstructed in the esophagus. This did pass on its own. She does report frequent abdominal bloating and she is a simethicone which helps intermittently. She does take morphine and is treated for chronic pain by pain specialist in  [facility].  She has never had a screening colonoscopy. She recalls polyps in both her mother and father. There is no known family history of GI tract malignancy.  Past Medical History  Diagnosis Date  . 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 Dr. Cristy Friedlander   . Allergic rhinitis   . Migraine   . Chronic gastritis   . Hyperlipidemia   . West Nile Virus infection 2003  . Lyme disease     "stari"  . IBS (irritable bowel syndrome)   . Vaginal prolapse   . Rectal prolapse   . Umbilical hernia     Past Surgical History  Procedure Laterality Date  . Tonsillectomy    . Partial hysterectomy      bleeding  . Ulnar nerve repair      sx times 5  . Laprascopy    . Breast biopsy  '04    neg; right  . 5th r toe fx  '09  . Upper gastrointestinal endoscopy  12/04/2004    gastritis    Outpatient Prescriptions Prior to Visit  Medication Sig Dispense Refill  . Calcium-Magnesium-Vitamin D (CALCIUM MAGNESIUM PO) Take 1 tablet by mouth daily.    . Cholecalciferol (VITAMIN D3) 5000 UNITS TABS Take 1 tablet by mouth daily.      . Cyanocobalamin (VITAMIN B 12 PO) Take by mouth as needed.    Marland Kitchen FIBER SELECT GUMMIES PO Take 1-3 tablets by mouth  daily.    . modafinil (PROVIGIL) 100 MG tablet Take by mouth. Take 25-50 mg prn    . morphine (MSIR) 30 MG tablet Take 30 mg by mouth every 3 (three) hours as needed. UAD PRN    . Multiple Vitamin (MULTIVITAMIN) tablet Take 1 tablet by mouth daily.      . ondansetron (ZOFRAN ODT) 4 MG disintegrating tablet Take 1 tablet (4 mg total) by mouth every 8 (eight) hours as needed for nausea or vomiting. 20 tablet 0  . promethazine (PHENERGAN) 25 MG tablet Take 1 tablet (25 mg total) by mouth every 6 (six) hours as needed for nausea. 30 tablet 0  . cephALEXin (KEFLEX) 250 MG capsule Take 1 capsule (250 mg total) by mouth 2 (two) times daily. For three (8) days 16 capsule 0  . ketoconazole (NIZORAL) 2 % cream  Apply topically daily. To affected areas (Patient not taking: Reported on 05/28/2015) 30 g 1  . terconazole (TERAZOL 7) 0.4 % vaginal cream Place 1 applicator vaginally at bedtime. (Patient not taking: Reported on 05/28/2015) 45 g 0   No facility-administered medications prior to visit.    Allergies  Allergen Reactions  . Sulfonamide Derivatives     Family History  Problem Relation Age of Onset  . Bipolar disorder      some family  . Colon cancer Neg Hx   . Breast cancer Paternal Grandmother   . Diabetes Father   . Hypertension Father   . Stroke Father   . Hypertension Mother   . Cancer Mother     [name]  . Alcohol abuse Maternal Grandfather   . Ulcers Mother     Social History  Substance Use Topics  . Smoking status: Never Smoker   . Smokeless tobacco: Never Used     Comment: non smoker  . Alcohol Use: No    ROS: As per history of present illness, otherwise negative  BP 122/60 mmHg  Pulse 62  Ht 5\' 5"  (1.651 m)  Wt 175 lb (79.379 kg)  BMI 29.12 kg/m2 Constitutional: Well-developed and well-nourished. No distress. HEENT: Normocephalic and atraumatic. Oropharynx is clear and moist. No oropharyngeal exudate. Conjunctivae are normal.  No scleral icterus. Neck: Neck supple. Trachea midline. Cardiovascular: Normal rate, regular rhythm and intact distal pulses. No M/R/G Pulmonary/chest: Effort normal and breath sounds normal. No wheezing, rales or rhonchi. Abdominal: Soft, nontender, nondistended. Bowel sounds active throughout. There are no masses palpable. No hepatosplenomegaly. Extremities: no clubbing, cyanosis, or edema Lymphadenopathy: No cervical adenopathy noted. Neurological: Alert and oriented to person place and time. Skin: Skin is warm and dry. No rashes noted. Psychiatric: Normal mood and affect. Behavior is normal.  RELEVANT LABS AND IMAGING: CBC    Component Value Date/Time   WBC 7.6 06/26/2014 1133   WBC 9.5 11/17/2011 0249   WBC 5.4 12/31/2008  0000   RBC 5.13 06/26/2014 1133   RBC 4.71 11/17/2011 0249   RBC 4.78 12/31/2008 0000   HGB 14.3 06/26/2014 1133   HGB 13.0 11/17/2011 0249   HCT 42.9 06/26/2014 1133   HCT 38.8 11/17/2011 0249   PLT 373 11/17/2011 0249   PLT 309 12/31/2008 0000   MCV 84 06/26/2014 1133   MCV 82 11/17/2011 0249   MCH 27.9 06/26/2014 1133   MCH 27.6 11/17/2011 0249   MCHC 33.3 06/26/2014 1133   MCHC 33.6 11/17/2011 0249   MCHC 34.0 12/31/2008 0000   RDW 13.8 06/26/2014 1133   RDW 14.0 11/17/2011 0249   RDW 13.0  12/31/2008 0000   LYMPHSABS 2.9 06/26/2014 1133   LYMPHSABS 1.2 11/17/2011 0249   MONOABS 0.7 11/17/2011 0249   MONOABS 0.4 12/31/2008 0000   EOSABS 0.3 06/26/2014 1133   EOSABS 0.2 11/17/2011 0249   EOSABS 0.3 12/31/2008 0000   BASOSABS 0.1 06/26/2014 1133   BASOSABS 0.0 11/17/2011 0249   BASOSABS 0.1 12/31/2008 0000   Review of records upper endoscopy performed 11/13/2011 by Dr. Candace Cruise in [place]. This was performed for abdominal pain in the right upper quadrant, nausea with vomiting. Exam reported as normal.  ASSESSMENT/PLAN: 59 year old female with CFIDs and narcolepsy, migraines, IBS with chronic constipation, rectal and bladder prolapse who is seen in consultation at the request of Dr. Glori Bickers to consider colonoscopy and also with history of dysphagia.  1. Colon cancer screening -- I recommended screening colonoscopy. She is never had one and is due at this time. We discussed the tests including the risks benefits and alternatives and she is agreeable to proceed.  2. Rectocele/bladder prolapse -- ongoing evaluation with urology for her bladder issues. Follow-up imaging. Colonoscopy to rule out other anatomic abnormality  3. Dysphagia -- intermittent and liquid and pill dysphagia more than solid food. May be dysmotility. Upper endoscopy recommended to rule out esophagitis or structural lesion. We discussed the risks, benefits and alternatives and she is agreeable to proceed. Dilation  was also discussed should stricture be found and she is agreeable.  4. Abdominal bloating -- likely IBS related cannot exclude bacterial overgrowth. Proceed with upper endoscopy and colonoscopy before making further treatment decisions    Cc:Abner Greenspan, Md [address] [place], [place] [address]

## 2015-07-12 ENCOUNTER — Telehealth: Payer: Self-pay | Admitting: Internal Medicine

## 2015-07-12 NOTE — Telephone Encounter (Signed)
Pt states she is not having problems swallowing and wants to cancel the EGD and keep the colon as scheduled. Dr. Hilarie Fredrickson aware and egd cancelled.

## 2015-08-13 ENCOUNTER — Ambulatory Visit (AMBULATORY_SURGERY_CENTER): Payer: 59 | Admitting: Internal Medicine

## 2015-08-13 ENCOUNTER — Encounter: Payer: Self-pay | Admitting: Internal Medicine

## 2015-08-13 VITALS — BP 106/65 | HR 87 | Temp 98.8°F | Resp 16 | Ht 65.0 in | Wt 175.0 lb

## 2015-08-13 DIAGNOSIS — Z1210 Encounter for screening for malignant neoplasm of intestinal tract, unspecified: Secondary | ICD-10-CM

## 2015-08-13 DIAGNOSIS — D122 Benign neoplasm of ascending colon: Secondary | ICD-10-CM

## 2015-08-13 MED ORDER — SODIUM CHLORIDE 0.9 % IV SOLN: 500.0000 mL | INTRAVENOUS | Status: DC

## 2015-08-13 NOTE — Progress Notes (Signed)
Called to room to assist during endoscopic procedure.  Patient ID and intended procedure confirmed with present staff. Received instructions for my participation in the procedure from the performing physician.  

## 2015-08-13 NOTE — Progress Notes (Signed)
To recoverey, report to Doyle Askew, Therapist, sports, VSS/

## 2015-08-13 NOTE — Patient Instructions (Addendum)
YOU HAD AN ENDOSCOPIC PROCEDURE TODAY AT [facility]:   Refer to the procedure report that was given to you for any specific questions about what was found during the examination.  If the procedure report does not answer your questions, please call your gastroenterologist to clarify.  If you requested that your care partner not be given the details of your procedure findings, then the procedure report has been included in a sealed envelope for you to review at your convenience later.  YOU SHOULD EXPECT: Some feelings of bloating in the abdomen. Passage of more gas than usual.  Walking can help get rid of the air that was put into your GI tract during the procedure and reduce the bloating. If you had a lower endoscopy (such as a colonoscopy or flexible sigmoidoscopy) you may notice spotting of blood in your stool or on the toilet paper. If you underwent a bowel prep for your procedure, you may not have a normal bowel movement for a few days.  Please Note:  You might notice some irritation and congestion in your nose or some drainage.  This is from the oxygen used during your procedure.  There is no need for concern and it should clear up in a day or so.  SYMPTOMS TO REPORT IMMEDIATELY:   Following lower endoscopy (colonoscopy or flexible sigmoidoscopy):  Excessive amounts of blood in the stool  Significant tenderness or worsening of abdominal pains  Swelling of the abdomen that is new, acute  Fever of 100F or higher    For urgent or emergent issues, a gastroenterologist can be reached at any hour by calling [phone].   DIET: Your first meal following the procedure should be a small meal and then it is ok to progress to your normal diet. Heavy or fried foods are harder to digest and may make you feel nauseous or bloated.  Likewise, meals heavy in dairy and vegetables can increase bloating.  Drink plenty of fluids but you should avoid alcoholic beverages for 24  hours.  ACTIVITY:  You should plan to take it easy for the rest of today and you should NOT DRIVE or use heavy machinery until tomorrow (because of the sedation medicines used during the test).    Await pathology results. Hold all NSAID'S (aspirin, aspirin products, and anti-inflammatory drugs) for 2 weeks.   FOLLOW UP: Our staff will call the number listed on your records the next business day following your procedure to check on you and address any questions or concerns that you may have regarding the information given to you following your procedure. If we do not reach you, we will leave a message.  However, if you are feeling well and you are not experiencing any problems, there is no need to return our call.  We will assume that you have returned to your regular daily activities without incident.  If any biopsies were taken you will be contacted by phone or by letter within the next 1-3 weeks.  Please call us at [phone] if you have not heard about the biopsies in 3 weeks.    SIGNATURES/CONFIDENTIALITY: You and/or your care partner have signed paperwork which will be entered into your electronic medical record.  These signatures attest to the fact that that the information above on your After Visit Summary has been reviewed and is understood.  Full responsibility of the confidentiality of this discharge information lies with you and/or your care-partner.  Please review polyp handout provided. Await  pathology results.

## 2015-08-13 NOTE — Op Note (Signed)
[place]  [address] [place], [address]   COLONOSCOPY PROCEDURE REPORT  PATIENT: [name] Branch, [name] Branch  MR#: [id number] BIRTHDATE: 1956-05-12 , 59  yrs. old GENDER: female ENDOSCOPIST: Jerene Bears, MD REFERRED [id number] Vernell Morgans, M.D. PROCEDURE DATE:  08/13/2015 PROCEDURE:   Colonoscopy, screening, Colonoscopy with snare polypectomy, and Submucosal injection, any substance First Screening Colonoscopy - Avg.  risk and is 50 yrs.  old or older Yes.  Prior Negative Screening - Now for repeat screening. N/A  History of Adenoma - Now for follow-up colonoscopy & has been > or = to 3 yrs.  N/A  Polyps removed today? Yes ASA CLASS:   Class II INDICATIONS:Screening for colonic neoplasia, Colorectal Neoplasm Risk Assessment for this procedure is average risk, and 1st colonoscopy. MEDICATIONS: Monitored anesthesia care and Propofol 570 mg IV  DESCRIPTION OF PROCEDURE:   After the risks benefits and alternatives of the procedure were thoroughly explained, informed consent was obtained.  The digital rectal exam revealed no rectal mass.   The LB [id number]  endoscope was introduced through the anus and advanced to the cecum, which was identified by both the appendix and ileocecal valve. No adverse events experienced. The quality of the prep was fair.  (Suprep was used)  The instrument was then slowly withdrawn as the colon was fully examined. Estimated blood loss is zero unless otherwise noted in this procedure report.  COLON FINDINGS: A laterally-spreading sessile polyp measuring 30 mm in size with a mucous cap was found in the ascending colon.  A polypectomy was performed in a piecemeal fashion using snare cautery.  The resection was incomplete, the remaining polyp tissue was removed with cold forceps and sent to histology.  A tattoo was applied on the proximal and distal edge of the resection site. The colon was quite redundant throughout its course.   Manual abdominal counter-pressure was used to reach the cecum. Retroflexed views revealed internal hemorrhoids. The time to cecum = 14.6 Withdrawal time = 28.2   The scope was withdrawn and the procedure completed. COMPLICATIONS: There were no immediate complications.  ENDOSCOPIC IMPRESSION: 1.   Sessile polyp was found in the ascending colon; polypectomy was performed in a piecemeal fashion using snare cautery; a tattoo was applied 2.   The colon was redundant  RECOMMENDATIONS: 1.  Hold Aspirin and all other NSAIDs for 2 weeks. 2.  Await pathology results 3.  Anticipate repeat colonoscopy in 3-6 months to ensure complete resection.  Would need 1 hour slot with 2 day preparation 4.  You will receive a letter within 1-2 weeks with the results of your biopsy as well as final recommendations.  Please call my office if you have not received a letter after 3 weeks.  eSigned:  Jerene Bears, MD 08/13/2015 4:22 PM cc: The Patient and Abner Greenspan, MD

## 2015-08-14 ENCOUNTER — Telehealth: Payer: Self-pay | Admitting: *Deleted

## 2015-08-14 NOTE — Telephone Encounter (Signed)
  Follow up Call-  Call back number 08/13/2015  Post procedure Call Back phone  # [phone]  Permission to leave phone message Yes     Patient questions:  Do you have a fever, pain , or abdominal swelling? No. Pain Score  0 *  Have you tolerated food without any problems? Yes.    Have you been able to return to your normal activities? Yes.    Do you have any questions about your discharge instructions: Diet   No. Medications  No. Follow up visit  No.  Do you have questions or concerns about your Care? No.  Actions: * If pain score is 4 or above: No action needed, pain <4.

## 2015-08-20 ENCOUNTER — Encounter: Payer: Self-pay | Admitting: Internal Medicine

## 2015-10-02 ENCOUNTER — Telehealth: Payer: Self-pay | Admitting: Family Medicine

## 2015-10-02 NOTE — Telephone Encounter (Signed)
Pt advise she is up to date on tdap (done on9/1/15), pt also advise if injury starts to look infected, redness, hot, painful, or any other sxs of infection to schedule an appt ASAP, pt verbalized understanding

## 2015-10-02 NOTE — Telephone Encounter (Signed)
Pt stepped on a rusty nail and is wanting to know if she is up to date on her tetanus vaccine.  Please call [phone]  Thank you

## 2015-11-08 ENCOUNTER — Ambulatory Visit (INDEPENDENT_AMBULATORY_CARE_PROVIDER_SITE_OTHER): Payer: 59 | Admitting: Family Medicine

## 2015-11-08 ENCOUNTER — Encounter: Payer: Self-pay | Admitting: Family Medicine

## 2015-11-08 ENCOUNTER — Telehealth: Payer: Self-pay

## 2015-11-08 VITALS — BP 136/78 | HR 106 | Temp 98.3°F | Wt 194.8 lb

## 2015-11-08 DIAGNOSIS — R509 Fever, unspecified: Secondary | ICD-10-CM

## 2015-11-08 DIAGNOSIS — N3001 Acute cystitis with hematuria: Secondary | ICD-10-CM | POA: Diagnosis not present

## 2015-11-08 DIAGNOSIS — R05 Cough: Secondary | ICD-10-CM | POA: Diagnosis not present

## 2015-11-08 DIAGNOSIS — N390 Urinary tract infection, site not specified: Secondary | ICD-10-CM | POA: Insufficient documentation

## 2015-11-08 DIAGNOSIS — R058 Other specified cough: Secondary | ICD-10-CM | POA: Insufficient documentation

## 2015-11-08 LAB — POC URINALSYSI DIPSTICK (AUTOMATED)
BILIRUBIN UA: NEGATIVE
GLUCOSE UA: NEGATIVE
KETONES UA: NEGATIVE
Leukocytes, UA: NEGATIVE
NITRITE UA: 0.2
PH UA: 7.5
Protein, UA: NEGATIVE
Spec Grav, UA: 1.015
Urobilinogen, UA: NEGATIVE

## 2015-11-08 MED ORDER — ONDANSETRON HCL 4 MG PO TABS: 4.0000 mg | ORAL_TABLET | Freq: Three times a day (TID) | ORAL | Status: DC | PRN

## 2015-11-08 MED ORDER — CEPHALEXIN 500 MG PO CAPS: 500.0000 mg | ORAL_CAPSULE | Freq: Two times a day (BID) | ORAL | Status: DC

## 2015-11-08 NOTE — Progress Notes (Signed)
Subjective:    Patient ID: [name] Branch, female    DOB: 1955/11/29, 60 y.o.   MRN: [id number]  HPI Here with malaise and fever   Temp yesterday 101.7 -that was the highest  Has shakes (? Rigos) even when fever was not high   One day her R leg was swollen - that went down   Has had a lot of utis - sees urologist - last one was tx there-macrodantin, then just finished cipro  Has a cystocele- thinks this causes problems  Has had a work up   Pain in R post chest  Cough-some production- yellow/green phlegm - this week   Absolute unrelenting nausea  No diarrhea   Had her colonoscopy - has to repeat to make sure it all came out  Umbilical hernia is bigger Wt is up 19 lb today  Feels bloated   ua is pos for blood today   Patient Active Problem List   Diagnosis Date Noted  . UTI (urinary tract infection) 11/08/2015  . Productive cough 11/08/2015  . Constipation 04/12/2015  . Cystocele 04/12/2015  . Routine general medical examination at a health care facility 06/26/2014  . Osteopenia 06/26/2014  . Hyperglycemia 06/26/2014  . Colon cancer screening 06/26/2014  . Dysuria 06/26/2014  . Intertrigo 07/12/2013  . Chronic fatigue syndrome 01/19/2013  . Elevated alkaline phosphatase level 09/19/2012  . Edema 09/16/2012  . FREQUENCY, URINARY 08/06/2010  . Diarrhea 04/14/2010  . CHOLELITHIASIS 01/02/2009  . CONSTIPATION, CHRONIC 07/30/2008  . Irritable bowel syndrome 07/30/2008  . PURE HYPERCHOLESTEROLEMIA 06/21/2008  . POSTMENOPAUSAL STATUS 06/21/2008  . VAGINITIS, ATROPHIC 06/21/2008  . Pain in limb 06/21/2008   Past Medical History  Diagnosis Date  . 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 Dr. Cristy Friedlander   . Allergic rhinitis   . Migraine   . Chronic gastritis   . Hyperlipidemia   . West Nile Virus infection 2003  . Lyme disease     "stari"  . IBS (irritable bowel syndrome)   . Vaginal prolapse   .  Rectal prolapse   . Umbilical hernia   . Narcolepsy    Past Surgical History  Procedure Laterality Date  . Tonsillectomy    . Partial hysterectomy      bleeding  . Ulnar nerve repair      sx times 5  . Laprascopy    . Breast biopsy  '04    neg; right  . 5th r toe fx  '09  . Upper gastrointestinal endoscopy  12/04/2004    gastritis  . Cholecystectomy  10/2011   Social History  Substance Use Topics  . Smoking status: Never Smoker   . Smokeless tobacco: Never Used     Comment: non smoker  . Alcohol Use: No   Family History  Problem Relation Age of Onset  . Bipolar disorder      some family  . Colon cancer Neg Hx   . Breast cancer Paternal Grandmother   . Diabetes Father   . Hypertension Father   . Stroke Father   . Hypertension Mother   . Cancer Mother     [name]  . Alcohol abuse Maternal Grandfather   . Ulcers Mother    Allergies  Allergen Reactions  . Sulfonamide Derivatives    Current Outpatient Prescriptions on File Prior to Visit  Medication Sig Dispense Refill  . albuterol (PROAIR HFA) 108 (90 BASE) MCG/ACT inhaler Inhale  into the lungs.    . Calcium-Magnesium-Vitamin D (CALCIUM MAGNESIUM PO) Take 1 tablet by mouth daily.    . Cholecalciferol (VITAMIN D3) 5000 UNITS TABS Take 1 tablet by mouth daily.      . Cyanocobalamin (VITAMIN B 12 PO) Take by mouth as needed.    Marland Kitchen DHEA 25 MG CAPS Take by mouth.    . FIBER SELECT GUMMIES PO Take 1-3 tablets by mouth daily.    . magnesium oxide (MAG-OX) 400 MG tablet Take by mouth.    . modafinil (PROVIGIL) 100 MG tablet Take by mouth. Take 25-50 mg prn    . morphine (MSIR) 30 MG tablet Take 30 mg by mouth every 3 (three) hours as needed. UAD PRN    . Multiple Vitamin (MULTIVITAMIN) tablet Take 1 tablet by mouth daily.      . NON FORMULARY energems    . orphenadrine (NORFLEX) 100 MG tablet Take by mouth.    . promethazine (PHENERGAN) 25 MG tablet Take 1 tablet (25 mg total) by mouth every 6 (six) hours as needed for  nausea. 30 tablet 0  . Pseudoephedrine-APAP 30-325 MG TABS Take by mouth.    . simethicone (MYLICON) 80 MG chewable tablet Chew by mouth.    . SPIRULINA PO Take by mouth.    . Ubiquinol 100 MG CAPS Take by mouth.     No current facility-administered medications on file prior to visit.    Review of Systems Review of Systems  Constitutional: Negative for fever, appetite change,  and unexpected weight change. pos for chronic fatigue Eyes: Negative for pain and visual disturbance.  Respiratory: Negative for wheeze  and shortness of breath.  pos for chest wall pain Cardiovascular: Negative for cp or palpitations    Gastrointestinal: Negative for , diarrhea and constipation. pos for chronic nausea (is seeing GI) Genitourinary: pos for urgency and frequency and cystocele/ with occ dysuria  Skin: Negative for pallor or rash   Neurological: Negative for weakness, light-headedness, numbness and headaches.  MSK pos for chronic myofasical pain  Hematological: Negative for adenopathy. Does not bruise/bleed easily.  Psychiatric/Behavioral: Negative for dysphoric mood. The patient is sometimes nervous/anxious.         Objective:   Physical Exam  Constitutional: She appears well-developed and well-nourished. No distress.  obese and well appearing (seems fatigued however)  HENT:  Head: Normocephalic and atraumatic.  Right Ear: External ear normal.  Left Ear: External ear normal.  Nose: Nose normal.  Mouth/Throat: Oropharynx is clear and moist.  Nares are boggy  Eyes: Conjunctivae and EOM are normal. Pupils are equal, round, and reactive to light. Right eye exhibits no discharge. Left eye exhibits no discharge. No scleral icterus.  Neck: Normal range of motion. Neck supple. No JVD present. Carotid bruit is not present. No thyromegaly present.  Cardiovascular: Normal rate, regular rhythm, normal heart sounds and intact distal pulses.  Exam reveals no gallop.   Pulmonary/Chest: Effort normal and  breath sounds normal. No respiratory distress. She has no wheezes. She has no rales. She exhibits tenderness.  No crackles  Lateral chest wall tenderness without crepitus or skin change   Abdominal: Soft. Bowel sounds are normal. She exhibits no distension, no abdominal bruit and no mass. There is tenderness. There is no rebound and no guarding.  No cva tenderness  Mild suprapubic tenderness  Musculoskeletal: She exhibits no edema or tenderness.  No leg tenderness  Neg homann's sign bilat    Lymphadenopathy:    She has no  cervical adenopathy.  Neurological: She is alert. She has normal reflexes. No cranial nerve deficit. She exhibits normal muscle tone. Coordination normal.  Skin: Skin is warm and dry. No rash noted.  Psychiatric: She has a normal mood and affect.          Assessment & Plan:   Problem List Items Addressed This Visit      Genitourinary   UTI (urinary tract infection)    Pt has had intermittent/chronic utis  Cover with keflex and urine sent for culture Enc fluids  urol f/u pending  Adv to update if symptoms worsen      Relevant Medications   cephALEXin (KEFLEX) 500 MG capsule     Other   Productive cough    Ongoing- intermittent with recent fever (per pt this happens frequently) Unsure if related to that or uti  Covering with keflex for uti Reassuring exam Watch for sob or other new symptoms Suspect cwp is from coughing -suggest warm compress Will update if changes        Other Visit Diagnoses    Fever, unspecified fever cause    -  Primary    Relevant Orders    POCT Urinalysis Dipstick (Automated) (Completed)    Urine culture (Completed)

## 2015-11-08 NOTE — Progress Notes (Signed)
Pre visit review using our clinic review tool, if applicable. No additional management support is needed unless otherwise documented below in the visit note. 

## 2015-11-08 NOTE — Telephone Encounter (Signed)
Aware-thanks for letting me know  

## 2015-11-08 NOTE — Telephone Encounter (Signed)
Left v/m requesting pt to cb. Spoke with Dr Glori Bickers and she will open her appt today at 4:15 to see pt. Will let pt know when she returns call.

## 2015-11-08 NOTE — Telephone Encounter (Signed)
Pt called back and advised of appt today at 4:15 with Dr Glori Bickers. pts dog is having surgery now and pt will cb if cannot make appt. Pt voiced understanding.

## 2015-11-08 NOTE — Patient Instructions (Signed)
Take keflex as directed for urine  Drink fluids  mucinex over the counter can help congestion as needed Urine culture pending- will update you when it returns   Follow up with urology as planned

## 2015-11-08 NOTE — Telephone Encounter (Signed)
PLEASE NOTE: All timestamps contained within this report are represented as [place] Standard Time. CONFIDENTIALTY NOTICE: This fax transmission is intended only for the addressee. It contains information that is legally privileged, confidential or otherwise protected from use or disclosure. If you are not the intended recipient, you are strictly prohibited from reviewing, disclosing, copying using or disseminating any of this information or taking any action in reliance on or regarding this information. If you have received this fax in error, please notify us immediately by telephone so that we can arrange for its return to [place]. Phone: [phone], Toll-Free: [phone], Fax: [phone] Page: 1 of 2 Call Id: [id number] [place] Patient Name: [name] Branch Gender: Female DOB: [birth year] Age: 60 Y 90 M Return Phone Number: [phone] (Primary) Address: City/State/Zip: [place] Client [place] Night - Client Client Site [facility], [place] Contact Type Call Call Type Triage / Clinical Caller Name Dean Relationship To Patient Self Return Phone Number [phone] (Primary) Chief Complaint Pain - Generalized Initial Comment Caller states I left a message this morning and no one called me back been sick for several weeks may have kidney or lungs the pain is in that area having a fever and shakes PreDisposition Did not know what to do Nurse Assessment Nurse: Donovan Kail, RN, Barnetta Chapel Date/Time (Eastern Time): 11/07/2015 5:15:54 PM Confirm and document reason for call. If symptomatic, describe symptoms. ---Caller states I left a message this morning and no one called me back been sick for several weeks may have kidney or lungs the pain is in that area having a fever and shakes. Fever 101.7. Pain in chest. It hurts when she  moves. Fever. Has the patient traveled out of the country within the last 30 days? ---Not Applicable Does the patient have any new or worsening symptoms? ---Yes Will a triage be completed? ---Yes Related visit to physician within the last 2 weeks? ---No Does the PT have any chronic conditions? (i.e. diabetes, asthma, etc.) ---Yes List chronic conditions. 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 Is this a behavioral health or substance abuse call? ---No Guidelines Guideline Title Affirmed Question Affirmed Notes Nurse Date/Time (Eastern Time) Fever Fever present > 3 days (72 hours) Donovan Kail, RN, Barnetta Chapel 11/07/2015 5:21:26 PM Disp. Time Eilene Ghazi Time) Disposition Final User 11/07/2015 5:24:56 PM See Physician within 24 Hours Yes Donovan Kail, RN, Barnetta Chapel PLEASE NOTE: All timestamps contained within this report are represented as [place] Standard Time. CONFIDENTIALTY NOTICE: This fax transmission is intended only for the addressee. It contains information that is legally privileged, confidential or otherwise protected from use or disclosure. If you are not the intended recipient, you are strictly prohibited from reviewing, disclosing, copying using or disseminating any of this information or taking any action in reliance on or regarding this information. If you have received this fax in error, please notify us immediately by telephone so that we can arrange for its return to [place]. Phone: [phone], Toll-Free: [phone], Fax: [phone] Page: 2 of 2 Call Id: [id number] Caller Understands: Yes Disagree/Comply: Comply Care Advice Given Per Guideline SEE PHYSICIAN WITHIN 24 HOURS: * IF OFFICE WILL BE OPEN: You need to be seen within the next 24 hours. Call your doctor when the office opens, and make an appointment. FOR ALL FEVERS: * Drink cold fluids to prevent dehydration. * Dress in 1 layer of lightweight clothing and sleep with 1 light blanket. * For  fevers less than 101 F (38.3 C), fever  medicines are usually not necessary. * For fever relief, take acetaminophen or ibuprofen. FEVER MEDICINES: * Treat fevers above 101 F (38.3 C). CALL BACK IF: * You become worse. CARE ADVICE given per Fever (Adult) guideline. After Care Instructions Given Call Event Type User Date / Time Description Education document email Idamae Lusher 11/07/2015 5:26:30 PM [facility] Instructions Referrals REFERRED TO PCP OFFICE

## 2015-11-09 LAB — URINE CULTURE: Colony Count: 65000

## 2015-11-09 NOTE — Assessment & Plan Note (Signed)
Ongoing- intermittent with recent fever (per pt this happens frequently) Unsure if related to that or uti  Covering with keflex for uti Reassuring exam Watch for sob or other new symptoms Suspect cwp is from coughing -suggest warm compress Will update if changes

## 2015-11-09 NOTE — Assessment & Plan Note (Signed)
Pt has had intermittent/chronic utis  Cover with keflex and urine sent for culture Enc fluids  urol f/u pending  Adv to update if symptoms worsen

## 2015-11-10 ENCOUNTER — Telehealth: Payer: Self-pay | Admitting: Family Medicine

## 2015-11-10 NOTE — Telephone Encounter (Signed)
Urine grew group B strep - this sometimes causes urinary symptoms and we usually treat it  Please continue keflex-this should cover it  Copy of ua and cx to her urologist please Update if no improvement

## 2015-11-11 ENCOUNTER — Telehealth: Payer: Self-pay

## 2015-11-11 NOTE — Telephone Encounter (Signed)
Patient advised. Appointment scheduled.  

## 2015-11-11 NOTE — Telephone Encounter (Signed)
Please schedule her for a f/u - I may want to get a cxr  Thanks

## 2015-11-11 NOTE — Telephone Encounter (Signed)
Pt notified of Dr. Marliss Coots comments. Pt said that she is still coughing and the chest congestion feels a little worse from when she was here, pt said she is still having urinary frequency and when she does go to the bathroom she is voiding a lot not just a little bit. Pt said she does have a little fever but not high the highest it has been was 100.3. Pt is surprised that the urine cx showed that the keflex will take care of UTI because she just finished a course of abx about a week ago,  Pt said her main new sxs since she saw you is very bad shaking and severe nausea

## 2015-11-11 NOTE — Telephone Encounter (Signed)
Pt notified of Dr. Marliss Coots comments (see new phone note)

## 2015-11-11 NOTE — Telephone Encounter (Signed)
I am not sure  Is she still coughing? Is chest congestion worse?  How about urinary symptoms ? Does she have a fever or just the shaking?

## 2015-11-11 NOTE — Telephone Encounter (Signed)
Pt left v/m; pt was seen 11/08/15 and given the same abx she was previously taking for UTI; today pt cannot stop shaking and feels nauseated. Pt wants to know if Dr Glori Bickers thinks this is a continuation of problem pt was seen for on 11/08/15 or is this separate condition. Pt request cb.

## 2015-11-12 ENCOUNTER — Encounter: Payer: Self-pay | Admitting: Family Medicine

## 2015-11-12 ENCOUNTER — Ambulatory Visit (INDEPENDENT_AMBULATORY_CARE_PROVIDER_SITE_OTHER)
Admission: RE | Admit: 2015-11-12 | Discharge: 2015-11-12 | Disposition: A | Discharge status: Home / Self Care | Payer: 59 | Source: Ambulatory Visit | Attending: Family Medicine | Admitting: Family Medicine

## 2015-11-12 ENCOUNTER — Ambulatory Visit (INDEPENDENT_AMBULATORY_CARE_PROVIDER_SITE_OTHER): Payer: 59 | Admitting: Family Medicine

## 2015-11-12 VITALS — BP 126/86 | HR 107 | Temp 99.1°F | Ht 65.0 in | Wt 195.5 lb

## 2015-11-12 DIAGNOSIS — R110 Nausea: Secondary | ICD-10-CM | POA: Insufficient documentation

## 2015-11-12 DIAGNOSIS — R509 Fever, unspecified: Secondary | ICD-10-CM | POA: Diagnosis not present

## 2015-11-12 DIAGNOSIS — R058 Other specified cough: Secondary | ICD-10-CM

## 2015-11-12 DIAGNOSIS — R05 Cough: Secondary | ICD-10-CM

## 2015-11-12 DIAGNOSIS — N3001 Acute cystitis with hematuria: Secondary | ICD-10-CM | POA: Diagnosis not present

## 2015-11-12 DIAGNOSIS — R112 Nausea with vomiting, unspecified: Secondary | ICD-10-CM | POA: Insufficient documentation

## 2015-11-12 DIAGNOSIS — R739 Hyperglycemia, unspecified: Secondary | ICD-10-CM

## 2015-11-12 DIAGNOSIS — R251 Tremor, unspecified: Secondary | ICD-10-CM | POA: Insufficient documentation

## 2015-11-12 NOTE — Telephone Encounter (Signed)
UA and culture sent to urologist

## 2015-11-12 NOTE — Patient Instructions (Signed)
Lab today  Chest xray today  uti should be treated- follow up with urology Keep drinking water Tylenol as needed for fever - keep checking temp when you think it is high

## 2015-11-12 NOTE — Progress Notes (Signed)
Pre visit review using our clinic review tool, if applicable. No additional management support is needed unless otherwise documented below in the visit note. 

## 2015-11-12 NOTE — Progress Notes (Signed)
Subjective:    Patient ID: [name] Branch, female    DOB: 1956/06/11, 60 y.o.   MRN: [id number]  HPI  Here with ongoing nausea/tremors/urinary symptoms and cough  tx with keflex for presumed uti  ucx -GBS-tx with keflex Still has urinary frequency  Taking urinary analgesic Also pressure from prolapse  Has urol f/u 1/24   Tremor -ongoing -feels like rigors  Temp on and off 100.7  Some sweats  occ takes tylenol and it helps a little   MSIR-no change at all  occ takes pseudoephedrine -occ but not all the time Does not take provigil at all any more   Had some jerking - magnesium otc helps   Nausea is ongoing - long time This is more than usual  Taking zofran-otherwise would be vomiting  Has 2nd colonoscopy planned   Cough - slight improvement  Still feels congested  No wheeze except if she laughs hard  Phlegm is brown to grey / occ grey/ very seldom tiny tinge of blood   Patient Active Problem List   Diagnosis Date Noted  . Fever, unspecified 11/12/2015  . Tremor 11/12/2015  . Chronic nausea 11/12/2015  . UTI (urinary tract infection) 11/08/2015  . Productive cough 11/08/2015  . Constipation 04/12/2015  . Cystocele 04/12/2015  . Routine general medical examination at a health care facility 06/26/2014  . Osteopenia 06/26/2014  . Hyperglycemia 06/26/2014  . Colon cancer screening 06/26/2014  . Dysuria 06/26/2014  . Intertrigo 07/12/2013  . Chronic fatigue syndrome 01/19/2013  . Elevated alkaline phosphatase level 09/19/2012  . Edema 09/16/2012  . FREQUENCY, URINARY 08/06/2010  . Diarrhea 04/14/2010  . CHOLELITHIASIS 01/02/2009  . CONSTIPATION, CHRONIC 07/30/2008  . Irritable bowel syndrome 07/30/2008  . PURE HYPERCHOLESTEROLEMIA 06/21/2008  . POSTMENOPAUSAL STATUS 06/21/2008  . VAGINITIS, ATROPHIC 06/21/2008  . Pain in limb 06/21/2008   Past Medical History  Diagnosis Date  . Chronic fatigue syndrome     CFIDs; also chronic joint and mucle pain  (possible tik bourne illness?). Dr. Cristy Friedlander   . Allergic rhinitis   . Migraine   . Chronic gastritis   . Hyperlipidemia   . West Nile Virus infection 2003  . Lyme disease     "stari"  . IBS (irritable bowel syndrome)   . Vaginal prolapse   . Rectal prolapse   . Umbilical hernia   . Narcolepsy    Past Surgical History  Procedure Laterality Date  . Tonsillectomy    . Partial hysterectomy      bleeding  . Ulnar nerve repair      sx times 5  . Laprascopy    . Breast biopsy  '04    neg; right  . 5th r toe fx  '09  . Upper gastrointestinal endoscopy  12/04/2004    gastritis  . Cholecystectomy  10/2011   Social History  Substance Use Topics  . Smoking status: Never Smoker   . Smokeless tobacco: Never Used     Comment: non smoker  . Alcohol Use: No   Family History  Problem Relation Age of Onset  . Bipolar disorder      some family  . Colon cancer Neg Hx   . Breast cancer Paternal Grandmother   . Diabetes Father   . Hypertension Father   . Stroke Father   . Hypertension Mother   . Cancer Mother     [name]  . Alcohol abuse Maternal Grandfather   . Ulcers Mother    Allergies  Allergen  Reactions  . Sulfonamide Derivatives    Current Outpatient Prescriptions on File Prior to Visit  Medication Sig Dispense Refill  . albuterol (PROAIR HFA) 108 (90 BASE) MCG/ACT inhaler Inhale into the lungs.    . Calcium-Magnesium-Vitamin D (CALCIUM MAGNESIUM PO) Take 1 tablet by mouth daily.    . cephALEXin (KEFLEX) 500 MG capsule Take 1 capsule (500 mg total) by mouth 2 (two) times daily. 14 capsule 0  . Cholecalciferol (VITAMIN D3) 5000 UNITS TABS Take 1 tablet by mouth daily.      . Cyanocobalamin (VITAMIN B 12 PO) Take by mouth as needed.    Marland Kitchen DHEA 25 MG CAPS Take by mouth.    . FIBER SELECT GUMMIES PO Take 1-3 tablets by mouth daily.    . magnesium oxide (MAG-OX) 400 MG tablet Take by mouth.    . modafinil (PROVIGIL) 100 MG tablet Take by mouth. Take 25-50 mg prn    .  morphine (MSIR) 30 MG tablet Take 30 mg by mouth every 3 (three) hours as needed. UAD PRN    . Multiple Vitamin (MULTIVITAMIN) tablet Take 1 tablet by mouth daily.      . NON FORMULARY energems    . ondansetron (ZOFRAN) 4 MG tablet Take 1 tablet (4 mg total) by mouth every 8 (eight) hours as needed for nausea or vomiting. 30 tablet 0  . orphenadrine (NORFLEX) 100 MG tablet Take by mouth.    . promethazine (PHENERGAN) 25 MG tablet Take 1 tablet (25 mg total) by mouth every 6 (six) hours as needed for nausea. 30 tablet 0  . Pseudoephedrine-APAP 30-325 MG TABS Take by mouth.    . simethicone (MYLICON) 80 MG chewable tablet Chew by mouth.    . SPIRULINA PO Take by mouth.    . Ubiquinol 100 MG CAPS Take by mouth.     No current facility-administered medications on file prior to visit.     Review of Systems Review of Systems  Constitutional: Negative for fever, appetite change, and unexpected weight change.  ENT pos for pnd / neg for ST Eyes: Negative for pain and visual disturbance.  Respiratory: Negative for wheeze  and shortness of breath.   Cardiovascular: Negative for cp or palpitations    Gastrointestinal: Negative for , diarrhea and constipation. neg for blood in stool  Genitourinary: pos for urgency and frequency.  Skin: Negative for pallor or rash   Neurological: Negative for weakness, light-headedness, numbness and pos for headaches.  Hematological: Negative for adenopathy. Does not bruise/bleed easily.  Psychiatric/Behavioral: Negative for dysphoric mood. The patient is not nervous/anxious.         Objective:   Physical Exam  Constitutional: She appears well-developed and well-nourished. No distress.  Obese and fatigued appearing  Generally weak-walks with a cane   HENT:  Head: Normocephalic and atraumatic.  Mouth/Throat: Oropharynx is clear and moist.  Eyes: Conjunctivae and EOM are normal. Pupils are equal, round, and reactive to light.  Neck: Normal range of motion.  Neck supple. No JVD present. Carotid bruit is not present. No thyromegaly present.  Cardiovascular: Normal rate, regular rhythm, normal heart sounds and intact distal pulses.  Exam reveals no gallop.   Pulmonary/Chest: Effort normal and breath sounds normal. No respiratory distress. She has no wheezes. She has no rales. She exhibits no tenderness.  No crackles  Abdominal: Soft. Bowel sounds are normal. She exhibits no distension, no pulsatile liver, no abdominal bruit and no mass. There is no hepatosplenomegaly. There is tenderness in the  suprapubic area. There is no CVA tenderness.  Musculoskeletal: She exhibits no edema.  Lymphadenopathy:    She has no cervical adenopathy.  Neurological: She is alert. She has normal reflexes. No cranial nerve deficit. She exhibits normal muscle tone. Coordination normal.  No tremor today  Skin: Skin is warm and dry. No rash noted.  Psychiatric: She has a normal mood and affect.          Assessment & Plan:   Problem List Items Addressed This Visit      Genitourinary   UTI (urinary tract infection) - Primary    Last tx with keflex /has GBS on cx  Still nauseated and has urinary symptoms  She thinks the cystocele is problematic  Will return to her urologist         Other   Chronic nausea    Lab today ? If rel to frequent abx/ freq uti or other  F/u GI      Relevant Orders   CBC with Differential/Platelet (Completed)   Vitamin B12 (Completed)   Fever, unspecified    Lab and cxr today  Will f/u with urol for urinary symptoms - just finished abx        Relevant Orders   CBC with Differential/Platelet (Completed)   Comprehensive metabolic panel (Completed)   Hyperglycemia    A1C with lab today  Diet is the same Wt gain noted       Relevant Orders   Hemoglobin A1c (Completed)   Productive cough    With nausea/malaise and intermittent low grade temp  cxr today      Relevant Orders   CBC with Differential/Platelet (Completed)     DG Chest 2 View (Completed)   Tremor    Intermittent ? Rigors from low grade temp  Lab today      Relevant Orders   TSH (Completed)

## 2015-11-13 ENCOUNTER — Telehealth: Payer: Self-pay | Admitting: Family Medicine

## 2015-11-13 LAB — CBC WITH DIFFERENTIAL/PLATELET
Basophils Absolute: 0 10*3/uL (ref 0.0–0.2)
Basos: 1 %
EOS (ABSOLUTE): 0.3 10*3/uL (ref 0.0–0.4)
EOS: 4 %
HEMOGLOBIN: 13.4 g/dL (ref 11.1–15.9)
Hematocrit: 40.8 % (ref 34.0–46.6)
Immature Grans (Abs): 0 10*3/uL (ref 0.0–0.1)
Immature Granulocytes: 0 %
LYMPHS ABS: 2.4 10*3/uL (ref 0.7–3.1)
Lymphs: 39 %
MCH: 27.2 pg (ref 26.6–33.0)
MCHC: 32.8 g/dL (ref 31.5–35.7)
MCV: 83 fL (ref 79–97)
MONOCYTES: 10 %
Monocytes Absolute: 0.6 10*3/uL (ref 0.1–0.9)
NEUTROS ABS: 3 10*3/uL (ref 1.4–7.0)
Neutrophils: 46 %
PLATELETS: 236 10*3/uL (ref 150–379)
RBC: 4.93 x10E6/uL (ref 3.77–5.28)
RDW: 14.1 % (ref 12.3–15.4)
WBC: 6.3 10*3/uL (ref 3.4–10.8)

## 2015-11-13 LAB — COMPREHENSIVE METABOLIC PANEL
ALBUMIN: 4.4 g/dL (ref 3.5–5.5)
ALK PHOS: 123 IU/L — AB (ref 39–117)
ALT: 31 IU/L (ref 0–32)
AST: 55 IU/L — AB (ref 0–40)
Albumin/Globulin Ratio: 1.1 (ref 1.1–2.5)
BUN/Creatinine Ratio: 12 (ref 9–23)
BUN: 8 mg/dL (ref 6–24)
Bilirubin Total: 0.6 mg/dL (ref 0.0–1.2)
CO2: 24 mmol/L (ref 18–29)
CREATININE: 0.69 mg/dL (ref 0.57–1.00)
Calcium: 9.5 mg/dL (ref 8.7–10.2)
Chloride: 98 mmol/L (ref 96–106)
GFR calc Af Amer: 110 mL/min/{1.73_m2} (ref >59)
GFR calc non Af Amer: 96 mL/min/{1.73_m2} (ref >59)
GLUCOSE: 90 mg/dL (ref 65–99)
Globulin, Total: 4 g/dL (ref 1.5–4.5)
Potassium: 4.5 mmol/L (ref 3.5–5.2)
Sodium: 141 mmol/L (ref 134–144)
Total Protein: 8.4 g/dL (ref 6.0–8.5)

## 2015-11-13 LAB — TSH: TSH: 2.08 u[IU]/mL (ref 0.450–4.500)

## 2015-11-13 LAB — HEMOGLOBIN A1C
ESTIMATED AVERAGE GLUCOSE: 126 mg/dL
HEMOGLOBIN A1C: 6 % — AB (ref 4.8–5.6)

## 2015-11-13 LAB — VITAMIN B12: Vitamin B-12: 665 pg/mL (ref 211–946)

## 2015-11-13 MED ORDER — AZITHROMYCIN 250 MG PO TABS: ORAL_TABLET | ORAL | Status: DC

## 2015-11-13 NOTE — Telephone Encounter (Signed)
There is a hazy area in L lung base on her cxr  Could be an early pneumonia (which can be viral or bacterial but I want to cover for)  Please call in zithromax and take as directed  Drink fluids and rest  Update if symptoms worsen  F/u in about a 6 weeks for a re check but update sooner if no improvement

## 2015-11-13 NOTE — Telephone Encounter (Signed)
Pt notified of xray results and Dr. Marliss Coots instructions, Rx sent to pharmacy and f/u appt scheduled

## 2015-11-14 ENCOUNTER — Encounter: Payer: Self-pay | Admitting: *Deleted

## 2015-11-14 NOTE — Assessment & Plan Note (Signed)
Lab today ? If rel to frequent abx/ freq uti or other  F/u GI

## 2015-11-14 NOTE — Assessment & Plan Note (Signed)
Intermittent ? Rigors from low grade temp  Lab today

## 2015-11-14 NOTE — Assessment & Plan Note (Signed)
A1C with lab today  Diet is the same Wt gain noted

## 2015-11-14 NOTE — Assessment & Plan Note (Signed)
Last tx with keflex /has GBS on cx  Still nauseated and has urinary symptoms  She thinks the cystocele is problematic  Will return to her urologist

## 2015-11-14 NOTE — Assessment & Plan Note (Signed)
Lab and cxr today  Will f/u with urol for urinary symptoms - just finished abx

## 2015-11-14 NOTE — Assessment & Plan Note (Signed)
With nausea/malaise and intermittent low grade temp  cxr today

## 2015-11-15 NOTE — Telephone Encounter (Signed)
Pt notified of Dr. Tower's comments  

## 2015-11-15 NOTE — Telephone Encounter (Signed)
Good question- go ahead and finish both abx

## 2015-11-15 NOTE — Telephone Encounter (Signed)
Pt left v/m; pt was not clear if zpak was in addition to the other abx pt is taking or in lieu of. Pt request cb.

## 2015-12-10 ENCOUNTER — Encounter: Payer: Self-pay | Admitting: Internal Medicine

## 2015-12-25 ENCOUNTER — Ambulatory Visit: Payer: 59 | Admitting: Family Medicine

## 2015-12-27 ENCOUNTER — Ambulatory Visit: Payer: 59 | Admitting: Family Medicine

## 2016-01-06 ENCOUNTER — Ambulatory Visit: Payer: 59 | Admitting: Family Medicine

## 2016-01-21 ENCOUNTER — Ambulatory Visit: Payer: 59 | Admitting: Family Medicine

## 2016-01-28 ENCOUNTER — Telehealth: Payer: Self-pay

## 2016-01-28 DIAGNOSIS — R0602 Shortness of breath: Secondary | ICD-10-CM

## 2016-01-28 NOTE — Telephone Encounter (Signed)
Pt left v/m; pt was seen 11/12/15 and pt said xray showed pneumonia; pt having trouble breathing (not able to get full breath) and wants to get f/u chest xray done at [facility] and then pt will come in to f/u appt with Dr Glori Bickers. Pt request cb.

## 2016-01-28 NOTE — Telephone Encounter (Signed)
We can do the CXR here- is there a reason she wants to get it at [facility] before she comes?

## 2016-01-29 DIAGNOSIS — R0600 Dyspnea, unspecified: Secondary | ICD-10-CM | POA: Insufficient documentation

## 2016-01-29 NOTE — Telephone Encounter (Signed)
Pt will come in for xray this week, please put xray order in

## 2016-01-29 NOTE — Telephone Encounter (Signed)
Please have her come in for an xray here at her convenience before the end of the week-thanks

## 2016-01-29 NOTE — Telephone Encounter (Signed)
Pt was just asking to go to [facility] because when she called to make an appt with Dr. Glori Bickers the front desk advised her it would be after you return from your vacation(around 4/17) before she could get an appt so she thought she wouldn't be able to get an xray done here until then. Pt really just wanted an xray so she is okay with having it done here and then following up with you once you return if that's okay, please advise

## 2016-01-29 NOTE — Telephone Encounter (Signed)
Order is done.

## 2016-01-30 ENCOUNTER — Ambulatory Visit (INDEPENDENT_AMBULATORY_CARE_PROVIDER_SITE_OTHER)
Admission: RE | Admit: 2016-01-30 | Discharge: 2016-01-30 | Disposition: A | Discharge status: Home / Self Care | Payer: 59 | Source: Ambulatory Visit | Attending: Family Medicine | Admitting: Family Medicine

## 2016-01-30 DIAGNOSIS — R0602 Shortness of breath: Secondary | ICD-10-CM | POA: Diagnosis not present

## 2016-02-05 ENCOUNTER — Telehealth: Payer: Self-pay | Admitting: Internal Medicine

## 2016-02-05 NOTE — Telephone Encounter (Signed)
Pt states that she has been having RUQ pain. States when she looks in the mirror there is a swollen area that feels firm. Pt concerned and wants to see Dr. Hilarie Fredrickson. Pt scheduled to see Dr. Hilarie Fredrickson 04/20/16@2pm . Pt aware of appt.

## 2016-02-17 ENCOUNTER — Telehealth: Payer: Self-pay | Admitting: Family Medicine

## 2016-02-17 NOTE — Telephone Encounter (Signed)
[facility] Patient Name: [name] Branch DOB: 1956-03-21 Initial Comment Caller states she wants to know if taking Azo will affect her urine culture tomorrow. Has bladder prolapse with pain with urination. Nurse Assessment Nurse: Verlin Fester RN, Stanton Kidney Date/Time Eilene Ghazi Time): 02/17/2016 11:19:16 AM Confirm and document reason for call. If symptomatic, describe symptoms. You must click the next button to save text entered. ---Patient states she is having a bladder prolapse and pain with utination Has the patient traveled out of the country within the last 30 days? ---No Does the patient have any new or worsening symptoms? ---Yes Will a triage be completed? ---No Select reason for no triage. ---Patient declined Please document clinical information provided and list any resource used. ---Patient refused triage, states she knows what this is from and it is really painful, states she is going to see a urologist tomorrow. Instructed her that AZO makes the urine orange and if she needs to take it for the pain then should be sure to tell them she is taking it. Nurse: Dimas Chyle, RN, Dellis Filbert Date/Time Eilene Ghazi Time): 02/17/2016 12:37:05 PM Confirm and document reason for call. If symptomatic, describe symptoms. You must click the next button to save text entered. ---Caller states she wants to know if taking Azo will affect her urine culture tomorrow. Has bladder prolapse with pain with urination. Has the patient traveled out of the country within the last 30 days? ---No Does the patient have any new or worsening symptoms? ---Yes Will a triage be completed? ---No Select reason for no triage. ---Patient declined Please document clinical information provided and list any resource used. ---When called back caller had already spoken with a nurse and all questions were answered. Guidelines Guideline Title  Affirmed Question Affirmed Notes Final Disposition User Clinical Call [place], RN, Dellis Filbert

## 2016-02-18 ENCOUNTER — Encounter: Payer: Self-pay | Admitting: Family Medicine

## 2016-02-18 ENCOUNTER — Ambulatory Visit (INDEPENDENT_AMBULATORY_CARE_PROVIDER_SITE_OTHER): Payer: 59 | Admitting: Family Medicine

## 2016-02-18 VITALS — BP 130/60 | HR 109 | Temp 98.6°F | Ht 65.0 in | Wt 201.0 lb

## 2016-02-18 DIAGNOSIS — R1011 Right upper quadrant pain: Secondary | ICD-10-CM | POA: Insufficient documentation

## 2016-02-18 DIAGNOSIS — N8110 Cystocele, unspecified: Secondary | ICD-10-CM | POA: Diagnosis not present

## 2016-02-18 DIAGNOSIS — R739 Hyperglycemia, unspecified: Secondary | ICD-10-CM

## 2016-02-18 DIAGNOSIS — N390 Urinary tract infection, site not specified: Secondary | ICD-10-CM | POA: Diagnosis not present

## 2016-02-18 DIAGNOSIS — IMO0002 Reserved for concepts with insufficient information to code with codable children: Secondary | ICD-10-CM

## 2016-02-18 DIAGNOSIS — R300 Dysuria: Secondary | ICD-10-CM

## 2016-02-18 LAB — POC URINALSYSI DIPSTICK (AUTOMATED)
Bilirubin, UA: NEGATIVE
Blood, UA: 80
GLUCOSE UA: NEGATIVE
Nitrite, UA: NEGATIVE
Protein, UA: 15
SPEC GRAV UA: 1.015
UROBILINOGEN UA: 0.2
pH, UA: 8

## 2016-02-18 LAB — COMPREHENSIVE METABOLIC PANEL
ALBUMIN: 4 g/dL (ref 3.5–5.2)
ALT: 33 U/L (ref 0–35)
AST: 57 U/L — ABNORMAL HIGH (ref 0–37)
Alkaline Phosphatase: 109 U/L (ref 39–117)
BUN: 8 mg/dL (ref 6–23)
CHLORIDE: 98 meq/L (ref 96–112)
CO2: 33 meq/L — AB (ref 19–32)
Calcium: 10.4 mg/dL (ref 8.4–10.5)
Creatinine, Ser: 0.62 mg/dL (ref 0.40–1.20)
GFR: 104.44 mL/min (ref >60.00)
GLUCOSE: 97 mg/dL (ref 70–99)
POTASSIUM: 4.6 meq/L (ref 3.5–5.1)
SODIUM: 136 meq/L (ref 135–145)
Total Bilirubin: 0.8 mg/dL (ref 0.2–1.2)
Total Protein: 8.1 g/dL (ref 6.0–8.3)

## 2016-02-18 NOTE — Progress Notes (Signed)
Subjective:    Patient ID: [name] Branch, female    DOB: 1956/07/03, 60 y.o.   MRN: [id number]  HPI Here for urinary and GI issues  Wt is up 6 lb  bmi of 33  UA Results for orders placed or performed in visit on 02/18/16  POCT Urinalysis Dipstick (Automated)  Result Value Ref Range   Color, UA Yellow    Clarity, UA cloudy    Glucose, UA Negative    Bilirubin, UA Negative    Ketones, UA Trace    Spec Grav, UA 1.015    Blood, UA 80 Ery/uL    pH, UA 8.0    Protein, UA 15 mg/dL    Urobilinogen, UA 0.2    Nitrite, UA Negative    Leukocytes, UA large (3+) (A) Negative    Has had GBS in the past on cx  Sees urology Dr Matilde Sprang -was planning to put her on an proph abx (was put on cephalexin last time)  Had disc macrodantin prophylaxis  ? May have cystocele repair Not currently on abx prophylaxis (hx of frequent uti and also cytocele)  Is hard to get a good clean catch  Unsure if uti is better in between treatment   Next appt is in May- but won't keep it until infections are clear   Urinary symptoms are frequent urination (? Due to overactive bladder) Discomfort from cystocele  No burning  occ a small amt of blood in urine-not unusual (thought due from a kidney stone)    Has GI appt in June with Dr Hilarie Fredrickson  Has some pain in RUQ  On and off - getting worse  Not worse with eating  Looks swollen to her  Feels distended in abdomen in general   Patient Active Problem List   Diagnosis Date Noted  . Frequent UTI 02/18/2016  . Abdominal pain, RUQ 02/18/2016  . Shortness of breath 01/29/2016  . Fever, unspecified 11/12/2015  . Tremor 11/12/2015  . Chronic nausea 11/12/2015  . UTI (urinary tract infection) 11/08/2015  . Productive cough 11/08/2015  . Constipation 04/12/2015  . Cystocele 04/12/2015  . Routine general medical examination at a health care facility 06/26/2014  . Osteopenia 06/26/2014  . Hyperglycemia 06/26/2014  . Colon cancer screening 06/26/2014  .  Dysuria 06/26/2014  . Intertrigo 07/12/2013  . Chronic fatigue syndrome 01/19/2013  . Elevated alkaline phosphatase level 09/19/2012  . Edema 09/16/2012  . FREQUENCY, URINARY 08/06/2010  . Diarrhea 04/14/2010  . CHOLELITHIASIS 01/02/2009  . CONSTIPATION, CHRONIC 07/30/2008  . Irritable bowel syndrome 07/30/2008  . PURE HYPERCHOLESTEROLEMIA 06/21/2008  . POSTMENOPAUSAL STATUS 06/21/2008  . VAGINITIS, ATROPHIC 06/21/2008  . Pain in limb 06/21/2008   Past Medical History  Diagnosis Date  . 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 Dr. Cristy Friedlander   . Allergic rhinitis   . Migraine   . Chronic gastritis   . Hyperlipidemia   . West Nile Virus infection 2003  . Lyme disease     "stari"  . IBS (irritable bowel syndrome)   . Vaginal prolapse   . Rectal prolapse   . Umbilical hernia   . Narcolepsy    Past Surgical History  Procedure Laterality Date  . Tonsillectomy    . Partial hysterectomy      bleeding  . Ulnar nerve repair      sx times 5  . Laprascopy    . Breast biopsy  '04  neg; right  . 5th r toe fx  '09  . Upper gastrointestinal endoscopy  12/04/2004    gastritis  . Cholecystectomy  10/2011   Social History  Substance Use Topics  . Smoking status: Never Smoker   . Smokeless tobacco: Never Used     Comment: non smoker  . Alcohol Use: No   Family History  Problem Relation Age of Onset  . Bipolar disorder      some family  . Colon cancer Neg Hx   . Breast cancer Paternal Grandmother   . Diabetes Father   . Hypertension Father   . Stroke Father   . Hypertension Mother   . Cancer Mother     [name]  . Alcohol abuse Maternal Grandfather   . Ulcers Mother    Allergies  Allergen Reactions  . Sulfonamide Derivatives    Current Outpatient Prescriptions on File Prior to Visit  Medication Sig Dispense Refill  . Calcium-Magnesium-Vitamin D (CALCIUM MAGNESIUM PO) Take 1 tablet by mouth daily.    .  Cholecalciferol (VITAMIN D3) 5000 UNITS TABS Take 1 tablet by mouth daily.      . Cyanocobalamin (VITAMIN B 12 PO) Take by mouth as needed.    Marland Kitchen DHEA 25 MG CAPS Take by mouth.    . FIBER SELECT GUMMIES PO Take 1-3 tablets by mouth daily.    . magnesium oxide (MAG-OX) 400 MG tablet Take by mouth.    . modafinil (PROVIGIL) 100 MG tablet Take by mouth. Take 25-50 mg prn    . morphine (MSIR) 30 MG tablet Take 30 mg by mouth every 3 (three) hours as needed. UAD PRN    . Multiple Vitamin (MULTIVITAMIN) tablet Take 1 tablet by mouth daily.      . NON FORMULARY energems    . ondansetron (ZOFRAN) 4 MG tablet Take 1 tablet (4 mg total) by mouth every 8 (eight) hours as needed for nausea or vomiting. 30 tablet 0  . orphenadrine (NORFLEX) 100 MG tablet Take by mouth.    . promethazine (PHENERGAN) 25 MG tablet Take 1 tablet (25 mg total) by mouth every 6 (six) hours as needed for nausea. 30 tablet 0  . Pseudoephedrine-APAP 30-325 MG TABS Take by mouth.    . simethicone (MYLICON) 80 MG chewable tablet Chew by mouth.    . SPIRULINA PO Take by mouth.    . Ubiquinol 100 MG CAPS Take by mouth.    Marland Kitchen albuterol (PROAIR HFA) 108 (90 BASE) MCG/ACT inhaler Inhale into the lungs.     No current facility-administered medications on file prior to visit.     Review of Systems Review of Systems  Constitutional: Negative for fever, appetite change,  and unexpected weight change.  Eyes: Negative for pain and visual disturbance.  Respiratory: Negative for cough and shortness of breath.   Cardiovascular: Negative for cp or palpitations    Gastrointestinal: Negative for nausea, diarrhea . pos for abd pain and bloating and chronic constipation Genitourinary: pos for urgency and frequency. pos for pressure sens from cystocele, neg for hematuria  Skin: Negative for pallor or rash   Neurological: Negative for weakness, light-headedness, numbness and headaches.  Hematological: Negative for adenopathy. Does not bruise/bleed  easily.  Psychiatric/Behavioral: Negative for dysphoric mood. The patient is not nervous/anxious.         Objective:   Physical Exam  Constitutional: She appears well-developed and well-nourished. No distress.  obese and well appearing   HENT:  Head: Normocephalic and atraumatic.  Eyes:  Conjunctivae and EOM are normal. Pupils are equal, round, and reactive to light.  Neck: Normal range of motion. Neck supple.  Cardiovascular: Normal rate, regular rhythm and normal heart sounds.   Pulmonary/Chest: Effort normal and breath sounds normal.  Abdominal: Soft. Normal aorta and bowel sounds are normal. She exhibits no distension, no pulsatile liver, no abdominal bruit, no ascites and no pulsatile midline mass. There is no hepatosplenomegaly. There is tenderness in the right upper quadrant, epigastric area and suprapubic area. There is no rigidity, no rebound, no guarding, no CVA tenderness, no tenderness at McBurney's point and negative Murphy's sign.  No cva tenderness  Mild suprapubic tenderness  Pt has more prominent rib protrusion on R than left with soft tissue overlying (with fatty texture)-that is nt    Musculoskeletal: She exhibits no edema.  Lymphadenopathy:    She has no cervical adenopathy.  Neurological: She is alert. No cranial nerve deficit. She exhibits normal muscle tone. Coordination normal.  Skin: No rash noted. No erythema. No pallor.  Psychiatric: She has a normal mood and affect.          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  Pt unsure if she is getting better in between utis  Cannot disc cystocele surgery with her urologist until her utis are clear I adv her to get back on macrodantin prophylaxis  Sent urine for culture and sens- tx as necessary with plan  to re check to see if it resolves F/u urol in May as planned       Relevant Orders   Urine culture (Completed)   CT Abdomen Pelvis W Contrast     Other   Abdominal pain, RUQ    Ongoing and worsening  Has had ccy in the past  C/o swollen area-this appears to be over lower ribs and fatty in nature  GI f/u planned in June Ordered CT scan abd and pelvis (pelvis for chronic urinary c/o also) Will see how this looks       Relevant Orders   CT Abdomen Pelvis W Contrast   Comprehensive metabolic panel (Completed)   Dysuria   Relevant Orders   POCT Urinalysis Dipstick (Automated) (Completed)   Hyperglycemia

## 2016-02-18 NOTE — Progress Notes (Signed)
Pre visit review using our clinic review tool, if applicable. No additional management support is needed unless otherwise documented below in the visit note. 

## 2016-02-18 NOTE — Patient Instructions (Signed)
Start your macrodantin as ordered daily  Drink water  Stop at check out for ref for CT scan  We will culture urine and see if we need to add medication or not  Keep your urology appointment  Keep your GI appointment

## 2016-02-20 NOTE — Assessment & Plan Note (Signed)
Recurrent GBS- ? Colonized  Pt unsure if she is getting better in between utis  Cannot disc cystocele surgery with her urologist until her utis are clear I adv her to get back on macrodantin prophylaxis  Sent urine for culture and sens- tx as necessary with plan to re check to see if it resolves F/u urol in May as planned

## 2016-02-20 NOTE — Assessment & Plan Note (Signed)
Pt wants to disc surgery for this with urol in May as it is really bothersome to her

## 2016-02-20 NOTE — Assessment & Plan Note (Signed)
Ongoing and worsening  Has had ccy in the past  C/o swollen area-this appears to be over lower ribs and fatty in nature  GI f/u planned in June Ordered CT scan abd and pelvis (pelvis for chronic urinary c/o also) Will see how this looks

## 2016-02-22 LAB — URINE CULTURE

## 2016-02-23 ENCOUNTER — Telehealth: Payer: Self-pay | Admitting: Family Medicine

## 2016-02-23 DIAGNOSIS — N390 Urinary tract infection, site not specified: Secondary | ICD-10-CM

## 2016-02-23 MED ORDER — AMOXICILLIN 500 MG PO CAPS: 500.0000 mg | ORAL_CAPSULE | Freq: Two times a day (BID) | ORAL | Status: DC

## 2016-02-23 NOTE — Telephone Encounter (Signed)
Please send a copy to her urologist Please call in amoxicillin I want to re check urine culture in about a week to see if the abx works Also continue the macrodantin prophylaxis  thanks

## 2016-02-24 ENCOUNTER — Ambulatory Visit
Admission: RE | Admit: 2016-02-24 | Discharge: 2016-02-24 | Disposition: A | Discharge status: Home / Self Care | Payer: 59 | Source: Ambulatory Visit | Attending: Family Medicine | Admitting: Family Medicine

## 2016-02-24 DIAGNOSIS — R1011 Right upper quadrant pain: Secondary | ICD-10-CM | POA: Insufficient documentation

## 2016-02-24 DIAGNOSIS — K760 Fatty (change of) liver, not elsewhere classified: Secondary | ICD-10-CM | POA: Insufficient documentation

## 2016-02-24 DIAGNOSIS — N8110 Cystocele, unspecified: Secondary | ICD-10-CM | POA: Diagnosis not present

## 2016-02-24 DIAGNOSIS — N200 Calculus of kidney: Secondary | ICD-10-CM | POA: Diagnosis not present

## 2016-02-24 DIAGNOSIS — N390 Urinary tract infection, site not specified: Secondary | ICD-10-CM | POA: Diagnosis not present

## 2016-02-24 DIAGNOSIS — K439 Ventral hernia without obstruction or gangrene: Secondary | ICD-10-CM | POA: Insufficient documentation

## 2016-02-24 MED ORDER — IOPAMIDOL (ISOVUE-300) INJECTION 61%
100.0000 mL | Freq: Once | INTRAVENOUS | Status: AC | PRN
  Administered 2016-02-24: 100 mL via INTRAVENOUS

## 2016-02-24 MED ORDER — ONDANSETRON HCL 4 MG PO TABS: 4.0000 mg | ORAL_TABLET | Freq: Three times a day (TID) | ORAL | Status: DC | PRN

## 2016-02-24 MED ORDER — AMOXICILLIN 500 MG PO CAPS: 500.0000 mg | ORAL_CAPSULE | Freq: Two times a day (BID) | ORAL | Status: DC

## 2016-02-24 NOTE — Telephone Encounter (Signed)
Pt notified Rx sent to pharmacy and I will make sure lab orders are released

## 2016-02-24 NOTE — Telephone Encounter (Signed)
I will send zofran to her pharmacy  I will order the future urine cx order - unsure if this needs to be released and faxed to [facility] also?

## 2016-02-24 NOTE — Telephone Encounter (Signed)
Left voicemail requesting pt to call office back 

## 2016-02-24 NOTE — Telephone Encounter (Signed)
Pt notified of urine cx results and Dr. Marliss Coots comments. Pt said amoxicillin makes her very nauseous and is requesting generic zofran to take when she takes the abx. Also pt would like to have a urine cx order for [facility] so when she needs it rechecked in 2 weeks she will take it to them, please put order in

## 2016-02-25 NOTE — Addendum Note (Signed)
Addended by: Marchia Bond on: 02/25/2016 10:34 AM   Modules accepted: Orders

## 2016-02-25 NOTE — Telephone Encounter (Signed)
CT and culture results sent to pt's urologist

## 2016-03-13 ENCOUNTER — Other Ambulatory Visit: Payer: Self-pay

## 2016-03-13 MED ORDER — ONDANSETRON HCL 4 MG PO TABS: 4.0000 mg | ORAL_TABLET | Freq: Three times a day (TID) | ORAL | Status: DC | PRN

## 2016-03-13 NOTE — Telephone Encounter (Signed)
Please refill times 3 Thanks  

## 2016-03-13 NOTE — Telephone Encounter (Signed)
Rx sent, called pt and no answer and voicemail not set up (it said "the machine is off")

## 2016-03-13 NOTE — Telephone Encounter (Signed)
Pt left v/m; pt on maintenance dose of abx; pt having N&V due to abx and pt request refill generic zofran (last refilled # 20 on 02/24/16) to [facility]; pt has been nauseated for 18 of the past 24 hours.pt last seen 02/18/16. Pt request cb when med refilled.

## 2016-03-16 ENCOUNTER — Encounter: Payer: Self-pay | Admitting: Family Medicine

## 2016-03-19 ENCOUNTER — Encounter: Payer: Self-pay | Admitting: *Deleted

## 2016-04-20 ENCOUNTER — Ambulatory Visit (INDEPENDENT_AMBULATORY_CARE_PROVIDER_SITE_OTHER): Payer: 59 | Admitting: Internal Medicine

## 2016-04-20 ENCOUNTER — Encounter: Payer: Self-pay | Admitting: Internal Medicine

## 2016-04-20 VITALS — BP 136/60 | HR 112 | Ht 66.0 in | Wt 197.5 lb

## 2016-04-20 DIAGNOSIS — K5900 Constipation, unspecified: Secondary | ICD-10-CM | POA: Diagnosis not present

## 2016-04-20 DIAGNOSIS — K760 Fatty (change of) liver, not elsewhere classified: Secondary | ICD-10-CM | POA: Diagnosis not present

## 2016-04-20 DIAGNOSIS — Z86010 Personal history of colonic polyps: Secondary | ICD-10-CM

## 2016-04-20 DIAGNOSIS — K5909 Other constipation: Secondary | ICD-10-CM

## 2016-04-20 MED ORDER — VITAMIN E 180 MG (400 UNIT) PO CAPS: 800.0000 [IU] | ORAL_CAPSULE | Freq: Every day | ORAL | Status: DC

## 2016-04-20 MED ORDER — LINACLOTIDE 145 MCG PO CAPS: 145.0000 ug | ORAL_CAPSULE | Freq: Every day | ORAL | Status: DC

## 2016-04-20 MED ORDER — NA SULFATE-K SULFATE-MG SULF 17.5-3.13-1.6 GM/177ML PO SOLN: ORAL | Status: DC

## 2016-04-20 NOTE — Progress Notes (Signed)
Subjective:    Patient ID: [name] Branch, female    DOB: [birth year], 60 y.o.   MRN: [id number]  HPI Andreia Eleazer is a 60 year old female with sessile serrated polyp, chronic constipation exacerbated by opioids, IBS, CFIDs, narcolepsy and migraines who is here for follow-up. Initially seen in the office in August 2016 she then came for colonoscopy which was performed on 08/13/2015. This showed a 30 millimeter laterally spreading ascending colon polyp of the mucus. This was removed in piecemeal fashion with snare cautery and found to be a sessile serrated polyp without dysplasia or malignancy. The colonoscopy was otherwise unremarkable except for a redundant colon. She returns today stating that she is having issues with constipation. Previously this had improved with fiber supplementation but she states her bowel movements have been harder and harder to pass. She states she'll have a bowel movement every 6-7 days. In the days before her bowel movement she develops abdominal bloating and bulging particularly in the right upper quadrant. She reports feeling "painful knots" in her upper abdomen prior to bowel movements. She then will have a large our movement initially hard followed by loose stools which will last for about a day. Then the cycle starts over. Her abdominal pain, bloating and other symptoms improved dramatically immediately after bowel movement but gradually come back prior to another bowel movement. She denies blood in her stool or melena. She denies any further trouble with heartburn or dysphagia/odynophagia. She has been diagnosed with fatty liver and has had mildly elevated liver enzymes. She denies jaundice, itching, ascites, lower extremity edema or bleeding.   Review of Systems As per history of present illness, otherwise negative  Current Medications, Allergies, Past Medical History, Past Surgical History, Family History and Social History were reviewed in [facility] record.     Objective:   Physical Exam BP 136/60 mmHg  Pulse 112  Ht 5\' 6"  (1.676 m)  Wt 197 lb 8 oz (89.585 kg)  BMI 31.89 kg/m2 Constitutional: Well-developed and well-nourished. No distress. HEENT: Normocephalic and atraumatic. Conjunctivae are normal.  No scleral icterus. Neck: Neck supple. Trachea midline. Cardiovascular: Normal rate, regular rhythm and intact distal pulses. No M/R/G Pulmonary/chest: Effort normal and breath sounds normal. No wheezing, rales or rhonchi. Abdominal: Soft, nontender, nondistended. Bowel sounds active throughout.  Extremities: no clubbing, cyanosis, or edema Lymphadenopathy: No cervical adenopathy noted. Neurological: Alert and oriented to person place and time.No asterixis Skin: Skin is warm and dry. No rashes noted. Psychiatric: Normal mood and affect. Behavior is normal.  CBC    Component Value Date/Time   WBC 6.3 11/12/2015 1506   WBC 9.5 11/17/2011 0249   WBC 5.4 12/31/2008 0000   RBC 4.93 11/12/2015 1506   RBC 4.71 11/17/2011 0249   RBC 4.78 12/31/2008 0000   HGB 14.3 06/26/2014 1133   HGB 13.0 11/17/2011 0249   HCT 40.8 11/12/2015 1506   HCT 42.9 06/26/2014 1133   HCT 38.8 11/17/2011 0249   PLT 236 11/12/2015 1506   PLT 373 11/17/2011 0249   PLT 309 12/31/2008 0000   MCV 83 11/12/2015 1506   MCV 84 06/26/2014 1133   MCV 82 11/17/2011 0249   MCH 27.2 11/12/2015 1506   MCH 27.6 11/17/2011 0249   MCHC 32.8 11/12/2015 1506   MCHC 33.6 11/17/2011 0249   MCHC 34.0 12/31/2008 0000   RDW 14.1 11/12/2015 1506   RDW 14.0 11/17/2011 0249   RDW 13.0 12/31/2008 0000   LYMPHSABS 2.4 11/12/2015 1506  LYMPHSABS 1.2 11/17/2011 0249   MONOABS 0.7 11/17/2011 0249   MONOABS 0.4 12/31/2008 0000   EOSABS 0.3 11/12/2015 1506   EOSABS 0.3 06/26/2014 1133   EOSABS 0.2 11/17/2011 0249   EOSABS 0.3 12/31/2008 0000   BASOSABS 0.0 11/12/2015 1506   BASOSABS 0.0 11/17/2011 0249   BASOSABS 0.1 12/31/2008 0000    CMP       Component Value Date/Time   NA 136 02/18/2016 1402   NA 141 11/12/2015 1506   NA 136 11/17/2011 0249   K 4.6 02/18/2016 1402   K 3.8 11/17/2011 0249   CL 98 02/18/2016 1402   CL 97* 11/17/2011 0249   CO2 33* 02/18/2016 1402   CO2 25 11/17/2011 0249   GLUCOSE 97 02/18/2016 1402   GLUCOSE 90 11/12/2015 1506   GLUCOSE 103* 11/17/2011 0249   BUN 8 02/18/2016 1402   BUN 8 11/12/2015 1506   BUN 4* 11/17/2011 0249   CREATININE 0.62 02/18/2016 1402   CREATININE 0.55* 11/17/2011 0249   CALCIUM 10.4 02/18/2016 1402   CALCIUM 8.8 11/17/2011 0249   PROT 8.1 02/18/2016 1402   PROT 8.4 11/12/2015 1506   PROT 7.2 11/16/2011 0658   ALBUMIN 4.0 02/18/2016 1402   ALBUMIN 4.4 11/12/2015 1506   ALBUMIN 2.9* 11/16/2011 0658   AST 57* 02/18/2016 1402   AST 34 11/16/2011 0658   ALT 33 02/18/2016 1402   ALT 34 11/16/2011 0658   ALKPHOS 109 02/18/2016 1402   ALKPHOS 84 11/16/2011 0658   BILITOT 0.8 02/18/2016 1402   BILITOT 0.6 11/12/2015 1506   BILITOT 0.9 11/16/2011 0658   GFRNONAA 96 11/12/2015 1506   GFRNONAA >60 11/17/2011 0249   GFRAA 110 11/12/2015 1506   GFRAA >60 11/17/2011 0249   CT ABDOMEN AND PELVIS WITH CONTRAST   TECHNIQUE: Multidetector CT imaging of the abdomen and pelvis was performed using the standard protocol following bolus administration of intravenous contrast.   CONTRAST:  161mL ISOVUE-300 IOPAMIDOL (ISOVUE-300) INJECTION 61%   COMPARISON:  12/25/2013   FINDINGS: Lower chest:  No acute findings.   Hepatobiliary: Diffuse hepatic steatosis demonstrated. No liver masses identified. Prior cholecystectomy noted. No evidence of biliary dilatation.   Pancreas: No mass, inflammatory changes, or other significant abnormality.   Spleen: Within normal limits in size and appearance.   Adrenals/Urinary Tract: No masses identified. No evidence of hydronephrosis. A 5 mm nonobstructive calculus is again seen in the lower pole of the right kidney. Unopacified urinary  bladder is unremarkable in appearance except for a mild cystocele due to pelvic floor relaxation.   Stomach/Bowel: No evidence of obstruction, inflammatory process, or abnormal fluid collections. Normal appendix visualized. Duodenal diverticulum incidentally noted. Moderate colonic stool burden also seen.   Vascular/Lymphatic: No pathologically enlarged lymph nodes. No evidence of abdominal aortic aneurysm.   Reproductive: Prior hysterectomy noted. Adnexal regions are unremarkable in appearance.   Other: Small left paraumbilical anterior abdominal wall hernia again seen containing only fat.   Musculoskeletal:  No suspicious bone lesions identified.   IMPRESSION: Stable nonobstructive right nephrolithiasis. No evidence of ureteral calculi, hydronephrosis, or other acute findings.   Stable diffuse hepatic steatosis and small left paraumbilical ventral hernia.   Mild pelvic floor relaxation with small cystocele noted.     Electronically Signed   By: Earle Gell M.D.   On: 02/24/2016 16:40     Assessment & Plan:   60 year old female with sessile serrated polyp, chronic constipation exacerbated by opioids, IBS, CFIDs, narcolepsy and migraines who is here  for follow-up.  1. 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 We discussed the risks, benefits and alternatives and she is agreeable to proceed. We will put this in a 1 hour slot and also with 2 day bowel preparation.  2. Constipation with abdominal bloating and upper abdominal discomfort -- symptoms felt secondary to constipation and likely relate to in part her redundant colonic anatomy. She also takes MSIR chronically which worsens constipation. We will try Linzess 145 g daily. She is asked to notify me if this fails to improve her symptoms or she develops  diarrhea.  3. Fatty liver -- seen by imaging with mild elevation in serum AST. I recommended vitamin E 800 international units daily. We discussed the importance of diet and exercise as much is possible. She does have physical limitations due to pain. I recommended monitoring liver enzymes routinely through primary care. No current evidence of advanced liver disease or cirrhosis.  25 minutes spent with the patient today. Greater than 50% was spent in counseling and coordination of care with the patient

## 2016-04-20 NOTE — Patient Instructions (Signed)
You have been scheduled for a colonoscopy. Please follow written instructions given to you at your visit today.  Please pick up your prep supplies at the pharmacy within the next 1-3 days. If you use inhalers (even only as needed), please bring them with you on the day of your procedure. Your physician has requested that you go to www.startemmi.com and enter the access code given to you at your visit today. This web site gives a general overview about your procedure. However, you should still follow specific instructions given to you by our office regarding your preparation for the procedure.  We have sent the following medications to your pharmacy for you to pick up at your convenience: Linzess 145 mcg daily Vitamin E 800 iu every day  If you are age 32 or older, your body mass index should be between 23-30. Your Body mass index is 31.89 kg/(m^2). If this is out of the aforementioned range listed, please consider follow up with your Primary Care Provider.  If you are age 29 or younger, your body mass index should be between 19-25. Your Body mass index is 31.89 kg/(m^2). If this is out of the aformentioned range listed, please consider follow up with your Primary Care Provider.

## 2016-04-30 ENCOUNTER — Telehealth: Payer: Self-pay | Admitting: *Deleted

## 2016-04-30 NOTE — Telephone Encounter (Signed)
I have attempted to reach patient but there is no answer and no voicemail. I will again attempt to contact patient at a later time.

## 2016-04-30 NOTE — Telephone Encounter (Signed)
Inquire if she has tried Miralax in the past, if not can try 17g daily to twice daily for constipation Have her let me know if this is not helping Explain we have to start here per her insurance policy coverage

## 2016-04-30 NOTE — Telephone Encounter (Signed)
[employer] has denied patient's Linzess (Case #: [id number]) until she has tried and failed polyethylene and/or lactulose. Please advise.Marland KitchenMarland KitchenMarland Kitchen

## 2016-05-01 ENCOUNTER — Encounter: Payer: Self-pay | Admitting: Internal Medicine

## 2016-05-01 NOTE — Telephone Encounter (Signed)
I have left a voicemail for patient to call back. 

## 2016-05-04 NOTE — Telephone Encounter (Signed)
Pt is returning your call  [phone]

## 2016-05-04 NOTE — Telephone Encounter (Signed)
Patient states that she has tried miralax in the past but it wasn't very effective. She has restarted her fiber gummies and will give miralax another try. She will call us back to let us know whether that works or not.

## 2016-05-07 ENCOUNTER — Telehealth: Payer: Self-pay | Admitting: Internal Medicine

## 2016-05-07 NOTE — Telephone Encounter (Signed)
Attempted to return her call  "machine is off".  Unable to leave a message

## 2016-05-08 NOTE — Telephone Encounter (Signed)
Unable to reach pt

## 2016-05-12 ENCOUNTER — Ambulatory Visit (AMBULATORY_SURGERY_CENTER): Payer: 59 | Admitting: Internal Medicine

## 2016-05-12 ENCOUNTER — Encounter: Payer: Self-pay | Admitting: Internal Medicine

## 2016-05-12 VITALS — BP 120/78 | HR 89 | Temp 98.6°F | Resp 13 | Ht 66.0 in | Wt 197.0 lb

## 2016-05-12 DIAGNOSIS — Z86010 Personal history of colonic polyps: Secondary | ICD-10-CM | POA: Diagnosis not present

## 2016-05-12 DIAGNOSIS — D123 Benign neoplasm of transverse colon: Secondary | ICD-10-CM

## 2016-05-12 MED ORDER — SODIUM CHLORIDE 0.9 % IV SOLN: 500.0000 mL | INTRAVENOUS | Status: DC

## 2016-05-12 NOTE — Op Note (Signed)
[facility] Patient Name: [name] Branch Procedure Date: 05/12/2016 2:53 PM MRN: [id number] Endoscopist: Jerene Bears , MD Age: 60 Referring MD:  Date of Birth: [birth year] Gender: Female Account #: [id number] Procedure:                Colonoscopy Indications:              High risk colon cancer surveillance: Personal                            history of sessile serrated colon polyp (10 mm or                            greater in size) removed piecemeal, Last                            colonoscopy: October 2016 Medicines:                Monitored Anesthesia Care Procedure:                Pre-Anesthesia Assessment:                           - Prior to the procedure, a History and Physical                            was performed, and patient medications and                            allergies were reviewed. The patient's tolerance of                            previous anesthesia was also reviewed. The risks                            and benefits of the procedure and the sedation                            options and risks were discussed with the patient.                            All questions were answered, and informed consent                            was obtained. Prior Anticoagulants: The patient has                            taken no previous anticoagulant or antiplatelet                            agents. ASA Grade Assessment: II - A patient with                            mild systemic disease. After reviewing the risks  and benefits, the patient was deemed in                            satisfactory condition to undergo the procedure.                           After obtaining informed consent, the colonoscope                            was passed under direct vision. Throughout the                            procedure, the patient's blood pressure, pulse, and                            oxygen saturations were monitored continuously.  The                            Model PCF-H190L [id number]) scope was introduced                            through the anus and advanced to the the cecum,                            identified by appendiceal orifice and ileocecal                            valve. The colonoscopy was performed without                            difficulty. The patient tolerated the procedure                            well. The quality of the bowel preparation was                            good. The ileocecal valve, appendiceal orifice, and                            rectum were photographed. Scope In: 3:04:31 PM Scope Out: 3:27:40 PM Scope Withdrawal Time: 0 hours 15 minutes 10 seconds  Total Procedure Duration: 0 hours 23 minutes 9 seconds  Findings:                 The digital rectal exam was normal.                           A tattoo was seen in the ascending colon. A                            post-polypectomy scar was found at the tattoo site                            with no residual polyp seen.  Two sessile polyps were found in the transverse                            colon. The polyps were 2 to 4 mm in size. These                            polyps were removed with a cold snare. Resection                            and retrieval were complete.                           Internal hemorrhoids were found during                            retroflexion. The hemorrhoids were small. Complications:            No immediate complications. Estimated Blood Loss:     Estimated blood loss was minimal. Impression:               - A tattoo was seen in the ascending colon. A                            post-polypectomy scar was found at the tattoo site                            with no residual polyp seen.                           - Two 2 to 4 mm polyps in the transverse colon,                            removed with a cold snare. Resected and retrieved.                           -  Internal hemorrhoids. Recommendation:           - Patient has a contact number available for                            emergencies. The signs and symptoms of potential                            delayed complications were discussed with the                            patient. Return to normal activities tomorrow.                            Written discharge instructions were provided to the                            patient.                           -  Resume previous diet.                           - Continue present medications.                           - Await pathology results.                           - Repeat colonoscopy in 3 years for surveillance of                            multiple polyps. Jerene Bears, MD 05/12/2016 3:35:41 PM This report has been signed electronically.

## 2016-05-12 NOTE — Progress Notes (Signed)
Report to PACU, RN, vss, BBS= Clear.  

## 2016-05-12 NOTE — Patient Instructions (Signed)
YOU HAD AN ENDOSCOPIC PROCEDURE TODAY AT [facility]:   Refer to the procedure report that was given to you for any specific questions about what was found during the examination.  If the procedure report does not answer your questions, please call your gastroenterologist to clarify.  If you requested that your care partner not be given the details of your procedure findings, then the procedure report has been included in a sealed envelope for you to review at your convenience later.  YOU SHOULD EXPECT: Some feelings of bloating in the abdomen. Passage of more gas than usual.  Walking can help get rid of the air that was put into your GI tract during the procedure and reduce the bloating. If you had a lower endoscopy (such as a colonoscopy or flexible sigmoidoscopy) you may notice spotting of blood in your stool or on the toilet paper. If you underwent a bowel prep for your procedure, you may not have a normal bowel movement for a few days.  Please Note:  You might notice some irritation and congestion in your nose or some drainage.  This is from the oxygen used during your procedure.  There is no need for concern and it should clear up in a day or so.  SYMPTOMS TO REPORT IMMEDIATELY:   Following lower endoscopy (colonoscopy or flexible sigmoidoscopy):  Excessive amounts of blood in the stool  Significant tenderness or worsening of abdominal pains  Swelling of the abdomen that is new, acute  Fever of 100F or higher   For urgent or emergent issues, a gastroenterologist can be reached at any hour by calling [phone].   DIET: Your first meal following the procedure should be a small meal and then it is ok to progress to your normal diet. Heavy or fried foods are harder to digest and may make you feel nauseous or bloated.  Likewise, meals heavy in dairy and vegetables can increase bloating.  Drink plenty of fluids but you should avoid alcoholic beverages for 24  hours.  ACTIVITY:  You should plan to take it easy for the rest of today and you should NOT DRIVE or use heavy machinery until tomorrow (because of the sedation medicines used during the test).    FOLLOW UP: Our staff will call the number listed on your records the next business day following your procedure to check on you and address any questions or concerns that you may have regarding the information given to you following your procedure. If we do not reach you, we will leave a message.  However, if you are feeling well and you are not experiencing any problems, there is no need to return our call.  We will assume that you have returned to your regular daily activities without incident.  If any biopsies were taken you will be contacted by phone or by letter within the next 1-3 weeks.  Please call us at [phone] if you have not heard about the biopsies in 3 weeks.    SIGNATURES/CONFIDENTIALITY: You and/or your care partner have signed paperwork which will be entered into your electronic medical record.  These signatures attest to the fact that that the information above on your After Visit Summary has been reviewed and is understood.  Full responsibility of the confidentiality of this discharge information lies with you and/or your care-partner.  You will need another colonoscopy in 3 years. We will send you a reminder letter. Thank-you for choosing [place] for your healthcare needs today.

## 2016-05-12 NOTE — Progress Notes (Signed)
Called to room to assist during endoscopic procedure.  Patient ID and intended procedure confirmed with present staff. Received instructions for my participation in the procedure from the performing physician.  

## 2016-05-13 ENCOUNTER — Telehealth: Payer: Self-pay

## 2016-05-13 NOTE — Telephone Encounter (Signed)
  Follow up Call-  Call back number 05/12/2016 08/13/2015  Post procedure Call Back phone  # [phone]  Permission to leave phone message Yes Yes     Patient questions:  Do you have a fever, pain , or abdominal swelling? No. Pain Score  0 *  Have you tolerated food without any problems? Yes.    Have you been able to return to your normal activities? Yes.    Do you have any questions about your discharge instructions: Diet   No. Medications  No. Follow up visit  No.  Do you have questions or concerns about your Care? No.  Actions: * If pain score is 4 or above: No action needed, pain <4.

## 2016-05-14 ENCOUNTER — Encounter: Payer: Self-pay | Admitting: Internal Medicine

## 2016-05-21 ENCOUNTER — Telehealth: Payer: Self-pay | Admitting: Internal Medicine

## 2016-05-21 NOTE — Telephone Encounter (Signed)
The pt had a colon on 05/12/16   Impression: - A tattoo was seen in the ascending colon. A post-polypectomy scar was found at the tattoo site with no residual polyp seen. - Two 2 to 4 mm polyps in the transverse colon, removed with a cold snare. Resected and retrieved. - Internal hemorrhoids.  Pt has had bloody bowel movements with clots, and abd pain.  I have tried to reach the pt but the phone just rings and a message comes on stating the machine is turned off.  I will try again later in the day.

## 2016-05-27 ENCOUNTER — Telehealth: Payer: Self-pay | Admitting: Internal Medicine

## 2016-05-27 NOTE — Telephone Encounter (Signed)
Pt states she has been having pain ever since she had her colon done. States the pain started in the area where the original polyp was removed. Now she also has pain on the left that is not as bad but states it runs all across the abdomen now. Report is hurts really bad when she moves certain ways. States she is quite nauseated, it took her 7 1/2 days to have a bowel movement and there there was BRB in the stool for 2 days, this has since cleared. Pt states she does not have a fever. Tried to schedule pt for an OV with an APP tomorrow but pt states she cannot come until next week due to no transportation. States she will go to the ER if things get worse but wanted to know if there is anything else Dr. Hilarie Fredrickson recommends. Please advise.

## 2016-05-28 NOTE — Telephone Encounter (Signed)
Left message for pt to call back  °

## 2016-05-28 NOTE — Telephone Encounter (Signed)
CT scan abd/pelvis would be needed to rule out a procedural complication This could be related to abdominal muscle soreness after the procedure. If pain persists, I would proceed with CT abd/pelvis to rule out complication of recent colonoscopy along with office followup

## 2016-06-01 NOTE — Telephone Encounter (Signed)
Spoke with pt and she states she is not feeling better, c/o lots of nausea. Pt states she is going to see her PCP Dr. Glori Bickers. Pt instructed to call back if she needed to see [place] after seeing her PCP.

## 2016-09-10 NOTE — Telephone Encounter (Signed)
Opened in error

## 2016-10-16 ENCOUNTER — Encounter: Payer: Self-pay | Admitting: Family Medicine

## 2016-10-16 DIAGNOSIS — N390 Urinary tract infection, site not specified: Secondary | ICD-10-CM

## 2016-11-04 DIAGNOSIS — N390 Urinary tract infection, site not specified: Secondary | ICD-10-CM | POA: Diagnosis not present

## 2016-11-06 LAB — URINE CULTURE

## 2016-12-24 DIAGNOSIS — Z8619 Personal history of other infectious and parasitic diseases: Secondary | ICD-10-CM | POA: Diagnosis not present

## 2016-12-24 DIAGNOSIS — R5382 Chronic fatigue, unspecified: Secondary | ICD-10-CM | POA: Diagnosis not present

## 2016-12-24 DIAGNOSIS — G47411 Narcolepsy with cataplexy: Secondary | ICD-10-CM | POA: Diagnosis not present

## 2016-12-24 DIAGNOSIS — M797 Fibromyalgia: Secondary | ICD-10-CM | POA: Diagnosis not present

## 2017-03-19 DIAGNOSIS — R5382 Chronic fatigue, unspecified: Secondary | ICD-10-CM | POA: Diagnosis not present

## 2017-03-19 DIAGNOSIS — M797 Fibromyalgia: Secondary | ICD-10-CM | POA: Diagnosis not present

## 2017-03-19 DIAGNOSIS — Z8619 Personal history of other infectious and parasitic diseases: Secondary | ICD-10-CM | POA: Diagnosis not present

## 2017-05-23 ENCOUNTER — Encounter: Payer: Self-pay | Admitting: Emergency Medicine

## 2017-05-23 ENCOUNTER — Inpatient Hospital Stay
Admission: EM | Admit: 2017-05-23 | Discharge: 2017-05-28 | DRG: 871 | Disposition: A | Discharge status: Home / Self Care | Payer: 59 | Attending: Internal Medicine | Admitting: Internal Medicine

## 2017-05-23 DIAGNOSIS — R05 Cough: Secondary | ICD-10-CM

## 2017-05-23 DIAGNOSIS — K5909 Other constipation: Secondary | ICD-10-CM | POA: Diagnosis present

## 2017-05-23 DIAGNOSIS — R109 Unspecified abdominal pain: Secondary | ICD-10-CM | POA: Diagnosis not present

## 2017-05-23 DIAGNOSIS — J449 Chronic obstructive pulmonary disease, unspecified: Secondary | ICD-10-CM | POA: Diagnosis present

## 2017-05-23 DIAGNOSIS — Z833 Family history of diabetes mellitus: Secondary | ICD-10-CM

## 2017-05-23 DIAGNOSIS — Z87442 Personal history of urinary calculi: Secondary | ICD-10-CM

## 2017-05-23 DIAGNOSIS — J810 Acute pulmonary edema: Secondary | ICD-10-CM | POA: Diagnosis present

## 2017-05-23 DIAGNOSIS — G47419 Narcolepsy without cataplexy: Secondary | ICD-10-CM | POA: Diagnosis present

## 2017-05-23 DIAGNOSIS — R5382 Chronic fatigue, unspecified: Secondary | ICD-10-CM | POA: Diagnosis present

## 2017-05-23 DIAGNOSIS — Z90710 Acquired absence of both cervix and uterus: Secondary | ICD-10-CM

## 2017-05-23 DIAGNOSIS — G894 Chronic pain syndrome: Secondary | ICD-10-CM | POA: Diagnosis present

## 2017-05-23 DIAGNOSIS — N12 Tubulo-interstitial nephritis, not specified as acute or chronic: Secondary | ICD-10-CM | POA: Diagnosis present

## 2017-05-23 DIAGNOSIS — R1011 Right upper quadrant pain: Secondary | ICD-10-CM | POA: Diagnosis not present

## 2017-05-23 DIAGNOSIS — A419 Sepsis, unspecified organism: Secondary | ICD-10-CM | POA: Diagnosis not present

## 2017-05-23 DIAGNOSIS — K589 Irritable bowel syndrome without diarrhea: Secondary | ICD-10-CM | POA: Diagnosis not present

## 2017-05-23 DIAGNOSIS — Z9889 Other specified postprocedural states: Secondary | ICD-10-CM

## 2017-05-23 DIAGNOSIS — R059 Cough, unspecified: Secondary | ICD-10-CM

## 2017-05-23 DIAGNOSIS — R0600 Dyspnea, unspecified: Secondary | ICD-10-CM

## 2017-05-23 DIAGNOSIS — N202 Calculus of kidney with calculus of ureter: Secondary | ICD-10-CM | POA: Diagnosis present

## 2017-05-23 DIAGNOSIS — Z803 Family history of malignant neoplasm of breast: Secondary | ICD-10-CM

## 2017-05-23 DIAGNOSIS — R0902 Hypoxemia: Secondary | ICD-10-CM | POA: Diagnosis present

## 2017-05-23 DIAGNOSIS — R52 Pain, unspecified: Secondary | ICD-10-CM | POA: Diagnosis not present

## 2017-05-23 DIAGNOSIS — R110 Nausea: Secondary | ICD-10-CM | POA: Diagnosis not present

## 2017-05-23 DIAGNOSIS — Z87440 Personal history of urinary (tract) infections: Secondary | ICD-10-CM

## 2017-05-23 DIAGNOSIS — Z882 Allergy status to sulfonamides status: Secondary | ICD-10-CM

## 2017-05-23 DIAGNOSIS — N3000 Acute cystitis without hematuria: Secondary | ICD-10-CM | POA: Diagnosis present

## 2017-05-23 DIAGNOSIS — N814 Uterovaginal prolapse, unspecified: Secondary | ICD-10-CM | POA: Diagnosis present

## 2017-05-23 DIAGNOSIS — Z9049 Acquired absence of other specified parts of digestive tract: Secondary | ICD-10-CM

## 2017-05-23 DIAGNOSIS — Z8249 Family history of ischemic heart disease and other diseases of the circulatory system: Secondary | ICD-10-CM

## 2017-05-23 DIAGNOSIS — N132 Hydronephrosis with renal and ureteral calculous obstruction: Secondary | ICD-10-CM

## 2017-05-23 DIAGNOSIS — Z823 Family history of stroke: Secondary | ICD-10-CM

## 2017-05-23 DIAGNOSIS — Z90711 Acquired absence of uterus with remaining cervical stump: Secondary | ICD-10-CM

## 2017-05-23 DIAGNOSIS — B951 Streptococcus, group B, as the cause of diseases classified elsewhere: Secondary | ICD-10-CM | POA: Diagnosis present

## 2017-05-23 HISTORY — DX: Myoneural disorder, unspecified: G70.9

## 2017-05-23 LAB — LIPASE, BLOOD: LIPASE: 23 U/L (ref 11–51)

## 2017-05-23 LAB — COMPREHENSIVE METABOLIC PANEL
ALBUMIN: 4.1 g/dL (ref 3.5–5.0)
ALK PHOS: 126 U/L (ref 38–126)
ALT: 31 U/L (ref 14–54)
AST: 44 U/L — AB (ref 15–41)
Anion gap: 8 (ref 5–15)
BILIRUBIN TOTAL: 1.3 mg/dL — AB (ref 0.3–1.2)
BUN: 15 mg/dL (ref 6–20)
CALCIUM: 9.2 mg/dL (ref 8.9–10.3)
CO2: 28 mmol/L (ref 22–32)
CREATININE: 0.62 mg/dL (ref 0.44–1.00)
Chloride: 98 mmol/L — ABNORMAL LOW (ref 101–111)
GFR calc Af Amer: >60 mL/min (ref >60)
GLUCOSE: 125 mg/dL — AB (ref 65–99)
POTASSIUM: 3.8 mmol/L (ref 3.5–5.1)
Sodium: 134 mmol/L — ABNORMAL LOW (ref 135–145)
TOTAL PROTEIN: 8.4 g/dL — AB (ref 6.5–8.1)

## 2017-05-23 LAB — BLOOD CULTURE ID PANEL (REFLEXED)
ACINETOBACTER BAUMANNII: NOT DETECTED
CANDIDA ALBICANS: NOT DETECTED
CANDIDA GLABRATA: NOT DETECTED
CANDIDA PARAPSILOSIS: NOT DETECTED
CANDIDA TROPICALIS: NOT DETECTED
Candida krusei: NOT DETECTED
ENTEROBACTER CLOACAE COMPLEX: NOT DETECTED
ENTEROBACTERIACEAE SPECIES: NOT DETECTED
Enterococcus species: NOT DETECTED
Escherichia coli: NOT DETECTED
Haemophilus influenzae: NOT DETECTED
KLEBSIELLA OXYTOCA: NOT DETECTED
KLEBSIELLA PNEUMONIAE: NOT DETECTED
Listeria monocytogenes: NOT DETECTED
NEISSERIA MENINGITIDIS: NOT DETECTED
PROTEUS SPECIES: NOT DETECTED
Pseudomonas aeruginosa: NOT DETECTED
STAPHYLOCOCCUS SPECIES: NOT DETECTED
STREPTOCOCCUS AGALACTIAE: DETECTED — AB
Serratia marcescens: NOT DETECTED
Staphylococcus aureus (BCID): NOT DETECTED
Streptococcus pneumoniae: NOT DETECTED
Streptococcus pyogenes: NOT DETECTED
Streptococcus species: DETECTED — AB

## 2017-05-23 LAB — URINALYSIS, COMPLETE (UACMP) WITH MICROSCOPIC
BILIRUBIN URINE: NEGATIVE
Glucose, UA: NEGATIVE mg/dL
HGB URINE DIPSTICK: NEGATIVE
Ketones, ur: NEGATIVE mg/dL
NITRITE: POSITIVE — AB
Protein, ur: NEGATIVE mg/dL
Specific Gravity, Urine: 1.014 (ref 1.005–1.030)
pH: 7 (ref 5.0–8.0)

## 2017-05-23 LAB — CBC
HEMATOCRIT: 38.9 % (ref 35.0–47.0)
Hemoglobin: 12.9 g/dL (ref 12.0–16.0)
MCH: 26.9 pg (ref 26.0–34.0)
MCHC: 33.1 g/dL (ref 32.0–36.0)
MCV: 81.4 fL (ref 80.0–100.0)
PLATELETS: 174 10*3/uL (ref 150–440)
RBC: 4.78 MIL/uL (ref 3.80–5.20)
RDW: 14.7 % — AB (ref 11.5–14.5)
WBC: 7.9 10*3/uL (ref 3.6–11.0)

## 2017-05-23 LAB — TSH: TSH: 1.257 u[IU]/mL (ref 0.350–4.500)

## 2017-05-23 LAB — LACTIC ACID, PLASMA: LACTIC ACID, VENOUS: 0.9 mmol/L (ref 0.5–1.9)

## 2017-05-23 MED ORDER — PROCHLORPERAZINE EDISYLATE 5 MG/ML IJ SOLN
10.0000 mg | Freq: Four times a day (QID) | INTRAMUSCULAR | Status: DC | PRN
  Administered 2017-05-23 – 2017-05-25 (×5): 10 mg via INTRAVENOUS
  Filled 2017-05-23 (×6): qty 2

## 2017-05-23 MED ORDER — ONDANSETRON HCL 4 MG/2ML IJ SOLN
4.0000 mg | Freq: Once | INTRAMUSCULAR | Status: AC
  Administered 2017-05-23: 4 mg via INTRAVENOUS
  Filled 2017-05-23: qty 2

## 2017-05-23 MED ORDER — ACETAMINOPHEN 650 MG RE SUPP: 650.0000 mg | Freq: Four times a day (QID) | RECTAL | Status: DC | PRN

## 2017-05-23 MED ORDER — MAGNESIUM OXIDE 400 (241.3 MG) MG PO TABS
400.0000 mg | ORAL_TABLET | Freq: Every day | ORAL | Status: DC
  Administered 2017-05-24 – 2017-05-28 (×5): 400 mg via ORAL
  Filled 2017-05-23 (×6): qty 1

## 2017-05-23 MED ORDER — PROCHLORPERAZINE EDISYLATE 5 MG/ML IJ SOLN
10.0000 mg | Freq: Once | INTRAMUSCULAR | Status: AC
  Administered 2017-05-23: 10 mg via INTRAVENOUS

## 2017-05-23 MED ORDER — SODIUM CHLORIDE 0.9 % IV BOLUS (SEPSIS)
2000.0000 mL | Freq: Once | INTRAVENOUS | Status: AC
  Administered 2017-05-23: 2000 mL via INTRAVENOUS

## 2017-05-23 MED ORDER — SENNA 8.6 MG PO TABS
1.0000 | ORAL_TABLET | Freq: Two times a day (BID) | ORAL | Status: DC
  Administered 2017-05-23 – 2017-05-28 (×7): 8.6 mg via ORAL
  Filled 2017-05-23 (×11): qty 1

## 2017-05-23 MED ORDER — POLYETHYLENE GLYCOL 3350 17 G PO PACK
17.0000 g | PACK | Freq: Every day | ORAL | Status: DC | PRN
  Administered 2017-05-24: 17 g via ORAL
  Filled 2017-05-23: qty 1

## 2017-05-23 MED ORDER — CIPROFLOXACIN IN D5W 400 MG/200ML IV SOLN
400.0000 mg | Freq: Two times a day (BID) | INTRAVENOUS | Status: DC
  Filled 2017-05-23: qty 200

## 2017-05-23 MED ORDER — OXYCODONE-ACETAMINOPHEN 5-325 MG PO TABS: 1.0000 | ORAL_TABLET | Freq: Four times a day (QID) | ORAL | Status: DC | PRN

## 2017-05-23 MED ORDER — ORAL CARE MOUTH RINSE
15.0000 mL | Freq: Two times a day (BID) | OROMUCOSAL | Status: DC
  Administered 2017-05-23 – 2017-05-28 (×7): 15 mL via OROMUCOSAL

## 2017-05-23 MED ORDER — DEXTROSE 5 % IV SOLN
2.0000 g | Freq: Once | INTRAVENOUS | Status: DC
  Filled 2017-05-23: qty 2

## 2017-05-23 MED ORDER — PROCHLORPERAZINE EDISYLATE 5 MG/ML IJ SOLN
INTRAMUSCULAR | Status: AC
  Filled 2017-05-23: qty 2

## 2017-05-23 MED ORDER — PROMETHAZINE HCL 25 MG PO TABS
25.0000 mg | ORAL_TABLET | Freq: Four times a day (QID) | ORAL | Status: DC | PRN
  Filled 2017-05-23: qty 1

## 2017-05-23 MED ORDER — DEXTROSE 5 % IV SOLN
2.0000 g | INTRAVENOUS | Status: DC
  Administered 2017-05-24 – 2017-05-27 (×4): 2 g via INTRAVENOUS
  Filled 2017-05-23 (×5): qty 2

## 2017-05-23 MED ORDER — SIMETHICONE 80 MG PO CHEW
80.0000 mg | CHEWABLE_TABLET | Freq: Four times a day (QID) | ORAL | Status: DC | PRN
  Filled 2017-05-23: qty 1

## 2017-05-23 MED ORDER — VITAMIN E 180 MG (400 UNIT) PO CAPS
800.0000 [IU] | ORAL_CAPSULE | Freq: Every day | ORAL | Status: DC
  Administered 2017-05-24 – 2017-05-28 (×5): 800 [IU] via ORAL
  Filled 2017-05-23 (×6): qty 2

## 2017-05-23 MED ORDER — SODIUM CHLORIDE 0.9 % IV SOLN
INTRAVENOUS | Status: DC
  Administered 2017-05-23 – 2017-05-26 (×5): via INTRAVENOUS

## 2017-05-23 MED ORDER — ADULT MULTIVITAMIN W/MINERALS CH
1.0000 | ORAL_TABLET | Freq: Every day | ORAL | Status: DC
  Administered 2017-05-24 – 2017-05-28 (×5): 1 via ORAL
  Filled 2017-05-23 (×6): qty 1

## 2017-05-23 MED ORDER — ONDANSETRON HCL 4 MG/2ML IJ SOLN: 4.0000 mg | Freq: Four times a day (QID) | INTRAMUSCULAR | Status: DC | PRN

## 2017-05-23 MED ORDER — ORPHENADRINE CITRATE ER 100 MG PO TB12
100.0000 mg | ORAL_TABLET | Freq: Two times a day (BID) | ORAL | Status: DC | PRN
Start: 2017-05-23 — End: 2017-05-28
  Filled 2017-05-23: qty 1

## 2017-05-23 MED ORDER — BISACODYL 10 MG RE SUPP
10.0000 mg | Freq: Every day | RECTAL | Status: DC
  Administered 2017-05-23 – 2017-05-26 (×4): 10 mg via RECTAL
  Filled 2017-05-23 (×6): qty 1

## 2017-05-23 MED ORDER — ENOXAPARIN SODIUM 40 MG/0.4ML ~~LOC~~ SOLN
40.0000 mg | SUBCUTANEOUS | Status: DC
  Administered 2017-05-23 – 2017-05-27 (×5): 40 mg via SUBCUTANEOUS
  Filled 2017-05-23 (×5): qty 0.4

## 2017-05-23 MED ORDER — ONDANSETRON HCL 4 MG PO TABS: 4.0000 mg | ORAL_TABLET | Freq: Four times a day (QID) | ORAL | Status: DC | PRN

## 2017-05-23 MED ORDER — MORPHINE SULFATE ER 15 MG PO TBCR
15.0000 mg | EXTENDED_RELEASE_TABLET | Freq: Two times a day (BID) | ORAL | Status: DC
  Administered 2017-05-23 – 2017-05-28 (×11): 15 mg via ORAL
  Filled 2017-05-23 (×11): qty 1

## 2017-05-23 MED ORDER — ACETAMINOPHEN 325 MG PO TABS
650.0000 mg | ORAL_TABLET | Freq: Four times a day (QID) | ORAL | Status: DC | PRN
  Administered 2017-05-24 – 2017-05-25 (×3): 650 mg via ORAL
  Filled 2017-05-23 (×4): qty 2

## 2017-05-23 MED ORDER — MORPHINE SULFATE (PF) 2 MG/ML IV SOLN
2.0000 mg | INTRAVENOUS | Status: DC | PRN
  Administered 2017-05-23: 2 mg via INTRAVENOUS
  Filled 2017-05-23: qty 1

## 2017-05-23 MED ORDER — CIPROFLOXACIN IN D5W 400 MG/200ML IV SOLN
400.0000 mg | Freq: Once | INTRAVENOUS | Status: AC
  Administered 2017-05-23: 400 mg via INTRAVENOUS
  Filled 2017-05-23: qty 200

## 2017-05-23 MED ORDER — ALBUTEROL SULFATE (2.5 MG/3ML) 0.083% IN NEBU: 2.5000 mg | INHALATION_SOLUTION | RESPIRATORY_TRACT | Status: DC | PRN

## 2017-05-23 MED ORDER — FENTANYL CITRATE (PF) 100 MCG/2ML IJ SOLN
100.0000 ug | Freq: Once | INTRAMUSCULAR | Status: AC
  Administered 2017-05-23: 100 ug via INTRAVENOUS
  Filled 2017-05-23: qty 2

## 2017-05-23 MED ORDER — VITAMIN D 1000 UNITS PO TABS
1000.0000 [IU] | ORAL_TABLET | Freq: Every day | ORAL | Status: DC
  Administered 2017-05-24 – 2017-05-28 (×5): 1000 [IU] via ORAL
  Filled 2017-05-23 (×6): qty 1

## 2017-05-23 MED ORDER — CEFTRIAXONE SODIUM 1 G IJ SOLR
1.0000 g | Freq: Once | INTRAMUSCULAR | Status: AC
  Administered 2017-05-23: 22:00:00 1 g via INTRAVENOUS
  Filled 2017-05-23: qty 10

## 2017-05-23 MED ORDER — MORPHINE SULFATE 30 MG PO TABS: 30.0000 mg | ORAL_TABLET | ORAL | Status: DC | PRN

## 2017-05-23 MED ORDER — MODAFINIL 100 MG PO TABS: 50.0000 mg | ORAL_TABLET | Freq: Every day | ORAL | Status: DC | PRN

## 2017-05-23 MED ORDER — LINACLOTIDE 145 MCG PO CAPS
145.0000 ug | ORAL_CAPSULE | Freq: Every day | ORAL | Status: DC
  Administered 2017-05-24 – 2017-05-28 (×4): 145 ug via ORAL
  Filled 2017-05-23 (×5): qty 1

## 2017-05-23 MED ORDER — DEXTROSE 5 % IV SOLN
1.0000 g | Freq: Every day | INTRAVENOUS | Status: DC
  Administered 2017-05-23: 13:00:00 1 g via INTRAVENOUS
  Filled 2017-05-23 (×2): qty 10

## 2017-05-23 MED ORDER — ONDANSETRON HCL 4 MG/2ML IJ SOLN
4.0000 mg | Freq: Four times a day (QID) | INTRAMUSCULAR | Status: DC
  Administered 2017-05-23 – 2017-05-28 (×19): 4 mg via INTRAVENOUS
  Filled 2017-05-23 (×19): qty 2

## 2017-05-23 NOTE — ED Provider Notes (Signed)
[facility] Emergency Department Provider Note  ____________________________________________   First MD Initiated Contact with Patient 05/23/17 0430     (approximate)  I have reviewed the triage vital signs and the nursing notes.   HISTORY  Chief Complaint Nausea and Abdominal Pain    HPI [name] Branch is a 61 y.o. female who comes the emergency department via EMS with several hours of fever to 102 right flank pain radiating to her back nausea and vomiting. She has a comp clicks past medical history including bladder prolapse, chronic fatigue syndrome, and chronic myopathy. She's had no recent urinary tract infections. She has an abdominal surgical history of a partial hysterectomy as well as a laparoscopic cholecystectomy. She reports normal bowel movements and flatus. Nothing seems to make the pain better or worse. The pain is severe.   Past Medical History:  Diagnosis Date  . Allergic rhinitis   . 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 Dr. Cristy Friedlander   . Chronic gastritis   . Hepatic steatosis   . Hyperlipidemia   . IBS (irritable bowel syndrome)   . Lyme disease    "stari"  . Migraine   . Narcolepsy   . Rectal prolapse   . Serrated adenoma of colon   . Umbilical hernia   . Vaginal prolapse   . West Nile Virus infection 2003    Patient Active Problem List   Diagnosis Date Noted  . Pyelonephritis 05/23/2017  . Frequent UTI 02/18/2016  . Abdominal pain, RUQ 02/18/2016  . Shortness of breath 01/29/2016  . Fever, unspecified 11/12/2015  . Tremor 11/12/2015  . Chronic nausea 11/12/2015  . UTI (urinary tract infection) 11/08/2015  . Productive cough 11/08/2015  . Constipation 04/12/2015  . Cystocele 04/12/2015  . Routine general medical examination at a health care facility 06/26/2014  . Osteopenia 06/26/2014  . Hyperglycemia 06/26/2014  . Colon cancer screening 06/26/2014    . Dysuria 06/26/2014  . Intertrigo 07/12/2013  . Chronic fatigue syndrome 01/19/2013  . Elevated alkaline phosphatase level 09/19/2012  . Edema 09/16/2012  . FREQUENCY, URINARY 08/06/2010  . Diarrhea 04/14/2010  . CHOLELITHIASIS 01/02/2009  . CONSTIPATION, CHRONIC 07/30/2008  . Irritable bowel syndrome 07/30/2008  . PURE HYPERCHOLESTEROLEMIA 06/21/2008  . POSTMENOPAUSAL STATUS 06/21/2008  . VAGINITIS, ATROPHIC 06/21/2008  . Pain in limb 06/21/2008    Past Surgical History:  Procedure Laterality Date  . 5th R toe fx  '09  . BREAST BIOPSY  '04   neg; right  . CHOLECYSTECTOMY  10/2011  . laprascopy    . PARTIAL HYSTERECTOMY     bleeding  . TONSILLECTOMY    . ULNAR NERVE REPAIR     sx times 5  . UPPER GASTROINTESTINAL ENDOSCOPY  12/04/2004   gastritis    Prior to Admission medications   Medication Sig Start Date End Date Taking? Authorizing Provider  morphine (MSIR) 30 MG tablet Take 30 mg by mouth every 3 (three) hours as needed for severe pain. UAD PRN   Yes [provider]  promethazine (PHENERGAN) 25 MG tablet Take 1 tablet (25 mg total) by mouth every 6 (six) hours as needed for nausea. 01/19/13  Yes Bedsole, Amy E, MD    Allergies Sulfonamide derivatives  Family History  Problem Relation Age of Onset  . Diabetes Father   . Hypertension Father   . Stroke Father   . Hypertension Mother   . Cancer Mother  [place]  . Ulcers Mother   . Breast cancer Paternal Grandmother   . Bipolar disorder Unknown        some family  . Alcohol abuse Maternal Grandfather   . Colon cancer Neg Hx     Social History Social History  Substance Use Topics  . Smoking status: Never Smoker  . Smokeless tobacco: Never Used     Comment: non smoker  . Alcohol use No    Review of Systems Constitutional: Positive fever Eyes: No visual changes. ENT: No sore throat. Cardiovascular: Denies chest pain. Respiratory: Denies shortness of breath. Gastrointestinal:  Positive abdominal pain.  Positive nausea, positive vomiting.  No diarrhea.  No constipation. Genitourinary: Negative for dysuria. Musculoskeletal: Negative for back pain. Skin: Negative for rash. Neurological: Negative for headaches, focal weakness or numbness.   ____________________________________________   PHYSICAL EXAM:  VITAL SIGNS: ED Triage Vitals  Enc Vitals Group     BP 05/23/17 0326 (!) 148/72     Pulse Rate 05/23/17 0326 (!) 104     Resp 05/23/17 0326 18     Temp 05/23/17 0326 99.9 F (37.7 C)     Temp src --      SpO2 05/23/17 0326 97 %     Weight 05/23/17 0326 190 lb (86.2 kg)     Height 05/23/17 0326 5\' 6"  (1.676 m)     Head Circumference --      Peak Flow --      Pain Score 05/23/17 0324 9     Pain Loc --      Pain Edu? --      Excl. in [place]? --     Constitutional: Alert and oriented 4 curled onto her side retching and vomiting appears extremely uncomfortable Eyes: PERRL EOMI. Head: Atraumatic. Nose: No congestion/rhinnorhea. Mouth/Throat: No trismus Neck: No stridor.   Cardiovascular: Normal rate, regular rhythm. Grossly normal heart sounds.  Good peripheral circulation. Respiratory: Increased respiratory effort.  No retractions. Lungs CTAB and moving good air Gastrointestinal: Soft abdomen nontender exquisite costovertebral tenderness on the right no frank peritonitis in her abdomen or McBurney's tenderness Musculoskeletal: No lower extremity edema   Neurologic:  Normal speech and language. No gross focal neurologic deficits are appreciated. Skin:  Skin is warm, dry and intact. No rash noted. Psychiatric: Mood and affect are normal. Speech and behavior are normal.    ____________________________________________   DIFFERENTIAL includes but not limited to  Nephritis, renal colic, appendicitis, diverticulitis ____________________________________________   LABS (all labs ordered are listed, but only abnormal results are displayed)  Labs Reviewed    COMPREHENSIVE METABOLIC PANEL - Abnormal; Notable for the following:       Result Value   Sodium 134 (*)    Chloride 98 (*)    Glucose, Bld 125 (*)    Total Protein 8.4 (*)    AST 44 (*)    Total Bilirubin 1.3 (*)    All other components within normal limits  CBC - Abnormal; Notable for the following:    RDW 14.7 (*)    All other components within normal limits  URINALYSIS, COMPLETE (UACMP) WITH MICROSCOPIC - Abnormal; Notable for the following:    Color, Urine AMBER (*)    APPearance CLOUDY (*)    Nitrite POSITIVE (*)    Leukocytes, UA MODERATE (*)    Bacteria, UA MANY (*)    Squamous Epithelial / LPF 6-30 (*)    All other components within normal limits  CULTURE, BLOOD (ROUTINE X 2)  CULTURE, BLOOD (ROUTINE X 2)  URINE CULTURE  LIPASE, BLOOD  LACTIC ACID, PLASMA  HIV ANTIBODY (ROUTINE TESTING)    Urinalysis is consistent with infection __________________________________________  EKG  ED ECG REPORT I, Darel Hong, the attending physician, personally viewed and interpreted this ECG.  Date: 05/23/2017 EKG Time:  Rate: 107 Rhythm: Sinus tachycardia QRS Axis: normal Intervals: normal ST/T Wave abnormalities: normal Narrative Interpretation:   ________________________________________  RADIOLOGY   ____________________________________________   PROCEDURES  Procedure(s) performed: no  Procedures  Critical Care performed: no  Observation: no ____________________________________________   INITIAL IMPRESSION / ASSESSMENT AND PLAN / ED COURSE  Pertinent labs & imaging results that were available during my care of the patient were reviewed by me and considered in my medical decision making (see chart for details).  The patient arrives extremely uncomfortable. Retching vomiting with low-grade fever and tachycardia. Urinalysis is consistent with infection. I'm concerned that she has pyelonephritis and given the severity of her symptoms will require  intravenous antibiotics intravenous opioids and intravenous antiemetics. I will begin IV hydration and she will require inpatient admission.     ----------------------------------------- 4:47 AM on 05/23/2017 -----------------------------------------  I discussed the case with Dr. Marcille Blanco of the hospitalist service who is graciously agreed to admit the patient to his service. He requests ciprofloxacin instead of ceftriaxone. ____________________________________________   FINAL CLINICAL IMPRESSION(S) / ED DIAGNOSES  Final diagnoses:  Pyelonephritis      NEW MEDICATIONS STARTED DURING THIS VISIT:  New Prescriptions   No medications on file     Note:  This document was prepared using Dragon voice recognition software and may include unintentional dictation errors.     Darel Hong, MD 05/23/17 [phone]

## 2017-05-23 NOTE — ED Notes (Signed)
Pt's o2sat noted to be 88% on RA, placed on 2L via [place]

## 2017-05-23 NOTE — Progress Notes (Signed)
[facility] NAME: [name] Branch    MR#:  [id number]  DATE OF BIRTH:  [birth year]  SUBJECTIVE:  CHIEF COMPLAINT:   Chief Complaint  Patient presents with  . Nausea  . Abdominal Pain   -Admitted with left flank pain, still has refractory nausea and vomiting. -Abdominal pain and flank pain are improving  REVIEW OF SYSTEMS:  Review of Systems  Constitutional: Negative for chills, fever and malaise/fatigue.  HENT: Negative for congestion, ear discharge, hearing loss and nosebleeds.   Eyes: Negative for blurred vision and double vision.  Respiratory: Negative for cough, shortness of breath and wheezing.   Cardiovascular: Negative for chest pain and palpitations.  Gastrointestinal: Positive for abdominal pain, nausea and vomiting. Negative for constipation and diarrhea.  Genitourinary: Negative for dysuria.  Musculoskeletal: Positive for back pain and myalgias.  Neurological: Negative for dizziness, sensory change, speech change, focal weakness, seizures and headaches.  Psychiatric/Behavioral: Negative for depression.    DRUG ALLERGIES:   Allergies  Allergen Reactions  . Sulfonamide Derivatives Nausea And Vomiting    VITALS:  Blood pressure (!) 135/59, pulse 94, temperature 99.8 F (37.7 C), temperature source Oral, resp. rate (!) 27, height 5\' 6"  (1.676 m), weight 91.9 kg (202 lb 8 oz), SpO2 (!) 87 %.  PHYSICAL EXAMINATION:  Physical Exam  GENERAL:  61 y.o.-year-old patient lying in the bed with no acute distress.  EYES: Pupils equal, round, reactive to light and accommodation. No scleral icterus. Extraocular muscles intact.  HEENT: Head atraumatic, normocephalic. Oropharynx and nasopharynx clear.  NECK:  Supple, no jugular venous distention. No thyroid enlargement, no tenderness.  LUNGS: Normal breath sounds bilaterally, no wheezing, rales,rhonchi or crepitation. No use of accessory muscles of respiration.    CARDIOVASCULAR: S1, S2 normal. No murmurs, rubs, or gallops.  ABDOMEN: Soft, nontender, But has some left flank discomfort, nondistended. Bowel sounds present. No organomegaly or mass.  EXTREMITIES: No pedal edema, cyanosis, or clubbing.  NEUROLOGIC: Cranial nerves II through XII are intact. Muscle strength 5/5 in all extremities. Sensation intact. Gait not checked.  PSYCHIATRIC: The patient is alert and oriented x 3.  SKIN: No obvious rash, lesion, or ulcer.    LABORATORY PANEL:   CBC  Recent Labs Lab 05/23/17 0328  WBC 7.9  HGB 12.9  HCT 38.9  PLT 174   ------------------------------------------------------------------------------------------------------------------  Chemistries   Recent Labs Lab 05/23/17 0328  NA 134*  K 3.8  CL 98*  CO2 28  GLUCOSE 125*  BUN 15  CREATININE 0.62  CALCIUM 9.2  AST 44*  ALT 31  ALKPHOS 126  BILITOT 1.3*   ------------------------------------------------------------------------------------------------------------------  Cardiac Enzymes No results for input(s): TROPONINI in the last 168 hours. ------------------------------------------------------------------------------------------------------------------  RADIOLOGY:  No results found.  EKG:   Orders placed or performed during the hospital encounter of 05/23/17  . EKG 12-Lead  . EKG 12-Lead    ASSESSMENT AND PLAN:   61 year old female with past medical history secondary to chronic fatigue syndrome, chronic pain syndrome, pelvic prolapse, IBS, gastritis presents to the hospital secondary to worsening left flank pain, nausea and vomiting. Noted to have acute cystitis and possible left pyelonephritis.  #1 acute cystitis-possible left file or nephritis with symptoms -CT of the abdomen not done this admission. -UA strongly positive for infection, urine cultures and blood cultures are pending. -Change antibiotics to Rocephin. -Fluids, nausea medications  #2 chronic pain  syndrome-started on MS Contin. Also on IV morphine as needed here.  #  3 chronic constipation-also history of rectal prolapse. Continue laxatives.  #4 DVT prophylaxis-Lovenox   All the records are reviewed and case discussed with Care Management/Social Workerr. Management plans discussed with the patient, family and they are in agreement.  CODE STATUS: Full code  TOTAL TIME TAKING CARE OF THIS PATIENT: 36 minutes.   POSSIBLE D/C IN 1-2 DAYS, DEPENDING ON CLINICAL CONDITION.   Aireanna Luellen M.D on 05/23/2017 at 12:24 PM  Between 7am to 6pm - Pager - [phone]  After 6pm go to [email or web address] - password [email or web address] [facility]  Office  [phone]  CC: Primary care physician; Tower, Wynelle Fanny, MD

## 2017-05-23 NOTE — Progress Notes (Signed)
Pharmacy Antibiotic Note  [name] Branch is a 61 y.o. female admitted on 05/23/2017 with pyelonephritis.  Pharmacy has been consulted for ciprofloxacin dosing.  Plan: Ciprofloxacin 400 mg IV q12h  Height: 5\' 6"  (167.6 cm) Weight: 202 lb 8 oz (91.9 kg) IBW/kg (Calculated) : 59.3  Temp (24hrs), Avg:99.9 F (37.7 C), Min:99.8 F (37.7 C), Max:99.9 F (37.7 C)   Recent Labs Lab 05/23/17 0328 05/23/17 0455  WBC 7.9  --   CREATININE 0.62  --   LATICACIDVEN  --  0.9    Estimated Creatinine Clearance: 84.3 mL/min (by C-G formula based on SCr of 0.62 mg/dL).    Allergies  Allergen Reactions  . Sulfonamide Derivatives Nausea And Vomiting    Antimicrobials this admission: ciprofloxacin 7/29 >>   Dose adjustments this admission:  Microbiology results: 7/29 BCx: No growth < 12 hours 7/29 UCx: Sent   Thank you for allowing pharmacy to be a part of this patient's care.  Lenis Noon, PharmD Clinical Pharmacist 05/23/2017 8:53 AM

## 2017-05-23 NOTE — H&P (Signed)
[name] Branch is an 61 y.o. female.   Chief Complaint: Abdominal pain HPI: The patient with past medical history of recurrent urinary tract infection secondary to pelvic prolapse as well as history of chronic constipation and chronic fatigue syndrome possibly related to tick borne illness presents to the emergency department complaining of left-sided back pain radiating into her flank. The patient has been nauseous and vomiting all day. Emesis has been nonbloody nonbilious. The patient has not had fevers. CT of the abdomen revealed evidence of pyelonephritis. Blood cultures were obtained prior to initiation of antibiotics and the urgency department staff called the hospitalist service for admission.  Past Medical History:  Diagnosis Date  . Allergic rhinitis   . 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 Dr. Neldon Mc   . Chronic gastritis   . Hepatic steatosis   . Hyperlipidemia   . IBS (irritable bowel syndrome)   . Lyme disease    "stari"  . Migraine   . Narcolepsy   . Rectal prolapse   . Serrated adenoma of colon   . Umbilical hernia   . Vaginal prolapse   . West Nile Virus infection 2003    Past Surgical History:  Procedure Laterality Date  . 5th R toe fx  '09  . BREAST BIOPSY  '04   neg; right  . CHOLECYSTECTOMY  10/2011  . laprascopy    . PARTIAL HYSTERECTOMY     bleeding  . TONSILLECTOMY    . ULNAR NERVE REPAIR     sx times 5  . UPPER GASTROINTESTINAL ENDOSCOPY  12/04/2004   gastritis    Family History  Problem Relation Age of Onset  . Diabetes Father   . Hypertension Father   . Stroke Father   . Hypertension Mother   . Cancer Mother        Darrick Grinder  . Ulcers Mother   . Breast cancer Paternal Grandmother   . Bipolar disorder Unknown        some family  . Alcohol abuse Maternal Grandfather   . Colon cancer Neg Hx    Social History:  reports that she has never smoked. She has never used smokeless  tobacco. She reports that she does not drink alcohol or use drugs.  Allergies:  Allergies  Allergen Reactions  . Sulfonamide Derivatives Nausea And Vomiting    Prior to Admission medications   Medication Sig Start Date End Date Taking? Authorizing Provider  morphine (MSIR) 30 MG tablet Take 30 mg by mouth every 3 (three) hours as needed for severe pain. UAD PRN   Yes [provider]  promethazine (PHENERGAN) 25 MG tablet Take 1 tablet (25 mg total) by mouth every 6 (six) hours as needed for nausea. 01/19/13  Yes Bedsole, Amy E, MD     Results for orders placed or performed during the hospital encounter of 05/23/17 (from the past 48 hour(s))  Lipase, blood     Status: None   Collection Time: 05/23/17  3:28 AM  Result Value Ref Range   Lipase 23 11 - 51 U/L  Comprehensive metabolic panel     Status: Abnormal   Collection Time: 05/23/17  3:28 AM  Result Value Ref Range   Sodium 134 (L) 135 - 145 mmol/L   Potassium 3.8 3.5 - 5.1 mmol/L   Chloride 98 (L) 101 - 111 mmol/L   CO2 28 22 - 32 mmol/L   Glucose, Bld 125 (H) 65 -  99 mg/dL   BUN 15 6 - 20 mg/dL   Creatinine, Ser 0.62 0.44 - 1.00 mg/dL   Calcium 9.2 8.9 - 10.3 mg/dL   Total Protein 8.4 (H) 6.5 - 8.1 g/dL   Albumin 4.1 3.5 - 5.0 g/dL   AST 44 (H) 15 - 41 U/L   ALT 31 14 - 54 U/L   Alkaline Phosphatase 126 38 - 126 U/L   Total Bilirubin 1.3 (H) 0.3 - 1.2 mg/dL   GFR calc non Af Amer >60 >60 mL/min   GFR calc Af Amer >60 >60 mL/min    Comment: (NOTE) The eGFR has been calculated using the CKD EPI equation. This calculation has not been validated in all clinical situations. eGFR's persistently <60 mL/min signify possible Chronic Kidney Disease.    Anion gap 8 5 - 15  CBC     Status: Abnormal   Collection Time: 05/23/17  3:28 AM  Result Value Ref Range   WBC 7.9 3.6 - 11.0 K/uL   RBC 4.78 3.80 - 5.20 MIL/uL   Hemoglobin 12.9 12.0 - 16.0 g/dL   HCT 38.9 35.0 - 47.0 %   MCV 81.4 80.0 - 100.0 fL   MCH 26.9 26.0  - 34.0 pg   MCHC 33.1 32.0 - 36.0 g/dL   RDW 14.7 (H) 11.5 - 14.5 %   Platelets 174 150 - 440 K/uL  Urinalysis, Complete w Microscopic     Status: Abnormal   Collection Time: 05/23/17  3:29 AM  Result Value Ref Range   Color, Urine AMBER (A) YELLOW    Comment: BIOCHEMICALS MAY BE AFFECTED BY COLOR   APPearance CLOUDY (A) CLEAR   Specific Gravity, Urine 1.014 1.005 - 1.030   pH 7.0 5.0 - 8.0   Glucose, UA NEGATIVE NEGATIVE mg/dL   Hgb urine dipstick NEGATIVE NEGATIVE   Bilirubin Urine NEGATIVE NEGATIVE   Ketones, ur NEGATIVE NEGATIVE mg/dL   Protein, ur NEGATIVE NEGATIVE mg/dL   Nitrite POSITIVE (A) NEGATIVE   Leukocytes, UA MODERATE (A) NEGATIVE   RBC / HPF 6-30 0 - 5 RBC/hpf   WBC, UA TOO NUMEROUS TO COUNT 0 - 5 WBC/hpf   Bacteria, UA MANY (A) NONE SEEN   Squamous Epithelial / LPF 6-30 (A) NONE SEEN   WBC Clumps PRESENT   Lactic acid, plasma     Status: None   Collection Time: 05/23/17  4:55 AM  Result Value Ref Range   Lactic Acid, Venous 0.9 0.5 - 1.9 mmol/L   No results found.  Review of Systems  Constitutional: Negative for chills and fever.  HENT: Negative for sore throat and tinnitus.   Eyes: Negative for blurred vision and redness.  Respiratory: Negative for cough and shortness of breath.   Cardiovascular: Negative for chest pain, palpitations, orthopnea and PND.  Gastrointestinal: Positive for abdominal pain, constipation, nausea and vomiting. Negative for diarrhea.  Genitourinary: Negative for dysuria, frequency and urgency.  Musculoskeletal: Positive for back pain. Negative for joint pain and myalgias.  Skin: Negative for rash.       No lesions  Neurological: Negative for speech change, focal weakness and weakness.  Endo/Heme/Allergies: Does not bruise/bleed easily.       No temperature intolerance  Psychiatric/Behavioral: Negative for depression and suicidal ideas.    Blood pressure 139/71, pulse 96, temperature 99.9 F (37.7 C), resp. rate 17, height 5'  6" (1.676 m), weight 86.2 kg (190 lb), SpO2 93 %. Physical Exam  Vitals reviewed. Constitutional: She is oriented to person,  place, and time. She appears well-developed and well-nourished. No distress.  HENT:  Head: Normocephalic and atraumatic.  Eyes: Pupils are equal, round, and reactive to light. Conjunctivae and EOM are normal. No scleral icterus.  Neck: Normal range of motion. Neck supple. No JVD present. No tracheal deviation present. No thyromegaly present.  Cardiovascular: Normal rate, regular rhythm and normal heart sounds.  Exam reveals no gallop and no friction rub.   No murmur heard. Respiratory: Effort normal and breath sounds normal. No respiratory distress.  GI: Soft. Bowel sounds are normal. She exhibits no distension. There is no tenderness.  Genitourinary:  Genitourinary Comments: Deferred  Musculoskeletal: Normal range of motion. She exhibits no edema.  Lymphadenopathy:    She has no cervical adenopathy.  Neurological: She is alert and oriented to person, place, and time. No cranial nerve deficit. She exhibits normal muscle tone.  Skin: Skin is warm and dry. No rash noted. No erythema.  Psychiatric: She has a normal mood and affect. Her behavior is normal. Judgment and thought content normal.     Assessment/Plan This is a 61 year old female admitted for pyelonephritis 1. Pyelonephritis: The patient does not have signs or symptoms of sepsis however blood cultures have been obtained and the patient given adequate fluid resuscitation. Continue Cipro IV. 2. Chronic pain syndrome: Contribute to constipation. The patient had been on morphine immediate release 30 mg every 3 hours when necessary. I have altered her narcotic regimen to include long-acting morphine 15 mg twice a day. Oxycodone acetaminophen 5 mg-325 mg every 6 hours when necessary moderate to severe pain. She will use her immediate release morphine for breakthrough pain. 3. IBS: Emphasized good bowel regimen. I  placed patient on a stimulant laxative. Continue Linzess 4. Anorectal prolapse: With uterovaginal prolapse; predisposing patient to recurrent urinary tract infection. Consider urogynecology consultation 5. DVT prophylaxis: Lovenox 6. GI prophylaxis: None The patient is a full code. Time spent on admission was inpatient care approximately 45 minutes  Harrie Foreman, MD 05/23/2017, 7:19 AM

## 2017-05-23 NOTE — ED Notes (Signed)
EMS pt from home with c/o right flank pain for 60-90 minutes; took her own Morphine which is prescribed to her before calling EMS but says it hasn't helped yet; c/o nausea; 20 gauge SL to [place]; pt given Zofran 4mg  IV for nausea

## 2017-05-23 NOTE — Progress Notes (Signed)
Pt has on red (allergies) and yellow (fall) armbands 

## 2017-05-23 NOTE — Progress Notes (Signed)
PHARMACY - PHYSICIAN COMMUNICATION CRITICAL VALUE ALERT - BLOOD CULTURE IDENTIFICATION (BCID)  Results for orders placed or performed during the hospital encounter of 05/23/17  Blood Culture ID Panel (Reflexed) (Collected: 05/23/2017  4:55 AM)  Result Value Ref Range   Enterococcus species NOT DETECTED NOT DETECTED   Listeria monocytogenes NOT DETECTED NOT DETECTED   Staphylococcus species NOT DETECTED NOT DETECTED   Staphylococcus aureus NOT DETECTED NOT DETECTED   Streptococcus species DETECTED (A) NOT DETECTED   Streptococcus agalactiae DETECTED (A) NOT DETECTED   Streptococcus pneumoniae NOT DETECTED NOT DETECTED   Streptococcus pyogenes NOT DETECTED NOT DETECTED   Acinetobacter baumannii NOT DETECTED NOT DETECTED   Enterobacteriaceae species NOT DETECTED NOT DETECTED   Enterobacter cloacae complex NOT DETECTED NOT DETECTED   Escherichia coli NOT DETECTED NOT DETECTED   Klebsiella oxytoca NOT DETECTED NOT DETECTED   Klebsiella pneumoniae NOT DETECTED NOT DETECTED   Proteus species NOT DETECTED NOT DETECTED   Serratia marcescens NOT DETECTED NOT DETECTED   Haemophilus influenzae NOT DETECTED NOT DETECTED   Neisseria meningitidis NOT DETECTED NOT DETECTED   Pseudomonas aeruginosa NOT DETECTED NOT DETECTED   Candida albicans NOT DETECTED NOT DETECTED   Candida glabrata NOT DETECTED NOT DETECTED   Candida krusei NOT DETECTED NOT DETECTED   Candida parapsilosis NOT DETECTED NOT DETECTED   Candida tropicalis NOT DETECTED NOT DETECTED    Name of physician (or Provider) Contacted:  Kalisetti  Changes to prescribed antibiotics required:  Yes, will increase ceftriaxone dose to 2 gm.   Fawnda Vitullo D 05/23/2017  5:35 PM

## 2017-05-23 NOTE — Progress Notes (Signed)
Pharmacy Antibiotic Note  [name] Branch is a 61 y.o. female admitted on 05/23/2017 with UTI.  Pharmacy has been consulted for ceftriaxone dosing.  Antibiotics being changed from ciprofloxacin to ceftriaxone  Plan: Ceftriaxone 1 g IV daily  Height: 5\' 6"  (167.6 cm) Weight: 202 lb 8 oz (91.9 kg) IBW/kg (Calculated) : 59.3  Temp (24hrs), Avg:99.9 F (37.7 C), Min:99.8 F (37.7 C), Max:99.9 F (37.7 C)   Recent Labs Lab 05/23/17 0328 05/23/17 0455  WBC 7.9  --   CREATININE 0.62  --   LATICACIDVEN  --  0.9    Estimated Creatinine Clearance: 84.3 mL/min (by C-G formula based on SCr of 0.62 mg/dL).    Allergies  Allergen Reactions  . Sulfonamide Derivatives Nausea And Vomiting   Antimicrobials this admission: ceftriaxone 7/29 >>  ciprofloxacin 7/29  Dose adjustments this admission:  Microbiology results: 7/29 BCx: Pending 7/29 UCx: Sent   Thank you for allowing pharmacy to be a part of this patient's care.  Lenis Noon, PharmD Clinical Pharmacist 05/23/2017 10:21 AM

## 2017-05-23 NOTE — ED Triage Notes (Signed)
Patient with complaint of right side abdominal pain and nausea that started about an hour prior to arrival. Patient was given zofran by ems.

## 2017-05-24 ENCOUNTER — Observation Stay: Payer: 59

## 2017-05-24 DIAGNOSIS — N132 Hydronephrosis with renal and ureteral calculous obstruction: Secondary | ICD-10-CM | POA: Diagnosis not present

## 2017-05-24 DIAGNOSIS — J810 Acute pulmonary edema: Secondary | ICD-10-CM | POA: Diagnosis present

## 2017-05-24 DIAGNOSIS — Z882 Allergy status to sulfonamides status: Secondary | ICD-10-CM | POA: Diagnosis not present

## 2017-05-24 DIAGNOSIS — K589 Irritable bowel syndrome without diarrhea: Secondary | ICD-10-CM | POA: Diagnosis present

## 2017-05-24 DIAGNOSIS — R5382 Chronic fatigue, unspecified: Secondary | ICD-10-CM | POA: Diagnosis present

## 2017-05-24 DIAGNOSIS — R52 Pain, unspecified: Secondary | ICD-10-CM | POA: Diagnosis not present

## 2017-05-24 DIAGNOSIS — N10 Acute tubulo-interstitial nephritis: Secondary | ICD-10-CM | POA: Diagnosis not present

## 2017-05-24 DIAGNOSIS — Z9049 Acquired absence of other specified parts of digestive tract: Secondary | ICD-10-CM | POA: Diagnosis not present

## 2017-05-24 DIAGNOSIS — A401 Sepsis due to streptococcus, group B: Secondary | ICD-10-CM | POA: Diagnosis not present

## 2017-05-24 DIAGNOSIS — Z803 Family history of malignant neoplasm of breast: Secondary | ICD-10-CM | POA: Diagnosis not present

## 2017-05-24 DIAGNOSIS — Z823 Family history of stroke: Secondary | ICD-10-CM | POA: Diagnosis not present

## 2017-05-24 DIAGNOSIS — K5909 Other constipation: Secondary | ICD-10-CM | POA: Diagnosis present

## 2017-05-24 DIAGNOSIS — G47419 Narcolepsy without cataplexy: Secondary | ICD-10-CM | POA: Diagnosis present

## 2017-05-24 DIAGNOSIS — A419 Sepsis, unspecified organism: Secondary | ICD-10-CM | POA: Diagnosis not present

## 2017-05-24 DIAGNOSIS — Z87442 Personal history of urinary calculi: Secondary | ICD-10-CM | POA: Diagnosis not present

## 2017-05-24 DIAGNOSIS — R7881 Bacteremia: Secondary | ICD-10-CM | POA: Diagnosis not present

## 2017-05-24 DIAGNOSIS — N814 Uterovaginal prolapse, unspecified: Secondary | ICD-10-CM | POA: Diagnosis present

## 2017-05-24 DIAGNOSIS — N202 Calculus of kidney with calculus of ureter: Secondary | ICD-10-CM | POA: Diagnosis present

## 2017-05-24 DIAGNOSIS — Z90711 Acquired absence of uterus with remaining cervical stump: Secondary | ICD-10-CM | POA: Diagnosis not present

## 2017-05-24 DIAGNOSIS — K6289 Other specified diseases of anus and rectum: Secondary | ICD-10-CM | POA: Diagnosis not present

## 2017-05-24 DIAGNOSIS — Z8249 Family history of ischemic heart disease and other diseases of the circulatory system: Secondary | ICD-10-CM | POA: Diagnosis not present

## 2017-05-24 DIAGNOSIS — Z833 Family history of diabetes mellitus: Secondary | ICD-10-CM | POA: Diagnosis not present

## 2017-05-24 DIAGNOSIS — N3000 Acute cystitis without hematuria: Secondary | ICD-10-CM | POA: Diagnosis present

## 2017-05-24 DIAGNOSIS — R1111 Vomiting without nausea: Secondary | ICD-10-CM | POA: Diagnosis not present

## 2017-05-24 DIAGNOSIS — Z9889 Other specified postprocedural states: Secondary | ICD-10-CM | POA: Diagnosis not present

## 2017-05-24 DIAGNOSIS — B951 Streptococcus, group B, as the cause of diseases classified elsewhere: Secondary | ICD-10-CM | POA: Diagnosis present

## 2017-05-24 DIAGNOSIS — Z90710 Acquired absence of both cervix and uterus: Secondary | ICD-10-CM | POA: Diagnosis not present

## 2017-05-24 DIAGNOSIS — R05 Cough: Secondary | ICD-10-CM | POA: Diagnosis not present

## 2017-05-24 DIAGNOSIS — Z87440 Personal history of urinary (tract) infections: Secondary | ICD-10-CM | POA: Diagnosis not present

## 2017-05-24 DIAGNOSIS — R109 Unspecified abdominal pain: Secondary | ICD-10-CM | POA: Diagnosis not present

## 2017-05-24 DIAGNOSIS — N12 Tubulo-interstitial nephritis, not specified as acute or chronic: Secondary | ICD-10-CM | POA: Diagnosis not present

## 2017-05-24 DIAGNOSIS — N201 Calculus of ureter: Secondary | ICD-10-CM | POA: Diagnosis not present

## 2017-05-24 DIAGNOSIS — N390 Urinary tract infection, site not specified: Secondary | ICD-10-CM | POA: Diagnosis not present

## 2017-05-24 DIAGNOSIS — R0602 Shortness of breath: Secondary | ICD-10-CM | POA: Diagnosis not present

## 2017-05-24 DIAGNOSIS — J449 Chronic obstructive pulmonary disease, unspecified: Secondary | ICD-10-CM | POA: Diagnosis present

## 2017-05-24 DIAGNOSIS — R0902 Hypoxemia: Secondary | ICD-10-CM | POA: Diagnosis present

## 2017-05-24 DIAGNOSIS — G894 Chronic pain syndrome: Secondary | ICD-10-CM | POA: Diagnosis present

## 2017-05-24 LAB — HEMOGLOBIN A1C
Hgb A1c MFr Bld: 5.7 % — ABNORMAL HIGH (ref 4.8–5.6)
Mean Plasma Glucose: 117 mg/dL

## 2017-05-24 LAB — CBC
HEMATOCRIT: 33.9 % — AB (ref 35.0–47.0)
HEMOGLOBIN: 11.3 g/dL — AB (ref 12.0–16.0)
MCH: 27.1 pg (ref 26.0–34.0)
MCHC: 33.2 g/dL (ref 32.0–36.0)
MCV: 81.5 fL (ref 80.0–100.0)
Platelets: 133 10*3/uL — ABNORMAL LOW (ref 150–440)
RBC: 4.17 MIL/uL (ref 3.80–5.20)
RDW: 14.6 % — ABNORMAL HIGH (ref 11.5–14.5)
WBC: 10.8 10*3/uL (ref 3.6–11.0)

## 2017-05-24 LAB — BASIC METABOLIC PANEL
Anion gap: 6 (ref 5–15)
BUN: 16 mg/dL (ref 6–20)
CHLORIDE: 100 mmol/L — AB (ref 101–111)
CO2: 26 mmol/L (ref 22–32)
CREATININE: 1.05 mg/dL — AB (ref 0.44–1.00)
Calcium: 8.3 mg/dL — ABNORMAL LOW (ref 8.9–10.3)
GFR calc non Af Amer: 56 mL/min — ABNORMAL LOW (ref >60)
Glucose, Bld: 116 mg/dL — ABNORMAL HIGH (ref 65–99)
POTASSIUM: 3.6 mmol/L (ref 3.5–5.1)
Sodium: 132 mmol/L — ABNORMAL LOW (ref 135–145)

## 2017-05-24 LAB — URINE CULTURE

## 2017-05-24 MED ORDER — DOCUSATE SODIUM 100 MG PO CAPS
100.0000 mg | ORAL_CAPSULE | Freq: Two times a day (BID) | ORAL | Status: DC
  Administered 2017-05-24 – 2017-05-28 (×9): 100 mg via ORAL
  Filled 2017-05-24 (×9): qty 1

## 2017-05-24 NOTE — Progress Notes (Signed)
Pharmacy Antibiotic Note  [name] Branch is a 61 y.o. female admitted on 05/23/2017 with UTI.  Pharmacy has been consulted for ceftriaxone dosing.  Antibiotics being changed from ciprofloxacin to ceftriaxone  Plan: Ceftriaxone 2 g IV daily  Height: 5\' 6"  (167.6 cm) Weight: 202 lb 8 oz (91.9 kg) IBW/kg (Calculated) : 59.3  Temp (24hrs), Avg:100 F (37.8 C), Min:98.1 F (36.7 C), Max:102.9 F (39.4 C)   Recent Labs Lab 05/23/17 0328 05/23/17 0455 05/24/17 0435  WBC 7.9  --  10.8  CREATININE 0.62  --  1.05*  LATICACIDVEN  --  0.9  --     Estimated Creatinine Clearance: 64.2 mL/min (A) (by C-G formula based on SCr of 1.05 mg/dL (H)).    Allergies  Allergen Reactions  . Sulfonamide Derivatives Nausea And Vomiting   Antimicrobials this admission: ceftriaxone 7/29 >>  ciprofloxacin 7/29  Dose adjustments this admission: Changed 1g to 2 g daily on 7/29  Microbiology results: 7/29 BCx: GPC 2/2 7/29 UCx: Sent   Thank you for allowing pharmacy to be a part of this patient's care.  Rocky Morel, PharmD Clinical Pharmacist 05/24/2017 9:31 AM

## 2017-05-24 NOTE — Progress Notes (Signed)
[facility] [place] NAME: [name] Branch    MR#:  [id number]  DATE OF BIRTH:  07-Jan-1956  SUBJECTIVE:  CHIEF COMPLAINT:   Chief Complaint  Patient presents with  . Nausea  . Abdominal Pain   -Continues to have fevers overnight. Complains of headache and generalized body aches. -Also having intermittent flank pain  REVIEW OF SYSTEMS:  Review of Systems  Constitutional: Positive for fever. Negative for chills and malaise/fatigue.  HENT: Negative for congestion, ear discharge, hearing loss and nosebleeds.   Eyes: Negative for blurred vision and double vision.  Respiratory: Negative for cough, shortness of breath and wheezing.   Cardiovascular: Negative for chest pain and palpitations.  Gastrointestinal: Positive for abdominal pain, nausea and vomiting. Negative for constipation and diarrhea.  Genitourinary: Negative for dysuria.  Musculoskeletal: Positive for back pain and myalgias.  Neurological: Negative for dizziness, sensory change, speech change, focal weakness, seizures and headaches.  Psychiatric/Behavioral: Negative for depression.    DRUG ALLERGIES:   Allergies  Allergen Reactions  . Sulfonamide Derivatives Nausea And Vomiting    VITALS:  Blood pressure (!) 126/50, pulse (!) 103, temperature 98.1 F (36.7 C), temperature source Oral, resp. rate 18, height 5\' 6"  (1.676 m), weight 91.9 kg (202 lb 8 oz), SpO2 93 %.  PHYSICAL EXAMINATION:  Physical Exam  GENERAL:  61 y.o.-year-old patient lying in the bed, Appears miserable due to pain all over EYES: Pupils equal, round, reactive to light and accommodation. No scleral icterus. Extraocular muscles intact.  HEENT: Head atraumatic, normocephalic. Oropharynx and nasopharynx clear.  NECK:  Supple, no jugular venous distention. No thyroid enlargement, no tenderness.  LUNGS: Normal breath sounds bilaterally, no wheezing, rales,rhonchi or crepitation. No use of accessory  muscles of respiration.  CARDIOVASCULAR: S1, S2 normal. No murmurs, rubs, or gallops.  ABDOMEN: Soft, nontender, But has some left flank discomfort, nondistended. Bowel sounds present. No organomegaly or mass.  EXTREMITIES: No pedal edema, cyanosis, or clubbing.  NEUROLOGIC: Cranial nerves II through XII are intact. Muscle strength 5/5 in all extremities. Sensation intact. Gait not checked.  PSYCHIATRIC: The patient is alert and oriented x 3.  SKIN: No obvious rash, lesion, or ulcer.    LABORATORY PANEL:   CBC  Recent Labs Lab 05/24/17 0435  WBC 10.8  HGB 11.3*  HCT 33.9*  PLT 133*   ------------------------------------------------------------------------------------------------------------------  Chemistries   Recent Labs Lab 05/23/17 0328 05/24/17 0435  NA 134* 132*  K 3.8 3.6  CL 98* 100*  CO2 28 26  GLUCOSE 125* 116*  BUN 15 16  CREATININE 0.62 1.05*  CALCIUM 9.2 8.3*  AST 44*  --   ALT 31  --   ALKPHOS 126  --   BILITOT 1.3*  --    ------------------------------------------------------------------------------------------------------------------  Cardiac Enzymes No results for input(s): TROPONINI in the last 168 hours. ------------------------------------------------------------------------------------------------------------------  RADIOLOGY:  No results found.  EKG:   Orders placed or performed during the hospital encounter of 05/23/17  . EKG 12-Lead  . EKG 12-Lead    ASSESSMENT AND PLAN:   61 year old female with past medical history secondary to chronic fatigue syndrome, chronic pain syndrome, pelvic prolapse, IBS, gastritis presents to the hospital secondary to worsening left flank pain, nausea and vomiting. Noted to have acute cystitis and possible left pyelonephritis.  #1 acute cystitis-possible left pyelonephritis -Due to persistent fevers, we'll get a renal ultrasound to rule out any renal abscess -CT of the abdomen not done this  admission. -UA  strongly positive for infection, urine cultures and blood cultures are pending. -Blood cultures growing Streptococcus species -Continue antibiotics - Rocephin. - On IV fluids  #2 chronic pain syndrome-started on MS Contin. Also on IV morphine as needed here.  #3 chronic constipation-also history of rectal prolapse. Continue laxatives.  #4 DVT prophylaxis-Lovenox   Husband updated at bedside  All the records are reviewed and case discussed with Care Management/Social Workerr. Management plans discussed with the patient, family and they are in agreement.  CODE STATUS: Full code  TOTAL TIME TAKING CARE OF THIS PATIENT: 37 minutes.   POSSIBLE D/C IN 1-2 DAYS, DEPENDING ON CLINICAL CONDITION.   [name] Branch M.D on 05/24/2017 at 9:14 AM  Between 7am to 6pm - Pager - [phone]  After 6pm go to [email or web address] - password [email or web address] [facility]  Office  [phone]  CC: Primary care physician; Tower, Wynelle Fanny, MD

## 2017-05-24 NOTE — Plan of Care (Signed)
Problem: Pain Managment: Goal: General experience of comfort will improve Outcome: Not Progressing Pt continues to complain of bilateral flank pain. When recheck of pain is done, pt is sleeping soundly.

## 2017-05-25 ENCOUNTER — Inpatient Hospital Stay (HOSPITAL_COMMUNITY)
Admit: 2017-05-25 | Discharge: 2017-05-25 | Disposition: A | Discharge status: Home / Self Care | Payer: 59 | Attending: Internal Medicine | Admitting: Internal Medicine

## 2017-05-25 ENCOUNTER — Inpatient Hospital Stay: Payer: 59

## 2017-05-25 ENCOUNTER — Inpatient Hospital Stay: Payer: 59 | Admitting: Anesthesiology

## 2017-05-25 ENCOUNTER — Encounter
Admission: EM | Disposition: A | Discharge status: Home / Self Care | Payer: Self-pay | Source: Home / Self Care | Attending: Internal Medicine

## 2017-05-25 ENCOUNTER — Encounter: Payer: Self-pay | Admitting: Urology

## 2017-05-25 DIAGNOSIS — N201 Calculus of ureter: Secondary | ICD-10-CM

## 2017-05-25 DIAGNOSIS — R7881 Bacteremia: Secondary | ICD-10-CM

## 2017-05-25 DIAGNOSIS — N132 Hydronephrosis with renal and ureteral calculous obstruction: Secondary | ICD-10-CM

## 2017-05-25 HISTORY — PX: CYSTOSCOPY WITH STENT PLACEMENT: SHX5790

## 2017-05-25 LAB — COMPREHENSIVE METABOLIC PANEL
ALBUMIN: 3.3 g/dL — AB (ref 3.5–5.0)
ALT: 24 U/L (ref 14–54)
ANION GAP: 7 (ref 5–15)
AST: 36 U/L (ref 15–41)
Alkaline Phosphatase: 89 U/L (ref 38–126)
BILIRUBIN TOTAL: 0.8 mg/dL (ref 0.3–1.2)
BUN: 15 mg/dL (ref 6–20)
CALCIUM: 8.3 mg/dL — AB (ref 8.9–10.3)
CO2: 26 mmol/L (ref 22–32)
CREATININE: 0.92 mg/dL (ref 0.44–1.00)
Chloride: 101 mmol/L (ref 101–111)
GFR calc non Af Amer: >60 mL/min (ref >60)
GLUCOSE: 128 mg/dL — AB (ref 65–99)
POTASSIUM: 3.9 mmol/L (ref 3.5–5.1)
SODIUM: 134 mmol/L — AB (ref 135–145)
TOTAL PROTEIN: 7.5 g/dL (ref 6.5–8.1)

## 2017-05-25 LAB — CULTURE, BLOOD (ROUTINE X 2)
SPECIAL REQUESTS: ADEQUATE
Special Requests: ADEQUATE

## 2017-05-25 LAB — HIV ANTIBODY (ROUTINE TESTING W REFLEX): HIV Screen 4th Generation wRfx: NONREACTIVE

## 2017-05-25 SURGERY — CYSTOSCOPY, WITH STENT INSERTION
Anesthesia: General | Laterality: Right | Wound class: Contaminated

## 2017-05-25 MED ORDER — PROPOFOL 10 MG/ML IV BOLUS
INTRAVENOUS | Status: AC
  Filled 2017-05-25: qty 20

## 2017-05-25 MED ORDER — MODAFINIL 100 MG PO TABS
100.0000 mg | ORAL_TABLET | Freq: Every day | ORAL | Status: DC
  Administered 2017-05-26 – 2017-05-28 (×3): 100 mg via ORAL
  Filled 2017-05-25 (×3): qty 1

## 2017-05-25 MED ORDER — ROCURONIUM BROMIDE 50 MG/5ML IV SOLN
INTRAVENOUS | Status: AC
  Filled 2017-05-25: qty 1

## 2017-05-25 MED ORDER — IPRATROPIUM-ALBUTEROL 0.5-2.5 (3) MG/3ML IN SOLN
RESPIRATORY_TRACT | Status: AC
  Administered 2017-05-25: 3 mL via RESPIRATORY_TRACT
  Filled 2017-05-25: qty 3

## 2017-05-25 MED ORDER — MEPERIDINE HCL 50 MG/ML IJ SOLN: 6.2500 mg | INTRAMUSCULAR | Status: DC | PRN

## 2017-05-25 MED ORDER — IBUPROFEN 400 MG PO TABS
400.0000 mg | ORAL_TABLET | ORAL | Status: AC
  Administered 2017-05-25: 400 mg via ORAL
  Filled 2017-05-25: qty 1

## 2017-05-25 MED ORDER — OXYCODONE HCL 5 MG/5ML PO SOLN: 5.0000 mg | Freq: Once | ORAL | Status: DC | PRN

## 2017-05-25 MED ORDER — FENTANYL CITRATE (PF) 100 MCG/2ML IJ SOLN: 25.0000 ug | INTRAMUSCULAR | Status: DC | PRN

## 2017-05-25 MED ORDER — SODIUM CHLORIDE 0.9 % IV SOLN
INTRAVENOUS | Status: DC | PRN
  Administered 2017-05-25: 80 mg via INTRAVENOUS

## 2017-05-25 MED ORDER — PHENYLEPHRINE HCL 10 MG/ML IJ SOLN
INTRAMUSCULAR | Status: DC | PRN
  Administered 2017-05-25 (×2): 100 ug via INTRAVENOUS

## 2017-05-25 MED ORDER — ONDANSETRON HCL 4 MG/2ML IJ SOLN
INTRAMUSCULAR | Status: AC
  Filled 2017-05-25: qty 2

## 2017-05-25 MED ORDER — OXYCODONE HCL 5 MG PO TABS: 5.0000 mg | ORAL_TABLET | Freq: Once | ORAL | Status: DC | PRN

## 2017-05-25 MED ORDER — IOTHALAMATE MEGLUMINE 43 % IV SOLN
INTRAVENOUS | Status: DC | PRN
  Administered 2017-05-25: 20 mL via SURGICAL_CAVITY

## 2017-05-25 MED ORDER — PROPOFOL 10 MG/ML IV BOLUS
INTRAVENOUS | Status: DC | PRN
  Administered 2017-05-25: 130 mg via INTRAVENOUS

## 2017-05-25 MED ORDER — GENTAMICIN SULFATE 40 MG/ML IJ SOLN
INTRAMUSCULAR | Status: AC
  Filled 2017-05-25: qty 2

## 2017-05-25 MED ORDER — ONDANSETRON HCL 4 MG/2ML IJ SOLN
INTRAMUSCULAR | Status: DC | PRN
Start: 2017-05-25 — End: 2017-05-25
  Administered 2017-05-25: 4 mg via INTRAVENOUS

## 2017-05-25 MED ORDER — FENTANYL CITRATE (PF) 100 MCG/2ML IJ SOLN
INTRAMUSCULAR | Status: DC | PRN
  Administered 2017-05-25 (×2): 50 ug via INTRAVENOUS

## 2017-05-25 MED ORDER — DEXAMETHASONE SODIUM PHOSPHATE 10 MG/ML IJ SOLN
INTRAMUSCULAR | Status: DC | PRN
  Administered 2017-05-25: 10 mg via INTRAVENOUS

## 2017-05-25 MED ORDER — SUCCINYLCHOLINE CHLORIDE 20 MG/ML IJ SOLN
INTRAMUSCULAR | Status: DC | PRN
  Administered 2017-05-25: 100 mg via INTRAVENOUS

## 2017-05-25 MED ORDER — DEXAMETHASONE SODIUM PHOSPHATE 10 MG/ML IJ SOLN
INTRAMUSCULAR | Status: AC
  Filled 2017-05-25: qty 1

## 2017-05-25 MED ORDER — LACTATED RINGERS IV SOLN
INTRAVENOUS | Status: DC | PRN
  Administered 2017-05-25: 13:00:00 via INTRAVENOUS

## 2017-05-25 MED ORDER — FENTANYL CITRATE (PF) 100 MCG/2ML IJ SOLN
INTRAMUSCULAR | Status: AC
  Filled 2017-05-25: qty 2

## 2017-05-25 MED ORDER — SUGAMMADEX SODIUM 500 MG/5ML IV SOLN
INTRAVENOUS | Status: DC | PRN
  Administered 2017-05-25: 200 mg via INTRAVENOUS

## 2017-05-25 MED ORDER — ROCURONIUM BROMIDE 100 MG/10ML IV SOLN
INTRAVENOUS | Status: DC | PRN
  Administered 2017-05-25: 20 mg via INTRAVENOUS

## 2017-05-25 MED ORDER — PROMETHAZINE HCL 25 MG/ML IJ SOLN: 6.2500 mg | INTRAMUSCULAR | Status: DC | PRN

## 2017-05-25 MED ORDER — IPRATROPIUM-ALBUTEROL 0.5-2.5 (3) MG/3ML IN SOLN
3.0000 mL | Freq: Once | RESPIRATORY_TRACT | Status: AC
  Administered 2017-05-25: 3 mL via RESPIRATORY_TRACT

## 2017-05-25 SURGICAL SUPPLY — 24 items
BAG DRAIN CYSTO-URO LG1000N (MISCELLANEOUS) ×2 IMPLANT
BAG URINE DRAINAGE (UROLOGICAL SUPPLIES) ×1 IMPLANT
CATH FOLEY 2WAY  5CC 16FR (CATHETERS) ×1
CATH FOLEY 2WAY 5CC 16FR (CATHETERS) ×1
CATH URETL 5X70 OPEN END (CATHETERS) ×2 IMPLANT
CATH URTH 16FR FL 2W BLN LF (CATHETERS) IMPLANT
CONRAY 43 FOR UROLOGY 50M (MISCELLANEOUS) ×2 IMPLANT
GLOVE BIO SURGEON STRL SZ 6.5 (GLOVE) ×2 IMPLANT
GOWN STRL REUS W/ TWL LRG LVL3 (GOWN DISPOSABLE) ×2 IMPLANT
GOWN STRL REUS W/TWL LRG LVL3 (GOWN DISPOSABLE) ×4
KIT RM TURNOVER CYSTO AR (KITS) ×2 IMPLANT
PACK CYSTO AR (MISCELLANEOUS) ×2 IMPLANT
SCRUB POVIDONE IODINE 4 OZ (MISCELLANEOUS) IMPLANT
SENSORWIRE 0.038 NOT ANGLED (WIRE) ×2
SET CYSTO W/LG BORE CLAMP LF (SET/KITS/TRAYS/PACK) ×2 IMPLANT
SOL .9 NS 3000ML IRR  AL (IV SOLUTION) ×1
SOL .9 NS 3000ML IRR AL (IV SOLUTION) ×1
SOL .9 NS 3000ML IRR UROMATIC (IV SOLUTION) ×1 IMPLANT
STENT URET 6FRX24 CONTOUR (STENTS) ×1 IMPLANT
STENT URET 6FRX26 CONTOUR (STENTS) IMPLANT
SURGILUBE 2OZ TUBE FLIPTOP (MISCELLANEOUS) ×2 IMPLANT
SYRINGE IRR TOOMEY STRL 70CC (SYRINGE) ×2 IMPLANT
WATER STERILE IRR 1000ML POUR (IV SOLUTION) ×2 IMPLANT
WIRE SENSOR 0.038 NOT ANGLED (WIRE) ×1 IMPLANT

## 2017-05-25 NOTE — Anesthesia Preprocedure Evaluation (Signed)
Anesthesia Evaluation  Patient identified by MRN, date of birth, ID band Patient awake    Reviewed: Allergy & Precautions, NPO status , Patient's Chart, lab work & pertinent test results  History of Anesthesia Complications Negative for: history of anesthetic complications  Airway Mallampati: III  TM Distance: >3 FB Neck ROM: Full    Dental  (+) Poor Dentition   Pulmonary asthma (mild intermittent) , neg sleep apnea,    breath sounds clear to auscultation- rhonchi (-) wheezing      Cardiovascular Exercise Tolerance: Good (-) hypertension(-) CAD, (-) Past MI and (-) Cardiac Stents  Rhythm:Regular Rate:Normal - Systolic murmurs and - Diastolic murmurs    Neuro/Psych  Headaches, negative psych ROS   GI/Hepatic negative GI ROS, Neg liver ROS,   Endo/Other  negative endocrine ROSneg diabetes  Renal/GU Renal disease: nephrolithiasis, pyelonephritis.     Musculoskeletal negative musculoskeletal ROS (+)   Abdominal (+) + obese,   Peds  Hematology negative hematology ROS (+)   Anesthesia Other Findings Past Medical History: No date: Allergic rhinitis No date: Chronic fatigue syndrome     Comment:  CFIDs; also chronic joint and mucle pain (possible tik               bourne illness?). Dr. Cristy Friedlander  No date: Chronic gastritis No date: Hepatic steatosis No date: Hyperlipidemia No date: IBS (irritable bowel syndrome) No date: Lyme disease     Comment:  "stari" No date: Migraine No date: Narcolepsy No date: Neuromuscular disorder (HCC) No date: Rectal prolapse No date: Serrated adenoma of colon No date: Umbilical hernia No date: Vaginal prolapse 2003: West Nile Virus infection   Reproductive/Obstetrics                             Anesthesia Physical Anesthesia Plan  ASA: II and emergent  Anesthesia Plan: General   Post-op Pain Management:    Induction: Intravenous, Cricoid pressure  planned and Rapid sequence  PONV Risk Score and Plan: 2 and Ondansetron and Dexamethasone  Airway Management Planned: Oral ETT  Additional Equipment:   Intra-op Plan:   Post-operative Plan: Extubation in OR  Informed Consent: I have reviewed the patients History and Physical, chart, labs and discussed the procedure including the risks, benefits and alternatives for the proposed anesthesia with the patient or authorized representative who has indicated his/her understanding and acceptance.   Dental advisory given  Plan Discussed with: CRNA and Anesthesiologist  Anesthesia Plan Comments:         Anesthesia Quick Evaluation

## 2017-05-25 NOTE — Transfer of Care (Signed)
Immediate Anesthesia Transfer of Care Note  Patient: [name] Branch  Procedure(s) Performed: Procedure(s): CYSTOSCOPY WITH STENT PLACEMENT (Right)  Patient Location: PACU  Anesthesia Type:General  Level of Consciousness: drowsy and patient cooperative  Airway & Oxygen Therapy: Patient Spontanous Breathing and Patient connected to face mask oxygen  Post-op Assessment: Report given to RN and Post -op Vital signs reviewed and stable  Post vital signs: Reviewed and stable  Last Vitals:  Vitals:   05/25/17 1024 05/25/17 1323  BP:  109/62  Pulse:  (!) 108  Resp:  14  Temp: 37 C 36.6 C    Last Pain:  Vitals:   05/25/17 1024  TempSrc: Oral  PainSc:          Complications: No apparent anesthesia complications

## 2017-05-25 NOTE — Anesthesia Procedure Notes (Signed)
Procedure Name: Intubation Date/Time: 05/25/2017 12:57 PM Performed by: Jonna Clark Pre-anesthesia Checklist: Patient identified, Patient being monitored, Timeout performed, Emergency Drugs available and Suction available Patient Re-evaluated:Patient Re-evaluated prior to induction Oxygen Delivery Method: Circle system utilized Preoxygenation: Pre-oxygenation with 100% oxygen Induction Type: IV induction Ventilation: Mask ventilation without difficulty Laryngoscope Size: Mac and 3 Grade View: Grade I Tube type: Oral Tube size: 7.0 mm Number of attempts: 1 Airway Equipment and Method: Stylet Placement Confirmation: ETT inserted through vocal cords under direct vision,  positive ETCO2 and breath sounds checked- equal and bilateral Secured at: 21 cm Tube secured with: Tape Dental Injury: Teeth and Oropharynx as per pre-operative assessment

## 2017-05-25 NOTE — Op Note (Signed)
Date of procedure: 05/25/17  Preoperative diagnosis:  1. Right obstructing ureteral calculus 2. Fevers 3. Right pyelonephritis   Postoperative diagnosis:  1. Same as above   Procedure: 1. Cystoscopy 2. Right retrograde pyelogram 3. Right ureteral stent placement  Surgeon: Hollice Espy, MD  Anesthesia: General  Complications: None  Intraoperative findings: Efflux of purulent urine upon stent placement.  EBL: minmal  Specimens:  None  Drains: 6 x 24 French double-J ureteral stent on right, 16 Fr Foley catheter  Indication: [name] Branch is a 61 y.o. patient with 8 mm right proximal obstructing ureteral stone.  After reviewing the management options for treatment, she elected to proceed with the above surgical procedure(s). We have discussed the potential benefits and risks of the procedure, side effects of the proposed treatment, the likelihood of the patient achieving the goals of the procedure, and any potential problems that might occur during the procedure or recuperation. Informed consent has been obtained.  Description of procedure:  The patient was taken to the operating room and general anesthesia was induced.  The patient was placed in the dorsal lithotomy position, prepped and draped in the usual sterile fashion, and preoperative antibiotics were administered. A preoperative time-out was performed.   A 21 French cystoscope was advanced per urethra into the bladder. Of note, the bladder was noted to be distended with a large amount of debris. The bladder and debris was drained. Attention was turned to the right ureteral orifice which was somewhat distorted due to her prolapse. This was cannulated using a 5 [place] open-ended ureteral catheter and gentle contrast was injected just enough to outline the collecting system to ensure adequate stent placement. This was performed using very low pressure. The wire was then placed up to level of the kidney without difficulty. A 6  x 24 French double-J ureteral stent was then advanced up to level of the kidney without difficulty. The wire was withdrawn until full coil was noted within the renal pelvis per the wire was then fully withdrawn until coil was noted within the bladder. Upon laser of the stent, there was reflux of debris-filled purulent material consistent with pyonephrosis. A 16 French Foley catheter was then placed and the balloon filled with 10 cc of sterile water. The patient was then clear dried, repositioned the supine position, reversed from anesthesia, taken to PACU in stable condition.  Plan: Continue abx.  Will follow.  OK to d/c catheter tomorrow if improving clinically.    Hollice Espy, M.D.

## 2017-05-25 NOTE — Progress Notes (Signed)
[facility] [place] NAME: [name] Branch    MR#:  [id number]  DATE OF BIRTH:  1956-04-29  SUBJECTIVE:  CHIEF COMPLAINT:   Chief Complaint  Patient presents with  . Nausea  . Abdominal Pain   -feels better with pain today, but spiking fevers again- so CT abd ordered - sleeping today- has narcolepsy- proviligil was on hold here  REVIEW OF SYSTEMS:  Review of Systems  Constitutional: Positive for fever. Negative for chills and malaise/fatigue.  HENT: Negative for congestion, ear discharge, hearing loss and nosebleeds.   Eyes: Negative for blurred vision and double vision.  Respiratory: Negative for cough, shortness of breath and wheezing.   Cardiovascular: Negative for chest pain and palpitations.  Gastrointestinal: Positive for nausea. Negative for abdominal pain, constipation, diarrhea and vomiting.  Genitourinary: Negative for dysuria.  Musculoskeletal: Positive for back pain and myalgias.  Neurological: Negative for dizziness, sensory change, speech change, focal weakness, seizures and headaches.  Psychiatric/Behavioral: Negative for depression.    DRUG ALLERGIES:   Allergies  Allergen Reactions  . Sulfonamide Derivatives Nausea And Vomiting    VITALS:  Blood pressure (!) 152/55, pulse (!) 124, temperature 98.6 F (37 C), temperature source Oral, resp. rate 20, height 5\' 6"  (1.676 m), weight 91.9 kg (202 lb 8 oz), SpO2 90 %.  PHYSICAL EXAMINATION:  Physical Exam  GENERAL:  61 y.o.-year-old patient lying in the bed, not in acute distress now EYES: Pupils equal, round, reactive to light and accommodation. No scleral icterus. Extraocular muscles intact.  HEENT: Head atraumatic, normocephalic. Oropharynx and nasopharynx clear.  NECK:  Supple, no jugular venous distention. No thyroid enlargement, no tenderness.  LUNGS: Normal breath sounds bilaterally, no wheezing, rales,rhonchi or crepitation. No use of accessory muscles of  respiration.  CARDIOVASCULAR: S1, S2 normal. No murmurs, rubs, or gallops.  ABDOMEN: Soft, nontender, But has some left flank discomfort, nondistended. Bowel sounds present. No organomegaly or mass.  EXTREMITIES: No pedal edema, cyanosis, or clubbing.  NEUROLOGIC: Cranial nerves II through XII are intact. Muscle strength 5/5 in all extremities. Sensation intact. Gait not checked.  PSYCHIATRIC: The patient is alert and oriented x 3. Sleepy today, easily arousable SKIN: No obvious rash, lesion, or ulcer.    LABORATORY PANEL:   CBC  Recent Labs Lab 05/24/17 0435  WBC 10.8  HGB 11.3*  HCT 33.9*  PLT 133*   ------------------------------------------------------------------------------------------------------------------  Chemistries   Recent Labs Lab 05/25/17 0408  NA 134*  K 3.9  CL 101  CO2 26  GLUCOSE 128*  BUN 15  CREATININE 0.92  CALCIUM 8.3*  AST 36  ALT 24  ALKPHOS 89  BILITOT 0.8   ------------------------------------------------------------------------------------------------------------------  Cardiac Enzymes No results for input(s): TROPONINI in the last 168 hours. ------------------------------------------------------------------------------------------------------------------  RADIOLOGY:  US Renal  Result Date: 05/24/2017 CLINICAL DATA:  61 year old female with flank and abdominal pain and fever. EXAM: RENAL / URINARY TRACT ULTRASOUND COMPLETE COMPARISON:  02/24/2016 CT and prior studies FINDINGS: Right Kidney: Length: 11.2 cm. Mild right hydronephrosis is noted. No focal renal abnormalities noted. Small amount of perinephric fluid is present. Left Kidney: Length: 11.9 cm. Echogenicity within normal limits. No mass or hydronephrosis visualized. Bladder: Appears normal for degree of bladder distention. A normal left ureteral jet is identified. A right ureteral jet is not noted. Bladder volume is 480 cc.  The patient was unable to void. IMPRESSION: Mild right  hydronephrosis and right ureteral jet is not identified - suspicious for obstructing cause/calculus  which is not identified on this study. Consider further evaluation with CT. Unremarkable left kidney. Electronically Signed   By: Margarette Canada M.D.   On: 05/24/2017 09:45   Ct Renal Stone Study  Result Date: 05/25/2017 CLINICAL DATA:  Fever and right flank pain. Nausea and vomiting. Right hydronephrosis. EXAM: CT ABDOMEN AND PELVIS WITHOUT CONTRAST TECHNIQUE: Multidetector CT imaging of the abdomen and pelvis was performed following the standard protocol without IV contrast. COMPARISON:  CT scan dated 02/24/2016 and ultrasound dated 05/24/2017 FINDINGS: Lower chest: There is moderate bibasilar atelectasis, right greater than left, with tiny bilateral effusions. Hepatobiliary: No focal liver abnormality is seen. Status post cholecystectomy. No biliary dilatation. Pancreas: Unremarkable. No pancreatic ductal dilatation or surrounding inflammatory changes. Spleen: Normal in size without focal abnormality. Adrenals/Urinary Tract: 8 mm stone obstructing the proximal right ureter at the ureteropelvic junction creating right hydronephrosis and right perinephric soft tissue stranding. Distal right ureter is normal. Left kidney and ureter appear normal. Adrenal glands are normal.  Bladder is normal. Stomach/Bowel: Prominent diverticulum in the second portion of the duodenum. The bowel is otherwise normal. Vascular/Lymphatic: Aortic atherosclerosis. No enlarged abdominal or pelvic lymph nodes. Reproductive: Uterus has been removed.  Ovaries are normal. Other: Small periumbilical hernia containing only fat. Musculoskeletal: No acute or significant osseous findings. IMPRESSION: 1. 8 mm stone obstructing the proximal right ureter creating right hydronephrosis and perinephric soft tissue stranding. 2. Moderate bibasilar atelectasis with tiny effusions. 3. Aortic atherosclerosis. Electronically Signed   By: Lorriane Shire M.D.    On: 05/25/2017 09:28    EKG:   Orders placed or performed during the hospital encounter of 05/23/17  . EKG 12-Lead  . EKG 12-Lead    ASSESSMENT AND PLAN:   61 year old female with past medical history secondary to chronic fatigue syndrome, chronic pain syndrome, pelvic prolapse, IBS, gastritis presents to the hospital secondary to worsening left flank pain, nausea and vomiting. Noted to have acute cystitis and possible left pyelonephritis.  #1 Sepsis- secondary to acute cystitis-possible left pyelonephritis -Due to persistent fevers, CT abd ordered today showing obstructing right ureteral calculus - urology consulted, patient kept NPO for possible intervention today -UA strongly positive for infection, urine cultures and blood cultures are growing group B strep- on rocephin - repeat blood cultures today - ID consulted.  #2 chronic pain syndrome-started on MS Contin. Also on IV morphine as needed here.  #3 chronic constipation-also history of rectal prolapse. Continue laxatives.  #4 DVT prophylaxis-Lovenox  #5 Narcolepsy- restarted provigil   Husband updated at bedside  All the records are reviewed and case discussed with Care Management/Social Workerr. Management plans discussed with the patient, family and they are in agreement.  CODE STATUS: Full code  TOTAL TIME TAKING CARE OF THIS PATIENT: 41 minutes.   POSSIBLE D/C IN 2-3  DAYS, DEPENDING ON CLINICAL CONDITION.   Gladstone Lighter M.D on 05/25/2017 at 12:32 PM  Between 7am to 6pm - Pager - [phone]  After 6pm go to [email or web address] - password [email or web address] [facility]  Office  [phone]  CC: Primary care physician; Tower, Wynelle Fanny, MD

## 2017-05-25 NOTE — Progress Notes (Signed)
Temp re-check, Dr. Tressia Miners paged to make aware.  Awaiting call back.  Clarise Cruz, RN    05/25/17 [phone]  Vitals  Temp (!) 101.6 F (38.7 C)  Temp Source Oral

## 2017-05-25 NOTE — Consult Note (Signed)
[facility] Infectious Disease     Reason for Consult:Fevers    Referring Physician:  Claria Dice Date of Admission:  05/23/2017   Active Problems:   Pyelonephritis   Hydronephrosis with obstructing calculus   HPI: [name] Branch is a 61 y.o. female admitted with L sided back pain and flank pain as well as NV. CT showed pyelonephritis and stone in R prox urether. BCX and UCX grew GRP B strep.  She has hx recurrent UTI due to rectovag prolapse. Underwent Cysto and stent 7/31 with findings of purulent urine upon stent placement.  Has had high spiking fevers prior to the procedure.  Reports feeling better since stent placement    Past Medical History:  Diagnosis Date  . Allergic rhinitis   . 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 Dr. Cristy Friedlander   . Chronic gastritis   . Hepatic steatosis   . Hyperlipidemia   . IBS (irritable bowel syndrome)   . Lyme disease    "stari"  . Migraine   . Narcolepsy   . Neuromuscular disorder ([place])   . Rectal prolapse   . Serrated adenoma of colon   . Umbilical hernia   . Vaginal prolapse   . West Nile Virus infection 2003   Past Surgical History:  Procedure Laterality Date  . 5th R toe fx  '09  . BREAST BIOPSY  '04   neg; right  . CHOLECYSTECTOMY  10/2011  . laprascopy    . PARTIAL HYSTERECTOMY     bleeding  . TONSILLECTOMY    . ULNAR NERVE REPAIR     sx times 5  . UPPER GASTROINTESTINAL ENDOSCOPY  12/04/2004   gastritis   Social History  Substance Use Topics  . Smoking status: Never Smoker  . Smokeless tobacco: Never Used     Comment: non smoker  . Alcohol use No   Family History  Problem Relation Age of Onset  . Diabetes Father   . Hypertension Father   . Stroke Father   . Hypertension Mother   . Cancer Mother        [name]  . Ulcers Mother   . Breast cancer Paternal Grandmother   . Bipolar disorder Unknown        some family  . Alcohol abuse Maternal  Grandfather   . Colon cancer Neg Hx     Allergies:  Allergies  Allergen Reactions  . Sulfonamide Derivatives Nausea And Vomiting    Current antibiotics: Antibiotics Given (last 72 hours)    Date/Time Action Medication Dose Rate   05/23/17 0505 New Bag/Given   ciprofloxacin (CIPRO) IVPB 400 mg 400 mg 200 mL/hr   05/23/17 1304 New Bag/Given   cefTRIAXone (ROCEPHIN) 1 g in dextrose 5 % 50 mL IVPB 1 g 100 mL/hr   05/23/17 2226 New Bag/Given   cefTRIAXone (ROCEPHIN) 1 g in dextrose 5 % 50 mL IVPB 1 g 100 mL/hr   05/24/17 1707 New Bag/Given   cefTRIAXone (ROCEPHIN) 2 g in dextrose 5 % 50 mL IVPB 2 g 100 mL/hr      MEDICATIONS: . bisacodyl  10 mg Rectal Daily  . cholecalciferol  1,000 Units Oral Daily  . docusate sodium  100 mg Oral BID  . enoxaparin (LOVENOX) injection  40 mg Subcutaneous Q24H  . linaclotide  145 mcg Oral QAC breakfast  . magnesium oxide  400 mg Oral Daily  . mouth rinse  15 mL Mouth Rinse  BID  . modafinil  100 mg Oral Daily  . morphine  15 mg Oral Q12H  . multivitamin with minerals  1 tablet Oral Daily  . ondansetron (ZOFRAN) IV  4 mg Intravenous Q6H  . senna  1 tablet Oral BID  . vitamin E  800 Units Oral Daily    Review of Systems - 11 systems reviewed and negative per HPI   OBJECTIVE: Temp:  [97.8 F (36.6 C)-103.3 F (39.6 C)] 98 F (36.7 C) (07/31 1436) Pulse Rate:  [99-124] 102 (07/31 1436) Resp:  [11-20] 15 (07/31 1408) BP: (93-152)/(45-62) 104/52 (07/31 1436) SpO2:  [87 %-95 %] 90 % (07/31 1436) Physical Exam  Constitutional:  oriented to person, place, and time. appears well-developed and well-nourished. No distress.  HENT: [place]/AT, PERRLA, no scleral icterus Mouth/Throat: Oropharynx is clear and moist. No oropharyngeal exudate.  Cardiovascular: Normal rate, regular rhythm and normal heart sounds. Exam reveals no gallop and no friction rub.  No murmur heard.  Pulmonary/Chest: Effort normal and breath sounds normal. No respiratory distress.   has no wheezes.  Neck = supple, no nuchal rigidity Abdominal: Soft. Bowel sounds are normal.  exhibits no distension. There is no tenderness.  Foley with red tinged urine Lymphadenopathy: no cervical adenopathy. No axillary adenopathy Neurological: alert and oriented to person, place, and time.  Skin: Skin is warm and dry. No rash noted. No erythema.  Psychiatric: a normal mood and affect.  behavior is normal.    LABS: Results for orders placed or performed during the hospital encounter of 05/23/17 (from the past 48 hour(s))  HIV antibody     Status: None   Collection Time: 05/24/17  4:35 AM  Result Value Ref Range   HIV Screen 4th Generation wRfx Non Reactive Non Reactive    Comment: (NOTE) Performed At: [facility] [place], [place] [id number] Lindon Romp MD IT:[id number]   CBC     Status: Abnormal   Collection Time: 05/24/17  4:35 AM  Result Value Ref Range   WBC 10.8 3.6 - 11.0 K/uL   RBC 4.17 3.80 - 5.20 MIL/uL   Hemoglobin 11.3 (L) 12.0 - 16.0 g/dL   HCT 33.9 (L) 35.0 - 47.0 %   MCV 81.5 80.0 - 100.0 fL   MCH 27.1 26.0 - 34.0 pg   MCHC 33.2 32.0 - 36.0 g/dL   RDW 14.6 (H) 11.5 - 14.5 %   Platelets 133 (L) 150 - 440 K/uL  Basic metabolic panel     Status: Abnormal   Collection Time: 05/24/17  4:35 AM  Result Value Ref Range   Sodium 132 (L) 135 - 145 mmol/L   Potassium 3.6 3.5 - 5.1 mmol/L   Chloride 100 (L) 101 - 111 mmol/L   CO2 26 22 - 32 mmol/L   Glucose, Bld 116 (H) 65 - 99 mg/dL   BUN 16 6 - 20 mg/dL   Creatinine, Ser 1.05 (H) 0.44 - 1.00 mg/dL   Calcium 8.3 (L) 8.9 - 10.3 mg/dL   GFR calc non Af Amer 56 (L) >60 mL/min   GFR calc Af Amer >60 >60 mL/min    Comment: (NOTE) The eGFR has been calculated using the CKD EPI equation. This calculation has not been validated in all clinical situations. eGFR's persistently <60 mL/min signify possible Chronic Kidney Disease.    Anion gap 6 5 - 15  Comprehensive metabolic panel      Status: Abnormal   Collection Time: 05/25/17  4:08 AM  Result Value Ref Range   Sodium 134 (L) 135 - 145 mmol/L   Potassium 3.9 3.5 - 5.1 mmol/L   Chloride 101 101 - 111 mmol/L   CO2 26 22 - 32 mmol/L   Glucose, Bld 128 (H) 65 - 99 mg/dL   BUN 15 6 - 20 mg/dL   Creatinine, Ser 0.92 0.44 - 1.00 mg/dL   Calcium 8.3 (L) 8.9 - 10.3 mg/dL   Total Protein 7.5 6.5 - 8.1 g/dL   Albumin 3.3 (L) 3.5 - 5.0 g/dL   AST 36 15 - 41 U/L   ALT 24 14 - 54 U/L   Alkaline Phosphatase 89 38 - 126 U/L   Total Bilirubin 0.8 0.3 - 1.2 mg/dL   GFR calc non Af Amer >60 >60 mL/min   GFR calc Af Amer >60 >60 mL/min    Comment: (NOTE) The eGFR has been calculated using the CKD EPI equation. This calculation has not been validated in all clinical situations. eGFR's persistently <60 mL/min signify possible Chronic Kidney Disease.    Anion gap 7 5 - 15   No components found for: ESR, C REACTIVE PROTEIN MICRO: Recent Results (from the past 720 hour(s))  Urine culture     Status: Abnormal   Collection Time: 05/23/17  3:29 AM  Result Value Ref Range Status   Specimen Description URINE, RANDOM  Final   Special Requests NONE  Final   Culture (A)  Final    >=100,000 COLONIES/mL GROUP B STREP(S.AGALACTIAE)ISOLATED TESTING AGAINST S. AGALACTIAE NOT ROUTINELY PERFORMED DUE TO PREDICTABILITY OF AMP/PEN/VAN SUSCEPTIBILITY. Performed at [facility], [place] [address], [place], [place] [address]    Report Status 05/24/2017 FINAL  Final  Blood Culture (routine x 2)     Status: Abnormal   Collection Time: 05/23/17  4:55 AM  Result Value Ref Range Status   Specimen Description BLOOD LEFT ANTECUBITAL  Final   Special Requests   Final    BOTTLES DRAWN AEROBIC AND ANAEROBIC Blood Culture adequate volume   Culture  Setup Time   Final    GRAM POSITIVE COCCI IN BOTH AEROBIC AND ANAEROBIC BOTTLES CRITICAL RESULT CALLED TO, READ BACK BY AND VERIFIED WITH: [name] SHOOTER AT 7412 ON 05/23/2017 JJB    Culture (A)   Final    GROUP B STREP(S.AGALACTIAE)ISOLATED SUSCEPTIBILITIES PERFORMED ON PREVIOUS CULTURE WITHIN THE LAST 5 DAYS. Performed at [facility], [place] [address] [place], [place] [address]    Report Status 05/25/2017 FINAL  Final  Blood Culture (routine x 2)     Status: Abnormal   Collection Time: 05/23/17  4:55 AM  Result Value Ref Range Status   Specimen Description BLOOD LEFT FOREARM  Final   Special Requests   Final    BOTTLES DRAWN AEROBIC AND ANAEROBIC Blood Culture adequate volume   Culture  Setup Time   Final    GRAM POSITIVE COCCI IN BOTH AEROBIC AND ANAEROBIC BOTTLES CRITICAL RESULT CALLED TO, READ BACK BY AND VERIFIED WITH: [name] SHOOTER AT 6720 ON 05/23/2017 JJB    Culture GROUP B STREP(S.AGALACTIAE)ISOLATED (A)  Final   Report Status 05/25/2017 FINAL  Final   Organism ID, Bacteria GROUP B STREP(S.AGALACTIAE)ISOLATED  Final      Susceptibility   Group b strep(s.agalactiae)isolated - MIC*    CLINDAMYCIN <=0.25 SENSITIVE Sensitive     AMPICILLIN <=0.25 SENSITIVE Sensitive     ERYTHROMYCIN 2 RESISTANT Resistant     VANCOMYCIN 0.25 SENSITIVE Sensitive     CEFTRIAXONE <=0.12  SENSITIVE Sensitive     LEVOFLOXACIN 1 SENSITIVE Sensitive     * GROUP B STREP(S.AGALACTIAE)ISOLATED  Blood Culture ID Panel (Reflexed)     Status: Abnormal   Collection Time: 05/23/17  4:55 AM  Result Value Ref Range Status   Enterococcus species NOT DETECTED NOT DETECTED Final   Listeria monocytogenes NOT DETECTED NOT DETECTED Final   Staphylococcus species NOT DETECTED NOT DETECTED Final   Staphylococcus aureus NOT DETECTED NOT DETECTED Final   Streptococcus species DETECTED (A) NOT DETECTED Final    Comment: CRITICAL RESULT CALLED TO, READ BACK BY AND VERIFIED WITH: [name] SHOOTER AT 1727 ON 05/23/2017 JJB    Streptococcus agalactiae DETECTED (A) NOT DETECTED Final    Comment: CRITICAL RESULT CALLED TO, READ BACK BY AND VERIFIED WITH: [name] SHOOTER AT 1727 ON 05/23/2017 JJB     Streptococcus pneumoniae NOT DETECTED NOT DETECTED Final   Streptococcus pyogenes NOT DETECTED NOT DETECTED Final   Acinetobacter baumannii NOT DETECTED NOT DETECTED Final   Enterobacteriaceae species NOT DETECTED NOT DETECTED Final   Enterobacter cloacae complex NOT DETECTED NOT DETECTED Final   Escherichia coli NOT DETECTED NOT DETECTED Final   Klebsiella oxytoca NOT DETECTED NOT DETECTED Final   Klebsiella pneumoniae NOT DETECTED NOT DETECTED Final   Proteus species NOT DETECTED NOT DETECTED Final   Serratia marcescens NOT DETECTED NOT DETECTED Final   Haemophilus influenzae NOT DETECTED NOT DETECTED Final   Neisseria meningitidis NOT DETECTED NOT DETECTED Final   Pseudomonas aeruginosa NOT DETECTED NOT DETECTED Final   Candida albicans NOT DETECTED NOT DETECTED Final   Candida glabrata NOT DETECTED NOT DETECTED Final   Candida krusei NOT DETECTED NOT DETECTED Final   Candida parapsilosis NOT DETECTED NOT DETECTED Final   Candida tropicalis NOT DETECTED NOT DETECTED Final    IMAGING: US Renal  Result Date: 05/24/2017 CLINICAL DATA:  61 year old female with flank and abdominal pain and fever. EXAM: RENAL / URINARY TRACT ULTRASOUND COMPLETE COMPARISON:  02/24/2016 CT and prior studies FINDINGS: Right Kidney: Length: 11.2 cm. Mild right hydronephrosis is noted. No focal renal abnormalities noted. Small amount of perinephric fluid is present. Left Kidney: Length: 11.9 cm. Echogenicity within normal limits. No mass or hydronephrosis visualized. Bladder: Appears normal for degree of bladder distention. A normal left ureteral jet is identified. A right ureteral jet is not noted. Bladder volume is 480 cc.  The patient was unable to void. IMPRESSION: Mild right hydronephrosis and right ureteral jet is not identified - suspicious for obstructing cause/calculus which is not identified on this study. Consider further evaluation with CT. Unremarkable left kidney. Electronically Signed   By: Margarette Canada  M.D.   On: 05/24/2017 09:45   Ct Renal Stone Study  Result Date: 05/25/2017 CLINICAL DATA:  Fever and right flank pain. Nausea and vomiting. Right hydronephrosis. EXAM: CT ABDOMEN AND PELVIS WITHOUT CONTRAST TECHNIQUE: Multidetector CT imaging of the abdomen and pelvis was performed following the standard protocol without IV contrast. COMPARISON:  CT scan dated 02/24/2016 and ultrasound dated 05/24/2017 FINDINGS: Lower chest: There is moderate bibasilar atelectasis, right greater than left, with tiny bilateral effusions. Hepatobiliary: No focal liver abnormality is seen. Status post cholecystectomy. No biliary dilatation. Pancreas: Unremarkable. No pancreatic ductal dilatation or surrounding inflammatory changes. Spleen: Normal in size without focal abnormality. Adrenals/Urinary Tract: 8 mm stone obstructing the proximal right ureter at the ureteropelvic junction creating right hydronephrosis and right perinephric soft tissue stranding. Distal right ureter is normal. Left kidney and ureter appear normal. Adrenal glands are  normal.  Bladder is normal. Stomach/Bowel: Prominent diverticulum in the second portion of the duodenum. The bowel is otherwise normal. Vascular/Lymphatic: Aortic atherosclerosis. No enlarged abdominal or pelvic lymph nodes. Reproductive: Uterus has been removed.  Ovaries are normal. Other: Small periumbilical hernia containing only fat. Musculoskeletal: No acute or significant osseous findings. IMPRESSION: 1. 8 mm stone obstructing the proximal right ureter creating right hydronephrosis and perinephric soft tissue stranding. 2. Moderate bibasilar atelectasis with tiny effusions. 3. Aortic atherosclerosis. Electronically Signed   By: Lorriane Shire M.D.   On: 05/25/2017 09:28    Assessment:   [name] Branch is a 61 y.o. female with obstructive nephrolithiasis and pyelonephritis with Grp B strep bacteremia. Clinically improving since stent placement 7/31 with findings of cloudy infected  urine. UCX also with GB strep.   Recommendations Continue ceftraixone while inpatient for ease of dosing but at DC would send on oral penicillin VK 500 mg QID for a total 21 day course of antibiotics given complicated pyelonephritis with bacteremia I can see towards end of Treatment course Will need urology follow up as well Thank you very much for allowing me to participate in the care of this patient. Please call with questions.   Cheral Marker. Ola Spurr, MD

## 2017-05-25 NOTE — Anesthesia Postprocedure Evaluation (Signed)
Anesthesia Post Note  Patient: [name] Branch  Procedure(s) Performed: Procedure(s) (LRB): CYSTOSCOPY WITH STENT PLACEMENT (Right)  Patient location during evaluation: PACU Anesthesia Type: General Level of consciousness: awake and alert and oriented Pain management: pain level controlled Vital Signs Assessment: post-procedure vital signs reviewed and stable Respiratory status: spontaneous breathing, nonlabored ventilation and respiratory function stable Cardiovascular status: blood pressure returned to baseline and stable Postop Assessment: no signs of nausea or vomiting Anesthetic complications: no     Last Vitals:  Vitals:   05/25/17 1349 05/25/17 1353  BP:  (!) 95/52  Pulse: 99 (!) 101  Resp: 16 13  Temp:      Last Pain:  Vitals:   05/25/17 1353  TempSrc:   PainSc: 0-No pain                 Tegh Franek

## 2017-05-25 NOTE — Progress Notes (Signed)
Made Dr. Tressia Miners aware of patient's current temp.  New order received.  Will continue to monitor.  Clarise Cruz, RN    05/25/17 0707  Vitals  Temp (!) 103.3 F (39.6 C)  Temp Source Oral  Pain Assessment  Pain Assessment No/denies pain  Pain Score 0

## 2017-05-25 NOTE — Anesthesia Post-op Follow-up Note (Cosign Needed)
Anesthesia QCDR form completed.        

## 2017-05-25 NOTE — Consult Note (Signed)
Urology Consult  I have been asked to see the patient by Dr. Tressia Miners, for evaluation and management of obstructive right ureteral stone, fevers.  Chief Complaint: Fevers/pyelonephritis  History of Present Illness: [name] Branch is a 61 y.o. year old with a personal history of significant vaginal prolapse and chronic group B strep bacterial colonization admitted on 05/23/2017 with abdominal pain, nausea and vomiting.  She underwent renal ultrasound yesterday indicating a distended bladder yesterday as well as mild right hydronephrosis and no ureteral jet.  Follow-up CT scan this morning showed a right proximal ureteral catheter is measuring 8 mm x 10 mm with perinephric stranding and hydronephrosis.  She continues to have fevers up to 103.3 this morning. She's also been tachycardic with her fevers but no hypotension appreciated. She does have mild right flank pain which is not significant. She had a poor appetite but no nausea or vomiting.  She is currently on ceftriaxone.  Both blood and urine cultures growing group B strep.  She does have a known personal history of kidney stones but never had surgical intervention for these.  Past Medical History:  Diagnosis Date  . Allergic rhinitis   . 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 Dr. Cristy Friedlander   . Chronic gastritis   . Hepatic steatosis   . Hyperlipidemia   . IBS (irritable bowel syndrome)   . Lyme disease    "stari"  . Migraine   . Narcolepsy   . Neuromuscular disorder ([place])   . Rectal prolapse   . Serrated adenoma of colon   . Umbilical hernia   . Vaginal prolapse   . West Nile Virus infection 2003    Past Surgical History:  Procedure Laterality Date  . 5th R toe fx  '09  . BREAST BIOPSY  '04   neg; right  . CHOLECYSTECTOMY  10/2011  . laprascopy    . PARTIAL HYSTERECTOMY     bleeding  . TONSILLECTOMY    . ULNAR NERVE REPAIR     sx times 5  . UPPER  GASTROINTESTINAL ENDOSCOPY  12/04/2004   gastritis    Home Medications:  Current Meds  Medication Sig  . morphine (MSIR) 30 MG tablet Take 30 mg by mouth every 3 (three) hours as needed for severe pain. UAD PRN  . promethazine (PHENERGAN) 25 MG tablet Take 1 tablet (25 mg total) by mouth every 6 (six) hours as needed for nausea.    Allergies:  Allergies  Allergen Reactions  . Sulfonamide Derivatives Nausea And Vomiting    Family History  Problem Relation Age of Onset  . Diabetes Father   . Hypertension Father   . Stroke Father   . Hypertension Mother   . Cancer Mother        [name]  . Ulcers Mother   . Breast cancer Paternal Grandmother   . Bipolar disorder Unknown        some family  . Alcohol abuse Maternal Grandfather   . Colon cancer Neg Hx     Social History:  reports that she has never smoked. She has never used smokeless tobacco. She reports that she does not drink alcohol or use drugs.  ROS: A complete review of systems was performed.  She does endorse fevers, poor appetite, mild diffuse abdominal pain as above, poor appetite, fatigue/weakness, chronic constipation, and baseline urinary symptoms including incomplete bladder emptying related pelvic organ prolapse.  All other systems  otherwise negative.  Physical Exam:  Vital signs in last 24 hours: Temp:  [98.6 F (37 C)-103.3 F (39.6 C)] 98.6 F (37 C) (07/31 1024) Pulse Rate:  [94-124] 124 (07/31 0455) Resp:  [16-20] 20 (07/31 0455) BP: (112-152)/(45-55) 152/55 (07/31 0455) SpO2:  [89 %-93 %] 90 % (07/31 0455) Constitutional:  Alert and oriented, No acute distress HEENT: [place] AT, moist mucus membranes.  Trachea midline, no masses Cardiovascular: Regular rate and rhythm, no clubbing, cyanosis, or edema. Respiratory: Normal respiratory effort, lungs clear bilaterally GI: Abdomen is soft, nontender, nondistended, no abdominal masses GU: No CVA tenderness Skin: No rashes, bruises or suspicious  lesions Neurologic: Grossly intact, no focal deficits, moving all 4 extremities Psychiatric: Normal mood and affect   Laboratory Data:   Recent Labs  05/23/17 0328 05/24/17 0435  WBC 7.9 10.8  HGB 12.9 11.3*  HCT 38.9 33.9*    Recent Labs  05/23/17 0328 05/24/17 0435 05/25/17 0408  NA 134* 132* 134*  K 3.8 3.6 3.9  CL 98* 100* 101  CO2 28 26 26   GLUCOSE 125* 116* 128*  BUN 15 16 15   CREATININE 0.62 1.05* 0.92  CALCIUM 9.2 8.3* 8.3*   No results for input(s): LABPT, INR in the last 72 hours. No results for input(s): LABURIN in the last 72 hours. Results for orders placed or performed during the hospital encounter of 05/23/17  Urine culture     Status: Abnormal   Collection Time: 05/23/17  3:29 AM  Result Value Ref Range Status   Specimen Description URINE, RANDOM  Final   Special Requests NONE  Final   Culture (A)  Final    >=100,000 COLONIES/mL GROUP B STREP(S.AGALACTIAE)ISOLATED TESTING AGAINST S. AGALACTIAE NOT ROUTINELY PERFORMED DUE TO PREDICTABILITY OF AMP/PEN/VAN SUSCEPTIBILITY. Performed at [facility], [place] [address], [place], [place] [address]    Report Status 05/24/2017 FINAL  Final  Blood Culture (routine x 2)     Status: Abnormal (Preliminary result)   Collection Time: 05/23/17  4:55 AM  Result Value Ref Range Status   Specimen Description BLOOD LEFT ANTECUBITAL  Final   Special Requests   Final    BOTTLES DRAWN AEROBIC AND ANAEROBIC Blood Culture adequate volume   Culture  Setup Time   Final    GRAM POSITIVE COCCI IN BOTH AEROBIC AND ANAEROBIC BOTTLES CRITICAL RESULT CALLED TO, READ BACK BY AND VERIFIED WITH: [name] SHOOTER AT 5631 ON 05/23/2017 JJB    Culture GROUP B STREP(S.AGALACTIAE)ISOLATED (A)  Final   Report Status PENDING  Incomplete  Blood Culture (routine x 2)     Status: Abnormal (Preliminary result)   Collection Time: 05/23/17  4:55 AM  Result Value Ref Range Status   Specimen Description BLOOD LEFT FOREARM  Final    Special Requests   Final    BOTTLES DRAWN AEROBIC AND ANAEROBIC Blood Culture adequate volume   Culture  Setup Time   Final    GRAM POSITIVE COCCI IN BOTH AEROBIC AND ANAEROBIC BOTTLES CRITICAL RESULT CALLED TO, READ BACK BY AND VERIFIED WITH: [name] SHOOTER AT 4970 ON 05/23/2017 JJB    Culture (A)  Final    GROUP B STREP(S.AGALACTIAE)ISOLATED SUSCEPTIBILITIES TO FOLLOW Performed at [facility], [place] [address] [place], [place] [address]    Report Status PENDING  Incomplete  Blood Culture ID Panel (Reflexed)     Status: Abnormal   Collection Time: 05/23/17  4:55 AM  Result Value Ref Range Status   Enterococcus species NOT DETECTED  NOT DETECTED Final   Listeria monocytogenes NOT DETECTED NOT DETECTED Final   Staphylococcus species NOT DETECTED NOT DETECTED Final   Staphylococcus aureus NOT DETECTED NOT DETECTED Final   Streptococcus species DETECTED (A) NOT DETECTED Final    Comment: CRITICAL RESULT CALLED TO, READ BACK BY AND VERIFIED WITH: [name] SHOOTER AT 1727 ON 05/23/2017 JJB    Streptococcus agalactiae DETECTED (A) NOT DETECTED Final    Comment: CRITICAL RESULT CALLED TO, READ BACK BY AND VERIFIED WITH: [name] SHOOTER AT 1727 ON 05/23/2017 JJB    Streptococcus pneumoniae NOT DETECTED NOT DETECTED Final   Streptococcus pyogenes NOT DETECTED NOT DETECTED Final   Acinetobacter baumannii NOT DETECTED NOT DETECTED Final   Enterobacteriaceae species NOT DETECTED NOT DETECTED Final   Enterobacter cloacae complex NOT DETECTED NOT DETECTED Final   Escherichia coli NOT DETECTED NOT DETECTED Final   Klebsiella oxytoca NOT DETECTED NOT DETECTED Final   Klebsiella pneumoniae NOT DETECTED NOT DETECTED Final   Proteus species NOT DETECTED NOT DETECTED Final   Serratia marcescens NOT DETECTED NOT DETECTED Final   Haemophilus influenzae NOT DETECTED NOT DETECTED Final   Neisseria meningitidis NOT DETECTED NOT DETECTED Final   Pseudomonas aeruginosa NOT DETECTED NOT DETECTED  Final   Candida albicans NOT DETECTED NOT DETECTED Final   Candida glabrata NOT DETECTED NOT DETECTED Final   Candida krusei NOT DETECTED NOT DETECTED Final   Candida parapsilosis NOT DETECTED NOT DETECTED Final   Candida tropicalis NOT DETECTED NOT DETECTED Final     Radiologic Imaging: US Renal  Result Date: 05/24/2017 CLINICAL DATA:  61 year old female with flank and abdominal pain and fever. EXAM: RENAL / URINARY TRACT ULTRASOUND COMPLETE COMPARISON:  02/24/2016 CT and prior studies FINDINGS: Right Kidney: Length: 11.2 cm. Mild right hydronephrosis is noted. No focal renal abnormalities noted. Small amount of perinephric fluid is present. Left Kidney: Length: 11.9 cm. Echogenicity within normal limits. No mass or hydronephrosis visualized. Bladder: Appears normal for degree of bladder distention. A normal left ureteral jet is identified. A right ureteral jet is not noted. Bladder volume is 480 cc.  The patient was unable to void. IMPRESSION: Mild right hydronephrosis and right ureteral jet is not identified - suspicious for obstructing cause/calculus which is not identified on this study. Consider further evaluation with CT. Unremarkable left kidney. Electronically Signed   By: Margarette Canada M.D.   On: 05/24/2017 09:45   Ct Renal Stone Study  Result Date: 05/25/2017 CLINICAL DATA:  Fever and right flank pain. Nausea and vomiting. Right hydronephrosis. EXAM: CT ABDOMEN AND PELVIS WITHOUT CONTRAST TECHNIQUE: Multidetector CT imaging of the abdomen and pelvis was performed following the standard protocol without IV contrast. COMPARISON:  CT scan dated 02/24/2016 and ultrasound dated 05/24/2017 FINDINGS: Lower chest: There is moderate bibasilar atelectasis, right greater than left, with tiny bilateral effusions. Hepatobiliary: No focal liver abnormality is seen. Status post cholecystectomy. No biliary dilatation. Pancreas: Unremarkable. No pancreatic ductal dilatation or surrounding inflammatory  changes. Spleen: Normal in size without focal abnormality. Adrenals/Urinary Tract: 8 mm stone obstructing the proximal right ureter at the ureteropelvic junction creating right hydronephrosis and right perinephric soft tissue stranding. Distal right ureter is normal. Left kidney and ureter appear normal. Adrenal glands are normal.  Bladder is normal. Stomach/Bowel: Prominent diverticulum in the second portion of the duodenum. The bowel is otherwise normal. Vascular/Lymphatic: Aortic atherosclerosis. No enlarged abdominal or pelvic lymph nodes. Reproductive: Uterus has been removed.  Ovaries are normal. Other: Small periumbilical hernia containing only fat. Musculoskeletal: No  acute or significant osseous findings. IMPRESSION: 1. 8 mm stone obstructing the proximal right ureter creating right hydronephrosis and perinephric soft tissue stranding. 2. Moderate bibasilar atelectasis with tiny effusions. 3. Aortic atherosclerosis. Electronically Signed   By: Lorriane Shire M.D.   On: 05/25/2017 09:28   CT scan personally reviewed today.  Impression/Plan:  1. Right obstructing ureteral calculus-recommend emergent intervention for right ureteral stent placement in the setting of an obstructing stone, fevers to 103.3 and positive urine and blood cultures.  Recommend stent for the purpose of source control and optimization of renal drainage. Risks of stent were discussed in detail including acute worsening of infectious symptoms, flank pain, hematuria, urgency/frequency, and possible inability to place the stent based on her anatomic stress. Risk and benefits reviewed. Consent discussed/signed and patient's emergent right side.  2. UTI/pyelonephritis- continue ceftriaxone, broaden in antibiotics if he fails to respond after stent  3. Pelvic organ prolapse- significant anorectal prolapse managed by Dr. Otho Perl in [place], surgery continues to be delayed/deferred due to chronic colonization with group B  strep  05/25/2017, 12:19 PM  Hollice Espy,  MD

## 2017-05-26 ENCOUNTER — Inpatient Hospital Stay: Payer: 59

## 2017-05-26 DIAGNOSIS — N132 Hydronephrosis with renal and ureteral calculous obstruction: Secondary | ICD-10-CM

## 2017-05-26 DIAGNOSIS — Z87442 Personal history of urinary calculi: Secondary | ICD-10-CM

## 2017-05-26 DIAGNOSIS — N12 Tubulo-interstitial nephritis, not specified as acute or chronic: Secondary | ICD-10-CM

## 2017-05-26 HISTORY — DX: Personal history of urinary calculi: Z87.442

## 2017-05-26 LAB — BASIC METABOLIC PANEL
ANION GAP: 4 — AB (ref 5–15)
BUN: 16 mg/dL (ref 6–20)
CALCIUM: 8.3 mg/dL — AB (ref 8.9–10.3)
CO2: 28 mmol/L (ref 22–32)
Chloride: 105 mmol/L (ref 101–111)
Creatinine, Ser: 0.7 mg/dL (ref 0.44–1.00)
Glucose, Bld: 130 mg/dL — ABNORMAL HIGH (ref 65–99)
POTASSIUM: 3.9 mmol/L (ref 3.5–5.1)
SODIUM: 137 mmol/L (ref 135–145)

## 2017-05-26 LAB — CBC
HCT: 31.9 % — ABNORMAL LOW (ref 35.0–47.0)
Hemoglobin: 10.6 g/dL — ABNORMAL LOW (ref 12.0–16.0)
MCH: 27.2 pg (ref 26.0–34.0)
MCHC: 33.4 g/dL (ref 32.0–36.0)
MCV: 81.5 fL (ref 80.0–100.0)
PLATELETS: 127 10*3/uL — AB (ref 150–440)
RBC: 3.91 MIL/uL (ref 3.80–5.20)
RDW: 14.2 % (ref 11.5–14.5)
WBC: 10.1 10*3/uL (ref 3.6–11.0)

## 2017-05-26 LAB — ECHOCARDIOGRAM COMPLETE
HEIGHTINCHES: 66 in
WEIGHTICAEL: 3240 [oz_av]

## 2017-05-26 MED ORDER — HYDROCODONE-ACETAMINOPHEN 5-325 MG PO TABS: 1.0000 | ORAL_TABLET | ORAL | Status: DC | PRN

## 2017-05-26 MED ORDER — MORPHINE SULFATE (PF) 2 MG/ML IV SOLN: 2.0000 mg | Freq: Four times a day (QID) | INTRAVENOUS | Status: DC | PRN

## 2017-05-26 MED ORDER — GUAIFENESIN ER 600 MG PO TB12
600.0000 mg | ORAL_TABLET | Freq: Two times a day (BID) | ORAL | Status: DC
  Administered 2017-05-26 – 2017-05-28 (×5): 600 mg via ORAL
  Filled 2017-05-26 (×5): qty 1

## 2017-05-26 NOTE — Progress Notes (Signed)
[facility] NAME: [name] Branch    MR#:  [id number]  DATE OF BIRTH:  July 16, 1956  SUBJECTIVE:  CHIEF COMPLAINT:   Chief Complaint  Patient presents with  . Nausea  . Abdominal Pain   -Is much better today. S/p ureteral stents yesterday, only low grade fevers -still requiring 4l o2 now  REVIEW OF SYSTEMS:  Review of Systems  Constitutional: Positive for fever. Negative for chills and malaise/fatigue.  HENT: Negative for congestion, ear discharge, hearing loss and nosebleeds.   Eyes: Negative for blurred vision and double vision.  Respiratory: Positive for cough. Negative for shortness of breath and wheezing.   Cardiovascular: Negative for chest pain and palpitations.  Gastrointestinal: Negative for abdominal pain, constipation, diarrhea, nausea and vomiting.  Genitourinary: Negative for dysuria.  Musculoskeletal: Positive for back pain and myalgias.  Neurological: Negative for dizziness, sensory change, speech change, focal weakness, seizures and headaches.  Psychiatric/Behavioral: Negative for depression.    DRUG ALLERGIES:   Allergies  Allergen Reactions  . Sulfonamide Derivatives Nausea And Vomiting    VITALS:  Blood pressure 122/63, pulse 95, temperature 99.1 F (37.3 C), temperature source Oral, resp. rate 20, height 5\' 6"  (1.676 m), weight 91.6 kg (201 lb 14.4 oz), SpO2 96 %.  PHYSICAL EXAMINATION:  Physical Exam  GENERAL:  61 y.o.-year-old patient lying in the bed, not in acute distress EYES: Pupils equal, round, reactive to light and accommodation. No scleral icterus. Extraocular muscles intact.  HEENT: Head atraumatic, normocephalic. Oropharynx and nasopharynx clear.  NECK:  Supple, no jugular venous distention. No thyroid enlargement, no tenderness.  LUNGS: Normal breath sounds bilaterally, no wheezing, rales,rhonchi or crepitation. No use of accessory muscles of respiration.  CARDIOVASCULAR: S1, S2  normal. No murmurs, rubs, or gallops.  ABDOMEN: Soft, nontender,, nondistended. Bowel sounds present. No organomegaly or mass.  EXTREMITIES: No pedal edema, cyanosis, or clubbing.  NEUROLOGIC: Cranial nerves II through XII are intact. Muscle strength 5/5 in all extremities. Sensation intact. Gait not checked.  PSYCHIATRIC: The patient is alert and oriented x 3. Very alert and appears in great spirits SKIN: No obvious rash, lesion, or ulcer.    LABORATORY PANEL:   CBC  Recent Labs Lab 05/26/17 0441  WBC 10.1  HGB 10.6*  HCT 31.9*  PLT 127*   ------------------------------------------------------------------------------------------------------------------  Chemistries   Recent Labs Lab 05/25/17 0408 05/26/17 0441  NA 134* 137  K 3.9 3.9  CL 101 105  CO2 26 28  GLUCOSE 128* 130*  BUN 15 16  CREATININE 0.92 0.70  CALCIUM 8.3* 8.3*  AST 36  --   ALT 24  --   ALKPHOS 89  --   BILITOT 0.8  --    ------------------------------------------------------------------------------------------------------------------  Cardiac Enzymes No results for input(s): TROPONINI in the last 168 hours. ------------------------------------------------------------------------------------------------------------------  RADIOLOGY:  Ct Renal Stone Study  Result Date: 05/25/2017 CLINICAL DATA:  Fever and right flank pain. Nausea and vomiting. Right hydronephrosis. EXAM: CT ABDOMEN AND PELVIS WITHOUT CONTRAST TECHNIQUE: Multidetector CT imaging of the abdomen and pelvis was performed following the standard protocol without IV contrast. COMPARISON:  CT scan dated 02/24/2016 and ultrasound dated 05/24/2017 FINDINGS: Lower chest: There is moderate bibasilar atelectasis, right greater than left, with tiny bilateral effusions. Hepatobiliary: No focal liver abnormality is seen. Status post cholecystectomy. No biliary dilatation. Pancreas: Unremarkable. No pancreatic ductal dilatation or surrounding  inflammatory changes. Spleen: Normal in size without focal abnormality. Adrenals/Urinary Tract: 8 mm stone obstructing the  proximal right ureter at the ureteropelvic junction creating right hydronephrosis and right perinephric soft tissue stranding. Distal right ureter is normal. Left kidney and ureter appear normal. Adrenal glands are normal.  Bladder is normal. Stomach/Bowel: Prominent diverticulum in the second portion of the duodenum. The bowel is otherwise normal. Vascular/Lymphatic: Aortic atherosclerosis. No enlarged abdominal or pelvic lymph nodes. Reproductive: Uterus has been removed.  Ovaries are normal. Other: Small periumbilical hernia containing only fat. Musculoskeletal: No acute or significant osseous findings. IMPRESSION: 1. 8 mm stone obstructing the proximal right ureter creating right hydronephrosis and perinephric soft tissue stranding. 2. Moderate bibasilar atelectasis with tiny effusions. 3. Aortic atherosclerosis. Electronically Signed   By: Lorriane Shire Branch.   On: 05/25/2017 09:28    EKG:   Orders placed or performed during the hospital encounter of 05/23/17  . EKG 12-Lead  . EKG 12-Lead    ASSESSMENT AND PLAN:   61 year old female with past medical history secondary to chronic fatigue syndrome, chronic pain syndrome, pelvic prolapse, IBS, gastritis presents to the hospital secondary to worsening left flank pain, nausea and vomiting. Noted to have acute cystitis and possible left pyelonephritis.  #1 Sepsis- secondary to acute cystitis-left pyelonephritis -Due to persistent fevers, CT abd ordered showing obstructing right ureteral calculus-Appreciate urology consult. Status post right ureteral stent placement. -Foley discontinued today. Patient feels much better. No further fevers -UA strongly positive for infection, urine cultures and blood cultures are growing group B strep- on rocephin-appreciate ID consult. -Repeat blood cultures are pending. At discharge will change  to oral penicillin for 3 weeks  #2 chronic pain syndrome-on MS Contin. Also on IV morphine as needed here. Add oral pain medications today  #3 chronic constipation-also history of rectal prolapse. Continue laxatives.  #4 DVT prophylaxis-Lovenox  #5 Narcolepsy- restarted provigil  #6 hypoxia-has been needing 4 L of oxygen. -History of COPD but not on home oxygen. We'll discontinue IV fluids. Echocardiogram has been ordered. -Stat chest x-ray, and cough medications and monitor. -Wean oxygen as tolerated   Husband updated at bedside   All the records are reviewed and case discussed with Care Management/Social Workerr. Management plans discussed with the patient, family and they are in agreement.  CODE STATUS: Full code  TOTAL TIME TAKING CARE OF THIS PATIENT: 36 minutes.   POSSIBLE D/C TOMORROW, DEPENDING ON CLINICAL CONDITION.   Yarrow Linhart Branch on 05/26/2017 at 1:02 PM  Between 7am to 6pm - Pager - [phone]  After 6pm go to [email or web address] - password [email or web address] [facility]  Office  [phone]  CC: Primary care physician; Tower, Wynelle Fanny, MD

## 2017-05-26 NOTE — Progress Notes (Signed)
Urology Consult Follow Up  Subjective: Patient is without complaint this morning.  She is not having stent discomfort or flank pain.  UOP is good.  Urine is red tinged.  Tolerated dinner last evening.  She is forgetting to put her nasal cannula in after eating, so she has left herself a note.    Anti-infectives: Anti-infectives    Start     Dose/Rate Route Frequency Ordered Stop   05/24/17 1800  cefTRIAXone (ROCEPHIN) 2 g in dextrose 5 % 50 mL IVPB     2 g 100 mL/hr over 30 Minutes Intravenous Every 24 hours 05/23/17 1733     05/23/17 1800  ciprofloxacin (CIPRO) IVPB 400 mg  Status:  Discontinued     400 mg 200 mL/hr over 60 Minutes Intravenous Every 12 hours 05/23/17 0852 05/23/17 1009   05/23/17 1800  cefTRIAXone (ROCEPHIN) 1 g in dextrose 5 % 50 mL IVPB     1 g 100 mL/hr over 30 Minutes Intravenous  Once 05/23/17 1734 05/23/17 2317   05/23/17 1200  cefTRIAXone (ROCEPHIN) 1 g in dextrose 5 % 50 mL IVPB  Status:  Discontinued     1 g 100 mL/hr over 30 Minutes Intravenous Daily 05/23/17 1021 05/23/17 1733   05/23/17 0500  ciprofloxacin (CIPRO) IVPB 400 mg     400 mg 200 mL/hr over 60 Minutes Intravenous  Once 05/23/17 0447 05/23/17 0606   05/23/17 0445  cefTRIAXone (ROCEPHIN) 2 g in dextrose 5 % 50 mL IVPB  Status:  Discontinued     2 g 100 mL/hr over 30 Minutes Intravenous  Once 05/23/17 0440 05/23/17 0447      Current Facility-Administered Medications  Medication Dose Route Frequency Provider Last Rate Last Dose  . 0.9 %  sodium chloride infusion   Intravenous Continuous Demetrios Loll, MD 75 mL/hr at 05/26/17 0753    . acetaminophen (TYLENOL) tablet 650 mg  650 mg Oral Q6H PRN Harrie Foreman, MD   650 mg at 05/25/17 0455   Or  . acetaminophen (TYLENOL) suppository 650 mg  650 mg Rectal Q6H PRN Harrie Foreman, MD      . albuterol (PROVENTIL) (2.5 MG/3ML) 0.083% nebulizer solution 2.5 mg  2.5 mg Nebulization Q4H PRN Harrie Foreman, MD      . bisacodyl (DULCOLAX)  suppository 10 mg  10 mg Rectal Daily Gladstone Lighter, MD   10 mg at 05/25/17 1110  . cefTRIAXone (ROCEPHIN) 2 g in dextrose 5 % 50 mL IVPB  2 g Intravenous Q24H Gladstone Lighter, MD   Stopped at 05/25/17 1750  . cholecalciferol (VITAMIN D) tablet 1,000 Units  1,000 Units Oral Daily Harrie Foreman, MD   1,000 Units at 05/25/17 1110  . docusate sodium (COLACE) capsule 100 mg  100 mg Oral BID Gladstone Lighter, MD   100 mg at 05/25/17 2236  . enoxaparin (LOVENOX) injection 40 mg  40 mg Subcutaneous Q24H Harrie Foreman, MD   40 mg at 05/25/17 2236  . linaclotide (LINZESS) capsule 145 mcg  145 mcg Oral QAC breakfast Harrie Foreman, MD   145 mcg at 05/26/17 0737  . magnesium oxide (MAG-OX) tablet 400 mg  400 mg Oral Daily Harrie Foreman, MD   400 mg at 05/25/17 1110  . MEDLINE mouth rinse  15 mL Mouth Rinse BID Gladstone Lighter, MD   15 mL at 05/25/17 2237  . modafinil (PROVIGIL) tablet 100 mg  100 mg Oral Daily Gladstone Lighter, MD      .  morphine (MS CONTIN) 12 hr tablet 15 mg  15 mg Oral Q12H Harrie Foreman, MD   15 mg at 05/25/17 2236  . morphine 2 MG/ML injection 2 mg  2 mg Intravenous Q4H PRN Gladstone Lighter, MD   2 mg at 05/23/17 0952  . multivitamin with minerals tablet 1 tablet  1 tablet Oral Daily Harrie Foreman, MD   1 tablet at 05/25/17 1110  . ondansetron (ZOFRAN) injection 4 mg  4 mg Intravenous Q6H Gladstone Lighter, MD   4 mg at 05/26/17 8891  . orphenadrine (NORFLEX) 12 hr tablet 100 mg  100 mg Oral BID PRN Harrie Foreman, MD      . polyethylene glycol (MIRALAX / GLYCOLAX) packet 17 g  17 g Oral Daily PRN Harrie Foreman, MD   17 g at 05/24/17 1039  . prochlorperazine (COMPAZINE) injection 10 mg  10 mg Intravenous Q6H PRN Harrie Foreman, MD   10 mg at 05/25/17 0845  . senna (SENOKOT) tablet 8.6 mg  1 tablet Oral BID Harrie Foreman, MD   8.6 mg at 05/25/17 2236  . simethicone (MYLICON) chewable tablet 80 mg  80 mg Oral Q6H PRN Harrie Foreman, MD      . vitamin E capsule 800 Units  800 Units Oral Daily Harrie Foreman, MD   800 Units at 05/25/17 1110     Objective: Vital signs in last 24 hours: Temp:  [97.7 F (36.5 C)-101.6 F (38.7 C)] 99.1 F (37.3 C) (08/01 0527) Pulse Rate:  [75-108] 78 (08/01 0530) Resp:  [11-20] 20 (08/01 0527) BP: (82-109)/(41-62) 94/50 (08/01 0530) SpO2:  [87 %-95 %] 92 % (08/01 0530) FiO2 (%):  [36 %] 36 % (07/31 1616) Weight:  [201 lb 14.4 oz (91.6 kg)] 201 lb 14.4 oz (91.6 kg) (08/01 0530)  Intake/Output from previous day: 07/31 0701 - 08/01 0700 In: 3382.5 [P.O.:120; I.V.:3162.5; IV Piggyback:100] Out: 1400 [Urine:1400] Intake/Output this shift: No intake/output data recorded.   Physical Exam Constitutional: Well nourished. Alert and oriented, No acute distress. HEENT: [place] AT, moist mucus membranes. Trachea midline, no masses. Cardiovascular: No clubbing, cyanosis, or edema. Respiratory: Normal respiratory effort, no increased work of breathing. GI: Abdomen is soft, non tender, non distended, no abdominal masses. Liver and spleen not palpable.  No hernias appreciated.  Stool sample for occult testing is not indicated.   GU: No CVA tenderness.  No bladder fullness or masses.  Skin: No rashes, bruises or suspicious lesions. Lymph: No cervical or inguinal adenopathy. Neurologic: Grossly intact, no focal deficits, moving all 4 extremities. Psychiatric: Normal mood and affect.  Lab Results:   Recent Labs  05/24/17 0435 05/26/17 0441  WBC 10.8 10.1  HGB 11.3* 10.6*  HCT 33.9* 31.9*  PLT 133* 127*   BMET  Recent Labs  05/25/17 0408 05/26/17 0441  NA 134* 137  K 3.9 3.9  CL 101 105  CO2 26 28  GLUCOSE 128* 130*  BUN 15 16  CREATININE 0.92 0.70  CALCIUM 8.3* 8.3*   PT/INR No results for input(s): LABPROT, INR in the last 72 hours. ABG No results for input(s): PHART, HCO3 in the last 72 hours.  Invalid input(s): PCO2, PO2  Studies/Results: US  Renal  Result Date: 05/24/2017 CLINICAL DATA:  61 year old female with flank and abdominal pain and fever. EXAM: RENAL / URINARY TRACT ULTRASOUND COMPLETE COMPARISON:  02/24/2016 CT and prior studies FINDINGS: Right Kidney: Length: 11.2 cm. Mild right hydronephrosis is noted. No focal renal  abnormalities noted. Small amount of perinephric fluid is present. Left Kidney: Length: 11.9 cm. Echogenicity within normal limits. No mass or hydronephrosis visualized. Bladder: Appears normal for degree of bladder distention. A normal left ureteral jet is identified. A right ureteral jet is not noted. Bladder volume is 480 cc.  The patient was unable to void. IMPRESSION: Mild right hydronephrosis and right ureteral jet is not identified - suspicious for obstructing cause/calculus which is not identified on this study. Consider further evaluation with CT. Unremarkable left kidney. Electronically Signed   By: Margarette Canada M.D.   On: 05/24/2017 09:45   Ct Renal Stone Study  Result Date: 05/25/2017 CLINICAL DATA:  Fever and right flank pain. Nausea and vomiting. Right hydronephrosis. EXAM: CT ABDOMEN AND PELVIS WITHOUT CONTRAST TECHNIQUE: Multidetector CT imaging of the abdomen and pelvis was performed following the standard protocol without IV contrast. COMPARISON:  CT scan dated 02/24/2016 and ultrasound dated 05/24/2017 FINDINGS: Lower chest: There is moderate bibasilar atelectasis, right greater than left, with tiny bilateral effusions. Hepatobiliary: No focal liver abnormality is seen. Status post cholecystectomy. No biliary dilatation. Pancreas: Unremarkable. No pancreatic ductal dilatation or surrounding inflammatory changes. Spleen: Normal in size without focal abnormality. Adrenals/Urinary Tract: 8 mm stone obstructing the proximal right ureter at the ureteropelvic junction creating right hydronephrosis and right perinephric soft tissue stranding. Distal right ureter is normal. Left kidney and ureter appear normal.  Adrenal glands are normal.  Bladder is normal. Stomach/Bowel: Prominent diverticulum in the second portion of the duodenum. The bowel is otherwise normal. Vascular/Lymphatic: Aortic atherosclerosis. No enlarged abdominal or pelvic lymph nodes. Reproductive: Uterus has been removed.  Ovaries are normal. Other: Small periumbilical hernia containing only fat. Musculoskeletal: No acute or significant osseous findings. IMPRESSION: 1. 8 mm stone obstructing the proximal right ureter creating right hydronephrosis and perinephric soft tissue stranding. 2. Moderate bibasilar atelectasis with tiny effusions. 3. Aortic atherosclerosis. Electronically Signed   By: Lorriane Shire M.D.   On: 05/25/2017 09:28     Assessment and Plan [name] Branch is a 2 y.o. year old with a personal history of significant vaginal prolapse and chronic group B strep bacterial colonization admitted on 05/23/2017 with abdominal pain, nausea and vomiting. She underwent renal ultrasound yesterday indicating a distended bladder yesterday as well as mild right hydronephrosis and no ureteral jet.  Follow-up CT scan this morning showed a right proximal ureteral catheter is measuring 8 mm x 10 mm with perinephric stranding and hydronephrosis.  Patient spiked fevers to 103.3 and underwent emergent stent placement on 05/25/2017 with Dr. Erlene Quan.    1. Right ureteral calculus  - s/p ureteral stent placement   - Foley removed today  - will need definitive management for stone once the sepsis/pyelonephritis has cleared    2. UTI/pyelonephritis   - seen by Dr. Ola Spurr last evening -recommends the continuation of ceftriaxone while inpatient for ease of dosing but at DC would send on oral penicillin VK 500 mg QID for a total 21 day course of antibiotics given complicated pyelonephritis with bacteremia  3. Pelvic organ prolapse  - significant anorectal prolapse managed by Dr. Matilde Sprang in [place], surgery continues to be delayed/deferred  due to chronic colonization with group B strep    LOS: 2 days    [facility] 05/26/2017

## 2017-05-26 NOTE — Evaluation (Signed)
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 The patient has been nauseous and vomiting all day. Emesis has been nonbloody nonbilious. The patient has not had fevers. CT of the abdomen revealed evidence of pyelonephritis. Blood cultures were obtained prior to initiation of antibiotics and the urgency department staff called the hospitalist service for admission. Pt now admitted for UTI, pyelonephritis, and sepsis.   Clinical Impression  Pt admitted with above diagnosis. Pt currently with functional limitations due to the deficits listed below (see PT Problem List).  Pt reports decline in her mobility and strength with recent illness. She is modified independent with bed mobility and CGA for transfers and ambulation.  She is able to ambulate a full lap around RN station with therapist. She reports mild DOE and VSS with SaO2>90% on 4L/min O2. Higher level balance deficits noted in narrow and single leg stance. Gait speed is decreased but functional for limited community mobility. Recommend OP PT for balance and strengthening but pt currently declines further PT following discharge. She would benefit from a rolling walker for added stability and agrees to this. Pt will benefit from PT services to address deficits in strength, balance, and mobility in order to return to full function at home.      Follow Up Recommendations Other (comment);Outpatient PT (Pt currently refuses PT following discharge)    Equipment Recommendations  Rolling walker with 5" wheels    Recommendations for Other  Services       Precautions / Restrictions Precautions Precautions: Fall Restrictions Weight Bearing Restrictions: No      Mobility  Bed Mobility Overal bed mobility: Modified Independent             General bed mobility comments: HOB elevated, minimal use of bed rails. Slight increase in time but functional  Transfers Overall transfer level: Needs assistance Equipment used: Rolling walker (2 wheeled) Transfers: Sit to/from Stand Sit to Stand: Min guard         General transfer comment: Pt demonstrates fair speed/stability with transfers. Safe hand placement. Slight increase in time but [facility]   Ambulation/Gait Ambulation/Gait assistance: Min guard Ambulation Distance (Feet): 220 Feet Assistive device: Rolling walker (2 wheeled) Gait Pattern/deviations: Step-through pattern Gait velocity: Decreased but functional for limited community mobility   General Gait Details: Pt able to ambulate a full lap around RN station with therapist. She reports mild DOE and VSS with SaO2>90% on 4L/min O2. Gait speed is decreased but functional for limited community mobility.   Stairs            Wheelchair Mobility    Modified Rankin (Stroke Patients Only)       Balance Overall balance assessment: Needs assistance Sitting-balance support: No upper extremity supported Sitting balance-Leahy Scale: Good     Standing balance support: No upper extremity supported Standing balance-Leahy Scale: Fair Standing balance comment: Pt able to maintain wide and narrow stance without UE support. Increased sway with Rhomberg testing. Single leg balance is less than 2 seconds bilaterally  Pertinent Vitals/Pain Pain Assessment: 0-10 Pain Score: 8  Pain Location: "All over my body." Pt complains of chronic widespread pain Pain Intervention(s): Monitored during session;Other (comment) (Pt encouraged to contact RN if she needs pain medication)    Home  Living Family/patient expects to be discharged to:: Private residence Living Arrangements: Spouse/significant other Available Help at Discharge: Family Type of Home: House Home Access: Stairs to enter Entrance Stairs-Rails: Left Entrance Stairs-Number of Steps: 4 Home Layout: Multi-level;Bed/bath upstairs Home Equipment: Cane - single point (no walker or BSC)      Prior Function Level of Independence: Independent         Comments: Pt reports she ambulates with prn use of spc. She is independent with ADLs/IADLs     Hand Dominance   Dominant Hand: Right    Extremity/Trunk Assessment   Upper Extremity Assessment Upper Extremity Assessment: Overall WFL for tasks assessed    Lower Extremity Assessment Lower Extremity Assessment: Overall WFL for tasks assessed       Communication   Communication: No difficulties  Cognition Arousal/Alertness: Awake/alert Behavior During Therapy: WFL for tasks assessed/performed Overall Cognitive Status: Within Functional Limits for tasks assessed                                        General Comments      Exercises     Assessment/Plan    PT Assessment Patient needs continued PT services  PT Problem List Decreased strength;Decreased activity tolerance;Decreased balance;Decreased mobility       PT Treatment Interventions DME instruction;Gait training;Stair training;Therapeutic activities;Therapeutic exercise;Balance training;Neuromuscular re-education;Patient/family education    PT Goals (Current goals can be found in the Care Plan section)  Acute Rehab PT Goals Patient Stated Goal: Improve strength PT Goal Formulation: With patient/family Time For Goal Achievement: 06/09/17 Potential to Achieve Goals: Good    Frequency Min 2X/week   Barriers to discharge   Bedroom/bathroom on second level    Co-evaluation               AM-PAC PT "6 Clicks" Daily Activity  Outcome Measure Difficulty turning over  in bed (including adjusting bedclothes, sheets and blankets)?: None Difficulty moving from lying on back to sitting on the side of the bed? : A Little Difficulty sitting down on and standing up from a chair with arms (e.g., wheelchair, bedside commode, etc,.)?: A Little Help needed moving to and from a bed to chair (including a wheelchair)?: None Help needed walking in hospital room?: None Help needed climbing 3-5 steps with a railing? : A Little 6 Click Score: 21    End of Session Equipment Utilized During Treatment: Gait belt Activity Tolerance: Patient tolerated treatment well Patient left: in bed;with call bell/phone within reach;with nursing/sitter in room;with family/visitor present Nurse Communication: Mobility status PT Visit Diagnosis: Unsteadiness on feet (R26.81);Muscle weakness (generalized) (M62.81);Difficulty in walking, not elsewhere classified (R26.2)    Time: 9518-8416 PT Time Calculation (min) (ACUTE ONLY): 26 min   Charges:   PT Evaluation $PT Eval Low Complexity: 1 Low PT Treatments $Gait Training: 8-22 mins   PT G Codes:   PT G-Codes **NOT FOR INPATIENT CLASS** Functional Assessment Tool Used: AM-PAC 6 Clicks Basic Mobility Functional Limitation: Mobility: Walking and moving around Mobility: Walking and Moving Around Current Status (S0630): At least 20 percent but less than 40 percent impaired, limited or restricted Mobility: Walking and Moving Around Goal Status [phone]):  At least 1 percent but less than 20 percent impaired, limited or restricted    Phillips Grout PT, DPT    Huprich,Jason 05/26/2017, 2:16 PM

## 2017-05-26 NOTE — Care Management Note (Signed)
Case Management Note  Patient Details  Name: [name] Branch MRN: [id number] Date of Birth: Aug 19, 1956  Subjective/Objective:         Admitted to [facility] with the diagnosis of pyelonephritis. Lives with husband, Collier Salina [phone]). Sees Dr. Glori Bickers as primary care physician. Prescriptions are filled at [facility]. No home Health. No skilled facility. No home oxygen. Uses a cane to aid in ambulation. Takes care of all basic and instrumental activities of daily living herself, Can drive, if needed. Last fall was 9 months ago. Fair appetite. Husband will drive.            Action/Plan: May need home oxygen at discharge. Will continue to follow   Expected Discharge Date:                  Expected Discharge Plan:     In-House Referral:     Discharge planning Services     Post Acute Care Choice:    Choice offered to:     DME Arranged:    DME Agency:     HH Arranged:    HH Agency:     Status of Service:     If discussed at [employer] of Stay Meetings, dates discussed:    Additional Comments:  Shelbie Ammons, RN MSN CCM Care Management [phone] 05/26/2017, 9:41 AM

## 2017-05-26 NOTE — Progress Notes (Signed)
[place] INFECTIOUS DISEASE PROGRESS NOTE Date of Admission:  05/23/2017     ID: [name] Branch is a 61 y.o. female with GB sterp bacteremia and UTI Active Problems:   Pyelonephritis   Hydronephrosis with obstructing calculus   Subjective: No fevers, feels much better. No back pain, foley out   ROS  Eleven systems are reviewed and negative except per hpi  Medications:  Antibiotics Given (last 72 hours)    Date/Time Action Medication Dose Rate   05/23/17 2226 New Bag/Given   cefTRIAXone (ROCEPHIN) 1 g in dextrose 5 % 50 mL IVPB 1 g 100 mL/hr   05/24/17 1707 New Bag/Given   cefTRIAXone (ROCEPHIN) 2 g in dextrose 5 % 50 mL IVPB 2 g 100 mL/hr   05/25/17 1726 New Bag/Given   cefTRIAXone (ROCEPHIN) 2 g in dextrose 5 % 50 mL IVPB 2 g 100 mL/hr     . bisacodyl  10 mg Rectal Daily  . cholecalciferol  1,000 Units Oral Daily  . docusate sodium  100 mg Oral BID  . enoxaparin (LOVENOX) injection  40 mg Subcutaneous Q24H  . guaiFENesin  600 mg Oral BID  . linaclotide  145 mcg Oral QAC breakfast  . magnesium oxide  400 mg Oral Daily  . mouth rinse  15 mL Mouth Rinse BID  . modafinil  100 mg Oral Daily  . morphine  15 mg Oral Q12H  . multivitamin with minerals  1 tablet Oral Daily  . ondansetron (ZOFRAN) IV  4 mg Intravenous Q6H  . senna  1 tablet Oral BID  . vitamin E  800 Units Oral Daily    Objective: Vital signs in last 24 hours: Temp:  [97.7 F (36.5 C)-99.1 F (37.3 C)] 98.7 F (37.1 C) (08/01 1553) Pulse Rate:  [75-96] 92 (08/01 1553) Resp:  [20] 20 (08/01 0527) BP: (82-122)/(41-63) 101/57 (08/01 1553) SpO2:  [90 %-96 %] 94 % (08/01 1553) FiO2 (%):  [36 %] 36 % (07/31 1616) Weight:  [91.6 kg (201 lb 14.4 oz)] 91.6 kg (201 lb 14.4 oz) (08/01 0530) Physical Exam  Constitutional:  oriented to person, place, and time. appears well-developed and well-nourished. No distress.  HENT: [place]/AT, PERRLA, no scleral icterus Mouth/Throat: Oropharynx is clear and moist. No  oropharyngeal exudate.  Cardiovascular: Normal rate, regular rhythm and normal heart sounds. Exam reveals no gallop and no friction rub.  No murmur heard.  Pulmonary/Chest: Effort normal and breath sounds normal. No respiratory distress.  has no wheezes.  Neck = supple, no nuchal rigidity Abdominal: Soft. Bowel sounds are normal.  exhibits no distension. There is no tenderness.  Lymphadenopathy: no cervical adenopathy. No axillary adenopathy Neurological: alert and oriented to person, place, and time.  Skin: Skin is warm and dry. No rash noted. No erythema.  Psychiatric: a normal mood and affect.  behavior is normal.    Lab Results  Recent Labs  05/24/17 0435 05/25/17 0408 05/26/17 0441  WBC 10.8  --  10.1  HGB 11.3*  --  10.6*  HCT 33.9*  --  31.9*  NA 132* 134* 137  K 3.6 3.9 3.9  CL 100* 101 105  CO2 26 26 28   BUN 16 15 16   CREATININE 1.05* 0.92 0.70    Microbiology: Results for orders placed or performed during the hospital encounter of 05/23/17  Urine culture     Status: Abnormal   Collection Time: 05/23/17  3:29 AM  Result Value Ref Range Status   Specimen Description URINE, RANDOM  Final   Special Requests NONE  Final   Culture (A)  Final    >=100,000 COLONIES/mL GROUP B STREP(S.AGALACTIAE)ISOLATED TESTING AGAINST S. AGALACTIAE NOT ROUTINELY PERFORMED DUE TO PREDICTABILITY OF AMP/PEN/VAN SUSCEPTIBILITY. Performed at [facility], [place] [address], [place], [place] [address]    Report Status 05/24/2017 FINAL  Final  Blood Culture (routine x 2)     Status: Abnormal   Collection Time: 05/23/17  4:55 AM  Result Value Ref Range Status   Specimen Description BLOOD LEFT ANTECUBITAL  Final   Special Requests   Final    BOTTLES DRAWN AEROBIC AND ANAEROBIC Blood Culture adequate volume   Culture  Setup Time   Final    GRAM POSITIVE COCCI IN BOTH AEROBIC AND ANAEROBIC BOTTLES CRITICAL RESULT CALLED TO, READ BACK BY AND VERIFIED WITH: [name] SHOOTER AT 6045 ON  05/23/2017 JJB    Culture (A)  Final    GROUP B STREP(S.AGALACTIAE)ISOLATED SUSCEPTIBILITIES PERFORMED ON PREVIOUS CULTURE WITHIN THE LAST 5 DAYS. Performed at [facility], [place] [address], [place], [place] [address]    Report Status 05/25/2017 FINAL  Final  Blood Culture (routine x 2)     Status: Abnormal   Collection Time: 05/23/17  4:55 AM  Result Value Ref Range Status   Specimen Description BLOOD LEFT FOREARM  Final   Special Requests   Final    BOTTLES DRAWN AEROBIC AND ANAEROBIC Blood Culture adequate volume   Culture  Setup Time   Final    GRAM POSITIVE COCCI IN BOTH AEROBIC AND ANAEROBIC BOTTLES CRITICAL RESULT CALLED TO, READ BACK BY AND VERIFIED WITH: [name] SHOOTER AT 1914 ON 05/23/2017 JJB    Culture GROUP B STREP(S.AGALACTIAE)ISOLATED (A)  Final   Report Status 05/25/2017 FINAL  Final   Organism ID, Bacteria GROUP B STREP(S.AGALACTIAE)ISOLATED  Final      Susceptibility   Group b strep(s.agalactiae)isolated - MIC*    CLINDAMYCIN <=0.25 SENSITIVE Sensitive     AMPICILLIN <=0.25 SENSITIVE Sensitive     ERYTHROMYCIN 2 RESISTANT Resistant     VANCOMYCIN 0.25 SENSITIVE Sensitive     CEFTRIAXONE <=0.12 SENSITIVE Sensitive     LEVOFLOXACIN 1 SENSITIVE Sensitive     * GROUP B STREP(S.AGALACTIAE)ISOLATED  Blood Culture ID Panel (Reflexed)     Status: Abnormal   Collection Time: 05/23/17  4:55 AM  Result Value Ref Range Status   Enterococcus species NOT DETECTED NOT DETECTED Final   Listeria monocytogenes NOT DETECTED NOT DETECTED Final   Staphylococcus species NOT DETECTED NOT DETECTED Final   Staphylococcus aureus NOT DETECTED NOT DETECTED Final   Streptococcus species DETECTED (A) NOT DETECTED Final    Comment: CRITICAL RESULT CALLED TO, READ BACK BY AND VERIFIED WITH: [name] SHOOTER AT 1727 ON 05/23/2017 JJB    Streptococcus agalactiae DETECTED (A) NOT DETECTED Final    Comment: CRITICAL RESULT CALLED TO, READ BACK BY AND VERIFIED WITH: [name] SHOOTER  AT 1727 ON 05/23/2017 JJB    Streptococcus pneumoniae NOT DETECTED NOT DETECTED Final   Streptococcus pyogenes NOT DETECTED NOT DETECTED Final   Acinetobacter baumannii NOT DETECTED NOT DETECTED Final   Enterobacteriaceae species NOT DETECTED NOT DETECTED Final   Enterobacter cloacae complex NOT DETECTED NOT DETECTED Final   Escherichia coli NOT DETECTED NOT DETECTED Final   Klebsiella oxytoca NOT DETECTED NOT DETECTED Final   Klebsiella pneumoniae NOT DETECTED NOT DETECTED Final   Proteus species NOT DETECTED NOT DETECTED Final   Serratia marcescens NOT DETECTED NOT DETECTED Final  Haemophilus influenzae NOT DETECTED NOT DETECTED Final   Neisseria meningitidis NOT DETECTED NOT DETECTED Final   Pseudomonas aeruginosa NOT DETECTED NOT DETECTED Final   Candida albicans NOT DETECTED NOT DETECTED Final   Candida glabrata NOT DETECTED NOT DETECTED Final   Candida krusei NOT DETECTED NOT DETECTED Final   Candida parapsilosis NOT DETECTED NOT DETECTED Final   Candida tropicalis NOT DETECTED NOT DETECTED Final  CULTURE, BLOOD (ROUTINE X 2) w Reflex to ID Panel     Status: None (Preliminary result)   Collection Time: 05/25/17  3:03 PM  Result Value Ref Range Status   Specimen Description BLOOD BLOOD RIGHT HAND  Final   Special Requests   Final    BOTTLES DRAWN AEROBIC AND ANAEROBIC Blood Culture adequate volume   Culture NO GROWTH 2 DAYS  Final   Report Status PENDING  Incomplete  CULTURE, BLOOD (ROUTINE X 2) w Reflex to ID Panel     Status: None (Preliminary result)   Collection Time: 05/25/17  3:10 PM  Result Value Ref Range Status   Specimen Description BLOOD BLOOD LEFT HAND  Final   Special Requests   Final    BOTTLES DRAWN AEROBIC AND ANAEROBIC Blood Culture adequate volume   Culture NO GROWTH 2 DAYS  Final   Report Status PENDING  Incomplete    Studies/Results: Dg Chest 2 View  Result Date: 05/26/2017 CLINICAL DATA:  Right upper quadrant pain. Shortness of breath. Dyspnea. EXAM:  CHEST  2 VIEW COMPARISON:  01/30/2016 FINDINGS: There is pulmonary vascular congestion with patchy faint bilateral pulmonary infiltrates with atelectasis at the bases. There are small bilateral pleural effusions. No acute bone abnormality. Accentuation of the thoracic kyphosis is chronic. Overall heart size is normal although increased since the prior exam. IMPRESSION: New pulmonary vascular congestion with hazy infiltrates bilaterally suggesting atypical pulmonary edema. New bilateral pleural effusions. Enlargement of the cardiac silhouette but still within normal limits. Electronically Signed   By: Lorriane Shire M.D.   On: 05/26/2017 15:10   Ct Renal Stone Study  Result Date: 05/25/2017 CLINICAL DATA:  Fever and right flank pain. Nausea and vomiting. Right hydronephrosis. EXAM: CT ABDOMEN AND PELVIS WITHOUT CONTRAST TECHNIQUE: Multidetector CT imaging of the abdomen and pelvis was performed following the standard protocol without IV contrast. COMPARISON:  CT scan dated 02/24/2016 and ultrasound dated 05/24/2017 FINDINGS: Lower chest: There is moderate bibasilar atelectasis, right greater than left, with tiny bilateral effusions. Hepatobiliary: No focal liver abnormality is seen. Status post cholecystectomy. No biliary dilatation. Pancreas: Unremarkable. No pancreatic ductal dilatation or surrounding inflammatory changes. Spleen: Normal in size without focal abnormality. Adrenals/Urinary Tract: 8 mm stone obstructing the proximal right ureter at the ureteropelvic junction creating right hydronephrosis and right perinephric soft tissue stranding. Distal right ureter is normal. Left kidney and ureter appear normal. Adrenal glands are normal.  Bladder is normal. Stomach/Bowel: Prominent diverticulum in the second portion of the duodenum. The bowel is otherwise normal. Vascular/Lymphatic: Aortic atherosclerosis. No enlarged abdominal or pelvic lymph nodes. Reproductive: Uterus has been removed.  Ovaries are  normal. Other: Small periumbilical hernia containing only fat. Musculoskeletal: No acute or significant osseous findings. IMPRESSION: 1. 8 mm stone obstructing the proximal right ureter creating right hydronephrosis and perinephric soft tissue stranding. 2. Moderate bibasilar atelectasis with tiny effusions. 3. Aortic atherosclerosis. Electronically Signed   By: Lorriane Shire M.D.   On: 05/25/2017 09:28    Assessment/Plan: [name] Branch is a 61 y.o. female with obstructive nephrolithiasis and pyelonephritis with Grp  B strep bacteremia. Clinically improving since stent placement 7/31 with findings of cloudy infected urine. UCX also with GB strep.   Recommendations Continue ceftraixone while inpatient for ease of dosing but at DC would send on oral penicillin VK 500 mg QID for a total 21 day course of antibiotics given complicated pyelonephritis with bacteremia I can see towards end of Treatment course  Thank you very much for the consult. Will follow with you.  Alaira Level P   05/26/2017, 4:12 PM

## 2017-05-27 LAB — BASIC METABOLIC PANEL
ANION GAP: 7 (ref 5–15)
BUN: 18 mg/dL (ref 6–20)
CALCIUM: 8.6 mg/dL — AB (ref 8.9–10.3)
CO2: 29 mmol/L (ref 22–32)
CREATININE: 0.67 mg/dL (ref 0.44–1.00)
Chloride: 102 mmol/L (ref 101–111)
GFR calc Af Amer: >60 mL/min (ref >60)
GLUCOSE: 98 mg/dL (ref 65–99)
Potassium: 3.6 mmol/L (ref 3.5–5.1)
Sodium: 138 mmol/L (ref 135–145)

## 2017-05-27 MED ORDER — FUROSEMIDE 10 MG/ML IJ SOLN
40.0000 mg | INTRAMUSCULAR | Status: AC
  Administered 2017-05-27: 40 mg via INTRAVENOUS
  Filled 2017-05-27: qty 4

## 2017-05-27 MED ORDER — FUROSEMIDE 20 MG PO TABS: 20.0000 mg | ORAL_TABLET | Freq: Every day | ORAL | 0 refills | Status: DC | PRN

## 2017-05-27 MED ORDER — FUROSEMIDE 10 MG/ML IJ SOLN: 40.0000 mg | Freq: Once | INTRAMUSCULAR | Status: DC

## 2017-05-27 MED ORDER — PENICILLIN V POTASSIUM 500 MG PO TABS: 500.0000 mg | ORAL_TABLET | Freq: Four times a day (QID) | ORAL | 0 refills | Status: DC

## 2017-05-27 MED ORDER — POTASSIUM CHLORIDE CRYS ER 20 MEQ PO TBCR
40.0000 meq | EXTENDED_RELEASE_TABLET | Freq: Once | ORAL | Status: AC
  Administered 2017-05-27: 15:00:00 40 meq via ORAL
  Filled 2017-05-27: qty 2

## 2017-05-27 MED ORDER — POTASSIUM CHLORIDE ER 10 MEQ PO TBCR: 10.0000 meq | EXTENDED_RELEASE_TABLET | Freq: Every day | ORAL | 0 refills | Status: DC | PRN

## 2017-05-27 NOTE — Discharge Instructions (Signed)
Your home medications list was not updated here- so please continue to take your medications like prescribed and add the antibiotics now Lasix PRN for any fluid/swelling

## 2017-05-27 NOTE — Progress Notes (Signed)
Patient refused STAT Lasix ordered this morning by Dr. Tressia Miners. Pt educated and verbalized understanding but "will not take it until she speaks to the doctor herself". VSS. Pt weaned to 3L, will continue to wean as tolerated. Will continue to monitor.

## 2017-05-27 NOTE — Progress Notes (Signed)
[place] [facility] [place] NAME: [name] Branch    MR#:  [id number]  DATE OF BIRTH:  1955-12-08  SUBJECTIVE:  CHIEF COMPLAINT:   Chief Complaint  Patient presents with  . Nausea  . Abdominal Pain   - Feels better today except that she did not sleep last night due to melatonin not being on her medication list. -Currently down to 2 L nasal cannula. Refused Lasix this morning  REVIEW OF SYSTEMS:  Review of Systems  Constitutional: Negative for chills, fever and malaise/fatigue.  HENT: Negative for congestion, ear discharge, hearing loss and nosebleeds.   Eyes: Negative for blurred vision and double vision.  Respiratory: Positive for cough. Negative for shortness of breath and wheezing.   Cardiovascular: Negative for chest pain and palpitations.  Gastrointestinal: Negative for abdominal pain, constipation, diarrhea, nausea and vomiting.  Genitourinary: Negative for dysuria.  Musculoskeletal: Positive for back pain and myalgias.  Neurological: Negative for dizziness, sensory change, speech change, focal weakness, seizures and headaches.  Psychiatric/Behavioral: Negative for depression.    DRUG ALLERGIES:   Allergies  Allergen Reactions  . Sulfonamide Derivatives Nausea And Vomiting    VITALS:  Blood pressure (!) 117/51, pulse 100, temperature (!) 100.7 F (38.2 C), temperature source Oral, resp. rate 20, height 5\' 6"  (1.676 m), weight 87.6 kg (193 lb 2 oz), SpO2 93 %.  PHYSICAL EXAMINATION:  Physical Exam  GENERAL:  61 y.o.-year-old patient lying in the bed, not in acute distress EYES: Pupils equal, round, reactive to light and accommodation. No scleral icterus. Extraocular muscles intact.  HEENT: Head atraumatic, normocephalic. Oropharynx and nasopharynx clear.  NECK:  Supple, no jugular venous distention. No thyroid enlargement, no tenderness.  LUNGS: Normal breath sounds bilaterally, no wheezing,rhonchi or crepitation. Fine  bibasilar crackles are present. No use of accessory muscles of respiration.  CARDIOVASCULAR: S1, S2 normal. No murmurs, rubs, or gallops.  ABDOMEN: Soft, nontender,, nondistended. Bowel sounds present. No organomegaly or mass.  EXTREMITIES: No pedal edema, cyanosis, or clubbing.  NEUROLOGIC: Cranial nerves II through XII are intact. Muscle strength 5/5 in all extremities. Sensation intact. Gait not checked.  PSYCHIATRIC: The patient is alert and oriented x 3. SKIN: No obvious rash, lesion, or ulcer.    LABORATORY PANEL:   CBC  Recent Labs Lab 05/26/17 0441  WBC 10.1  HGB 10.6*  HCT 31.9*  PLT 127*   ------------------------------------------------------------------------------------------------------------------  Chemistries   Recent Labs Lab 05/25/17 0408  05/27/17 0325  NA 134*  < > 138  K 3.9  < > 3.6  CL 101  < > 102  CO2 26  < > 29  GLUCOSE 128*  < > 98  BUN 15  < > 18  CREATININE 0.92  < > 0.67  CALCIUM 8.3*  < > 8.6*  AST 36  --   --   ALT 24  --   --   ALKPHOS 89  --   --   BILITOT 0.8  --   --   < > = values in this interval not displayed. ------------------------------------------------------------------------------------------------------------------  Cardiac Enzymes No results for input(s): TROPONINI in the last 168 hours. ------------------------------------------------------------------------------------------------------------------  RADIOLOGY:  Dg Chest 2 View  Result Date: 05/26/2017 CLINICAL DATA:  Right upper quadrant pain. Shortness of breath. Dyspnea. EXAM: CHEST  2 VIEW COMPARISON:  01/30/2016 FINDINGS: There is pulmonary vascular congestion with patchy faint bilateral pulmonary infiltrates with atelectasis at the bases. There are small bilateral pleural effusions. No acute bone  abnormality. Accentuation of the thoracic kyphosis is chronic. Overall heart size is normal although increased since the prior exam. IMPRESSION: New pulmonary vascular  congestion with hazy infiltrates bilaterally suggesting atypical pulmonary edema. New bilateral pleural effusions. Enlargement of the cardiac silhouette but still within normal limits. Electronically Signed   By: Lorriane Shire M.D.   On: 05/26/2017 15:10    EKG:   Orders placed or performed during the hospital encounter of 05/23/17  . EKG 12-Lead  . EKG 12-Lead    ASSESSMENT AND PLAN:   61 year old female with past medical history secondary to chronic fatigue syndrome, chronic pain syndrome, pelvic prolapse, IBS, gastritis presents to the hospital secondary to worsening left flank pain, nausea and vomiting. Noted to have acute cystitis and possible left pyelonephritis.  #1 Sepsis- secondary to acute cystitis-left pyelonephritis -Due to persistent fevers, CT abd ordered showing obstructing right ureteral calculus-Appreciate urology consult. Status post right ureteral stent placement. -Foley discontinued. Patient feels much better. No further fevers -UA strongly positive for infection, urine cultures and blood cultures are growing group B strep- on rocephin-appreciate ID consult. -Repeat blood cultures are negative. ECHO with no vegetations. - At discharge will change to oral penicillin for 3 weeks  #2 chronic pain syndrome-on MS Contin. And other oral pain medications and IV prn  #3 chronic constipation-also history of rectal prolapse. Continue laxatives.  #4 DVT prophylaxis-Lovenox  #5 Narcolepsy- restarted provigil  #6 acute pulmonary edema-no history of CHF, echo with normal ejection fraction. -Has minimal fatty liver disease and has tendency for fluid accumulation. Could be iatrogenic and from all the IV fluids given to her on admission. -One dose of Lasix today. Continue to wean oxygen. Was on 4 L this morning and at 2 L now. hypoxia- -History of COPD but not on home oxygen.  -Continue to wean oxygen as tolerated. Encouraged to do incentive spirometry. Lasix when necessary will  be given at discharge  Husband updated at bedside Patient very anxious to leave today. Continue to be an oxygen, if doing well on room air can be discharged today. If not, she will be discharged tomorrow   All the records are reviewed and case discussed with Care Management/Social Workerr. Management plans discussed with the patient, family and they are in agreement.  CODE STATUS: Full code  TOTAL TIME TAKING CARE OF THIS PATIENT: 37 minutes.   POSSIBLE D/C today or tomorrow, DEPENDING ON CLINICAL CONDITION.   Taylen Osorto M.D on 05/27/2017 at 3:04 PM  Between 7am to 6pm - Pager - [phone]  After 6pm go to [email or web address] - password [email or web address] [facility]  Office  [phone]  CC: Primary care physician; Tower, Wynelle Fanny, MD

## 2017-05-27 NOTE — Care Management (Addendum)
Patient is currently on 4 liters of oxygen per nasal cannula and this is not chronic per report.  There is not a chronic cardiopulmonary diagnosis documented.  Patient acknowledges that she has never been "officially diagnosed" with a chronic cardiopulmonary condition.  discussed if she requires home oxygen, it may not be covered under her insurance.  Attempts are in progress to wean oxygen.  Discussed with primary nurse and patient of home oxygen assessment prior to discharge.

## 2017-05-28 ENCOUNTER — Inpatient Hospital Stay: Payer: 59

## 2017-05-28 MED ORDER — ONDANSETRON 4 MG PO TBDP: 4.0000 mg | ORAL_TABLET | Freq: Three times a day (TID) | ORAL | 0 refills | Status: DC | PRN

## 2017-05-28 MED ORDER — POTASSIUM CHLORIDE CRYS ER 20 MEQ PO TBCR
40.0000 meq | EXTENDED_RELEASE_TABLET | Freq: Once | ORAL | Status: AC
  Administered 2017-05-28: 10:00:00 40 meq via ORAL
  Filled 2017-05-28: qty 2

## 2017-05-28 MED ORDER — FUROSEMIDE 10 MG/ML IJ SOLN: 40.0000 mg | Freq: Once | INTRAMUSCULAR | Status: DC

## 2017-05-28 NOTE — Discharge Planning (Signed)
Patient IV removed.  Discharge papers given, explained and educated.  RN assessment and VS revealed stability for DC to home.  Informed of suggested FU appt and apt made.  Scripts e-scribed to [employer].  Once ready, patient will be wheeled to front and family transporting home via car.

## 2017-05-28 NOTE — Discharge Summary (Signed)
[facility] NAME: [name] Branch    MR#:  [id number]  DATE OF BIRTH:  1956-01-11  DATE OF ADMISSION:  05/23/2017   ADMITTING PHYSICIAN: Harrie Foreman, MD  DATE OF DISCHARGE: 05/28/17  PRIMARY CARE PHYSICIAN: Tower, Wynelle Fanny, MD   ADMISSION DIAGNOSIS:   Pyelonephritis [N12]  DISCHARGE DIAGNOSIS:   Active Problems:   Pyelonephritis   Hydronephrosis with obstructing calculus   SECONDARY DIAGNOSIS:   Past Medical History:  Diagnosis Date  . Allergic rhinitis   . 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 Dr. Cristy Friedlander   . Chronic gastritis   . Hepatic steatosis   . Hyperlipidemia   . IBS (irritable bowel syndrome)   . Lyme disease    "stari"  . Migraine   . Narcolepsy   . Neuromuscular disorder ([place])   . Rectal prolapse   . Serrated adenoma of colon   . Umbilical hernia   . Vaginal prolapse   . West Nile Virus infection 2003    HOSPITAL COURSE:   61 year old female with past medical history secondary to chronic fatigue syndrome, chronic pain syndrome, pelvic prolapse, IBS, gastritis presents to the hospital secondary to worsening left flank pain, nausea and vomiting. Noted to have acute cystitis and possible left pyelonephritis.  #1 Sepsis- secondary to acute cystitis-left pyelonephritis -Due to persistent fevers, CT abd ordered showing obstructing right ureteral calculus-Appreciate urology consult. Status post right ureteral stent placement. -Patient feels much better. No further fevers -UA strongly positive for infection, urine cultures and blood cultures are growing group B strep- on rocephin-appreciate ID consult. -Repeat blood cultures are negative. ECHO with no vegetations. - At discharge will change to oral penicillin for 3 weeks  #2 chronic pain syndrome-on MS Contin. And other oral pain medications and IV prn  #3 chronic  constipation-also history of rectal prolapse. Continue laxatives.  #4 Narcolepsy- restarted provigil  #5 acute pulmonary edema-no history of CHF, echo with normal ejection fraction. -Has minimal fatty liver disease and has tendency for fluid accumulation. Could be iatrogenic and from all the IV fluids given to her on admission. -One dose of Lasix received. Continue to wean oxygen. Was on 4 L this morning and now on room air this AM, ambulate and check sats -History of COPD but not on home oxygen.  -Encouraged to do incentive spirometry. Lasix when necessary will be given at discharge  Will be discharged today   DISCHARGE CONDITIONS:   Guarded  CONSULTS OBTAINED:   Treatment Team:  Leonel Ramsay, MD Festus Aloe, MD Hollice Espy, MD  DRUG ALLERGIES:   Allergies  Allergen Reactions  . Sulfonamide Derivatives Nausea And Vomiting   DISCHARGE MEDICATIONS:   Allergies as of 05/28/2017      Reactions   Sulfonamide Derivatives Nausea And Vomiting      Medication List    STOP taking these medications   promethazine 25 MG tablet Commonly known as:  PHENERGAN     TAKE these medications   furosemide 20 MG tablet Commonly known as:  LASIX Take 1 tablet (20 mg total) by mouth daily as needed.   morphine 30 MG tablet Commonly known as:  MSIR Take 30 mg by mouth every 3 (three) hours as needed for severe pain. UAD PRN   penicillin v potassium 500 MG tablet Commonly known as:  VEETID Take 1 tablet (500 mg total) by mouth 4 (  four) times daily. X 21 days   potassium chloride 10 MEQ tablet Commonly known as:  K-DUR Take 1 tablet (10 mEq total) by mouth daily as needed (while on lasix).        DISCHARGE INSTRUCTIONS:   1. PCP f/u in 1-2 weeks  DIET:   Low sodium diet  ACTIVITY:   Activity as tolerated  OXYGEN:   Home Oxygen: No.  Oxygen Delivery: room air  DISCHARGE LOCATION:   home   If you experience worsening of your admission  symptoms, develop shortness of breath, life threatening emergency, suicidal or homicidal thoughts you must seek medical attention immediately by calling 911 or calling your MD immediately  if symptoms less severe.  You Must read complete instructions/literature along with all the possible adverse reactions/side effects for all the Medicines you take and that have been prescribed to you. Take any new Medicines after you have completely understood and accpet all the possible adverse reactions/side effects.   Please note  You were cared for by a hospitalist during your hospital stay. If you have any questions about your discharge medications or the care you received while you were in the hospital after you are discharged, you can call the unit and asked to speak with the hospitalist on call if the hospitalist that took care of you is not available. Once you are discharged, your primary care physician will handle any further medical issues. Please note that NO REFILLS for any discharge medications will be authorized once you are discharged, as it is imperative that you return to your primary care physician (or establish a relationship with a primary care physician if you do not have one) for your aftercare needs so that they can reassess your need for medications and monitor your lab values.    On the day of Discharge:  VITAL SIGNS:   Blood pressure (!) 117/58, pulse 88, temperature 98.8 F (37.1 C), temperature source Oral, resp. rate (!) 21, height 5\' 6"  (1.676 m), weight 85.8 kg (189 lb 1 oz), SpO2 91 %.  PHYSICAL EXAMINATION:    GENERAL:  61 y.o.-year-old patient lying in the bed, not in acute distress EYES: Pupils equal, round, reactive to light and accommodation. No scleral icterus. Extraocular muscles intact.  HEENT: Head atraumatic, normocephalic. Oropharynx and nasopharynx clear.  NECK:  Supple, no jugular venous distention. No thyroid enlargement, no tenderness.  LUNGS: Normal breath  sounds bilaterally, no wheezing,rhonchi or crepitation. Fine right basilar crackles are present. No use of accessory muscles of respiration.  CARDIOVASCULAR: S1, S2 normal. No murmurs, rubs, or gallops.  ABDOMEN: Soft, nontender,, nondistended. Bowel sounds present. No organomegaly or mass.  EXTREMITIES: No pedal edema, cyanosis, or clubbing.  NEUROLOGIC: Cranial nerves II through XII are intact. Muscle strength 5/5 in all extremities. Sensation intact. Gait not checked.  PSYCHIATRIC: The patient is alert and oriented x 3. SKIN: No obvious rash, lesion, or ulcer.   DATA REVIEW:   CBC  Recent Labs Lab 05/26/17 0441  WBC 10.1  HGB 10.6*  HCT 31.9*  PLT 127*    Chemistries   Recent Labs Lab 05/25/17 0408  05/27/17 0325  NA 134*  < > 138  K 3.9  < > 3.6  CL 101  < > 102  CO2 26  < > 29  GLUCOSE 128*  < > 98  BUN 15  < > 18  CREATININE 0.92  < > 0.67  CALCIUM 8.3*  < > 8.6*  AST 36  --   --  ALT 24  --   --   ALKPHOS 89  --   --   BILITOT 0.8  --   --   < > = values in this interval not displayed.   Microbiology Results  Results for orders placed or performed during the hospital encounter of 05/23/17  Urine culture     Status: Abnormal   Collection Time: 05/23/17  3:29 AM  Result Value Ref Range Status   Specimen Description URINE, RANDOM  Final   Special Requests NONE  Final   Culture (A)  Final    >=100,000 COLONIES/mL GROUP B STREP(S.AGALACTIAE)ISOLATED TESTING AGAINST S. AGALACTIAE NOT ROUTINELY PERFORMED DUE TO PREDICTABILITY OF AMP/PEN/VAN SUSCEPTIBILITY. Performed at [facility], [place] [address] [place], [place] [address]    Report Status 05/24/2017 FINAL  Final  Blood Culture (routine x 2)     Status: Abnormal   Collection Time: 05/23/17  4:55 AM  Result Value Ref Range Status   Specimen Description BLOOD LEFT ANTECUBITAL  Final   Special Requests   Final    BOTTLES DRAWN AEROBIC AND ANAEROBIC Blood Culture adequate volume   Culture  Setup Time    Final    GRAM POSITIVE COCCI IN BOTH AEROBIC AND ANAEROBIC BOTTLES CRITICAL RESULT CALLED TO, READ BACK BY AND VERIFIED WITH: [name] SHOOTER AT 4401 ON 05/23/2017 JJB    Culture (A)  Final    GROUP B STREP(S.AGALACTIAE)ISOLATED SUSCEPTIBILITIES PERFORMED ON PREVIOUS CULTURE WITHIN THE LAST 5 DAYS. Performed at [facility], [place] [address] [place], [place] [address]    Report Status 05/25/2017 FINAL  Final  Blood Culture (routine x 2)     Status: Abnormal   Collection Time: 05/23/17  4:55 AM  Result Value Ref Range Status   Specimen Description BLOOD LEFT FOREARM  Final   Special Requests   Final    BOTTLES DRAWN AEROBIC AND ANAEROBIC Blood Culture adequate volume   Culture  Setup Time   Final    GRAM POSITIVE COCCI IN BOTH AEROBIC AND ANAEROBIC BOTTLES CRITICAL RESULT CALLED TO, READ BACK BY AND VERIFIED WITH: [name] SHOOTER AT 3664 ON 05/23/2017 JJB    Culture GROUP B STREP(S.AGALACTIAE)ISOLATED (A)  Final   Report Status 05/25/2017 FINAL  Final   Organism ID, Bacteria GROUP B STREP(S.AGALACTIAE)ISOLATED  Final      Susceptibility   Group b strep(s.agalactiae)isolated - MIC*    CLINDAMYCIN <=0.25 SENSITIVE Sensitive     AMPICILLIN <=0.25 SENSITIVE Sensitive     ERYTHROMYCIN 2 RESISTANT Resistant     VANCOMYCIN 0.25 SENSITIVE Sensitive     CEFTRIAXONE <=0.12 SENSITIVE Sensitive     LEVOFLOXACIN 1 SENSITIVE Sensitive     * GROUP B STREP(S.AGALACTIAE)ISOLATED  Blood Culture ID Panel (Reflexed)     Status: Abnormal   Collection Time: 05/23/17  4:55 AM  Result Value Ref Range Status   Enterococcus species NOT DETECTED NOT DETECTED Final   Listeria monocytogenes NOT DETECTED NOT DETECTED Final   Staphylococcus species NOT DETECTED NOT DETECTED Final   Staphylococcus aureus NOT DETECTED NOT DETECTED Final   Streptococcus species DETECTED (A) NOT DETECTED Final    Comment: CRITICAL RESULT CALLED TO, READ BACK BY AND VERIFIED WITH: [name] SHOOTER AT 1727 ON  05/23/2017 JJB    Streptococcus agalactiae DETECTED (A) NOT DETECTED Final    Comment: CRITICAL RESULT CALLED TO, READ BACK BY AND VERIFIED WITH: [name] SHOOTER AT 1727 ON 05/23/2017 JJB    Streptococcus pneumoniae NOT DETECTED NOT DETECTED Final  Streptococcus pyogenes NOT DETECTED NOT DETECTED Final   Acinetobacter baumannii NOT DETECTED NOT DETECTED Final   Enterobacteriaceae species NOT DETECTED NOT DETECTED Final   Enterobacter cloacae complex NOT DETECTED NOT DETECTED Final   Escherichia coli NOT DETECTED NOT DETECTED Final   Klebsiella oxytoca NOT DETECTED NOT DETECTED Final   Klebsiella pneumoniae NOT DETECTED NOT DETECTED Final   Proteus species NOT DETECTED NOT DETECTED Final   Serratia marcescens NOT DETECTED NOT DETECTED Final   Haemophilus influenzae NOT DETECTED NOT DETECTED Final   Neisseria meningitidis NOT DETECTED NOT DETECTED Final   Pseudomonas aeruginosa NOT DETECTED NOT DETECTED Final   Candida albicans NOT DETECTED NOT DETECTED Final   Candida glabrata NOT DETECTED NOT DETECTED Final   Candida krusei NOT DETECTED NOT DETECTED Final   Candida parapsilosis NOT DETECTED NOT DETECTED Final   Candida tropicalis NOT DETECTED NOT DETECTED Final  CULTURE, BLOOD (ROUTINE X 2) w Reflex to ID Panel     Status: None (Preliminary result)   Collection Time: 05/25/17  3:03 PM  Result Value Ref Range Status   Specimen Description BLOOD BLOOD RIGHT HAND  Final   Special Requests   Final    BOTTLES DRAWN AEROBIC AND ANAEROBIC Blood Culture adequate volume   Culture NO GROWTH 3 DAYS  Final   Report Status PENDING  Incomplete  CULTURE, BLOOD (ROUTINE X 2) w Reflex to ID Panel     Status: None (Preliminary result)   Collection Time: 05/25/17  3:10 PM  Result Value Ref Range Status   Specimen Description BLOOD BLOOD LEFT HAND  Final   Special Requests   Final    BOTTLES DRAWN AEROBIC AND ANAEROBIC Blood Culture adequate volume   Culture NO GROWTH 3 DAYS  Final   Report  Status PENDING  Incomplete    RADIOLOGY:  Dg Chest Port 1 View  Result Date: 05/28/2017 CLINICAL DATA:  Cough for 2-3 days EXAM: PORTABLE CHEST 1 VIEW COMPARISON:  05/26/2017 FINDINGS: Low volume chest with streaky right base opacity where there is diaphragm obscuration and there is volume loss. No edema, effusion, or pneumothorax. The normal heart size and mediastinal contours accounting for rotation. Generalized interstitial coarsening on previous study is improved. IMPRESSION: Low volume chest with atelectasis at the right base. Cannot exclude superimposed infection. Electronically Signed   By: Monte Fantasia M.D.   On: 05/28/2017 07:25     Management plans discussed with the patient, family and they are in agreement.  CODE STATUS:     Code Status Orders        Start     Ordered   05/23/17 0832  Full code  Continuous     05/23/17 0831    Code Status History    Date Active Date Inactive Code Status Order ID Comments User Context   This patient has a current code status but no historical code status.    Advance Directive Documentation     Most Recent Value  Type of Advance Directive  Healthcare Power of Attorney  Pre-existing out of facility DNR order (yellow form or pink MOST form)  -  "MOST" Form in Place?  -      TOTAL TIME TAKING CARE OF THIS PATIENT: 37 minutes.    Antoin Dargis M.D on 05/28/2017 at 9:26 AM  Between 7am to 6pm - Pager - [phone]  After 6pm go to [email or web address] - [employer] [facility]  Office  [phone]  CC: Primary care physician; Tower, [place]  A, MD   Note: This dictation was prepared with Dragon dictation along with smaller phrase technology. Any transcriptional errors that result from this process are unintentional.

## 2017-05-30 LAB — CULTURE, BLOOD (ROUTINE X 2)
Culture: NO GROWTH
Culture: NO GROWTH
SPECIAL REQUESTS: ADEQUATE
Special Requests: ADEQUATE

## 2017-05-31 ENCOUNTER — Telehealth: Payer: Self-pay | Admitting: *Deleted

## 2017-05-31 NOTE — Telephone Encounter (Signed)
If she has specialist appts and the d/c did not indicate that she needs to see me - go ahead and prioritize the specialist appts  Can cancel my appt

## 2017-05-31 NOTE — Telephone Encounter (Signed)
Spoke to pt and attempted to complete TCM. She states she is "doing ok." Per pt, she has upcoming appts with [facility], the physician that performed the surgery, and the pain clinic, and is wanting to know if a f/u with Dr Glori Bickers is necessary. pls advise

## 2017-06-01 DIAGNOSIS — M797 Fibromyalgia: Secondary | ICD-10-CM | POA: Diagnosis not present

## 2017-06-01 DIAGNOSIS — Z8619 Personal history of other infectious and parasitic diseases: Secondary | ICD-10-CM | POA: Diagnosis not present

## 2017-06-01 DIAGNOSIS — N10 Acute tubulo-interstitial nephritis: Secondary | ICD-10-CM | POA: Diagnosis not present

## 2017-06-01 DIAGNOSIS — R5382 Chronic fatigue, unspecified: Secondary | ICD-10-CM | POA: Diagnosis not present

## 2017-06-01 NOTE — Telephone Encounter (Signed)
Spoke to Dr Glori Bickers and advised hosp d/c does indicate to keep appt with her on 8/10 but pt was wanting to see specialist only. Per Dr Glori Bickers, ok to cancel appt and pt can be followed by specialist. Spoke to pt and advised; appt cancelled

## 2017-06-03 ENCOUNTER — Other Ambulatory Visit: Payer: Self-pay | Admitting: Radiology

## 2017-06-03 ENCOUNTER — Encounter: Payer: Self-pay | Admitting: Urology

## 2017-06-03 ENCOUNTER — Ambulatory Visit
Admission: RE | Admit: 2017-06-03 | Discharge: 2017-06-03 | Disposition: A | Discharge status: Home / Self Care | Payer: 59 | Source: Ambulatory Visit | Attending: Urology | Admitting: Urology

## 2017-06-03 ENCOUNTER — Ambulatory Visit (INDEPENDENT_AMBULATORY_CARE_PROVIDER_SITE_OTHER): Payer: 59 | Admitting: Urology

## 2017-06-03 ENCOUNTER — Telehealth: Payer: Self-pay | Admitting: Radiology

## 2017-06-03 VITALS — BP 137/76 | HR 106 | Ht 66.0 in | Wt 183.0 lb

## 2017-06-03 DIAGNOSIS — N12 Tubulo-interstitial nephritis, not specified as acute or chronic: Secondary | ICD-10-CM | POA: Diagnosis present

## 2017-06-03 DIAGNOSIS — N201 Calculus of ureter: Secondary | ICD-10-CM

## 2017-06-03 DIAGNOSIS — Z8619 Personal history of other infectious and parasitic diseases: Secondary | ICD-10-CM

## 2017-06-03 LAB — URINALYSIS, COMPLETE
Bilirubin, UA: NEGATIVE
Glucose, UA: NEGATIVE
Ketones, UA: NEGATIVE
NITRITE UA: NEGATIVE
PH UA: 7 (ref 5.0–7.5)
Specific Gravity, UA: 1.02 (ref 1.005–1.030)
UUROB: 1 mg/dL (ref 0.2–1.0)

## 2017-06-03 LAB — MICROSCOPIC EXAMINATION: RBC, UA: >30 /hpf — ABNORMAL HIGH (ref 0–?)

## 2017-06-03 NOTE — Telephone Encounter (Signed)
Pt scheduled for right ESWL with Dr Erlene Quan on 06/10/17. Pre-op paperwork completed with patient. Instructions given. Questions answered. Pt voices understanding.

## 2017-06-03 NOTE — Progress Notes (Signed)
06/03/2017 3:20 PM   [name] Branch 10/08/1956 [id number]  Referring provider: Abner Greenspan, MD [address] [place], [place] [address]  Chief Complaint  Patient presents with  . Follow-up    discuss surgery option    HPI: 61 year old female who underwent ureteral stent placement on 05/25/2017 for an obstructing 8 x 10 mm right proximal ureteral calculus, fevers to 103, tachycardia. 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 Ultimately, she appears clinically mass discharge. She returns today to discuss definitive management of her stone.  Overall, she's feeling much better today. She does have some frequency and urgency related to her stent. She also has significant pelvic organ prolapse which is symptomatic.  Prior to this episode, no previous stone history. She has no nonobstructing stones.  She is currently on penicillin for a total of a 3 week course per ID.  PMH: Past Medical History:  Diagnosis Date  . Allergic rhinitis   . 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 Dr. Cristy Friedlander   . Chronic gastritis   . Hepatic steatosis   . Hyperlipidemia   . IBS (irritable bowel syndrome)   . Lyme disease    "stari"  . Migraine   . Narcolepsy   . Neuromuscular disorder ([place])   . Rectal prolapse   . Serrated adenoma of colon   . Umbilical hernia   . Vaginal prolapse   . West Nile Virus infection 2003    Surgical History: Past Surgical History:  Procedure Laterality Date  . 5th R toe fx  '09  . BREAST BIOPSY  '04   neg; right  . CHOLECYSTECTOMY  10/2011  . CYSTOSCOPY WITH STENT PLACEMENT Right 05/25/2017   Procedure: CYSTOSCOPY WITH STENT PLACEMENT;  Surgeon: Hollice Espy, MD;  Location: [facility] ORS;  Service: Urology;  Laterality: Right;  . laprascopy    . PARTIAL HYSTERECTOMY     bleeding  . TONSILLECTOMY    . ULNAR NERVE REPAIR     sx times 5  .  UPPER GASTROINTESTINAL ENDOSCOPY  12/04/2004   gastritis    Home Medications:  Allergies as of 06/03/2017      Reactions   Sulfonamide Derivatives Nausea And Vomiting      Medication List       Accurate as of 06/03/17  3:20 PM. Always use your most recent med list.          furosemide 20 MG tablet Commonly known as:  LASIX Take 1 tablet (20 mg total) by mouth daily as needed.   morphine 30 MG tablet Commonly known as:  MSIR Take 30 mg by mouth every 3 (three) hours as needed for severe pain. UAD PRN   ondansetron 4 MG disintegrating tablet Commonly known as:  ZOFRAN ODT Take 1 tablet (4 mg total) by mouth every 8 (eight) hours as needed for nausea or vomiting.   penicillin v potassium 500 MG tablet Commonly known as:  VEETID Take 1 tablet (500 mg total) by mouth 4 (four) times daily. X 21 days   potassium chloride 10 MEQ tablet Commonly known as:  K-DUR Take 1 tablet (10 mEq total) by mouth daily as needed (while on lasix).       Allergies:  Allergies  Allergen Reactions  . Sulfonamide Derivatives Nausea And Vomiting    Family History: Family History  Problem Relation Age of Onset  . Diabetes Father   .  Hypertension Father   . Stroke Father   . Hypertension Mother   . Cancer Mother        [name]  . Ulcers Mother   . Breast cancer Paternal Grandmother   . Bipolar disorder Unknown        some family  . Alcohol abuse Maternal Grandfather   . Colon cancer Neg Hx     Social History:  reports that she has never smoked. She has never used smokeless tobacco. She reports that she does not drink alcohol or use drugs.  ROS: UROLOGY Frequent Urination?: Yes Hard to postpone urination?: Yes Burning/pain with urination?: No Get up at night to urinate?: Yes Leakage of urine?: Yes Urine stream starts and stops?: No Trouble starting stream?: No Do you have to strain to urinate?: Yes Blood in urine?: No Urinary tract infection?: No Sexually transmitted  disease?: No Injury to kidneys or bladder?: No Painful intercourse?: No Weak stream?: No Currently pregnant?: No Vaginal bleeding?: No Last menstrual period?: n  Gastrointestinal Nausea?: Yes Vomiting?: No Indigestion/heartburn?: No Diarrhea?: No Constipation?: No  Constitutional Fever: No Night sweats?: No Weight loss?: No Fatigue?: Yes  Skin Skin rash/lesions?: No Itching?: No  Eyes Blurred vision?: No Double vision?: No  Ears/Nose/Throat Sore throat?: Yes Sinus problems?: Yes  Hematologic/Lymphatic Swollen glands?: No Easy bruising?: Yes  Cardiovascular Leg swelling?: No Chest pain?: No  Respiratory Cough?: Yes Shortness of breath?: Yes  Endocrine Excessive thirst?: No  Musculoskeletal Back pain?: Yes Joint pain?: Yes  Neurological Headaches?: Yes Dizziness?: Yes  Psychologic Depression?: No Anxiety?: No  Physical Exam: BP 137/76   Pulse (!) 106   Ht 5\' 6"  (1.676 m)   Wt 183 lb (83 kg)   BMI 29.54 kg/m   Constitutional:  Alert and oriented, No acute distress. HEENT: [place] AT, moist mucus membranes.  Trachea midline, no masses. Cardiovascular: No clubbing, cyanosis, or edema. Respiratory: Normal respiratory effort, no increased work of breathing. GI: Abdomen is soft, nontender, nondistended, no abdominal masses GU: No CVA tenderness. Skin: No rashes, bruises or suspicious lesions. Neurologic: Grossly intact, no focal deficits, moving all 4 extremities. Ambulating with cane.   Psychiatric: Normal mood and affect.  Laboratory Data: Lab Results  Component Value Date   WBC 10.1 05/26/2017   HGB 10.6 (L) 05/26/2017   HCT 31.9 (L) 05/26/2017   MCV 81.5 05/26/2017   PLT 127 (L) 05/26/2017    Lab Results  Component Value Date   CREATININE 0.67 05/27/2017    Lab Results  Component Value Date   HGBA1C 5.7 (H) 05/23/2017    Urinalysis    Component Value Date/Time   COLORURINE AMBER (A) 05/23/2017 0329   APPEARANCEUR CLOUDY (A)  05/23/2017 0329   LABSPEC 1.014 05/23/2017 0329   PHURINE 7.0 05/23/2017 0329   GLUCOSEU NEGATIVE 05/23/2017 0329   HGBUR NEGATIVE 05/23/2017 0329   HGBUR negative 08/06/2010 1154   BILIRUBINUR NEGATIVE 05/23/2017 0329   BILIRUBINUR Negative 02/18/2016 1149   KETONESUR NEGATIVE 05/23/2017 0329   PROTEINUR NEGATIVE 05/23/2017 0329   UROBILINOGEN 0.2 02/18/2016 1149   UROBILINOGEN 0.2 08/06/2010 1154   NITRITE POSITIVE (A) 05/23/2017 0329   LEUKOCYTESUR MODERATE (A) 05/23/2017 0329    Pertinent Imaging: CT scan from 05/25/17 reviewed personally again today and with the patient  KUB reviewed personally today- stone seen within renal pelvis at tip of stent  Assessment & Plan:    1. Right ureteral stone We discussed various treatment options including ESWL vs. ureteroscopy, laser lithotripsy, and stent.  We discussed the risks and benefits of both including bleeding, infection, damage to surrounding structures, efficacy with need for possible further intervention, and need for temporary ureteral stent.  She is most interested in shock with lithotripsy. She understands that this has a slightly higher failure rate is less invasive. All questions were answered.  - Urinalysis, Complete - CULTURE, URINE COMPREHENSIVE - DG Abd 1 View; Future  2. History of sepsis Continue antibiotics as prescribed   Return for schedule ESWL.  Hollice Espy, MD  [facility] [address], [place], [place] [address] [phone]

## 2017-06-04 ENCOUNTER — Ambulatory Visit: Payer: 59 | Admitting: Family Medicine

## 2017-06-06 LAB — CULTURE, URINE COMPREHENSIVE

## 2017-06-09 ENCOUNTER — Encounter: Payer: Self-pay | Admitting: *Deleted

## 2017-06-10 ENCOUNTER — Ambulatory Visit
Admission: RE | Admit: 2017-06-10 | Discharge: 2017-06-10 | Disposition: A | Discharge status: Home / Self Care | Payer: 59 | Source: Ambulatory Visit | Attending: Urology | Admitting: Urology

## 2017-06-10 ENCOUNTER — Encounter: Payer: Self-pay | Admitting: *Deleted

## 2017-06-10 ENCOUNTER — Ambulatory Visit: Payer: 59

## 2017-06-10 ENCOUNTER — Encounter
Admission: RE | Disposition: A | Discharge status: Home / Self Care | Payer: Self-pay | Source: Ambulatory Visit | Attending: Urology

## 2017-06-10 DIAGNOSIS — N201 Calculus of ureter: Secondary | ICD-10-CM | POA: Insufficient documentation

## 2017-06-10 DIAGNOSIS — Z79899 Other long term (current) drug therapy: Secondary | ICD-10-CM | POA: Diagnosis not present

## 2017-06-10 DIAGNOSIS — E785 Hyperlipidemia, unspecified: Secondary | ICD-10-CM | POA: Diagnosis not present

## 2017-06-10 DIAGNOSIS — Z882 Allergy status to sulfonamides status: Secondary | ICD-10-CM | POA: Insufficient documentation

## 2017-06-10 DIAGNOSIS — Z79891 Long term (current) use of opiate analgesic: Secondary | ICD-10-CM | POA: Insufficient documentation

## 2017-06-10 HISTORY — PX: EXTRACORPOREAL SHOCK WAVE LITHOTRIPSY: SHX1557

## 2017-06-10 HISTORY — DX: Personal history of urinary calculi: Z87.442

## 2017-06-10 SURGERY — LITHOTRIPSY, ESWL
Anesthesia: Moderate Sedation | Laterality: Right

## 2017-06-10 MED ORDER — SODIUM CHLORIDE 0.9 % IV SOLN
INTRAVENOUS | Status: DC
  Administered 2017-06-10: 07:00:00 via INTRAVENOUS

## 2017-06-10 MED ORDER — DIPHENHYDRAMINE HCL 25 MG PO CAPS
25.0000 mg | ORAL_CAPSULE | ORAL | Status: AC
  Administered 2017-06-10: 25 mg via ORAL

## 2017-06-10 MED ORDER — DOCUSATE SODIUM 100 MG PO CAPS: 100.0000 mg | ORAL_CAPSULE | Freq: Two times a day (BID) | ORAL | 0 refills | Status: AC

## 2017-06-10 MED ORDER — OXYCODONE-ACETAMINOPHEN 5-325 MG PO TABS: 1.0000 | ORAL_TABLET | ORAL | 0 refills | Status: DC | PRN

## 2017-06-10 MED ORDER — CIPROFLOXACIN HCL 500 MG PO TABS
500.0000 mg | ORAL_TABLET | ORAL | Status: AC
  Administered 2017-06-10: 500 mg via ORAL

## 2017-06-10 MED ORDER — ONDANSETRON HCL 4 MG/2ML IJ SOLN
4.0000 mg | Freq: Once | INTRAMUSCULAR | Status: AC
  Administered 2017-06-10: 4 mg via INTRAVENOUS

## 2017-06-10 MED ORDER — TAMSULOSIN HCL 0.4 MG PO CAPS: 0.4000 mg | ORAL_CAPSULE | Freq: Every day | ORAL | 0 refills | Status: DC

## 2017-06-10 MED ORDER — DIAZEPAM 5 MG PO TABS
10.0000 mg | ORAL_TABLET | ORAL | Status: AC
  Administered 2017-06-10: 10 mg via ORAL

## 2017-06-10 NOTE — Interval H&P Note (Signed)
History and Physical Interval Note:  06/10/2017 7:38 AM  [name] Branch  has presented today for surgery, with the diagnosis of Kidney stone  The various methods of treatment have been discussed with the patient and family. After consideration of risks, benefits and other options for treatment, the patient has consented to  Procedure(s): EXTRACORPOREAL SHOCK WAVE LITHOTRIPSY (ESWL) (Right) as a surgical intervention .  The patient's history has been reviewed, patient examined, no change in status, stable for surgery.  I have reviewed the patient's chart and labs.  Questions were answered to the patient's satisfaction.    RRR CTAB  Hollice Espy

## 2017-06-10 NOTE — Discharge Instructions (Signed)
AMBULATORY SURGERY  DISCHARGE INSTRUCTIONS   1) The drugs that you were given will stay in your system until tomorrow so for the next 24 hours you should not:  A) Drive an automobile B) Make any legal decisions C) Drink any alcoholic beverage   2) You may resume regular meals tomorrow.  Today it is better to start with liquids and gradually work up to solid foods.  You may eat anything you prefer, but it is better to start with liquids, then soup and crackers, and gradually work up to solid foods.   3) Please notify your doctor immediately if you have any unusual bleeding, trouble breathing, redness and pain at the surgery site, drainage, fever, or pain not relieved by medication.  4) Additional Instructions:DRINK DRINK DRINK lots of fluids today.  Keep your bladder empty.  Strain urine for any stones.  Your urine may be bloody for a few days. You may also have burning, urgency and frequency for a while.  Bruising on your back area is normal after this procedure.  If you are uncomfortable, you may take Tylenol for relief of pain or ice to the region.  Keep a cloth between your back and the ice. Make sure to walk around so that you do not develop any blood clots.     Please contact your physician with any problems or Same Day Surgery at [phone], Monday through Friday 6 am to 4 pm, or [facility] number at [phone].See [facility] discharge instructions in chart.

## 2017-06-10 NOTE — H&P (View-Only) (Signed)
06/03/2017 3:20 PM   [name] Branch Apr 07, 1956 [id number]  Referring provider: Abner Greenspan, MD [address] [place], [place] [address]  Chief Complaint  Patient presents with  . Follow-up    discuss surgery option    HPI: 61 year old female who underwent ureteral stent placement on 05/25/2017 for an obstructing 8 x 10 mm right proximal ureteral calculus, fevers to 103, tachycardia. 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 Ultimately, she appears clinically mass discharge. She returns today to discuss definitive management of her stone.  Overall, she's feeling much better today. She does have some frequency and urgency related to her stent. She also has significant pelvic organ prolapse which is symptomatic.  Prior to this episode, no previous stone history. She has no nonobstructing stones.  She is currently on penicillin for a total of a 3 week course per ID.  PMH: Past Medical History:  Diagnosis Date  . Allergic rhinitis   . 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 Dr. Cristy Friedlander   . Chronic gastritis   . Hepatic steatosis   . Hyperlipidemia   . IBS (irritable bowel syndrome)   . Lyme disease    "stari"  . Migraine   . Narcolepsy   . Neuromuscular disorder ([place])   . Rectal prolapse   . Serrated adenoma of colon   . Umbilical hernia   . Vaginal prolapse   . West Nile Virus infection 2003    Surgical History: Past Surgical History:  Procedure Laterality Date  . 5th R toe fx  '09  . BREAST BIOPSY  '04   neg; right  . CHOLECYSTECTOMY  10/2011  . CYSTOSCOPY WITH STENT PLACEMENT Right 05/25/2017   Procedure: CYSTOSCOPY WITH STENT PLACEMENT;  Surgeon: Hollice Espy, MD;  Location: [facility] ORS;  Service: Urology;  Laterality: Right;  . laprascopy    . PARTIAL HYSTERECTOMY     bleeding  . TONSILLECTOMY    . ULNAR NERVE REPAIR     sx times 5  .  UPPER GASTROINTESTINAL ENDOSCOPY  12/04/2004   gastritis    Home Medications:  Allergies as of 06/03/2017      Reactions   Sulfonamide Derivatives Nausea And Vomiting      Medication List       Accurate as of 06/03/17  3:20 PM. Always use your most recent med list.          furosemide 20 MG tablet Commonly known as:  LASIX Take 1 tablet (20 mg total) by mouth daily as needed.   morphine 30 MG tablet Commonly known as:  MSIR Take 30 mg by mouth every 3 (three) hours as needed for severe pain. UAD PRN   ondansetron 4 MG disintegrating tablet Commonly known as:  ZOFRAN ODT Take 1 tablet (4 mg total) by mouth every 8 (eight) hours as needed for nausea or vomiting.   penicillin v potassium 500 MG tablet Commonly known as:  VEETID Take 1 tablet (500 mg total) by mouth 4 (four) times daily. X 21 days   potassium chloride 10 MEQ tablet Commonly known as:  K-DUR Take 1 tablet (10 mEq total) by mouth daily as needed (while on lasix).       Allergies:  Allergies  Allergen Reactions  . Sulfonamide Derivatives Nausea And Vomiting    Family History: Family History  Problem Relation Age of Onset  . Diabetes Father   .  Hypertension Father   . Stroke Father   . Hypertension Mother   . Cancer Mother        [name]  . Ulcers Mother   . Breast cancer Paternal Grandmother   . Bipolar disorder Unknown        some family  . Alcohol abuse Maternal Grandfather   . Colon cancer Neg Hx     Social History:  reports that she has never smoked. She has never used smokeless tobacco. She reports that she does not drink alcohol or use drugs.  ROS: UROLOGY Frequent Urination?: Yes Hard to postpone urination?: Yes Burning/pain with urination?: No Get up at night to urinate?: Yes Leakage of urine?: Yes Urine stream starts and stops?: No Trouble starting stream?: No Do you have to strain to urinate?: Yes Blood in urine?: No Urinary tract infection?: No Sexually transmitted  disease?: No Injury to kidneys or bladder?: No Painful intercourse?: No Weak stream?: No Currently pregnant?: No Vaginal bleeding?: No Last menstrual period?: n  Gastrointestinal Nausea?: Yes Vomiting?: No Indigestion/heartburn?: No Diarrhea?: No Constipation?: No  Constitutional Fever: No Night sweats?: No Weight loss?: No Fatigue?: Yes  Skin Skin rash/lesions?: No Itching?: No  Eyes Blurred vision?: No Double vision?: No  Ears/Nose/Throat Sore throat?: Yes Sinus problems?: Yes  Hematologic/Lymphatic Swollen glands?: No Easy bruising?: Yes  Cardiovascular Leg swelling?: No Chest pain?: No  Respiratory Cough?: Yes Shortness of breath?: Yes  Endocrine Excessive thirst?: No  Musculoskeletal Back pain?: Yes Joint pain?: Yes  Neurological Headaches?: Yes Dizziness?: Yes  Psychologic Depression?: No Anxiety?: No  Physical Exam: BP 137/76   Pulse (!) 106   Ht 5\' 6"  (1.676 m)   Wt 183 lb (83 kg)   BMI 29.54 kg/m   Constitutional:  Alert and oriented, No acute distress. HEENT: [place] AT, moist mucus membranes.  Trachea midline, no masses. Cardiovascular: No clubbing, cyanosis, or edema. Respiratory: Normal respiratory effort, no increased work of breathing. GI: Abdomen is soft, nontender, nondistended, no abdominal masses GU: No CVA tenderness. Skin: No rashes, bruises or suspicious lesions. Neurologic: Grossly intact, no focal deficits, moving all 4 extremities. Ambulating with cane.   Psychiatric: Normal mood and affect.  Laboratory Data: Lab Results  Component Value Date   WBC 10.1 05/26/2017   HGB 10.6 (L) 05/26/2017   HCT 31.9 (L) 05/26/2017   MCV 81.5 05/26/2017   PLT 127 (L) 05/26/2017    Lab Results  Component Value Date   CREATININE 0.67 05/27/2017    Lab Results  Component Value Date   HGBA1C 5.7 (H) 05/23/2017    Urinalysis    Component Value Date/Time   COLORURINE AMBER (A) 05/23/2017 0329   APPEARANCEUR CLOUDY (A)  05/23/2017 0329   LABSPEC 1.014 05/23/2017 0329   PHURINE 7.0 05/23/2017 0329   GLUCOSEU NEGATIVE 05/23/2017 0329   HGBUR NEGATIVE 05/23/2017 0329   HGBUR negative 08/06/2010 1154   BILIRUBINUR NEGATIVE 05/23/2017 0329   BILIRUBINUR Negative 02/18/2016 1149   KETONESUR NEGATIVE 05/23/2017 0329   PROTEINUR NEGATIVE 05/23/2017 0329   UROBILINOGEN 0.2 02/18/2016 1149   UROBILINOGEN 0.2 08/06/2010 1154   NITRITE POSITIVE (A) 05/23/2017 0329   LEUKOCYTESUR MODERATE (A) 05/23/2017 0329    Pertinent Imaging: CT scan from 05/25/17 reviewed personally again today and with the patient  KUB reviewed personally today- stone seen within renal pelvis at tip of stent  Assessment & Plan:    1. Right ureteral stone We discussed various treatment options including ESWL vs. ureteroscopy, laser lithotripsy, and stent.  We discussed the risks and benefits of both including bleeding, infection, damage to surrounding structures, efficacy with need for possible further intervention, and need for temporary ureteral stent.  She is most interested in shock with lithotripsy. She understands that this has a slightly higher failure rate is less invasive. All questions were answered.  - Urinalysis, Complete - CULTURE, URINE COMPREHENSIVE - DG Abd 1 View; Future  2. History of sepsis Continue antibiotics as prescribed   Return for schedule ESWL.  Hollice Espy, MD  [facility] [address] [place], [place] [address] [phone]

## 2017-06-11 ENCOUNTER — Encounter: Payer: Self-pay | Admitting: Urology

## 2017-06-17 ENCOUNTER — Ambulatory Visit
Admission: RE | Admit: 2017-06-17 | Discharge: 2017-06-17 | Disposition: A | Discharge status: Home / Self Care | Payer: 59 | Source: Ambulatory Visit | Attending: Urology | Admitting: Urology

## 2017-06-17 ENCOUNTER — Other Ambulatory Visit: Payer: Self-pay | Admitting: Radiology

## 2017-06-17 ENCOUNTER — Telehealth: Payer: Self-pay | Admitting: Radiology

## 2017-06-17 ENCOUNTER — Ambulatory Visit (INDEPENDENT_AMBULATORY_CARE_PROVIDER_SITE_OTHER): Payer: 59 | Admitting: Urology

## 2017-06-17 ENCOUNTER — Encounter
Admission: RE | Admit: 2017-06-17 | Discharge: 2017-06-17 | Disposition: A | Discharge status: Home / Self Care | Payer: 59 | Source: Ambulatory Visit | Attending: Urology | Admitting: Urology

## 2017-06-17 ENCOUNTER — Encounter: Payer: Self-pay | Admitting: Urology

## 2017-06-17 VITALS — BP 142/76 | HR 114 | Ht 66.0 in | Wt 183.0 lb

## 2017-06-17 DIAGNOSIS — N201 Calculus of ureter: Secondary | ICD-10-CM

## 2017-06-17 DIAGNOSIS — N200 Calculus of kidney: Secondary | ICD-10-CM

## 2017-06-17 DIAGNOSIS — Z87442 Personal history of urinary calculi: Secondary | ICD-10-CM | POA: Diagnosis not present

## 2017-06-17 HISTORY — DX: Other specified postprocedural states: Z98.890

## 2017-06-17 HISTORY — DX: Unspecified osteoarthritis, unspecified site: M19.90

## 2017-06-17 HISTORY — DX: Fasciculation: R25.3

## 2017-06-17 HISTORY — DX: Dyspnea, unspecified: R06.00

## 2017-06-17 HISTORY — DX: Nausea with vomiting, unspecified: R11.2

## 2017-06-17 LAB — URINALYSIS, COMPLETE
BILIRUBIN UA: NEGATIVE
Glucose, UA: NEGATIVE
Ketones, UA: NEGATIVE
NITRITE UA: NEGATIVE
PH UA: 7 (ref 5.0–7.5)
Specific Gravity, UA: 1.015 (ref 1.005–1.030)
UUROB: 0.2 mg/dL (ref 0.2–1.0)

## 2017-06-17 LAB — MICROSCOPIC EXAMINATION

## 2017-06-17 NOTE — Telephone Encounter (Signed)
Pt scheduled for right URS on 06/21/17 with Dr Erlene Quan. Discussed with pt during office visit surgery date, pre-admit testing appt & to call Friday prior to surgery for arrival time to [facility]. Questions answered & pt & husband voice understanding.

## 2017-06-17 NOTE — Patient Instructions (Addendum)
  Your procedure is scheduled on: June 21, 2017 [facility] ) Report to Same Day Surgery 2nd floor medical mall ([facility] Entrance-take elevator on left to 2nd floor.  Check in with surgery information desk.) To find out your arrival time please call [phone] between 1PM - 3PM on June 18, 2017 (FRIDAY )   Remember: Instructions that are not followed completely may result in serious medical risk, up to and including death, or upon the discretion of your surgeon and anesthesiologist your surgery may need to be rescheduled.    _x___ 1. Do not eat food or drink liquids after midnight. No gum chewing or hard candies                                __x__ 2. No Alcohol for 24 hours before or after surgery.   __x__3. No Smoking for 24 prior to surgery.   ____  4. Bring all medications with you on the day of surgery if instructed.    __x__ 5. Notify your doctor if there is any change in your medical condition     (cold, fever, infections).     Do not wear jewelry, make-up, hairpins, clips or nail polish.  Do not wear lotions, powders, or perfumes. You may wear deodorant.  Do not shave 48 hours prior to surgery. Men may shave face and neck.  Do not bring valuables to the hospital.    [facility] is not responsible for any belongings or valuables.               Contacts, dentures or bridgework may not be worn into surgery.  Leave your suitcase in the car. After surgery it may be brought to your room.  For patients admitted to the hospital, discharge time is determined by your treatment team                    Patients discharged the day of surgery will not be allowed to drive home.  You will need someone to drive you home and stay with you the night of your procedure.    Please read over the following fact sheets that you were given:   [facility] Preparing for Surgery and or MRSA Information   ____ Take anti-hypertensive (unless it includes a diuretic), cardiac, seizure, asthma,      anti-reflux and psychiatric medicines. These include:  1.   2.  3.  4.  5.  6.  ____Fleets enema or Magnesium Citrate as directed.   ____ Use CHG Soap or sage wipes as directed on instruction sheet   ____ Use inhalers on the day of surgery and bring to hospital day of surgery  ____ Stop Metformin and Janumet 2 days prior to surgery.    ____ Take 1/2 of usual insulin dose the night before surgery and none on the morning     surgery.   _x___ Follow recommendations from Cardiologist, Pulmonologist or PCP regarding          stopping Aspirin, Coumadin, Plavix ,Eliquis, Effient, or Pradaxa, and Pletal.  X____Stop Anti-inflammatories such as Advil, Aleve, Ibuprofen, Motrin, Naproxen, Naprosyn, Goodies powders or aspirin products. OK to take Tylenol may take pain medication if needed   _x___ Stop supplements until after surgery.  But may continue Vitamin D, Vitamin B and multivitamin    (STOP MELATONIN NOW )  ____ Bring C-Pap to the hospital.

## 2017-06-20 MED ORDER — GENTAMICIN IN SALINE 1.6-0.9 MG/ML-% IV SOLN
80.0000 mg | INTRAVENOUS | Status: AC
  Administered 2017-06-21: 80 mg via INTRAVENOUS
  Filled 2017-06-20: qty 50

## 2017-06-20 MED ORDER — SODIUM CHLORIDE 0.9 % IV SOLN
1.0000 g | INTRAVENOUS | Status: AC
  Administered 2017-06-21: 1 g via INTRAVENOUS
  Filled 2017-06-20: qty 1000

## 2017-06-21 ENCOUNTER — Ambulatory Visit: Payer: 59 | Admitting: Anesthesiology

## 2017-06-21 ENCOUNTER — Encounter
Admission: RE | Disposition: A | Discharge status: Home / Self Care | Payer: Self-pay | Source: Ambulatory Visit | Attending: Urology

## 2017-06-21 ENCOUNTER — Ambulatory Visit
Admission: RE | Admit: 2017-06-21 | Discharge: 2017-06-21 | Disposition: A | Discharge status: Home / Self Care | Payer: 59 | Source: Ambulatory Visit | Attending: Urology | Admitting: Urology

## 2017-06-21 ENCOUNTER — Encounter: Payer: Self-pay | Admitting: *Deleted

## 2017-06-21 DIAGNOSIS — Y831 Surgical operation with implant of artificial internal device as the cause of abnormal reaction of the patient, or of later complication, without mention of misadventure at the time of the procedure: Secondary | ICD-10-CM | POA: Diagnosis not present

## 2017-06-21 DIAGNOSIS — K589 Irritable bowel syndrome without diarrhea: Secondary | ICD-10-CM | POA: Insufficient documentation

## 2017-06-21 DIAGNOSIS — N201 Calculus of ureter: Secondary | ICD-10-CM | POA: Insufficient documentation

## 2017-06-21 DIAGNOSIS — M1990 Unspecified osteoarthritis, unspecified site: Secondary | ICD-10-CM | POA: Diagnosis not present

## 2017-06-21 DIAGNOSIS — Z79891 Long term (current) use of opiate analgesic: Secondary | ICD-10-CM | POA: Diagnosis not present

## 2017-06-21 DIAGNOSIS — G8929 Other chronic pain: Secondary | ICD-10-CM | POA: Insufficient documentation

## 2017-06-21 DIAGNOSIS — T83122A Displacement of urinary stent, initial encounter: Secondary | ICD-10-CM | POA: Insufficient documentation

## 2017-06-21 DIAGNOSIS — R5382 Chronic fatigue, unspecified: Secondary | ICD-10-CM | POA: Diagnosis not present

## 2017-06-21 DIAGNOSIS — N8110 Cystocele, unspecified: Secondary | ICD-10-CM | POA: Insufficient documentation

## 2017-06-21 DIAGNOSIS — Z87442 Personal history of urinary calculi: Secondary | ICD-10-CM | POA: Insufficient documentation

## 2017-06-21 HISTORY — PX: URETEROSCOPY WITH HOLMIUM LASER LITHOTRIPSY: SHX6645

## 2017-06-21 HISTORY — PX: CYSTOSCOPY WITH STENT PLACEMENT: SHX5790

## 2017-06-21 HISTORY — PX: HOLMIUM LASER APPLICATION: SHX5852

## 2017-06-21 SURGERY — URETEROSCOPY, WITH LITHOTRIPSY USING HOLMIUM LASER
Anesthesia: General | Site: Ureter | Laterality: Right | Wound class: Clean Contaminated

## 2017-06-21 MED ORDER — OXYCODONE HCL 5 MG PO TABS: 5.0000 mg | ORAL_TABLET | Freq: Once | ORAL | Status: DC | PRN

## 2017-06-21 MED ORDER — PROMETHAZINE HCL 25 MG/ML IJ SOLN
6.2500 mg | Freq: Once | INTRAMUSCULAR | Status: AC
  Administered 2017-06-21: 6.25 mg via INTRAVENOUS

## 2017-06-21 MED ORDER — SODIUM CHLORIDE 0.9 % IJ SOLN
INTRAMUSCULAR | Status: AC
  Filled 2017-06-21: qty 10

## 2017-06-21 MED ORDER — FENTANYL CITRATE (PF) 100 MCG/2ML IJ SOLN
INTRAMUSCULAR | Status: AC
  Filled 2017-06-21: qty 2

## 2017-06-21 MED ORDER — IPRATROPIUM-ALBUTEROL 0.5-2.5 (3) MG/3ML IN SOLN
RESPIRATORY_TRACT | Status: AC
  Filled 2017-06-21: qty 3

## 2017-06-21 MED ORDER — ROCURONIUM BROMIDE 50 MG/5ML IV SOLN
INTRAVENOUS | Status: AC
Start: 2017-06-21 — End: ?
  Filled 2017-06-21: qty 1

## 2017-06-21 MED ORDER — GLYCOPYRROLATE 0.2 MG/ML IJ SOLN
INTRAMUSCULAR | Status: AC
  Filled 2017-06-21: qty 1

## 2017-06-21 MED ORDER — LACTATED RINGERS IV SOLN
INTRAVENOUS | Status: DC
  Administered 2017-06-21: 11:00:00 via INTRAVENOUS

## 2017-06-21 MED ORDER — IOTHALAMATE MEGLUMINE 43 % IV SOLN
INTRAVENOUS | Status: DC | PRN
  Administered 2017-06-21: 15 mL

## 2017-06-21 MED ORDER — KETOROLAC TROMETHAMINE 30 MG/ML IJ SOLN
INTRAMUSCULAR | Status: DC | PRN
  Administered 2017-06-21: 30 mg via INTRAVENOUS

## 2017-06-21 MED ORDER — ONDANSETRON HCL 4 MG/2ML IJ SOLN
INTRAMUSCULAR | Status: AC
  Filled 2017-06-21: qty 2

## 2017-06-21 MED ORDER — DEXAMETHASONE SODIUM PHOSPHATE 10 MG/ML IJ SOLN
INTRAMUSCULAR | Status: DC | PRN
  Administered 2017-06-21: 10 mg via INTRAVENOUS

## 2017-06-21 MED ORDER — OXYCODONE HCL 5 MG/5ML PO SOLN: 5.0000 mg | Freq: Once | ORAL | Status: DC | PRN

## 2017-06-21 MED ORDER — IPRATROPIUM-ALBUTEROL 0.5-2.5 (3) MG/3ML IN SOLN
3.0000 mL | Freq: Once | RESPIRATORY_TRACT | Status: AC
  Administered 2017-06-21: 3 mL via RESPIRATORY_TRACT

## 2017-06-21 MED ORDER — NEOSTIGMINE METHYLSULFATE 10 MG/10ML IV SOLN
INTRAVENOUS | Status: AC
Start: 2017-06-21 — End: ?
  Filled 2017-06-21: qty 1

## 2017-06-21 MED ORDER — FENTANYL CITRATE (PF) 100 MCG/2ML IJ SOLN: 25.0000 ug | INTRAMUSCULAR | Status: DC | PRN

## 2017-06-21 MED ORDER — FAMOTIDINE 20 MG PO TABS
ORAL_TABLET | ORAL | Status: AC
  Administered 2017-06-21: 20 mg via ORAL
  Filled 2017-06-21: qty 1

## 2017-06-21 MED ORDER — MIDAZOLAM HCL 2 MG/2ML IJ SOLN
INTRAMUSCULAR | Status: AC
  Filled 2017-06-21: qty 2

## 2017-06-21 MED ORDER — PROPOFOL 10 MG/ML IV BOLUS
INTRAVENOUS | Status: AC
  Filled 2017-06-21: qty 20

## 2017-06-21 MED ORDER — FAMOTIDINE 20 MG PO TABS
20.0000 mg | ORAL_TABLET | Freq: Once | ORAL | Status: AC
  Administered 2017-06-21: 20 mg via ORAL

## 2017-06-21 MED ORDER — ONDANSETRON HCL 4 MG/2ML IJ SOLN
INTRAMUSCULAR | Status: DC | PRN
  Administered 2017-06-21: 4 mg via INTRAVENOUS

## 2017-06-21 MED ORDER — MIDAZOLAM HCL 2 MG/2ML IJ SOLN
INTRAMUSCULAR | Status: DC | PRN
  Administered 2017-06-21: 2 mg via INTRAVENOUS

## 2017-06-21 MED ORDER — SCOPOLAMINE 1 MG/3DAYS TD PT72
1.0000 | MEDICATED_PATCH | TRANSDERMAL | Status: DC
  Administered 2017-06-21: 1.5 mg via TRANSDERMAL

## 2017-06-21 MED ORDER — FENTANYL CITRATE (PF) 100 MCG/2ML IJ SOLN
INTRAMUSCULAR | Status: DC | PRN
  Administered 2017-06-21 (×2): 25 ug via INTRAVENOUS

## 2017-06-21 MED ORDER — SCOPOLAMINE 1 MG/3DAYS TD PT72
MEDICATED_PATCH | TRANSDERMAL | Status: AC
  Administered 2017-06-21: 1.5 mg via TRANSDERMAL
  Filled 2017-06-21: qty 1

## 2017-06-21 MED ORDER — PROMETHAZINE HCL 25 MG/ML IJ SOLN
INTRAMUSCULAR | Status: AC
  Administered 2017-06-21: 6.25 mg via INTRAVENOUS
  Filled 2017-06-21: qty 1

## 2017-06-21 SURGICAL SUPPLY — 32 items
BAG DRAIN CYSTO-URO LG1000N (MISCELLANEOUS) ×2 IMPLANT
BASKET ZERO TIP 1.9FR (BASKET) ×1 IMPLANT
BSKT STON RTRVL ZERO TP 1.9FR (BASKET) ×1
CATH URETL 5X70 OPEN END (CATHETERS) ×2 IMPLANT
CNTNR SPEC 2.5X3XGRAD LEK (MISCELLANEOUS) ×1
CONRAY 43 FOR UROLOGY 50M (MISCELLANEOUS) ×2 IMPLANT
CONT SPEC 4OZ STER OR WHT (MISCELLANEOUS) ×1
CONT SPEC 4OZ STRL OR WHT (MISCELLANEOUS) ×1
CONTAINER SPEC 2.5X3XGRAD LEK (MISCELLANEOUS) IMPLANT
DRAPE UTILITY 15X26 TOWEL STRL (DRAPES) ×2 IMPLANT
FIBER LASER LITHO 273 (Laser) ×1 IMPLANT
GLOVE BIO SURGEON STRL SZ 6.5 (GLOVE) ×2 IMPLANT
GOWN STRL REUS W/ TWL LRG LVL3 (GOWN DISPOSABLE) ×2 IMPLANT
GOWN STRL REUS W/TWL LRG LVL3 (GOWN DISPOSABLE) ×4
GUIDEWIRE GREEN .038 145CM (MISCELLANEOUS) ×1 IMPLANT
INFUSOR MANOMETER BAG 3000ML (MISCELLANEOUS) ×2 IMPLANT
INTRODUCER DILATOR DOUBLE (INTRODUCER) IMPLANT
KIT RM TURNOVER CYSTO AR (KITS) ×2 IMPLANT
PACK CYSTO AR (MISCELLANEOUS) ×2 IMPLANT
SCRUB POVIDONE IODINE 4 OZ (MISCELLANEOUS) IMPLANT
SENSORWIRE 0.038 NOT ANGLED (WIRE) ×2
SET CYSTO W/LG BORE CLAMP LF (SET/KITS/TRAYS/PACK) ×2 IMPLANT
SHEATH URETERAL 12FRX35CM (MISCELLANEOUS) IMPLANT
SOL .9 NS 3000ML IRR  AL (IV SOLUTION) ×1
SOL .9 NS 3000ML IRR AL (IV SOLUTION) ×1
SOL .9 NS 3000ML IRR UROMATIC (IV SOLUTION) ×1 IMPLANT
STENT URET 6FRX24 CONTOUR (STENTS) ×1 IMPLANT
STENT URET 6FRX26 CONTOUR (STENTS) IMPLANT
SURGILUBE 2OZ TUBE FLIPTOP (MISCELLANEOUS) ×2 IMPLANT
SYRINGE IRR TOOMEY STRL 70CC (SYRINGE) ×2 IMPLANT
WATER STERILE IRR 1000ML POUR (IV SOLUTION) ×2 IMPLANT
WIRE SENSOR 0.038 NOT ANGLED (WIRE) ×1 IMPLANT

## 2017-06-21 NOTE — Interval H&P Note (Signed)
History and Physical Interval Note:  06/21/2017 10:39 AM  [name] Branch  has presented today for surgery, with the diagnosis of RIGHT URETERAL STONE  The various methods of treatment have been discussed with the patient and family. After consideration of risks, benefits and other options for treatment, the patient has consented to  Procedure(s): URETEROSCOPY WITH HOLMIUM LASER LITHOTRIPSY (Right) CYSTOSCOPY WITH STENT EXCHANGE (Right) HOLMIUM LASER APPLICATION (N/A) as a surgical intervention .  The patient's history has been reviewed, patient examined, no change in status, stable for surgery.  I have reviewed the patient's chart and labs.  Questions were answered to the patient's satisfaction.    RRR cTAB  Hollice Espy

## 2017-06-21 NOTE — Anesthesia Post-op Follow-up Note (Signed)
Anesthesia QCDR form completed.        

## 2017-06-21 NOTE — H&P (View-Only) (Signed)
06/17/2017 10:34 AM   [name] Branch June 27, 1956 [id number]  Referring provider: Abner Greenspan, MD [address] [place],  [address]  Chief Complaint  Patient presents with  . Cysto Stent Removal    HPI: 61 year old female who underwent ureteral stent placement on 05/25/2017 for an obstructing 8 x 10 mm right proximal ureteral calculus, fevers to 103, tachycardia. 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  More recently, she was taken to the operating room on 06/10/2017 for shockwave lithotripsy. At the time of the procedure, her stent was noted to displaced below the level of the stone.  Follow-up KUB today shows persistent stone with minimal change. She returns to discuss options for treatment.  She also has significant pelvic organ prolapse which is symptomatic.  Prior to this episode, no previous stone history. She has no nonobstructing stones.  She is currently completing penicillin for a total of a 3 week course per ID.  PMH: Past Medical History:  Diagnosis Date  . Allergic rhinitis   . Arthritis   . 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 Dr. Cristy Friedlander   . Chronic gastritis   . Dyspnea   . Hepatic steatosis   . History of kidney stones 05/2017  . Hyperlipidemia   . IBS (irritable bowel syndrome)   . Lyme disease    "stari"  . Migraine   . Muscle twitching   . Narcolepsy   . Neuromuscular disorder ([place])   . PONV (postoperative nausea and vomiting)   . Rectal prolapse   . Serrated adenoma of colon   . Umbilical hernia   . Vaginal prolapse   . West Nile Virus infection 2003    Surgical History: Past Surgical History:  Procedure Laterality Date  . 5th R toe fx  '09  . ABDOMINAL HYSTERECTOMY    . BREAST BIOPSY  '04   neg; right  . CHOLECYSTECTOMY  10/2011  . CYSTOSCOPY WITH STENT PLACEMENT Right 05/25/2017   Procedure: CYSTOSCOPY WITH  STENT PLACEMENT;  Surgeon: Hollice Espy, MD;  Location: [facility] ORS;  Service: Urology;  Laterality: Right;  . DIAGNOSTIC LAPAROSCOPY    . EXTRACORPOREAL SHOCK WAVE LITHOTRIPSY Right 06/10/2017   Procedure: EXTRACORPOREAL SHOCK WAVE LITHOTRIPSY (ESWL);  Surgeon: Hollice Espy, MD;  Location: [facility] ORS;  Service: Urology;  Laterality: Right;  . laprascopy    . PARTIAL HYSTERECTOMY     bleeding  . TONSILLECTOMY    . TONSILLECTOMY    . ULNAR NERVE REPAIR     sx times 5  . UPPER GASTROINTESTINAL ENDOSCOPY  12/04/2004   gastritis    Home Medications:  Allergies as of 06/17/2017      Reactions   Sulfonamide Derivatives Nausea And Vomiting      Medication List       Accurate as of 06/17/17 11:59 PM. Always use your most recent med list.          cholecalciferol 1000 units tablet Commonly known as:  VITAMIN D Take 1,000 Units by mouth daily.   docusate sodium 100 MG capsule Commonly known as:  COLACE Take 1 capsule (100 mg total) by mouth 2 (two) times daily.   FIBER-CAPS PO Take 1 capsule by mouth 2 (two) times daily.   furosemide 20 MG tablet Commonly known as:  LASIX Take 1 tablet (20 mg total) by mouth daily as needed.   Melatonin 3 MG Tabs  Take 3 mg by mouth at bedtime as needed.   morphine 30 MG tablet Commonly known as:  MSIR Take 30 mg by mouth every 3 (three) hours as needed for severe pain. UAD PRN   multivitamin tablet Take 1 tablet by mouth daily.   ondansetron 4 MG disintegrating tablet Commonly known as:  ZOFRAN ODT Take 1 tablet (4 mg total) by mouth every 8 (eight) hours as needed for nausea or vomiting.   oxyCODONE-acetaminophen 5-325 MG tablet Commonly known as:  PERCOCET Take 1-2 tablets by mouth every 4 (four) hours as needed for moderate pain or severe pain.   penicillin v potassium 500 MG tablet Commonly known as:  VEETID Take 1 tablet (500 mg total) by mouth 4 (four) times daily. X 21 days   potassium chloride 10 MEQ tablet Commonly known  as:  K-DUR Take 1 tablet (10 mEq total) by mouth daily as needed (while on lasix).   tamsulosin 0.4 MG Caps capsule Commonly known as:  FLOMAX Take 1 capsule (0.4 mg total) by mouth daily.            Discharge Care Instructions        Start     Ordered   06/17/17 0000  Urinalysis, Complete     06/17/17 1121   06/17/17 0000  Microscopic Examination     06/17/17 0000   Unscheduled  Abdomen 1 view (KUB)    Question Answer Comment  Reason for Exam (SYMPTOM  OR DIAGNOSIS REQUIRED) nephrolithiasis   Preferred imaging location? [place]   Radiology Contrast Protocol - do NOT remove file path [email or web address]      06/17/17 1051      Allergies:  Allergies  Allergen Reactions  . Sulfonamide Derivatives Nausea And Vomiting    Family History: Family History  Problem Relation Age of Onset  . Diabetes Father   . Hypertension Father   . Stroke Father   . Hypertension Mother   . Cancer Mother        [name]  . Ulcers Mother   . Breast cancer Paternal Grandmother   . Bipolar disorder Unknown        some family  . Alcohol abuse Maternal Grandfather   . Colon cancer Neg Hx     Social History:  reports that she has never smoked. She has never used smokeless tobacco. She reports that she does not drink alcohol or use drugs.  ROS: Complete oral point review of systems was performed. She does have urinary symptoms related to her stent including urgency, frequency, and vaginal bulging. No fevers or chills. All other 12 systems negative.  Physical Exam: BP (!) 142/76   Pulse (!) 114   Ht 5\' 6"  (1.676 m)   Wt 183 lb (83 kg)   BMI 29.54 kg/m   Constitutional:  Alert and oriented, No acute distress. HEENT:  AT, moist mucus membranes.  Trachea midline, no masses. Cardiovascular: No clubbing, cyanosis, or edema. Respiratory: Normal respiratory effort, no increased work of breathing. GI: Abdomen is soft, nontender,  nondistended, no abdominal masses GU: No CVA tenderness. Skin: No rashes, bruises or suspicious lesions. Neurologic: Grossly intact, no focal deficits, moving all 4 extremities. Ambulating with cane.   Psychiatric: Normal mood and affect.  Laboratory Data: Lab Results  Component Value Date   WBC 10.1 05/26/2017   HGB 10.6 (L) 05/26/2017   HCT 31.9 (L) 05/26/2017   MCV 81.5 05/26/2017   PLT 127 (L) 05/26/2017    Lab Results  Component Value Date   CREATININE 0.67 05/27/2017    Lab Results  Component Value Date   HGBA1C 5.7 (H) 05/23/2017    Urinalysis    Component Value Date/Time   COLORURINE AMBER (A) 05/23/2017 0329   APPEARANCEUR Cloudy (A) 06/17/2017 1144   LABSPEC 1.014 05/23/2017 0329   PHURINE 7.0 05/23/2017 0329   GLUCOSEU Negative 06/17/2017 1144   HGBUR NEGATIVE 05/23/2017 0329   HGBUR negative 08/06/2010 1154   BILIRUBINUR Negative 06/17/2017 1144   KETONESUR NEGATIVE 05/23/2017 0329   PROTEINUR 1+ (A) 06/17/2017 1144   PROTEINUR NEGATIVE 05/23/2017 0329   UROBILINOGEN 0.2 02/18/2016 1149   UROBILINOGEN 0.2 08/06/2010 1154   NITRITE Negative 06/17/2017 1144   NITRITE POSITIVE (A) 05/23/2017 0329   LEUKOCYTESUR Trace (A) 06/17/2017 1144    Pertinent Imaging: CLINICAL DATA:  History of right-sided kidney stone. Recently hospitalized.  EXAM: ABDOMEN - 1 VIEW  COMPARISON:  KUB of June 10, 2017  FINDINGS: The approximately 4 x 7 mm stone persists in the mid right ureter. It has migrated further distally approximately 3 cm. A ureteral stent is present with the proximal portion overlying the L5 transverse process on the right with the distal pigtail in the urinary bladder. This is stable. No definite stones on the left. The colonic stool burden is increased. There surgical clips in the gallbladder fossa. The bony structures exhibit no acute abnormalities.  IMPRESSION: Distal migration of the mid right ureteral stone by approximately 3 cm  since the study of 1 week ago.   Electronically Signed   By: David  Martinique M.D.   On: 06/17/2017 11:53  KUB reviewed. Stent is displaced in the distal ureter. Stone has migrated distally but appears to be mostly intact with minimal fragmentation.  Assessment & Plan:    1. Right ureteral stone Distal migration of both stent and stone, but minimal fragmentation following ESWL.  Options today including attempted in office rewiring of stent, staged shock wave, versus proceeding with ureteroscopy were discussed. Additionally, medical expulsive therapy was discussed but given her history of urosepsis and bacterial colonization, this is not recommended.  She would like to proceed to the operating room as soon as possible for ureteroscopy. Risks of the procedure were discussed including risk of bleeding, infection, damages running structures, infection, need for staged procedure. All questions are answered.  - Urinalysis, Complete - CULTURE, URINE COMPREHENSIVE - DG Abd 1 View; Future  2. History of sepsis Continue antibiotics as prescribed  Schedule right URS  Hollice Espy, MD  [place] [address] [place], [place] [address] [phone]

## 2017-06-21 NOTE — OR Nursing (Signed)
Patient reports having pain medication and antibiotics at home.

## 2017-06-21 NOTE — Anesthesia Procedure Notes (Signed)
Procedure Name: LMA Insertion Date/Time: 06/21/2017 11:00 AM Performed by: Demetrius Charity Pre-anesthesia Checklist: Patient identified, Patient being monitored, Timeout performed, Emergency Drugs available and Suction available Patient Re-evaluated:Patient Re-evaluated prior to induction Oxygen Delivery Method: Circle system utilized Preoxygenation: Pre-oxygenation with 100% oxygen Induction Type: IV induction Ventilation: Mask ventilation without difficulty LMA: LMA inserted LMA Size: 4.0 Tube type: Oral Number of attempts: 1 Placement Confirmation: positive ETCO2 and breath sounds checked- equal and bilateral Tube secured with: Tape Dental Injury: Teeth and Oropharynx as per pre-operative assessment  Comments: Placed by EW.

## 2017-06-21 NOTE — Op Note (Signed)
Date of procedure: 06/21/17  Preoperative diagnosis:  1. Right ureteral calculus 2. History of urosepsis 3. Cystocele  4. Malpositioned ureteral stent  Postoperative diagnosis:  1. Same as above   Procedure: 1. Right ureteroscopy 2. Laser lithotripsy 3. Basket extraction of stone fragment 4. Right retrograde pyelogram 5. Right ureteral stent exchange  Surgeon: Hollice Espy, MD  Anesthesia: General  Complications: None  Intraoperative findings: displaced ureteral stent, distal migration below the level of the stone. Stone somewhat impacted with significant edema and narrowing at the level of the stone. All stone fragments cleared. Debris in the kidney. Stent replaced.  EBL: minimal  Specimens: stone fragement  Drains: 6 x 24 Fr JJ ureteral stent on right  Indication: [name] Branch is a 61 y.o. patient with 10 mm  obstructing proximal ureteral calculus who underwent urgent stent placement in the setting of infection.  She subsequently underwent shockwave lithotripsy which was not su in her sten twas noted to bee migrated below the level of the stent.  She returns today for definative management of her stone.  After reviewing the management options for treatment, she elected to proceed with the above surgical procedure(s). We have discussed the potential benefits and risks of the procedure, side effects of the proposed treatment, the likelihood of the patient achieving the goals of the procedure, and any potential problems that might occur during the procedure or recuperation. Informed consent has been obtained.  Description of procedure:  The patient was taken to the operating room and general anesthesia was induced.  The patient was placed in the dorsal lithotomy position, prepped and draped in the usual sterile fashion, and preoperative antibiotics were administered. A preoperative time-out was performed.   A 21 French scope was advanced per urethra to the bladder. Scout  imaging revealed migration of the stent to the level of the distal ureter. The distal coil of the stent was grasped and brought to the level of the urethral meatus. This is then cannulated to wire up to level of the stone where there was some resistance but ultimately the wire passed beyond this up to level of the kidney. This was snapped in place as a safety wire. The stent was removed. A 4.5 French semirigid ureteroscope was then advanced up to level of the stone. Of note, there was significant edema at this level and a somewhat ragged scarlike appearance of the ureter at this level. A 273  laser fiber was then brought in and using the settings of 0.8 J and 10 Hz, the stone was fragmented into multiple smaller pieces. Each of these pieces was then extracted using a 1.9 [place] to plus nitinol basket. The scope was then able to be advanced into the proximal ureter but there was concern for stone debris above this level. As such, a superstiff wire was advanced up to level of the kidney as a working wire. A flexible 7 French ureteroscope was advanced up to level of the kidney over this wire without difficulty. The renal pelvis and all calyces were directly visualized. A retrograde pyelogram was performed by injecting contrast through the scope to create a roadmap. There was no significant residual stone debris appeared. The scope was then backed down the length of the ureter. Again in the mid ureter, there was fairly significant amount of edema and narrowing. There is no additional stone fragments identified within the ureter. A 6 x 24 French double-J ureteral stent was then advanced over the safety wire up to level of  the renal pelvis. The wire was partially drawn until full coil was noted within the renal pelvis. The wire was then fully withdrawn and a partial coil appreciated at the level of the bladder. Upon direct visualization, it appeared that the distal coil o just at the level of the orifice. Stent graspers  used to reposition this into a satisfactory position with a full coil in the bladder. The bladder was then drained. The patient was then cleaned and dried, repositioned the supine position, reversed from anesthesia, taken the PACU in stable condition.  Plan: Patient will return in 10 days for cystoscopy, stent removal.She will complete her antibiotic course as previously scheduled.  Hollice Espy, M.D.

## 2017-06-21 NOTE — Progress Notes (Signed)
06/17/2017 10:34 AM   [name] Branch 1956-09-17 [id number]  Referring provider: Abner Greenspan, MD [address] [place], [place] [address]  Chief Complaint  Patient presents with  . Cysto Stent Removal    HPI: 61 year old female who underwent ureteral stent placement on 05/25/2017 for an obstructing 8 x 10 mm right proximal ureteral calculus, fevers to 103, tachycardia. 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  More recently, she was taken to the operating room on 06/10/2017 for shockwave lithotripsy. At the time of the procedure, her stent was noted to displaced below the level of the stone.  Follow-up KUB today shows persistent stone with minimal change. She returns to discuss options for treatment.  She also has significant pelvic organ prolapse which is symptomatic.  Prior to this episode, no previous stone history. She has no nonobstructing stones.  She is currently completing penicillin for a total of a 3 week course per ID.  PMH: Past Medical History:  Diagnosis Date  . Allergic rhinitis   . Arthritis   . 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 Dr. Cristy Friedlander   . Chronic gastritis   . Dyspnea   . Hepatic steatosis   . History of kidney stones 05/2017  . Hyperlipidemia   . IBS (irritable bowel syndrome)   . Lyme disease    "stari"  . Migraine   . Muscle twitching   . Narcolepsy   . Neuromuscular disorder ([place])   . PONV (postoperative nausea and vomiting)   . Rectal prolapse   . Serrated adenoma of colon   . Umbilical hernia   . Vaginal prolapse   . West Nile Virus infection 2003    Surgical History: Past Surgical History:  Procedure Laterality Date  . 5th R toe fx  '09  . ABDOMINAL HYSTERECTOMY    . BREAST BIOPSY  '04   neg; right  . CHOLECYSTECTOMY  10/2011  . CYSTOSCOPY WITH STENT PLACEMENT Right 05/25/2017   Procedure: CYSTOSCOPY WITH  STENT PLACEMENT;  Surgeon: Hollice Espy, MD;  Location: [facility];  Service: Urology;  Laterality: Right;  . DIAGNOSTIC LAPAROSCOPY    . EXTRACORPOREAL SHOCK WAVE LITHOTRIPSY Right 06/10/2017   Procedure: EXTRACORPOREAL SHOCK WAVE LITHOTRIPSY (ESWL);  Surgeon: Hollice Espy, MD;  Location: [facility] OR[facility];  Service: Urology;  Laterality: Right;  . laprascopy    . PARTIAL HYSTERECTOMY     bleeding  . TONSILLECTOMY    . TONSILLECTOMY    . ULNAR NERVE REPAIR     sx times 5  . UPPER GASTROINTESTINAL ENDOSCOPY  12/04/2004   gastritis    Home Medications:  Allergies as of 06/17/2017      Reactions   Sulfonamide Derivatives Nausea And Vomiting      Medication List       Accurate as of 06/17/17 11:59 PM. Always use your most recent med list.          cholecalciferol 1000 units tablet Commonly known as:  VITAMIN D Take 1,000 Units by mouth daily.   docusate sodium 100 MG capsule Commonly known as:  COLACE Take 1 capsule (100 mg total) by mouth 2 (two) times daily.   FIBER-CAPS PO Take 1 capsule by mouth 2 (two) times daily.   furosemide 20 MG tablet Commonly known as:  LASIX Take 1 tablet (20 mg total) by mouth daily as needed.   Melatonin 3 MG Tabs  Take 3 mg by mouth at bedtime as needed.   morphine 30 MG tablet Commonly known as:  MSIR Take 30 mg by mouth every 3 (three) hours as needed for severe pain. UAD PRN   multivitamin tablet Take 1 tablet by mouth daily.   ondansetron 4 MG disintegrating tablet Commonly known as:  ZOFRAN ODT Take 1 tablet (4 mg total) by mouth every 8 (eight) hours as needed for nausea or vomiting.   oxyCODONE-acetaminophen 5-325 MG tablet Commonly known as:  PERCOCET Take 1-2 tablets by mouth every 4 (four) hours as needed for moderate pain or severe pain.   penicillin v potassium 500 MG tablet Commonly known as:  VEETID Take 1 tablet (500 mg total) by mouth 4 (four) times daily. X 21 days   potassium chloride 10 MEQ tablet Commonly known  as:  K-DUR Take 1 tablet (10 mEq total) by mouth daily as needed (while on lasix).   tamsulosin 0.4 MG Caps capsule Commonly known as:  FLOMAX Take 1 capsule (0.4 mg total) by mouth daily.            Discharge Care Instructions        Start     Ordered   06/17/17 0000  Urinalysis, Complete     06/17/17 1121   06/17/17 0000  Microscopic Examination     06/17/17 0000   Unscheduled  Abdomen 1 view (KUB)    Question Answer Comment  Reason for Exam (SYMPTOM  OR DIAGNOSIS REQUIRED) nephrolithiasis   Preferred imaging location? [facility]   Radiology Contrast Protocol - do NOT remove file path [email or web address]      06/17/17 1051      Allergies:  Allergies  Allergen Reactions  . Sulfonamide Derivatives Nausea And Vomiting    Family History: Family History  Problem Relation Age of Onset  . Diabetes Father   . Hypertension Father   . Stroke Father   . Hypertension Mother   . Cancer Mother        [name]  . Ulcers Mother   . Breast cancer Paternal Grandmother   . Bipolar disorder Unknown        some family  . Alcohol abuse Maternal Grandfather   . Colon cancer Neg Hx     Social History:  reports that she has never smoked. She has never used smokeless tobacco. She reports that she does not drink alcohol or use drugs.  ROS: Complete oral point review of systems was performed. She does have urinary symptoms related to her stent including urgency, frequency, and vaginal bulging. No fevers or chills. All other 12 systems negative.  Physical Exam: BP (!) 142/76   Pulse (!) 114   Ht 5\' 6"  (1.676 m)   Wt 183 lb (83 kg)   BMI 29.54 kg/m   Constitutional:  Alert and oriented, No acute distress. HEENT: [place] AT, moist mucus membranes.  Trachea midline, no masses. Cardiovascular: No clubbing, cyanosis, or edema. Respiratory: Normal respiratory effort, no increased work of breathing. GI: Abdomen is soft, nontender,  nondistended, no abdominal masses GU: No CVA tenderness. Skin: No rashes, bruises or suspicious lesions. Neurologic: Grossly intact, no focal deficits, moving all 4 extremities. Ambulating with cane.   Psychiatric: Normal mood and affect.  Laboratory Data: Lab Results  Component Value Date   WBC 10.1 05/26/2017   HGB 10.6 (L) 05/26/2017   HCT 31.9 (L) 05/26/2017   MCV 81.5 05/26/2017   PLT 127 (L) 05/26/2017    Lab Results  Component Value Date   CREATININE 0.67 05/27/2017    Lab Results  Component Value Date   HGBA1C 5.7 (H) 05/23/2017    Urinalysis    Component Value Date/Time   COLORURINE AMBER (A) 05/23/2017 0329   APPEARANCEUR Cloudy (A) 06/17/2017 1144   LABSPEC 1.014 05/23/2017 0329   PHURINE 7.0 05/23/2017 0329   GLUCOSEU Negative 06/17/2017 1144   HGBUR NEGATIVE 05/23/2017 0329   HGBUR negative 08/06/2010 1154   BILIRUBINUR Negative 06/17/2017 1144   KETONESUR NEGATIVE 05/23/2017 0329   PROTEINUR 1+ (A) 06/17/2017 1144   PROTEINUR NEGATIVE 05/23/2017 0329   UROBILINOGEN 0.2 02/18/2016 1149   UROBILINOGEN 0.2 08/06/2010 1154   NITRITE Negative 06/17/2017 1144   NITRITE POSITIVE (A) 05/23/2017 0329   LEUKOCYTESUR Trace (A) 06/17/2017 1144    Pertinent Imaging: CLINICAL DATA:  History of right-sided kidney stone. Recently hospitalized.  EXAM: ABDOMEN - 1 VIEW  COMPARISON:  KUB of June 10, 2017  FINDINGS: The approximately 4 x 7 mm stone persists in the mid right ureter. It has migrated further distally approximately 3 cm. A ureteral stent is present with the proximal portion overlying the L5 transverse process on the right with the distal pigtail in the urinary bladder. This is stable. No definite stones on the left. The colonic stool burden is increased. There surgical clips in the gallbladder fossa. The bony structures exhibit no acute abnormalities.  IMPRESSION: Distal migration of the mid right ureteral stone by approximately 3 cm  since the study of 1 week ago.   Electronically Signed   By: David  Martinique M.D.   On: 06/17/2017 11:53  KUB reviewed. Stent is displaced in the distal ureter. Stone has migrated distally but appears to be mostly intact with minimal fragmentation.  Assessment & Plan:    1. Right ureteral stone Distal migration of both stent and stone, but minimal fragmentation following ESWL.  Options today including attempted in office rewiring of stent, staged shock wave, versus proceeding with ureteroscopy were discussed. Additionally, medical expulsive therapy was discussed but given her history of urosepsis and bacterial colonization, this is not recommended.  She would like to proceed to the operating room as soon as possible for ureteroscopy. Risks of the procedure were discussed including risk of bleeding, infection, damages running structures, infection, need for staged procedure. All questions are answered.  - Urinalysis, Complete - CULTURE, URINE COMPREHENSIVE - DG Abd 1 View; Future  2. History of sepsis Continue antibiotics as prescribed  Schedule right URS  Hollice Espy, MD  [facility] [address], [place], [place] [address] [phone]

## 2017-06-21 NOTE — Anesthesia Preprocedure Evaluation (Signed)
Anesthesia Evaluation  Patient identified by MRN, date of birth, ID band Patient awake    Reviewed: Allergy & Precautions, H&P , NPO status , Patient's Chart, lab work & pertinent test results  History of Anesthesia Complications (+) PONV and history of anesthetic complications  Airway Mallampati: III  TM Distance: >3 FB Neck ROM: full    Dental  (+) Poor Dentition, Chipped She reports that due to her disease that her teeth break easily:   Pulmonary shortness of breath and with exertion,  Patient reports that she has lung disease that is being worked up.  That she has "trouble breathing on the right side"          Cardiovascular Exercise Tolerance: Good (-) angina(-) Past MI      Neuro/Psych  Headaches,  Neuromuscular disease negative psych ROS   GI/Hepatic negative GI ROS, Neg liver ROS,   Endo/Other  negative endocrine ROS  Renal/GU Renal disease     Musculoskeletal  (+) Arthritis ,   Abdominal   Peds  Hematology negative hematology ROS (+)   Anesthesia Other Findings Past Medical History: No date: Allergic rhinitis No date: Arthritis No date: Chronic fatigue syndrome     Comment:  CFIDs; also chronic joint and mucle pain (possible tik               bourne illness?). Dr. Cristy Friedlander  No date: Chronic gastritis No date: Dyspnea No date: Hepatic steatosis 05/2017: History of kidney stones No date: Hyperlipidemia No date: IBS (irritable bowel syndrome) No date: Lyme disease     Comment:  "stari" No date: Migraine No date: Muscle twitching No date: Narcolepsy No date: Neuromuscular disorder ([facility]) No date: PONV (postoperative nausea and vomiting) No date: Rectal prolapse No date: Serrated adenoma of colon No date: Umbilical hernia No date: Vaginal prolapse 2003: West Nile Virus infection  Past Surgical History: '09: 5th R toe fx No date: ABDOMINAL HYSTERECTOMY '04: BREAST BIOPSY     Comment:  neg;  right 10/2011: CHOLECYSTECTOMY 05/25/2017: CYSTOSCOPY WITH STENT PLACEMENT; Right     Comment:  Procedure: CYSTOSCOPY WITH STENT PLACEMENT;  Surgeon:               Hollice Espy, MD;  Location: [facility];  Service:               Urology;  Laterality: Right; No date: DIAGNOSTIC LAPAROSCOPY 06/10/2017: EXTRACORPOREAL SHOCK WAVE LITHOTRIPSY; Right     Comment:  Procedure: EXTRACORPOREAL SHOCK WAVE LITHOTRIPSY (ESWL);              Surgeon: Hollice Espy, MD;  Location: [facility];                Service: Urology;  Laterality: Right; No date: laprascopy No date: PARTIAL HYSTERECTOMY     Comment:  bleeding No date: TONSILLECTOMY No date: TONSILLECTOMY No date: ULNAR NERVE REPAIR     Comment:  sx times 5 12/04/2004: UPPER GASTROINTESTINAL ENDOSCOPY     Comment:  gastritis  BMI    Body Mass Index:  29.05 kg/m      Reproductive/Obstetrics negative OB ROS                             Anesthesia Physical Anesthesia Plan  ASA: III  Anesthesia Plan: General LMA   Post-op Pain Management:    Induction: Intravenous  PONV Risk Score and Plan: 4 or greater and Ondansetron, Dexamethasone, Midazolam and Treatment may vary  due to age or medical condition  Airway Management Planned: LMA  Additional Equipment:   Intra-op Plan:   Post-operative Plan: Extubation in OR  Informed Consent: I have reviewed the patients History and Physical, chart, labs and discussed the procedure including the risks, benefits and alternatives for the proposed anesthesia with the patient or authorized representative who has indicated his/her understanding and acceptance.   Dental Advisory Given  Plan Discussed with: Anesthesiologist, CRNA and Surgeon  Anesthesia Plan Comments: (Patient consented for higher risk of dental trauma due to her disease.  Patient voiced understanding.   Patient consented for risks of anesthesia including but not limited to:  - adverse reactions to  medications - damage to teeth, lips or other oral mucosa - sore throat or hoarseness - Damage to heart, brain, lungs or loss of life  Patient voiced understanding.)        Anesthesia Quick Evaluation

## 2017-06-21 NOTE — Transfer of Care (Signed)
Immediate Anesthesia Transfer of Care Note  Patient: [name] Branch  Procedure(s) Performed: Procedure(s): URETEROSCOPY WITH HOLMIUM LASER LITHOTRIPSY (Right) CYSTOSCOPY WITH STENT EXCHANGE (Right) HOLMIUM LASER APPLICATION (Right)  Patient Location: PACU  Anesthesia Type:General  Level of Consciousness: sedated  Airway & Oxygen Therapy: Patient Spontanous Breathing and Patient connected to face mask oxygen  Post-op Assessment: Report given to RN and Post -op Vital signs reviewed and stable  Post vital signs: Reviewed and stable  Last Vitals:  Vitals:   06/21/17 1010 06/21/17 1143  BP: 139/78 120/71  Pulse: (!) 102 95  Resp: 16 13  Temp: 37 C 36.9 C  SpO2: 96% 97%    Last Pain:  Vitals:   06/21/17 1016  TempSrc:   PainSc: 6          Complications: No apparent anesthesia complications

## 2017-06-21 NOTE — Anesthesia Postprocedure Evaluation (Signed)
Anesthesia Post Note  Patient: [name] Branch  Procedure(s) Performed: Procedure(s) (LRB): URETEROSCOPY WITH HOLMIUM LASER LITHOTRIPSY (Right) CYSTOSCOPY WITH STENT EXCHANGE (Right) HOLMIUM LASER APPLICATION (Right)  Patient location during evaluation: PACU Anesthesia Type: General Level of consciousness: awake and alert Pain management: pain level controlled Vital Signs Assessment: post-procedure vital signs reviewed and stable Respiratory status: spontaneous breathing, nonlabored ventilation, respiratory function stable and patient connected to nasal cannula oxygen Cardiovascular status: blood pressure returned to baseline and stable Postop Assessment: no signs of nausea or vomiting Anesthetic complications: no Comments: Patient was slow to wake up in the PACU but is now awake and alert.     Last Vitals:  Vitals:   06/21/17 1343 06/21/17 1358  BP: (!) 149/77   Pulse: (!) 106 95  Resp: (!) 22 15  Temp:    SpO2: 97% 96%    Last Pain:  Vitals:   06/21/17 1343  TempSrc:   PainSc: 0-No pain                 Precious Haws Rylan Kaufmann

## 2017-06-21 NOTE — Discharge Instructions (Signed)
AMBULATORY SURGERY  DISCHARGE INSTRUCTIONS   1) The drugs that you were given will stay in your system until tomorrow so for the next 24 hours you should not:  A) Drive an automobile B) Make any legal decisions C) Drink any alcoholic beverage   2) You may resume regular meals tomorrow.  Today it is better to start with liquids and gradually work up to solid foods.  You may eat anything you prefer, but it is better to start with liquids, then soup and crackers, and gradually work up to solid foods.   3) Please notify your doctor immediately if you have any unusual bleeding, trouble breathing, redness and pain at the surgery site, drainage, fever, or pain not relieved by medication.    4) Additional Instructions:        Please contact your physician with any problems or Same Day Surgery at [phone], Monday through Friday 6 am to 4 pm, or [place] [facility] number at [phone].You have a ureteral stent in place.  This is a tube that extends from your kidney to your bladder.  This may cause urinary bleeding, burning with urination, and urinary frequency.  Please call our office or present to the ED if you develop fevers >101 or pain which is not able to be controlled with oral pain medications.  You may be given either Flomax and/ or ditropan to help with bladder spasms and stent pain in addition to pain medications.    [facility] [address], [address] [place], [place] [address] [phone]  

## 2017-06-22 ENCOUNTER — Encounter: Payer: Self-pay | Admitting: Urology

## 2017-06-23 ENCOUNTER — Encounter: Payer: Self-pay | Admitting: Urology

## 2017-06-25 LAB — STONE ANALYSIS
CA PHOS CRY STONE QL IR: 5 %
Ca Oxalate,Dihydrate: 5 %
Ca Oxalate,Monohydr.: 90 %
STONE WEIGHT KSTONE: 10 mg

## 2017-07-01 ENCOUNTER — Ambulatory Visit (INDEPENDENT_AMBULATORY_CARE_PROVIDER_SITE_OTHER): Payer: 59 | Admitting: Urology

## 2017-07-01 ENCOUNTER — Encounter: Payer: Self-pay | Admitting: Urology

## 2017-07-01 VITALS — BP 101/63 | HR 101 | Ht 66.0 in | Wt 181.9 lb

## 2017-07-01 DIAGNOSIS — N8111 Cystocele, midline: Secondary | ICD-10-CM

## 2017-07-01 DIAGNOSIS — N200 Calculus of kidney: Secondary | ICD-10-CM

## 2017-07-01 LAB — URINALYSIS, COMPLETE
BILIRUBIN UA: NEGATIVE
Glucose, UA: NEGATIVE
KETONES UA: NEGATIVE
NITRITE UA: NEGATIVE
PH UA: 6.5 (ref 5.0–7.5)
SPEC GRAV UA: 1.02 (ref 1.005–1.030)
Urobilinogen, Ur: 0.2 mg/dL (ref 0.2–1.0)

## 2017-07-01 LAB — MICROSCOPIC EXAMINATION

## 2017-07-01 MED ORDER — CIPROFLOXACIN HCL 500 MG PO TABS: 500.0000 mg | ORAL_TABLET | Freq: Once | ORAL | Status: DC

## 2017-07-01 MED ORDER — LIDOCAINE HCL 2 % EX GEL
1.0000 "application " | Freq: Once | CUTANEOUS | Status: AC
  Administered 2017-07-01: 1 via URETHRAL

## 2017-07-01 NOTE — Progress Notes (Signed)
   07/01/17  CC:  Chief Complaint  Patient presents with  . Cysto Stent Removal    HPI: 61 yo F s/p R URS, LL, stent exchange on 06/21/17 after failed ESWL.  She returns today for stent removal.  No fevers chills.  Last day of abx for urosepsis.    Blood pressure 101/63, pulse (!) 101, height 5\' 6"  (1.676 m), weight 181 lb 14.4 oz (82.5 kg). NED. A&Ox3.   No respiratory distress   Abd soft, NT, ND Normal external genitalia with patent urethral meatus  Cystoscopy/ Stent removal procedure  Patient identification was confirmed, informed consent was obtained, and patient was prepped using Betadine solution.  Lidocaine jelly was administered per urethral meatus.    Preoperative abx where received prior to procedure.    Procedure: - Flexible cystoscope introduced, without any difficulty.   - Thorough search of the bladder revealed:    normal urethral meatus  Stent seen emanating from right ureteral orifice, grasped with stent graspers, and removed in entirety.    Post-Procedure: - Patient tolerated the procedure well   Assessment/ Plan:  1. Nephrolithiasis S/p R URS - stent removed today Warning symptoms reviewed F/u 4 weeks with RUS - Urinalysis, Complete - lidocaine (XYLOCAINE) 2 % jelly 1 application; Place 1 application into the urethra once. - US RENAL; Future  2. Cystocele, midline C/o vaginal irritation Recommend estrogen cream M-W-Fr, sample given  Return for f/u RUS.   Hollice Espy, MD

## 2017-07-23 ENCOUNTER — Encounter: Payer: Self-pay | Admitting: Urology

## 2017-07-27 ENCOUNTER — Ambulatory Visit
Admission: RE | Admit: 2017-07-27 | Discharge: 2017-07-27 | Disposition: A | Discharge status: Home / Self Care | Payer: 59 | Source: Ambulatory Visit | Attending: Urology | Admitting: Urology

## 2017-07-27 DIAGNOSIS — Z09 Encounter for follow-up examination after completed treatment for conditions other than malignant neoplasm: Secondary | ICD-10-CM | POA: Insufficient documentation

## 2017-07-27 DIAGNOSIS — N200 Calculus of kidney: Secondary | ICD-10-CM | POA: Diagnosis not present

## 2017-07-29 ENCOUNTER — Ambulatory Visit (INDEPENDENT_AMBULATORY_CARE_PROVIDER_SITE_OTHER): Payer: 59 | Admitting: Urology

## 2017-07-29 ENCOUNTER — Encounter: Payer: Self-pay | Admitting: Urology

## 2017-07-29 VITALS — BP 132/71 | HR 121 | Ht 66.0 in | Wt 181.0 lb

## 2017-07-29 DIAGNOSIS — N200 Calculus of kidney: Secondary | ICD-10-CM | POA: Diagnosis not present

## 2017-07-29 DIAGNOSIS — R339 Retention of urine, unspecified: Secondary | ICD-10-CM

## 2017-07-29 DIAGNOSIS — N8111 Cystocele, midline: Secondary | ICD-10-CM

## 2017-08-24 NOTE — Progress Notes (Signed)
07/29/2017 10:58 AM   [name] Branch [birth year] [id number]  Referring provider: Abner Greenspan, MD [address] [place], [place] [address]  Chief Complaint  Patient presents with  . RUS results    HPI: 61 year old female with a history of pelvic organ prolapse and a septic obstructing right ureteral calculus who underwent emergent ureteral stent placement followed by definitive ureteroscopy after failed shockwave lithotripsy on 06/21/2017.  Her stent was subsequently removed on 07/01/2017.  She returns today for follow-up renal ultrasound.  She did have an acute illness with fever last week.  She did have some associated left flank pain but this is improving.  Her fevers resolved.  She does have baseline urinary symptoms and is primarily bothered by severe pelvic organ prolapse.  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  This has prohibited her from undergoing reconstruction.  Stone analysis consistent with 5% calcium oxalate dihydrate, 90% calcium oxalate monohydrate, 5% calcium phosphate.  She struggles to drink enough water.   PMH: Past Medical History:  Diagnosis Date  . Allergic rhinitis   . Arthritis   . 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 Dr. Cristy Friedlander   . Chronic gastritis   . Dyspnea   . Hepatic steatosis   . History of kidney stones 05/2017  . Hyperlipidemia   . IBS (irritable bowel syndrome)   . Lyme disease    "stari"  . Migraine   . Muscle twitching   . Narcolepsy   . Neuromuscular disorder ([place])   . PONV (postoperative nausea and vomiting)   . Rectal prolapse   . Serrated adenoma of colon   . Umbilical hernia   . Vaginal prolapse   . West Nile Virus infection 2003    Surgical History: Past Surgical History:  Procedure Laterality Date  . 5th R toe fx  '09  . ABDOMINAL HYSTERECTOMY    . BREAST BIOPSY  '04   neg; right  . CHOLECYSTECTOMY  10/2011  .  CYSTOSCOPY WITH STENT PLACEMENT Right 05/25/2017   Procedure: CYSTOSCOPY WITH STENT PLACEMENT;  Surgeon: Hollice Espy, MD;  Location: [facility];  Service: Urology;  Laterality: Right;  . CYSTOSCOPY WITH STENT PLACEMENT Right 06/21/2017   Procedure: CYSTOSCOPY WITH STENT EXCHANGE;  Surgeon: Hollice Espy, MD;  Location: [facility];  Service: Urology;  Laterality: Right;  . DIAGNOSTIC LAPAROSCOPY    . EXTRACORPOREAL SHOCK WAVE LITHOTRIPSY Right 06/10/2017   Procedure: EXTRACORPOREAL SHOCK WAVE LITHOTRIPSY (ESWL);  Surgeon: Hollice Espy, MD;  Location: [facility];  Service: Urology;  Laterality: Right;  . HOLMIUM LASER APPLICATION Right 0/34/7425   Procedure: HOLMIUM LASER APPLICATION;  Surgeon: Hollice Espy, MD;  Location: [facility];  Service: Urology;  Laterality: Right;  . laprascopy    . PARTIAL HYSTERECTOMY     bleeding  . TONSILLECTOMY    . TONSILLECTOMY    . ULNAR NERVE REPAIR     sx times 5  . UPPER GASTROINTESTINAL ENDOSCOPY  12/04/2004   gastritis  . URETEROSCOPY WITH HOLMIUM LASER LITHOTRIPSY Right 06/21/2017   Procedure: URETEROSCOPY WITH HOLMIUM LASER LITHOTRIPSY;  Surgeon: Hollice Espy, MD;  Location: [facility];  Service: Urology;  Laterality: Right;    Home Medications:  Allergies as of 07/29/2017      Reactions   Sulfonamide Derivatives Nausea And Vomiting      Medication List       Accurate as of 07/29/17 11:59 PM.  Always use your most recent med list.          cholecalciferol 1000 units tablet Commonly known as:  VITAMIN D Take 1,000 Units by mouth daily.   docusate sodium 100 MG capsule Commonly known as:  COLACE Take 1 capsule (100 mg total) by mouth 2 (two) times daily.   FIBER-CAPS PO Take 1 capsule by mouth 2 (two) times daily.   furosemide 20 MG tablet Commonly known as:  LASIX Take 1 tablet (20 mg total) by mouth daily as needed.   Melatonin 3 MG Tabs Take 3 mg by mouth at bedtime as needed.   morphine 30 MG tablet Commonly known as:   MSIR Take 30 mg by mouth every 3 (three) hours as needed for severe pain. UAD PRN   multivitamin tablet Take 1 tablet by mouth daily.   ondansetron 4 MG disintegrating tablet Commonly known as:  ZOFRAN ODT Take 1 tablet (4 mg total) by mouth every 8 (eight) hours as needed for nausea or vomiting.   potassium chloride 10 MEQ tablet Commonly known as:  K-DUR Take 1 tablet (10 mEq total) by mouth daily as needed (while on lasix).       Allergies:  Allergies  Allergen Reactions  . Sulfonamide Derivatives Nausea And Vomiting    Family History: Family History  Problem Relation Age of Onset  . Diabetes Father   . Hypertension Father   . Stroke Father   . Hypertension Mother   . Cancer Mother        [name]  . Ulcers Mother   . Breast cancer Paternal Grandmother   . Bipolar disorder Unknown        some family  . Alcohol abuse Maternal Grandfather   . Colon cancer Neg Hx     Social History:  reports that she has never smoked. She has never used smokeless tobacco. She reports that she does not drink alcohol or use drugs.  ROS: UROLOGY Frequent Urination?: Yes Hard to postpone urination?: Yes Burning/pain with urination?: No Get up at night to urinate?: Yes Leakage of urine?: Yes Urine stream starts and stops?: No Trouble starting stream?: No Do you have to strain to urinate?: No Blood in urine?: No Urinary tract infection?: No Sexually transmitted disease?: No Injury to kidneys or bladder?: No Painful intercourse?: No Weak stream?: No Currently pregnant?: No Vaginal bleeding?: No Last menstrual period?: n  Gastrointestinal Nausea?: Yes Vomiting?: No Indigestion/heartburn?: No Diarrhea?: No Constipation?: Yes  Constitutional Fever: No Night sweats?: No Weight loss?: No Fatigue?: Yes  Skin Skin rash/lesions?: Yes Itching?: Yes  Eyes Blurred vision?: No Double vision?: No  Ears/Nose/Throat Sore throat?: No Sinus problems?:  Yes  Hematologic/Lymphatic Swollen glands?: No Easy bruising?: Yes  Cardiovascular Leg swelling?: Yes Chest pain?: No  Respiratory Cough?: No Shortness of breath?: No  Endocrine Excessive thirst?: No  Musculoskeletal Back pain?: No Joint pain?: Yes  Neurological Headaches?: No Dizziness?: No  Psychologic Depression?: No Anxiety?: No  Physical Exam: BP 132/71   Pulse (!) 121   Ht 5\' 6"  (1.676 m)   Wt 181 lb (82.1 kg)   BMI 29.21 kg/m   Constitutional:  Alert and oriented, No acute distress. HEENT: [place] AT, moist mucus membranes.  Trachea midline, no masses. Cardiovascular: No clubbing, cyanosis, or edema. Respiratory: Normal respiratory effort, no increased work of breathing. GI: Abdomen is soft, nontender, nondistended, no abdominal masses GU: No CVA tenderness.  Skin: No rashes, bruises or suspicious lesions. Neurologic: Grossly intact, no focal  deficits, moving all 4 extremities. Psychiatric: Normal mood and affect.  Laboratory Data: Lab Results  Component Value Date   WBC 10.1 05/26/2017   HGB 10.6 (L) 05/26/2017   HCT 31.9 (L) 05/26/2017   MCV 81.5 05/26/2017   PLT 127 (L) 05/26/2017    Lab Results  Component Value Date   CREATININE 0.67 05/27/2017    Lab Results  Component Value Date   HGBA1C 5.7 (H) 05/23/2017    Urinalysis N/a  Pertinent Imaging: Results for orders placed during the hospital encounter of 07/27/17  US RENAL   Narrative CLINICAL DATA:  Nephrolithiasis.  EXAM: RENAL / URINARY TRACT ULTRASOUND COMPLETE  COMPARISON:  None.  FINDINGS: Right Kidney:  Length: 9.9 cm. Minimal pelvicaliectasis remains with no gross hydronephrosis.  Left Kidney:  Length: 11.3 cm. Echogenicity within normal limits. No mass or hydronephrosis visualized.  Bladder:  There is a 258 cc postvoid residual. The bladder is are otherwise normal with bilateral ureteral jets identified.  IMPRESSION: 1. 258 cc postvoid residual in the  bladder. 2. Minimal pelvicaliectasis remains on the right with no gross hydronephrosis. No renal stones seen today.   Electronically Signed   By: Dorise Bullion III M.D   On: 07/27/2017 15:54    Renal ultrasound was personally reviewed today.  This was compared to previous CT scan.  No evidence of left-sided stone burden.   Assessment & Plan:    1. Nephrolithiasis S/p successful right ureteroscopy Renal ultrasound personally reviewed today, right-sided renal pelvic fullness minimal No evidence of residual stone burden bilaterally Left flank pain of unclear etiology, improving Stone analysis We discussed general stone prevention techniques including drinking plenty water with goal of producing 2.5 L urine daily, increased citric acid intake, avoidance of high oxalate containing foods, and decreased salt intake.  Information about dietary recommendations given today. F/u with KUB in 6 months - DG Abd 1 View; Future  2. Midline cystocele Symptomatic severe cystocele Requesting referral to alternative your gynecologist for further evaluation - Ambulatory referral to Urogynecology  3. Incomplete bladder emptying Bladder scan today shows incomplete bladder emptying, likely related to large cystocele Encouraged double voiding and timed voiding Follow-up for #2, will likely improve issue  Return in about 6 months (around 01/27/2018) for KUB.  Hollice Espy, MD  [facility] [address] [place], [place] [address] [phone]

## 2017-09-03 DIAGNOSIS — Z8619 Personal history of other infectious and parasitic diseases: Secondary | ICD-10-CM | POA: Diagnosis not present

## 2017-09-03 DIAGNOSIS — Z79891 Long term (current) use of opiate analgesic: Secondary | ICD-10-CM | POA: Diagnosis not present

## 2017-09-03 DIAGNOSIS — R5382 Chronic fatigue, unspecified: Secondary | ICD-10-CM | POA: Diagnosis not present

## 2017-10-29 DIAGNOSIS — R339 Retention of urine, unspecified: Secondary | ICD-10-CM | POA: Diagnosis not present

## 2017-10-29 DIAGNOSIS — N3946 Mixed incontinence: Secondary | ICD-10-CM | POA: Insufficient documentation

## 2017-10-29 DIAGNOSIS — N8110 Cystocele, unspecified: Secondary | ICD-10-CM | POA: Diagnosis not present

## 2017-10-29 DIAGNOSIS — N8111 Cystocele, midline: Secondary | ICD-10-CM | POA: Diagnosis not present

## 2017-12-07 DIAGNOSIS — M797 Fibromyalgia: Secondary | ICD-10-CM | POA: Diagnosis not present

## 2017-12-07 DIAGNOSIS — Z8619 Personal history of other infectious and parasitic diseases: Secondary | ICD-10-CM | POA: Diagnosis not present

## 2017-12-07 DIAGNOSIS — R5382 Chronic fatigue, unspecified: Secondary | ICD-10-CM | POA: Diagnosis not present

## 2017-12-29 ENCOUNTER — Encounter: Payer: Self-pay | Admitting: Family Medicine

## 2018-02-01 ENCOUNTER — Ambulatory Visit: Payer: 59 | Admitting: Urology

## 2018-03-08 DIAGNOSIS — R5382 Chronic fatigue, unspecified: Secondary | ICD-10-CM | POA: Diagnosis not present

## 2018-03-08 DIAGNOSIS — Z79891 Long term (current) use of opiate analgesic: Secondary | ICD-10-CM | POA: Diagnosis not present

## 2018-03-08 DIAGNOSIS — Z8619 Personal history of other infectious and parasitic diseases: Secondary | ICD-10-CM | POA: Diagnosis not present

## 2018-08-18 ENCOUNTER — Ambulatory Visit: Payer: Self-pay

## 2018-08-18 NOTE — Telephone Encounter (Signed)
Will watch for ER notes

## 2018-08-18 NOTE — Telephone Encounter (Signed)
Pt c/o "not being able to get enough air in" to the left lung area.  Pt has been running a fever for 5 days that ranges from 100.0 to 101.3. Pt called because she feels she is getting worse. Pt has been feeling symptoms: dizziness, head and chest congestion,cough, fever, nausea and headache. Pt stated that she has had difficulty getting her air in to the right lung in the past but cannot remember what was wrong. Pt has non productive cough, but denies shortness of breath. Pt advised to go to the ED for evaluation.  Reason for Disposition . [1] Fever > 101 F (38.3 C) AND [2] age > 59  Answer Assessment - Initial Assessment Questions 1. RESPIRATORY STATUS: "Describe your breathing?" (e.g., wheezing, shortness of breath, unable to speak, severe coughing)      Feels like she cannot get enough air in, NP cough, chest congestion 2. ONSET: "When did this breathing problem begin?"       5 days ago 3. PATTERN "Does the difficult breathing come and go, or has it been constant since it started?"      constant 4. SEVERITY: "How bad is your breathing?" (e.g., mild, moderate, severe)    - MILD: No SOB at rest, mild SOB with walking, speaks normally in sentences, can lay down, no retractions, pulse < 100.    - MODERATE: SOB at rest, SOB with minimal exertion and prefers to sit, cannot lie down flat, speaks in phrases, mild retractions, audible wheezing, pulse 100-120.    - SEVERE: Very SOB at rest, speaks in single words, struggling to breathe, sitting hunched forward, retractions, pulse > 120      moderate 5. RECURRENT SYMPTOM: "Have you had difficulty breathing before?" If so, ask: "When was the last time?" and "What happened that time?"      Yes- has trouble with right lung that cannot get enough air 6. CARDIAC HISTORY: "Do you have any history of heart disease?" (e.g., heart attack, angina, bypass surgery, angioplasty)      no 7. LUNG HISTORY: "Do you have any history of lung disease?"  (e.g., pulmonary  embolus, asthma, emphysema)     Has lung "issue" but pt does not know what it is  8. CAUSE: "What do you think is causing the breathing problem?"      Pt doesn't know 9. OTHER SYMPTOMS: "Do you have any other symptoms? (e.g., dizziness, runny nose, cough, chest pain, fever)     Dizziness, head and chest congestion, fever, nausea and headache, NP cough 10. PREGNANCY: "Is there any chance you are pregnant?" "When was your last menstrual period?"       n/a 11. TRAVEL: "Have you traveled out of the country in the last month?" (e.g., travel history, exposures)       no  Protocols used: BREATHING DIFFICULTY-A-AH

## 2018-08-25 ENCOUNTER — Encounter: Payer: Self-pay | Admitting: Family Medicine

## 2018-09-19 ENCOUNTER — Emergency Department: Payer: 59

## 2018-09-19 ENCOUNTER — Emergency Department
Admission: EM | Admit: 2018-09-19 | Discharge: 2018-09-19 | Disposition: A | Discharge status: Home / Self Care | Payer: 59 | Attending: Emergency Medicine | Admitting: Emergency Medicine

## 2018-09-19 ENCOUNTER — Other Ambulatory Visit: Payer: Self-pay

## 2018-09-19 ENCOUNTER — Encounter: Payer: Self-pay | Admitting: Emergency Medicine

## 2018-09-19 DIAGNOSIS — R0602 Shortness of breath: Secondary | ICD-10-CM | POA: Diagnosis present

## 2018-09-19 DIAGNOSIS — R0600 Dyspnea, unspecified: Secondary | ICD-10-CM

## 2018-09-19 LAB — CBC WITH DIFFERENTIAL/PLATELET
ABS IMMATURE GRANULOCYTES: 0.01 10*3/uL (ref 0.00–0.07)
BASOS ABS: 0.1 10*3/uL (ref 0.0–0.1)
Basophils Relative: 1 %
Eosinophils Absolute: 0.2 10*3/uL (ref 0.0–0.5)
Eosinophils Relative: 4 %
HEMATOCRIT: 40.9 % (ref 36.0–46.0)
Hemoglobin: 12.9 g/dL (ref 12.0–15.0)
IMMATURE GRANULOCYTES: 0 %
LYMPHS ABS: 1.7 10*3/uL (ref 0.7–4.0)
Lymphocytes Relative: 30 %
MCH: 26.8 pg (ref 26.0–34.0)
MCHC: 31.5 g/dL (ref 30.0–36.0)
MCV: 85 fL (ref 80.0–100.0)
Monocytes Absolute: 0.5 10*3/uL (ref 0.1–1.0)
Monocytes Relative: 9 %
NEUTROS PCT: 56 %
NRBC: 0 % (ref 0.0–0.2)
Neutro Abs: 3.2 10*3/uL (ref 1.7–7.7)
Platelets: 203 10*3/uL (ref 150–400)
RBC: 4.81 MIL/uL (ref 3.87–5.11)
RDW: 13.3 % (ref 11.5–15.5)
WBC: 5.7 10*3/uL (ref 4.0–10.5)

## 2018-09-19 LAB — BASIC METABOLIC PANEL
ANION GAP: 8 (ref 5–15)
BUN: 12 mg/dL (ref 8–23)
CALCIUM: 9.7 mg/dL (ref 8.9–10.3)
CO2: 31 mmol/L (ref 22–32)
Chloride: 99 mmol/L (ref 98–111)
Creatinine, Ser: 0.56 mg/dL (ref 0.44–1.00)
GFR calc non Af Amer: >60 mL/min (ref >60)
Glucose, Bld: 115 mg/dL — ABNORMAL HIGH (ref 70–99)
Potassium: 3.7 mmol/L (ref 3.5–5.1)
Sodium: 138 mmol/L (ref 135–145)

## 2018-09-19 LAB — TROPONIN I: Troponin I: <0.03 ng/mL (ref <0.03)

## 2018-09-19 MED ORDER — AZITHROMYCIN 250 MG PO TABS: ORAL_TABLET | ORAL | 0 refills | Status: DC

## 2018-09-19 MED ORDER — PREDNISONE 10 MG (21) PO TBPK: ORAL_TABLET | ORAL | 0 refills | Status: DC

## 2018-09-19 MED ORDER — IOHEXOL 350 MG/ML SOLN
75.0000 mL | Freq: Once | INTRAVENOUS | Status: AC | PRN
  Administered 2018-09-19: 75 mL via INTRAVENOUS

## 2018-09-19 NOTE — ED Notes (Signed)
Pt oxygen saturation 88% on RA. Pt placed on 2L nasal cannula. MD aware. Pt oxygen currently at 90% on 2L nasal cannula

## 2018-09-19 NOTE — ED Notes (Signed)
The patient was walked from room 6 to room 13. Her SPO2 started at 98% and decreased to 94%. She denies any SOB or dizziness. Just complained of being tired.

## 2018-09-19 NOTE — ED Notes (Signed)
Pt states she recently used a urine dipstick test at home and is "pretty sure she has a UTI as well". MD notified.

## 2018-09-19 NOTE — ED Notes (Signed)
Patient transported to X-ray 

## 2018-09-19 NOTE — ED Notes (Signed)
Pt placed on 3L nasal cannula. Oxygen saturation now at 97%. MD aware.

## 2018-09-19 NOTE — ED Notes (Signed)
Patient transported to CT 

## 2018-09-19 NOTE — ED Triage Notes (Signed)
Pt presents to ed from home via acems with complaint of breathing difficulty. Pt states she has been having trouble breathing for a couple of days now. Pt states this has progressively gotten worse and has gotten to the point that she feels like she cannot breathe when she lays down at night. Pt denies any cardiac hx. Pt took breathing tx at home before ems arrival and states she felt some relief. 20 G placed by ems in left AC. 4 mg zofran given in route. Pt in NAD at this time.

## 2018-09-19 NOTE — ED Provider Notes (Signed)
[facility] Emergency Department Provider Note       Time seen: ----------------------------------------- 12:47 PM on 09/19/2018 -----------------------------------------   I have reviewed the triage vital signs and the nursing notes.  HISTORY   Chief Complaint Shortness of Breath    HPI [name] Branch is a 62 y.o. female with a history of arthritis, chronic fatigue syndrome, chronic gastritis, hyperlipidemia, IBS, migraines, neuromuscular disorder who presents to the ED for shortness of breath.  Patient states she is been having trouble breathing for several days now.  She states this is progressively worsened over the last 4 weeks.  She is gotten the point where she cannot breathe when she lays down at night, denies any history of heart failure or cardiac history.  She denies fevers, chills or other complaints.  Past Medical History:  Diagnosis Date  . Allergic rhinitis   . Arthritis   . 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 Dr. Cristy Friedlander   . Chronic gastritis   . Dyspnea   . Hepatic steatosis   . History of kidney stones 05/2017  . Hyperlipidemia   . IBS (irritable bowel syndrome)   . Lyme disease    "stari"  . Migraine   . Muscle twitching   . Narcolepsy   . Neuromuscular disorder ([place])   . PONV (postoperative nausea and vomiting)   . Rectal prolapse   . Serrated adenoma of colon   . Umbilical hernia   . Vaginal prolapse   . West Nile Virus infection 2003    Patient Active Problem List   Diagnosis Date Noted  . Hydronephrosis with obstructing calculus   . Pyelonephritis 05/23/2017  . Frequent UTI 02/18/2016  . Abdominal pain, RUQ 02/18/2016  . Shortness of breath 01/29/2016  . Fever, unspecified 11/12/2015  . Tremor 11/12/2015  . Chronic nausea 11/12/2015  . UTI (urinary tract infection) 11/08/2015  . Productive cough 11/08/2015  . Constipation 04/12/2015  . Cystocele  04/12/2015  . Routine general medical examination at a health care facility 06/26/2014  . Osteopenia 06/26/2014  . Hyperglycemia 06/26/2014  . Colon cancer screening 06/26/2014  . Dysuria 06/26/2014  . Intertrigo 07/12/2013  . Chronic fatigue syndrome 01/19/2013  . Elevated alkaline phosphatase level 09/19/2012  . Edema 09/16/2012  . FREQUENCY, URINARY 08/06/2010  . Diarrhea 04/14/2010  . CHOLELITHIASIS 01/02/2009  . CONSTIPATION, CHRONIC 07/30/2008  . Irritable bowel syndrome 07/30/2008  . PURE HYPERCHOLESTEROLEMIA 06/21/2008  . POSTMENOPAUSAL STATUS 06/21/2008  . VAGINITIS, ATROPHIC 06/21/2008  . Pain in limb 06/21/2008    Past Surgical History:  Procedure Laterality Date  . 5th R toe fx  '09  . ABDOMINAL HYSTERECTOMY    . BREAST BIOPSY  '04   neg; right  . CHOLECYSTECTOMY  10/2011  . CYSTOSCOPY WITH STENT PLACEMENT Right 05/25/2017   Procedure: CYSTOSCOPY WITH STENT PLACEMENT;  Surgeon: Hollice Espy, MD;  Location: [facility] OR[facility];  Service: Urology;  Laterality: Right;  . CYSTOSCOPY WITH STENT PLACEMENT Right 06/21/2017   Procedure: CYSTOSCOPY WITH STENT EXCHANGE;  Surgeon: Hollice Espy, MD;  Location: [facility];  Service: Urology;  Laterality: Right;  . DIAGNOSTIC LAPAROSCOPY    . EXTRACORPOREAL SHOCK WAVE LITHOTRIPSY Right 06/10/2017   Procedure: EXTRACORPOREAL SHOCK WAVE LITHOTRIPSY (ESWL);  Surgeon: Hollice Espy, MD;  Location: [facility] OR[facility];  Service: Urology;  Laterality: Right;  . HOLMIUM LASER APPLICATION Right 2/42/6834   Procedure: HOLMIUM LASER APPLICATION;  Surgeon: Hollice Espy, MD;  Location: [facility];  Service: Urology;  Laterality: Right;  . laprascopy    . PARTIAL HYSTERECTOMY     bleeding  . TONSILLECTOMY    . TONSILLECTOMY    . ULNAR NERVE REPAIR     sx times 5  . UPPER GASTROINTESTINAL ENDOSCOPY  12/04/2004   gastritis  . URETEROSCOPY WITH HOLMIUM LASER LITHOTRIPSY Right 06/21/2017   Procedure: URETEROSCOPY WITH HOLMIUM LASER LITHOTRIPSY;  Surgeon:  Hollice Espy, MD;  Location: [facility] OR[facility];  Service: Urology;  Laterality: Right;    Allergies Sulfonamide derivatives  Social History Social History   Tobacco Use  . Smoking status: Never Smoker  . Smokeless tobacco: Never Used  . Tobacco comment: non smoker  Substance Use Topics  . Alcohol use: No    Alcohol/week: 0.0 standard drinks  . Drug use: No    Comment: history of opiate use abuse   Review of Systems Constitutional: Negative for fever. Cardiovascular: Negative for chest pain. Respiratory: Positive for shortness of breath Gastrointestinal: Negative for abdominal pain, vomiting and diarrhea. Musculoskeletal: Negative for back pain.  Positive for edema Skin: Negative for rash. Neurological: Negative for headaches, focal weakness or numbness.  All systems negative/normal/unremarkable except as stated in the HPI  ____________________________________________   PHYSICAL EXAM:  VITAL SIGNS: ED Triage Vitals  Enc Vitals Group     BP      Pulse      Resp      Temp      Temp src      SpO2      Weight      Height      Head Circumference      Peak Flow      Pain Score      Pain Loc      Pain Edu?      Excl. in [place]?    Constitutional: Alert and oriented. Well appearing and in no distress. Eyes: Conjunctivae are normal. Normal extraocular movements. ENT   Head: Normocephalic and atraumatic.   Nose: No congestion/rhinnorhea.   Mouth/Throat: Mucous membranes are moist.   Neck: No stridor. Cardiovascular: Rapid rate, regular rhythm. No murmurs, rubs, or gallops. Respiratory: Normal respiratory effort without tachypnea nor retractions. Breath sounds are clear and equal bilaterally. No wheezes/rales/rhonchi. Gastrointestinal: Soft and nontender. Normal bowel sounds Musculoskeletal: Nontender with normal range of motion in extremities. No lower extremity tenderness nor edema. Neurologic:  Normal speech and language. No gross focal neurologic deficits are  appreciated.  Skin:  Skin is warm, dry and intact. No rash noted. Psychiatric: Mood and affect are normal. Speech and behavior are normal.  ____________________________________________  EKG: Interpreted by me.  Sinus tachycardia with a rate of 123 bpm, right axis deviation, normal QRS, normal QT  ____________________________________________  ED COURSE:  As part of my medical decision making, I reviewed the following data within the [place] History obtained from family if available, nursing notes, old chart and ekg, as well as notes from prior ED visits. Patient presented for shortness of breath, we will assess with labs and imaging as indicated at this time.   Procedures ____________________________________________   LABS (pertinent positives/negatives)  Labs Reviewed  BASIC METABOLIC PANEL - Abnormal; Notable for the following components:      Result Value   Glucose, Bld 115 (*)    All other components within normal limits  CBC WITH DIFFERENTIAL/PLATELET  TROPONIN I  BRAIN NATRIURETIC PEPTIDE    RADIOLOGY Images were viewed by me  Chest x-ray IMPRESSION: 1.  No acute findings. No pulmonary embolism seen, with mild study limitations detailed above. No aortic aneurysm or evidence of aortic dissection. No pneumonia or pulmonary edema. 2. Mild bibasilar atelectasis. 3. Fatty infiltration of the liver. ____________________________________________  DIFFERENTIAL DIAGNOSIS   CHF, pneumonia, anemia, electrolyte abnormality, dehydration, PE  FINAL ASSESSMENT AND PLAN  Dyspnea   Plan: The patient had presented for shortness of breath. Patient's labs did not reveal any acute process. Patient's imaging also negative, no PE, no pneumonia or pulmonary edema.  Unclear etiology for her shortness of breath.  May try antibiotics and steroids but overall she is cleared for outpatient follow-up.   Laurence Aly, MD   Note: This note was generated in  part or whole with voice recognition software. Voice recognition is usually quite accurate but there are transcription errors that can and very often do occur. I apologize for any typographical errors that were not detected and corrected.     Earleen Newport, MD 09/19/18 [phone]

## 2018-09-21 ENCOUNTER — Telehealth: Payer: Self-pay

## 2018-09-21 NOTE — Telephone Encounter (Signed)
Please check in with her next week to see how she is feeling -thanks  If unreachable- understood

## 2018-09-21 NOTE — Telephone Encounter (Signed)
Per discharge summary, patient is to schedule ED f/u appt with PCP. Attempted to reach patient on mobile phone. Spouse answered and stated patient was at home. Attempted to reach patient but there was no answer on home phone. Unable to leave message.  Encounter being routed to PCP and assigned CMA for follow-up as needed.

## 2018-09-27 NOTE — Telephone Encounter (Signed)
Left VM on home # requesting pt to call the office back and schedule f/u with Dr. Glori Bickers and to update [place] on how she's doing

## 2018-10-06 ENCOUNTER — Encounter: Payer: Self-pay | Admitting: Family Medicine

## 2018-10-06 DIAGNOSIS — R0602 Shortness of breath: Secondary | ICD-10-CM

## 2018-10-10 NOTE — Telephone Encounter (Signed)
Ref done for pulmonary (pt pref)  Will route to [facility]

## 2018-10-17 ENCOUNTER — Institutional Professional Consult (permissible substitution): Payer: 59 | Admitting: Pulmonary Disease

## 2018-10-17 ENCOUNTER — Ambulatory Visit: Payer: 59 | Admitting: Pulmonary Disease

## 2018-10-17 ENCOUNTER — Encounter: Payer: Self-pay | Admitting: Pulmonary Disease

## 2018-10-17 VITALS — BP 140/80 | HR 124 | Ht 66.0 in | Wt 194.8 lb

## 2018-10-17 DIAGNOSIS — E669 Obesity, unspecified: Secondary | ICD-10-CM | POA: Diagnosis not present

## 2018-10-17 DIAGNOSIS — R000 Tachycardia, unspecified: Secondary | ICD-10-CM | POA: Diagnosis not present

## 2018-10-17 DIAGNOSIS — R0600 Dyspnea, unspecified: Secondary | ICD-10-CM | POA: Diagnosis not present

## 2018-10-17 DIAGNOSIS — M40204 Unspecified kyphosis, thoracic region: Secondary | ICD-10-CM | POA: Diagnosis not present

## 2018-10-17 NOTE — Patient Instructions (Signed)
1.  We will schedule you for full breathing tests as well as an echocardiogram to evaluate your shortness of breath.  2.  We will see you in follow-up in 4 weeks time or thereabouts call sooner if any new issues arise.

## 2018-10-17 NOTE — Progress Notes (Signed)
Subjective:    Patient ID: [name] Branch, female    DOB: Apr 19, 1956, 62 y.o.   MRN: [id number]  HPI Patient is a 62 year old lifelong never smoker who presents for evaluation of dyspnea on 5 years duration.  She is kindly referred by Dr.MarneTower.  The patient dates that she has the sensation that she cannot get enough air in.  Patient states that the symptoms started [place].  Initially she states that she felt the sensation mostly on her right side and now it is throughout her chest.  She states that hot and humid air make her sensation worse.  She was evaluated by Dr. Wallene Huh for this issue in February 2016.  He has not had any chest pain per se.  No cough or sputum production and no hemoptysis.  She has not had any fevers, chills or sweats.  She has not had any paroxysmal nocturnal dyspnea.  Patient has had orthopnea for approximately 2 years.  She states that she has to sleep propped up.  She has been worked up extensively for connective tissue disease.  Has been given the diagnosis of asthma in the past.  She uses an albuterol inhaler with mixed results with regards to her dyspnea.  I have reviewed her available imaging she had a chest x-ray performed on November 25 that showed significant kyphosis mild hyperinflation.  CT angios chest was performed on 25 November it was a limited study due to the patient being claustrophobic.  Very whether she has mild interstitial marking increase but mostly just faint atelectatic changes at the bases.  Patient's past medical history, surgical history and family history have been reviewed in detail.  As noted she has never smoked.  She works with [employer].  She has not traveled outside of the country, she has resided in [place].  No military time.  Pets in the home.  Review of Systems  Constitutional: Positive for fatigue.  HENT: Negative.   Respiratory: Positive for chest tightness and shortness of breath.   Cardiovascular: Positive for leg  swelling.       Orthopnea.  Gastrointestinal: Negative.   Endocrine: Positive for heat intolerance.  Genitourinary: Negative.   Musculoskeletal: Positive for arthralgias, joint swelling and myalgias.  Skin: Negative.   Allergic/Immunologic: Negative.   Neurological: Positive for weakness.  Hematological: Negative.   Psychiatric/Behavioral: Negative.   All other systems reviewed and are negative.      Objective:   Physical Exam Vitals signs (Significant tachycardia appears to be present on prior visits.) reviewed.  Constitutional:      General: She is not in acute distress.    Appearance: She is well-developed. She is obese. She is ill-appearing (Chronically ill-appearing.). She is not toxic-appearing.  HENT:     Head: Normocephalic and atraumatic.     Mouth/Throat:     Mouth: Mucous membranes are moist.     Pharynx: Oropharynx is clear.  Eyes:     Extraocular Movements: Extraocular movements intact.     Pupils: Pupils are equal, round, and reactive to light.  Neck:     Musculoskeletal: Neck supple.     Thyroid: No thyromegaly.     Vascular: No JVD.     Trachea: No tracheal deviation.  Cardiovascular:     Rate and Rhythm: Regular rhythm. Tachycardia present.  No extrasystoles are present.    Pulses: Normal pulses.     Heart sounds: Normal heart sounds.  Pulmonary:     Effort: Pulmonary effort is normal.  Breath sounds: Normal breath sounds.  Chest:     Chest wall: Deformity (Significant kyphosis.  Query scoliosis.) present.  Abdominal:     Palpations: Abdomen is soft.  Musculoskeletal:     Right lower leg: No edema.     Left lower leg: No edema.     Comments: Ambulates with cane.  Lymphadenopathy:     Cervical: No cervical adenopathy.  Skin:    General: Skin is warm and dry.  Neurological:     General: No focal deficit present.     Mental Status: She is alert and oriented to person, place, and time.  Psychiatric:        Mood and Affect: Affect is blunt and  flat.    Spirometry was performed today and is consistent with potential restrictive physiology.  Patient's effort was suboptimal.       Assessment & Plan:   1.  Dyspnea: Suspect this may be related to restrictive physiology likely due to kyphosis cannot exclude mild obstructive defect which may be "canceled out" by restriction.  She will need formal PFTs.  In addition she appears to be significantly tachycardic and this can add to the sensation of dyspnea.  2.  Tachycardia: She has tachycardia associated with dyspnea, will obtain 2D echo to evaluate for potential pulmonary hypertension or other causes that may be leading to this.  Ultimately this could be related to deconditioning.  Recommend consideration of calcium channel blocker or beta-blocker for management of this.  This certainly can be aggravating her dyspnea.  3.  Obesity: This issue adds complexity to her management.  It was recommended that she and engage in weight loss.  4.  Kyphosis: This issue adds complexity to her management and can be leading to restrictive physiology.  We will reconvene in 4 to 6 weeks time to reassess with the studies ordered above.  She is to contact us prior to that time should any new difficulties arise.  Enck you for allowing me to participate in this patient's care.

## 2018-11-08 ENCOUNTER — Ambulatory Visit: Payer: 59

## 2018-11-14 ENCOUNTER — Ambulatory Visit: Payer: 59 | Admitting: Pulmonary Disease

## 2018-11-24 ENCOUNTER — Ambulatory Visit: Payer: 59

## 2018-12-14 ENCOUNTER — Ambulatory Visit: Payer: 59 | Admitting: Pulmonary Disease

## 2018-12-15 DIAGNOSIS — R0600 Dyspnea, unspecified: Secondary | ICD-10-CM

## 2018-12-20 ENCOUNTER — Ambulatory Visit (HOSPITAL_COMMUNITY): Payer: 59

## 2018-12-20 ENCOUNTER — Ambulatory Visit
Admission: RE | Admit: 2018-12-20 | Discharge: 2018-12-20 | Disposition: A | Discharge status: Home / Self Care | Payer: 59 | Source: Ambulatory Visit | Attending: Pulmonary Disease | Admitting: Pulmonary Disease

## 2018-12-20 DIAGNOSIS — R0600 Dyspnea, unspecified: Secondary | ICD-10-CM

## 2018-12-20 DIAGNOSIS — E785 Hyperlipidemia, unspecified: Secondary | ICD-10-CM | POA: Insufficient documentation

## 2018-12-20 DIAGNOSIS — I351 Nonrheumatic aortic (valve) insufficiency: Secondary | ICD-10-CM | POA: Insufficient documentation

## 2018-12-20 NOTE — Addendum Note (Signed)
Addended by: Vivia Ewing on: 12/20/2018 03:21 PM   Modules accepted: Orders

## 2018-12-20 NOTE — Progress Notes (Signed)
*  PRELIMINARY RESULTS* Echocardiogram 2D Echocardiogram has been performed.  [name] Branch 12/20/2018, 11:04 AM

## 2018-12-23 ENCOUNTER — Ambulatory Visit: Payer: 59 | Admitting: Pulmonary Disease

## 2018-12-23 ENCOUNTER — Encounter: Payer: Self-pay | Admitting: Pulmonary Disease

## 2018-12-23 VITALS — BP 132/68 | HR 114 | Resp 16 | Ht 66.0 in | Wt 200.0 lb

## 2018-12-23 DIAGNOSIS — M40204 Unspecified kyphosis, thoracic region: Secondary | ICD-10-CM

## 2018-12-23 DIAGNOSIS — R0600 Dyspnea, unspecified: Secondary | ICD-10-CM | POA: Diagnosis not present

## 2018-12-23 DIAGNOSIS — E669 Obesity, unspecified: Secondary | ICD-10-CM | POA: Diagnosis not present

## 2018-12-23 DIAGNOSIS — R000 Tachycardia, unspecified: Secondary | ICD-10-CM

## 2018-12-23 MED ORDER — FLUTICASONE FUROATE-VILANTEROL 200-25 MCG/INH IN AEPB: 1.0000 | INHALATION_SPRAY | Freq: Every day | RESPIRATORY_TRACT | Status: DC

## 2018-12-23 MED ORDER — FLUTICASONE FUROATE-VILANTEROL 100-25 MCG/INH IN AEPB: 1.0000 | INHALATION_SPRAY | Freq: Every day | RESPIRATORY_TRACT | 0 refills | Status: DC

## 2018-12-23 NOTE — Progress Notes (Signed)
Subjective:    Patient ID: [name] Branch, female    DOB: [birth year], 63 y.o.   MRN: [id number]  HPI Patient is a 63 year old lifelong never smoker who presents for follow-up on the issue of dyspnea of 5 years duration.  She was initially evaluated on 23 December.  She states her dyspnea is mostly a sensation that she cannot get enough air in.  In July she started to feel that she had inability to get air mostly to the right side of her chest.  She noted that hot and humid air made the sensation worse.  This has somewhat improved but her sensation of inability to get air in persists.  She recently had had an upper respiratory infection and this was with associated cough.  This is now improving.  Since her upper respiratory infection she has not had any fevers, chills or sweats.  No purulent sputum production no hemoptysis.  She has had orthopnea for approximately 2 years and this persists.  She has not had any paroxysmal nocturnal dyspnea.  She has to sleep propped up.  She has had extensive work-up for potential connective tissue disease in the past.  She has been given the diagnosis of asthma in the past and has been provided with albuterol inhaler which she notes occasionally helps.  The improvement with the inhaler is not consistent.  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  She had pulmonary function testing performed on 25 February which was essentially normal.  Did not meet criteria for bronchodilator challenge as it was normal.  She had a 2D echo performed on 25 February that showed a hyperdynamic left ventricle with ejection fraction of over 65%.  Cavity size was normal.  Right-sided structures were normal.  Of note the patient is tachycardic and has been so noted previously.  Patient is also on long-acting morphine for chronic pain I reminded her that this can also give her the sensation of dyspnea.         Review of Systems    Constitutional: Positive for fatigue.  HENT: Negative.   Respiratory: Positive for chest tightness and shortness of breath.   Cardiovascular: Positive for leg swelling.       Orthopnea.  Gastrointestinal: Negative.   Endocrine: Negative.   Genitourinary: Negative.   Musculoskeletal: Positive for arthralgias, joint swelling and myalgias.  Skin: Negative.   Allergic/Immunologic: Negative.   Neurological: Positive for weakness.  Hematological: Negative.   Psychiatric/Behavioral: Negative.   All other systems reviewed and are negative.      Objective:   Physical Exam Vitals signs (Significant tachycardia appears to be present on prior visits.) reviewed.  Constitutional:      General: She is not in acute distress.    Appearance: She is well-developed. She is obese. She is ill-appearing (Chronically ill-appearing.). She is not toxic-appearing.  HENT:     Head: Normocephalic and atraumatic.     Mouth/Throat:     Mouth: Mucous membranes are moist.     Pharynx: Oropharynx is clear.  Eyes:     Pupils: Pupils are equal, round, and reactive to light.  Neck:     Musculoskeletal: Neck supple.     Vascular: No JVD.     Trachea: No tracheal deviation.  Cardiovascular:     Rate and Rhythm: Regular rhythm. Tachycardia present.     Pulses: Normal pulses.     Heart sounds: Normal heart sounds.  Pulmonary:  Effort: Pulmonary effort is normal. No tachypnea.     Breath sounds: Examination of the right-upper field reveals wheezing. Examination of the left-upper field reveals wheezing. Wheezing (Faint) present.     Comments: Coarse Chest:     Chest wall: Deformity (Significant kyphosis.  Query scoliosis.) present.  Abdominal:     Palpations: Abdomen is soft.  Musculoskeletal:     Right lower leg: No edema.     Left lower leg: No edema.     Comments: Ambulates with cane.  Skin:    General: Skin is warm and dry.  Neurological:     General: No focal deficit present.     Mental Status:  She is alert and oriented to person, place, and time.  Psychiatric:        Mood and Affect: Affect is blunt and flat.           Assessment & Plan:   1.  Dyspnea: Suspect this may be related to restrictive physiology likely due to kyphosis cannot exclude mild obstructive defect which may be "canceled out" by restriction.  Formal PFTs did not show evidence of obstruction.  She is noted to have some very mild wheezing today and it is likely that she may have an element of reactive airways disease.  We will give her a trial of Brio Ellipta 100/25, 1 inhalation daily to see if this has any impact on her symptoms.  However if she has any airways reactivity this appears to be minimal.  Main issues with her dyspnea are related to her kyphoscoliosis, chronic use of narcotics and ongoing issues with tachycardia.  We will see her in follow-up in 3 months time she is to contact us prior to that time should any new difficulties arise.  2.  Tachycardia: She has tachycardia associated with dyspnea, tachycardia can cause dyspnea due to decreased cardiac filling.  Patient appears to have a noncompliant hyperdynamic left ventricle.  She may benefit from a calcium channel blocker such as verapamil to help with the tachycardia and allow for left ventricular relaxation.  I will defer to her primary care physician to prescribe this if she feels it appropriate.  3.  Obesity: This issue adds complexity to her management.  It was recommended that she and engage in weight loss.  4.  Kyphosis: This issue adds complexity to her management and can be leading to restrictive physiology.   This chart was dictated using voice recognition software/Dragon.  Despite best efforts to proofread, errors can occur which can change the meaning.  Any change was purely unintentional.

## 2018-12-23 NOTE — Patient Instructions (Signed)
1. We will give you a trial of Brio Ellipta 1 puff daily to see if this helps with your mild reactive airways.  2.  You may need medication to treat your tachycardia this may help with your breathing.  3.  We will see you in follow-up in 3 months time.  Call sooner should any new difficulties arise.

## 2018-12-23 NOTE — Progress Notes (Signed)
Patient seen in the office today and instructed on use of Breo 100.  Patient expressed understanding and demonstrated technique. 

## 2019-04-07 ENCOUNTER — Telehealth: Payer: Self-pay

## 2019-04-07 MED ORDER — ONDANSETRON 4 MG PO TBDP: 4.0000 mg | ORAL_TABLET | Freq: Three times a day (TID) | ORAL | 0 refills | Status: DC | PRN

## 2019-04-07 NOTE — Telephone Encounter (Signed)
Pt notified Rx sent and advised of Dr. Marliss Coots recommendations and verbalized understanding

## 2019-04-07 NOTE — Telephone Encounter (Signed)
I spoke with pt and few hours ago pt started with severe N&V,diarrhea,no mucus or blood,lower stomach cramps with diarrhea,pts temp 100.3; pt had different foods for supper that were prepared at home.pt has some chills,and slight H/A. No S/T,cough,SOB,muscle pain,and no loss of taste/smell. No travel and no known exposure to Covid. Pt request zofran to [facility]. Pt will cb to reestablish care

## 2019-04-07 NOTE — Telephone Encounter (Signed)
I sent it  Please keep me posted  Go to ER if severe abd pain or if s/s of dehydration or no improvement

## 2019-04-11 ENCOUNTER — Encounter: Payer: Self-pay | Admitting: Internal Medicine

## 2019-10-18 ENCOUNTER — Emergency Department
Admission: EM | Admit: 2019-10-18 | Discharge: 2019-10-18 | Disposition: A | Discharge status: Home / Self Care | Payer: 59 | Attending: Emergency Medicine | Admitting: Emergency Medicine

## 2019-10-18 ENCOUNTER — Encounter: Payer: Self-pay | Admitting: Emergency Medicine

## 2019-10-18 ENCOUNTER — Other Ambulatory Visit: Payer: Self-pay

## 2019-10-18 DIAGNOSIS — R1011 Right upper quadrant pain: Secondary | ICD-10-CM | POA: Insufficient documentation

## 2019-10-18 DIAGNOSIS — Z5321 Procedure and treatment not carried out due to patient leaving prior to being seen by health care provider: Secondary | ICD-10-CM | POA: Insufficient documentation

## 2019-10-18 LAB — URINALYSIS, COMPLETE (UACMP) WITH MICROSCOPIC
Bilirubin Urine: NEGATIVE
Glucose, UA: NEGATIVE mg/dL
Hgb urine dipstick: NEGATIVE
Ketones, ur: NEGATIVE mg/dL
Nitrite: NEGATIVE
Protein, ur: NEGATIVE mg/dL
Specific Gravity, Urine: 1.018 (ref 1.005–1.030)
pH: 6 (ref 5.0–8.0)

## 2019-10-18 LAB — COMPREHENSIVE METABOLIC PANEL
ALT: 28 U/L (ref 0–44)
AST: 57 U/L — ABNORMAL HIGH (ref 15–41)
Albumin: 3.8 g/dL (ref 3.5–5.0)
Alkaline Phosphatase: 110 U/L (ref 38–126)
Anion gap: 14 (ref 5–15)
BUN: 9 mg/dL (ref 8–23)
CO2: 23 mmol/L (ref 22–32)
Calcium: 9.3 mg/dL (ref 8.9–10.3)
Chloride: 98 mmol/L (ref 98–111)
Creatinine, Ser: 0.58 mg/dL (ref 0.44–1.00)
GFR calc Af Amer: >60 mL/min (ref >60)
GFR calc non Af Amer: >60 mL/min (ref >60)
Glucose, Bld: 114 mg/dL — ABNORMAL HIGH (ref 70–99)
Potassium: 3.7 mmol/L (ref 3.5–5.1)
Sodium: 135 mmol/L (ref 135–145)
Total Bilirubin: 1.8 mg/dL — ABNORMAL HIGH (ref 0.3–1.2)
Total Protein: 8 g/dL (ref 6.5–8.1)

## 2019-10-18 LAB — CBC
HCT: 39.4 % (ref 36.0–46.0)
Hemoglobin: 12.5 g/dL (ref 12.0–15.0)
MCH: 27.4 pg (ref 26.0–34.0)
MCHC: 31.7 g/dL (ref 30.0–36.0)
MCV: 86.2 fL (ref 80.0–100.0)
Platelets: 176 10*3/uL (ref 150–400)
RBC: 4.57 MIL/uL (ref 3.87–5.11)
RDW: 14.1 % (ref 11.5–15.5)
WBC: 5.4 10*3/uL (ref 4.0–10.5)
nRBC: 0 % (ref 0.0–0.2)

## 2019-10-18 LAB — LIPASE, BLOOD: Lipase: 22 U/L (ref 11–51)

## 2019-10-18 NOTE — ED Notes (Signed)
Pt reports leaving now due to long wait

## 2019-10-18 NOTE — ED Triage Notes (Signed)
Pt in via EMS from home with c/o RUQ abd pain for 2 weeks, constant with changes in the severity based on how she moves. EMS reports pt was told she had a hernia and is wondering if that is causing it.

## 2019-10-18 NOTE — ED Triage Notes (Signed)
Patient presents to the ED with upper right sided abdominal pain, under patient's right breast.  Patient states she has had minimal discomfort to area x 2 weeks but this evening began to have sharp, stabbing, severe pain and nausea.  Denies vomiting and diarrhea.

## 2019-10-23 ENCOUNTER — Telehealth: Payer: Self-pay

## 2019-10-23 NOTE — Telephone Encounter (Signed)
Pt went to [facility] ED on 10/18/19 and left without being seen; pt said has gone from dull rt lower side pain to an excruciating pain in rt lower side; pain level at least a 7. Pt had virtual visit at [facility] and has started an abx and was told she had a UTI but pt said it may take time for abx to start working but pain level now is excruciating. Pt to go to ED for eval now and pt voiced understanding and will go back to ED. Pt last seen [facility] 02/18/2016; pt will cb after gets pain under control to reestablish as pt. FYI to Dr Glori Bickers.

## 2019-10-23 NOTE — Telephone Encounter (Signed)
I agree with advisement to go to ED Will watch for correspondence

## 2020-07-20 ENCOUNTER — Emergency Department: Payer: 59

## 2020-07-20 ENCOUNTER — Emergency Department
Admission: EM | Admit: 2020-07-20 | Discharge: 2020-07-21 | Disposition: A | Discharge status: Home / Self Care | Payer: 59 | Attending: Emergency Medicine | Admitting: Emergency Medicine

## 2020-07-20 ENCOUNTER — Encounter: Payer: Self-pay | Admitting: Emergency Medicine

## 2020-07-20 ENCOUNTER — Other Ambulatory Visit: Payer: Self-pay

## 2020-07-20 DIAGNOSIS — K7460 Unspecified cirrhosis of liver: Secondary | ICD-10-CM

## 2020-07-20 DIAGNOSIS — R599 Enlarged lymph nodes, unspecified: Secondary | ICD-10-CM

## 2020-07-20 DIAGNOSIS — R591 Generalized enlarged lymph nodes: Secondary | ICD-10-CM | POA: Insufficient documentation

## 2020-07-20 DIAGNOSIS — N3001 Acute cystitis with hematuria: Secondary | ICD-10-CM | POA: Diagnosis not present

## 2020-07-20 DIAGNOSIS — R1011 Right upper quadrant pain: Secondary | ICD-10-CM | POA: Diagnosis present

## 2020-07-20 LAB — COMPREHENSIVE METABOLIC PANEL
ALT: 30 U/L (ref 0–44)
AST: 55 U/L — ABNORMAL HIGH (ref 15–41)
Albumin: 3.8 g/dL (ref 3.5–5.0)
Alkaline Phosphatase: 110 U/L (ref 38–126)
Anion gap: 10 (ref 5–15)
BUN: 8 mg/dL (ref 8–23)
CO2: 28 mmol/L (ref 22–32)
Calcium: 9.1 mg/dL (ref 8.9–10.3)
Chloride: 99 mmol/L (ref 98–111)
Creatinine, Ser: 0.5 mg/dL (ref 0.44–1.00)
GFR calc Af Amer: >60 mL/min (ref >60)
GFR calc non Af Amer: >60 mL/min (ref >60)
Glucose, Bld: 102 mg/dL — ABNORMAL HIGH (ref 70–99)
Potassium: 3.8 mmol/L (ref 3.5–5.1)
Sodium: 137 mmol/L (ref 135–145)
Total Bilirubin: 1.7 mg/dL — ABNORMAL HIGH (ref 0.3–1.2)
Total Protein: 7.8 g/dL (ref 6.5–8.1)

## 2020-07-20 LAB — CBC
HCT: 43.4 % (ref 36.0–46.0)
Hemoglobin: 13.5 g/dL (ref 12.0–15.0)
MCH: 27.7 pg (ref 26.0–34.0)
MCHC: 31.1 g/dL (ref 30.0–36.0)
MCV: 88.9 fL (ref 80.0–100.0)
Platelets: 147 10*3/uL — ABNORMAL LOW (ref 150–400)
RBC: 4.88 MIL/uL (ref 3.87–5.11)
RDW: 13.7 % (ref 11.5–15.5)
WBC: 5 10*3/uL (ref 4.0–10.5)
nRBC: 0 % (ref 0.0–0.2)

## 2020-07-20 LAB — URINALYSIS, COMPLETE (UACMP) WITH MICROSCOPIC
Bilirubin Urine: NEGATIVE
Glucose, UA: NEGATIVE mg/dL
Hgb urine dipstick: NEGATIVE
Ketones, ur: NEGATIVE mg/dL
Nitrite: POSITIVE — AB
Protein, ur: NEGATIVE mg/dL
Specific Gravity, Urine: 1.011 (ref 1.005–1.030)
WBC, UA: >50 WBC/hpf — ABNORMAL HIGH (ref 0–5)
pH: 6 (ref 5.0–8.0)

## 2020-07-20 LAB — LIPASE, BLOOD: Lipase: 28 U/L (ref 11–51)

## 2020-07-20 MED ORDER — LACTATED RINGERS IV BOLUS
1000.0000 mL | Freq: Once | INTRAVENOUS | Status: AC
  Administered 2020-07-21: 1000 mL via INTRAVENOUS

## 2020-07-20 MED ORDER — ONDANSETRON HCL 4 MG/2ML IJ SOLN
4.0000 mg | Freq: Once | INTRAMUSCULAR | Status: AC
  Administered 2020-07-21: 4 mg via INTRAVENOUS
  Filled 2020-07-20: qty 2

## 2020-07-20 MED ORDER — KETOROLAC TROMETHAMINE 30 MG/ML IJ SOLN
15.0000 mg | Freq: Once | INTRAMUSCULAR | Status: AC
  Administered 2020-07-21: 15 mg via INTRAVENOUS
  Filled 2020-07-20: qty 1

## 2020-07-20 MED ORDER — SODIUM CHLORIDE 0.9 % IV SOLN
1.0000 g | Freq: Once | INTRAVENOUS | Status: AC
  Administered 2020-07-21: 1 g via INTRAVENOUS
  Filled 2020-07-20: qty 10

## 2020-07-20 NOTE — ED Triage Notes (Signed)
Pt to ED via [facility] from home for RUQ  abd pain, nausea x 3 days, and vomiting that started today. Pt has vomited 3 times. Pt is in NAD. Per EMS pt was tachy in the 120's, CBG 160. Pt has 20 G IV in right forearm and was given Zofran 4 mg.

## 2020-07-20 NOTE — ED Notes (Signed)
Lab called to say green top hemolyzed, asked lab to come recollect.

## 2020-07-21 ENCOUNTER — Emergency Department: Payer: 59

## 2020-07-21 LAB — LACTIC ACID, PLASMA: Lactic Acid, Venous: 1.5 mmol/L (ref 0.5–1.9)

## 2020-07-21 MED ORDER — CEPHALEXIN 500 MG PO CAPS: 500.0000 mg | ORAL_CAPSULE | Freq: Three times a day (TID) | ORAL | 0 refills | Status: AC

## 2020-07-21 MED ORDER — ONDANSETRON 4 MG PO TBDP: 4.0000 mg | ORAL_TABLET | Freq: Three times a day (TID) | ORAL | 0 refills | Status: DC | PRN

## 2020-07-21 NOTE — ED Notes (Signed)
Iv is infiltrating. Fluids stopped, small amount of swelling noted at insertion site.

## 2020-07-21 NOTE — Discharge Instructions (Signed)
Your CT showed several abnormalities including possible cirrhosis of the liver and enlarged lymph nodes inside your abdomen.  With your history of bladder cancer it is important that you see your urologist for further evaluation to ensure that the cancer is not back.  You also need to see your primary care doctor to be worked up for the cirrhosis of the liver.  Likely you do not have a kidney stone and your urinary tract infection may be treated at home with oral antibiotics.  Make sure to take the prescription fully for 7 days even if you feel better.  Return to the emergency room for new or worsening abdominal pain, flank pain, or fever.  Otherwise follow-up with your doctor in 2 to 3 days.

## 2020-07-21 NOTE — ED Provider Notes (Signed)
[facility] Emergency Department Provider Note  ____________________________________________  Time seen: Approximately 12:04 AM  I have reviewed the triage vital signs and the nursing notes.   HISTORY  Chief Complaint Abdominal Pain   HPI Shantea Poulton is a 64 y.o. female with a history of bladder prolapse and recurrent UTIs, chronic Lyme disease, chronic pain, chronic fatigue syndrome, IBS, kidney stones who presents for evaluation of nausea and vomiting.  Is complaining of several episodes of nausea and nonbloody nonbilious emesis that started this morning.  Since receiving Zofran in triage the vomiting has subsided.  No diarrhea or constipation, no fever or chills, no chest pain or shortness of breath, no dysuria or hematuria, no flank pain.  Patient reports months of constant dull mild right upper quadrant abdominal pain which is unchanged at this time.  She is status post cholecystectomy.  Past Medical History:  Diagnosis Date  . Allergic rhinitis   . Arthritis   . 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 Dr. Cristy Friedlander   . Chronic gastritis   . Dyspnea   . Hepatic steatosis   . History of kidney stones 05/2017  . Hyperlipidemia   . IBS (irritable bowel syndrome)   . Lyme disease    "stari"  . Migraine   . Muscle twitching   . Narcolepsy   . Neuromuscular disorder ([place])   . PONV (postoperative nausea and vomiting)   . Rectal prolapse   . Serrated adenoma of colon   . Umbilical hernia   . Vaginal prolapse   . West Nile Virus infection 2003    Patient Active Problem List   Diagnosis Date Noted  . Mixed stress and urge urinary incontinence 10/29/2017  . Hydronephrosis with obstructing calculus   . Pyelonephritis 05/23/2017  . Frequent UTI 02/18/2016  . Abdominal pain, RUQ 02/18/2016  . Dyspnea 01/29/2016  . Fever, unspecified 11/12/2015  . Tremor 11/12/2015  . Chronic nausea  11/12/2015  . UTI (urinary tract infection) 11/08/2015  . Productive cough 11/08/2015  . Constipation 04/12/2015  . Cystocele 04/12/2015  . Routine general medical examination at a health care facility 06/26/2014  . Osteopenia 06/26/2014  . Hyperglycemia 06/26/2014  . Colon cancer screening 06/26/2014  . Dysuria 06/26/2014  . Intertrigo 07/12/2013  . Chronic fatigue syndrome 01/19/2013  . Elevated alkaline phosphatase level 09/19/2012  . Edema 09/16/2012  . FREQUENCY, URINARY 08/06/2010  . Diarrhea 04/14/2010  . CHOLELITHIASIS 01/02/2009  . CONSTIPATION, CHRONIC 07/30/2008  . Irritable bowel syndrome 07/30/2008  . PURE HYPERCHOLESTEROLEMIA 06/21/2008  . POSTMENOPAUSAL STATUS 06/21/2008  . VAGINITIS, ATROPHIC 06/21/2008  . Pain in limb 06/21/2008    Past Surgical History:  Procedure Laterality Date  . 5th R toe fx  '09  . ABDOMINAL HYSTERECTOMY    . BREAST BIOPSY  '04   neg; right  . CHOLECYSTECTOMY  10/2011  . CYSTOSCOPY WITH STENT PLACEMENT Right 05/25/2017   Procedure: CYSTOSCOPY WITH STENT PLACEMENT;  Surgeon: Hollice Espy, MD;  Location: [facility];  Service: Urology;  Laterality: Right;  . CYSTOSCOPY WITH STENT PLACEMENT Right 06/21/2017   Procedure: CYSTOSCOPY WITH STENT EXCHANGE;  Surgeon: Hollice Espy, MD;  Location: [facility];  Service: Urology;  Laterality: Right;  . DIAGNOSTIC LAPAROSCOPY    . EXTRACORPOREAL SHOCK WAVE LITHOTRIPSY Right 06/10/2017   Procedure: EXTRACORPOREAL SHOCK WAVE LITHOTRIPSY (ESWL);  Surgeon: Hollice Espy, MD;  Location: [facility];  Service: Urology;  Laterality: Right;  .  HOLMIUM LASER APPLICATION Right [id number]/7106   Procedure: HOLMIUM LASER APPLICATION;  Surgeon: Hollice Espy, MD;  Location: [facility];  Service: Urology;  Laterality: Right;  . laprascopy    . PARTIAL HYSTERECTOMY     bleeding  . TONSILLECTOMY    . TONSILLECTOMY    . ULNAR NERVE REPAIR     sx times 5  . UPPER GASTROINTESTINAL ENDOSCOPY  12/04/2004   gastritis  .  URETEROSCOPY WITH HOLMIUM LASER LITHOTRIPSY Right 06/21/2017   Procedure: URETEROSCOPY WITH HOLMIUM LASER LITHOTRIPSY;  Surgeon: Hollice Espy, MD;  Location: [facility];  Service: Urology;  Laterality: Right;    Prior to Admission medications   Medication Sig Start Date End Date Taking? Authorizing Provider  butalbital-acetaminophen-caffeine (FIORICET, ESGIC) 50-325-40 MG tablet Take by mouth.    [provider]  Calcium Polycarbophil (FIBER-CAPS PO) Take 1 capsule by mouth 2 (two) times daily.    [provider]  cephALEXin (KEFLEX) 500 MG capsule Take 1 capsule (500 mg total) by mouth 3 (three) times daily for 7 days. 07/21/20 07/28/20  Rudene Re, MD  cholecalciferol (VITAMIN D) 1000 units tablet Take 5,000 Units by mouth daily.     [provider]  docusate sodium (COLACE) 100 MG capsule Take 1 capsule (100 mg total) by mouth 2 (two) times daily. 06/10/17   Hollice Espy, MD  fluticasone furoate-vilanterol (BREO ELLIPTA) 100-25 MCG/INH AEPB Inhale 1 puff into the lungs daily. 12/23/18   Tyler Pita, MD  furosemide (LASIX) 20 MG tablet Take 1 tablet (20 mg total) by mouth daily as needed. 05/27/17   Gladstone Lighter, MD  Melatonin 3 MG TABS Take 3 mg by mouth at bedtime as needed.    [provider]  morphine (MSIR) 30 MG tablet Take 30 mg by mouth every 3 (three) hours as needed for severe pain. UAD PRN    [provider]  Multiple Vitamin (MULTIVITAMIN) tablet Take 1 tablet by mouth daily.    [provider]  ondansetron (ZOFRAN ODT) 4 MG disintegrating tablet Take 1 tablet (4 mg total) by mouth every 8 (eight) hours as needed for nausea or vomiting. 04/07/19   Tower, Wynelle Fanny, MD  ondansetron (ZOFRAN ODT) 4 MG disintegrating tablet Take 1 tablet (4 mg total) by mouth every 8 (eight) hours as needed. 07/21/20   Rudene Re, MD  potassium chloride (K-DUR) 10 MEQ tablet Take 1 tablet (10 mEq total) by mouth daily as needed  (while on lasix). 05/27/17   Gladstone Lighter, MD  PROAIR HFA 108 [phone] Base) MCG/ACT inhaler  08/18/18   [provider]  vitamin B-12 (CYANOCOBALAMIN) 1000 MCG tablet Place under the tongue.    [provider]    Allergies Sulfonamide derivatives  Family History  Problem Relation Age of Onset  . Diabetes Father   . Hypertension Father   . Stroke Father   . Hypertension Mother   . Cancer Mother        [name]  . Ulcers Mother   . Breast cancer Paternal Grandmother   . Bipolar disorder Other        some family  . Alcohol abuse Maternal Grandfather   . Colon cancer Neg Hx     Social History Social History   Tobacco Use  . Smoking status: Never Smoker  . Smokeless tobacco: Never Used  . Tobacco comment: non smoker  Vaping Use  . Vaping Use: Never used  Substance Use Topics  . Alcohol use: No    Alcohol/week:  0.0 standard drinks  . Drug use: No    Comment: history of opiate use abuse    Review of Systems  Constitutional: Negative for fever. Eyes: Negative for visual changes. ENT: Negative for sore throat. Neck: No neck pain  Cardiovascular: Negative for chest pain. Respiratory: Negative for shortness of breath. Gastrointestinal: + RUQ abdominal pain, nausea, and vomiting. No diarrhea. Genitourinary: Negative for dysuria. Musculoskeletal: Negative for back pain. Skin: Negative for rash. Neurological: Negative for headaches, weakness or numbness. Psych: No SI or HI  ____________________________________________   PHYSICAL EXAM:  VITAL SIGNS: ED Triage Vitals  Enc Vitals Group     BP 07/20/20 1829 140/64     Pulse Rate 07/20/20 1827 (!) 120     Resp 07/20/20 1827 16     Temp 07/20/20 1827 98.6 F (37 C)     Temp Source 07/20/20 1827 Oral     SpO2 07/20/20 1827 94 %     Weight 07/20/20 1829 185 lb (83.9 kg)     Height 07/20/20 1829 5\' 6"  (1.676 m)     Head Circumference --      Peak Flow --      Pain Score 07/20/20 1828 6      Pain Loc --      Pain Edu? --      Excl. in [place]? --     Constitutional: Alert and oriented. Well appearing and in no apparent distress. HEENT:      Head: Normocephalic and atraumatic.         Eyes: Conjunctivae are normal. Sclera is non-icteric.       Mouth/Throat: Mucous membranes are moist.       Neck: Supple with no signs of meningismus. Cardiovascular: Tachycardic with regular rhythm. Respiratory: Normal respiratory effort. Lungs are clear to auscultation bilaterally. No wheezes, crackles, or rhonchi.  Gastrointestinal: Soft, non tender, and non distended with positive bowel sounds. No rebound or guarding. Genitourinary: No CVA tenderness. Musculoskeletal: No edema, cyanosis, or erythema of extremities. Neurologic: Normal speech and language. Face is symmetric. Moving all extremities. No gross focal neurologic deficits are appreciated. Skin: Skin is warm, dry and intact. No rash noted. Psychiatric: Mood and affect are normal. Speech and behavior are normal.  ____________________________________________   LABS (all labs ordered are listed, but only abnormal results are displayed)  Labs Reviewed  CBC - Abnormal; Notable for the following components:      Result Value   Platelets 147 (*)    All other components within normal limits  URINALYSIS, COMPLETE (UACMP) WITH MICROSCOPIC - Abnormal; Notable for the following components:   Color, Urine YELLOW (*)    APPearance CLOUDY (*)    Nitrite POSITIVE (*)    Leukocytes,Ua MODERATE (*)    WBC, UA >50 (*)    Bacteria, UA MANY (*)    All other components within normal limits  COMPREHENSIVE METABOLIC PANEL - Abnormal; Notable for the following components:   Glucose, Bld 102 (*)    AST 55 (*)    Total Bilirubin 1.7 (*)    All other components within normal limits  CULTURE, BLOOD (ROUTINE X 2)  CULTURE, BLOOD (ROUTINE X 2)  LIPASE, BLOOD  LACTIC ACID, PLASMA  LACTIC ACID, PLASMA    ____________________________________________  EKG  ED ECG REPORT I, Rudene Re, the attending physician, personally viewed and interpreted this ECG.  Sinus tachycardia, rate of 121, normal intervals, normal axis, no ST elevations or depressions.  Otherwise normal EKG.  Unchanged from prior. ____________________________________________  RADIOLOGY  I have personally reviewed the images performed during this visit and I agree with the Radiologist's read.   Interpretation by Radiologist:  CT Renal Stone Study  Result Date: 07/21/2020 CLINICAL DATA:  Right upper quadrant pain and nausea for 3 days with emesis which began today EXAM: CT ABDOMEN AND PELVIS WITHOUT CONTRAST TECHNIQUE: Multidetector CT imaging of the abdomen and pelvis was performed following the standard protocol without IV contrast. COMPARISON:  CT 05/25/2017 FINDINGS: Lower chest: Dependent atelectatic changes in the lung bases. Additional bandlike opacities likely subsegmental atelectasis or scarring. Lung bases otherwise clear. Normal heart size. No pericardial effusion. Hepatobiliary: No visible concerning liver lesions on this unenhanced CT. Normal liver attenuation. Slightly lobular hepatic surface contour. Post cholecystectomy. No visible calcified intraductal gallstones or biliary ductal dilatation. Pancreas: Unremarkable. No pancreatic ductal dilatation or surrounding inflammatory changes. Spleen: Splenic size is upper limits normal. No concerning focal splenic lesions. Adrenals/Urinary Tract: Normal adrenal glands. Kidneys are symmetric in size and normally located. No visible or discernible renal lesions. No urolithiasis or hydronephrosis. No focal perinephric stranding or inflammation. There is some asymmetric thickening of the bladder base with some perivesicular hazy stranding as well. Small amount of intraluminal gas seen anti dependently within the urinary bladder is well. No bladder calculi or debris.  Stomach/Bowel: Small sliding-type hiatal hernia. Distal stomach is unremarkable. Air and fluid-filled duodenal diverticulum is again noted measuring up to 3 by 3.8 cm in size (2/32). No focal inflammation at this level. Distal duodenum is unremarkable. No focal small bowel thickening or dilatation partially air and fluid-filled appendix seen arising from the cecal tip in the right mid abdomen without focal periappendiceal inflammation. No colonic dilatation or wall thickening. Vascular/Lymphatic: Atherosclerotic calcifications within the abdominal aorta and branch vessels. No aneurysm or ectasia. There is nonspecific stranding and inflammation centered about the celiac axis and proximal SMA with some surrounding reactive adenopathy, overall similar in appearance to the comparison CT though more conspicuous on this exam given the absence of concomitant inflammatory changes elsewhere in the upper abdomen. Some adjacent prominent lymph nodes are similar to comparison without enlarged or enlarging nodes. Reproductive: Uterus is surgically absent. No concerning adnexal lesions. Retained ovarian tissue is quiescent. Other: Upper abdominal hazy mesenteric stranding centered upon the celiac axis and SMA origins, as above. No abdominopelvic free air or fluid. Small fat containing umbilical hernia with a small fat containing paraumbilical lobular herniation as well (2/50). No bowel containing hernias. Musculoskeletal: There is a compression deformity L1 vertebral body with up to 50% height loss centrally which has a remote appearance though is new since comparison in 2018. Mild levocurvature of the lumbar spine is possibly positional. Additional multilevel discogenic and facet degenerative changes throughout the included thoracolumbar levels. Transitional appearance of the L5 vertebral body with partially sacralized transverse processes. Mild-to-moderate degenerative changes in the hips and pelvis as well. IMPRESSION: 1.  Nodular hepatic surface contour could reflect stigmata of cirrhosis with upper abdominal venous collateralization possibly reflecting development of portal hypertension, similar in comparison to prior. 2. Nonspecific stranding and inflammation centered about the celiac axis and proximal SMA with some surrounding reactive adenopathy, overall similar in appearance to the comparison CT though more conspicuous on this exam given the absence of concomitant inflammatory changes elsewhere in the upper abdomen. Findings are nonspecific, could reflect sequela of portal hypertension, vasculitis or mesenteritis versus a retroperitoneal fibrosing process though not significantly increased comparison. 3. Asymmetric thickening of the bladder base with some perivesicular hazy stranding as  well as a small amount of intraluminal gas seen anti dependently within the urinary bladder. Correlate with recent instrumentation and consider urinalysis to exclude cystitis. Given asymmetry, further evaluation with outpatient cystoscopy should be considered as well. 4. No urolithiasis or hydronephrosis. 5. Compression deformity of L1 vertebral body with up to 50% height loss centrally which has a remote appearance though is new since comparison in comparison in 2018. Correlate for point tenderness. 6. Aortic Atherosclerosis (ICD10-I70.0). Electronically Signed   By: Lovena Le M.D.   On: 07/21/2020 01:59      ____________________________________________   PROCEDURES  Procedure(s) performed:yes .1-3 Lead EKG Interpretation Performed by: Rudene Re, MD Authorized by: Rudene Re, MD     Interpretation: non-specific     ECG rate assessment: tachycardic     Rhythm: sinus tachycardia     Ectopy: none     Critical Care performed:  None ____________________________________________   INITIAL IMPRESSION / ASSESSMENT AND PLAN / ED COURSE   64 y.o. female with a history of bladder prolapse and recurrent UTIs,  chronic Lyme disease, chronic pain, chronic fatigue syndrome, IBS, kidney stones who presents for evaluation of nausea and vomiting.  Patient is tachycardic but afebrile, abdomen is soft with no tenderness throughout, no flank tenderness.  UA is positive for urinary tract infection.  Patient has a history of prior obstructing stones and she is complaining of RUQ abdominal pain although she says that is chronic for her.  No leukocytosis. Will check lactic and give IV rocephin. Will get CT renal to rule out obstructing stone. Will give IV toradol, zofran, and IV fluids for symptom relief.  Urine culture has been sent.  Old medical records reviewed.  Differential diagnosis including UTI versus pyelonephritis versus obstructing stone with overlying UTI.    _________________________ 2:12 AM on 07/21/2020 -----------------------------------------  CT visualized by me with no signs of kidney stone or fat stranding of the kidneys.  Reading confirmed by radiology who also documented several incidental findings including possible cirrhosis of the liver and signs of inflammation around the celiac axis and proximal SMA with some reactive adenopathy.  I discussed his incidental findings with patient.  Recommended follow-up with her urologist and primary care doctor since patient has had a history of bladder cancer.  At this time there is no signs of sepsis and patient can be discharged home on Keflex.  Discussed my standard return precautions with her and her husband who is at bedside.   _____________________________________________ Please note:  Patient was evaluated in Emergency Department today for the symptoms described in the history of present illness. Patient was evaluated in the context of the global COVID-19 pandemic, which necessitated consideration that the patient might be at risk for infection with the SARS-CoV-2 virus that causes COVID-19. Institutional protocols and algorithms that pertain to the  evaluation of patients at risk for COVID-19 are in a state of rapid change based on information released by regulatory bodies including the CDC and federal and state organizations. These policies and algorithms were followed during the patient's care in the ED.  Some ED evaluations and interventions may be delayed as a result of limited staffing during the pandemic.   [place] Controlled Substance Database was reviewed by me. ____________________________________________   FINAL CLINICAL IMPRESSION(S) / ED DIAGNOSES   Final diagnoses:  Acute cystitis with hematuria  Adenopathy  Cirrhosis of liver without ascites, unspecified hepatic cirrhosis type ([place])      NEW MEDICATIONS STARTED DURING THIS VISIT:  ED Discharge Orders  Ordered    cephALEXin (KEFLEX) 500 MG capsule  3 times daily        07/21/20 0211    ondansetron (ZOFRAN ODT) 4 MG disintegrating tablet  Every 8 hours PRN        07/21/20 0211           Note:  This document was prepared using Dragon voice recognition software and may include unintentional dictation errors.    Alfred Levins, [place], MD 07/21/20 [id number]

## 2020-07-21 NOTE — ED Notes (Signed)
Patient transported to CT 

## 2020-07-26 LAB — CULTURE, BLOOD (ROUTINE X 2)
Culture: NO GROWTH
Culture: NO GROWTH

## 2020-08-14 ENCOUNTER — Telehealth: Payer: Self-pay | Admitting: General Practice

## 2020-08-14 NOTE — Telephone Encounter (Signed)
Pt called to see if she could re-establish Last seen here 02/18/16 [employer]   Ok to schedule if when?

## 2020-08-14 NOTE — Telephone Encounter (Signed)
That is fine  30 minutes  If she has multiple new issues-please try to give it a 45 minute slot Thanks

## 2020-08-15 NOTE — Telephone Encounter (Signed)
Great, thanks

## 2020-08-15 NOTE — Telephone Encounter (Signed)
I spoke to patient and she said she's seeing several specialists for her issues.  I scheduled her appointment on 08/28/20 at 4:00 in case she needs extra time.

## 2020-08-28 ENCOUNTER — Encounter: Payer: Self-pay | Admitting: Family Medicine

## 2020-08-28 ENCOUNTER — Other Ambulatory Visit: Payer: Self-pay

## 2020-08-28 ENCOUNTER — Ambulatory Visit: Payer: 59 | Admitting: Family Medicine

## 2020-08-28 VITALS — BP 160/72 | HR 123 | Temp 97.4°F | Ht 66.0 in | Wt 202.0 lb

## 2020-08-28 DIAGNOSIS — R1011 Right upper quadrant pain: Secondary | ICD-10-CM | POA: Diagnosis not present

## 2020-08-28 DIAGNOSIS — N390 Urinary tract infection, site not specified: Secondary | ICD-10-CM

## 2020-08-28 DIAGNOSIS — S32010S Wedge compression fracture of first lumbar vertebra, sequela: Secondary | ICD-10-CM | POA: Diagnosis not present

## 2020-08-28 DIAGNOSIS — S32010A Wedge compression fracture of first lumbar vertebra, initial encounter for closed fracture: Secondary | ICD-10-CM | POA: Insufficient documentation

## 2020-08-28 DIAGNOSIS — N3001 Acute cystitis with hematuria: Secondary | ICD-10-CM | POA: Diagnosis not present

## 2020-08-28 DIAGNOSIS — R748 Abnormal levels of other serum enzymes: Secondary | ICD-10-CM | POA: Insufficient documentation

## 2020-08-28 DIAGNOSIS — Z8781 Personal history of (healed) traumatic fracture: Secondary | ICD-10-CM | POA: Insufficient documentation

## 2020-08-28 LAB — POC URINALSYSI DIPSTICK (AUTOMATED)
Bilirubin, UA: NEGATIVE
Bilirubin, UA: NEGATIVE
Blood, UA: 3
Glucose, UA: NEGATIVE
Glucose, UA: NEGATIVE
Ketones, UA: 5
Ketones, UA: 5
Nitrite, UA: POSITIVE
Protein, UA: POSITIVE — AB
Protein, UA: POSITIVE — AB
Spec Grav, UA: >=1.03 — AB (ref 1.010–1.025)
Spec Grav, UA: >=1.03 — AB (ref 1.010–1.025)
Urobilinogen, UA: 0.2 E.U./dL
Urobilinogen, UA: 0.2 E.U./dL
pH, UA: 6 (ref 5.0–8.0)
pH, UA: 6 (ref 5.0–8.0)

## 2020-08-28 MED ORDER — ONDANSETRON HCL 4 MG PO TABS: 4.0000 mg | ORAL_TABLET | Freq: Three times a day (TID) | ORAL | 3 refills | Status: DC | PRN

## 2020-08-28 NOTE — Assessment & Plan Note (Signed)
With findings of cirrhosis on CT [place] ordered  Repeat labs done today  H/o intermittent RUQ pain

## 2020-08-28 NOTE — Progress Notes (Signed)
Subjective:    Patient ID: [name] Branch, female    DOB: 03-16-1956, 64 y.o.   MRN: [id number]  This visit occurred during the SARS-CoV-2 public health emergency.  Safety protocols were in place, including screening questions prior to the visit, additional usage of staff PPE, and extensive cleaning of exam room while observing appropriate contact time as indicated for disinfecting solutions.    HPI Pt presents to re est for primary care  Was previously without insurance    Wt Readings from Last 3 Encounters:  08/28/20 202 lb (91.6 kg)  07/20/20 185 lb (83.9 kg)  10/18/19 180 lb (81.6 kg)   32.60 kg/m   covid status  Had first vaccine and 2nd one scheduled for next week   Recent ER visit sept-uti and abn findings on CT scan  tx with keflex  I do not see a urine culture in the chart Blood cx showed no growth  Has problems with utis  Has a bad bladder prolapse (has not had it fixed due to covid)  Kidney stones in the past  Has not been to a urologist lately - she has a doctor she sees in [place] She will get medicare soon  uti symptoms for about a week She has frequent urination  Smell is bad /urine  Takes azo prn   She always needs anti emetic if she is on an antibiotic (all of them cause n/v) zofran works well -does not make her drowsy    Also liver concerns  ? Signs of cirrhosis on CT scan Was told she had liver enzyme elevations  Hepatitis tests were neg (did at home)  Lab Results  Component Value Date   ALT 30 07/20/2020   AST 55 (H) 07/20/2020   ALKPHOS 110 07/20/2020   BILITOT 1.7 (H) 07/20/2020    Does not take tylenol or drink alcohol  Had ccy in the past  occ gets a little pain in RUQ-is dull and occ n/v   Last CT scan in 9/21  IMPRESSION: 1. Nodular hepatic surface contour could reflect stigmata of cirrhosis with upper abdominal venous collateralization possibly reflecting development of portal hypertension, similar in comparison to  prior. 2. Nonspecific stranding and inflammation centered about the celiac axis and proximal SMA with some surrounding reactive adenopathy, overall similar in appearance to the comparison CT though more conspicuous on this exam given the absence of concomitant inflammatory changes elsewhere in the upper abdomen. Findings are nonspecific, could reflect sequela of portal hypertension, vasculitis or mesenteritis versus a retroperitoneal fibrosing process though not significantly increased comparison. 3. Asymmetric thickening of the bladder base with some perivesicular hazy stranding as well as a small amount of intraluminal gas seen anti dependently within the urinary bladder. Correlate with recent instrumentation and consider urinalysis to exclude cystitis. Given asymmetry, further evaluation with outpatient cystoscopy should be considered as well. 4. No urolithiasis or hydronephrosis. 5. Compression deformity of L1 vertebral body with up to 50% height loss centrally which has a remote appearance though is new since comparison in comparison in 2018. Correlate for point tenderness. 6. Aortic Atherosclerosis (ICD10-I70.0).  Thinks she has had h/o parasite/ whipworm (years)  GI - has seen Dr Hilarie Fredrickson in the past   Results for orders placed or performed in visit on 08/28/20  POCT Urinalysis Dipstick (Automated)  Result Value Ref Range   Color, UA tea color    Clarity, UA     Glucose, UA Negative Negative   Bilirubin, UA neg  Ketones, UA 5    Spec Grav, UA >=1.030 (A) 1.010 - 1.025   Blood, UA 3+    pH, UA 6.0 5.0 - 8.0   Protein, UA Positive (A) Negative   Urobilinogen, UA 0.2 0.2 or 1.0 E.U./dL   Nitrite, UA pos    Leukocytes, UA Moderate (2+) (A) Negative     Patient Active Problem List   Diagnosis Date Noted  . Elevated liver enzymes 08/28/2020  . Compression fracture of L1 lumbar vertebra ([place]) 08/28/2020  . Mixed stress and urge urinary incontinence 10/29/2017  .  Frequent UTI 02/18/2016  . Abdominal pain, RUQ 02/18/2016  . Dyspnea 01/29/2016  . Fever, unspecified 11/12/2015  . Tremor 11/12/2015  . Chronic nausea 11/12/2015  . UTI (urinary tract infection) 11/08/2015  . Productive cough 11/08/2015  . Constipation 04/12/2015  . Cystocele 04/12/2015  . Routine general medical examination at a health care facility 06/26/2014  . Osteopenia 06/26/2014  . Prediabetes 06/26/2014  . Colon cancer screening 06/26/2014  . Dysuria 06/26/2014  . Intertrigo 07/12/2013  . Chronic fatigue syndrome 01/19/2013  . Elevated alkaline phosphatase level 09/19/2012  . Edema 09/16/2012  . FREQUENCY, URINARY 08/06/2010  . Diarrhea 04/14/2010  . CONSTIPATION, CHRONIC 07/30/2008  . Irritable bowel syndrome 07/30/2008  . PURE HYPERCHOLESTEROLEMIA 06/21/2008  . POSTMENOPAUSAL STATUS 06/21/2008  . VAGINITIS, ATROPHIC 06/21/2008  . Pain in limb 06/21/2008   Past Medical History:  Diagnosis Date  . Allergic rhinitis   . Arthritis   . 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 Dr. Cristy Friedlander   . Chronic gastritis   . Dyspnea   . Hepatic steatosis   . History of kidney stones 05/2017  . Hyperlipidemia   . IBS (irritable bowel syndrome)   . Lyme disease    "stari"  . Migraine   . Muscle twitching   . Narcolepsy   . Neuromuscular disorder ([place])   . PONV (postoperative nausea and vomiting)   . Rectal prolapse   . Serrated adenoma of colon   . Umbilical hernia   . Vaginal prolapse   . West Nile Virus infection 2003   Past Surgical History:  Procedure Laterality Date  . 5th R toe fx  '09  . ABDOMINAL HYSTERECTOMY    . BREAST BIOPSY  '04   neg; right  . CHOLECYSTECTOMY  10/2011  . CYSTOSCOPY WITH STENT PLACEMENT Right 05/25/2017   Procedure: CYSTOSCOPY WITH STENT PLACEMENT;  Surgeon: Hollice Espy, MD;  Location: [facility];  Service: Urology;  Laterality: Right;  . CYSTOSCOPY WITH STENT PLACEMENT Right  06/21/2017   Procedure: CYSTOSCOPY WITH STENT EXCHANGE;  Surgeon: Hollice Espy, MD;  Location: [facility];  Service: Urology;  Laterality: Right;  . DIAGNOSTIC LAPAROSCOPY    . EXTRACORPOREAL SHOCK WAVE LITHOTRIPSY Right 06/10/2017   Procedure: EXTRACORPOREAL SHOCK WAVE LITHOTRIPSY (ESWL);  Surgeon: Hollice Espy, MD;  Location: [facility];  Service: Urology;  Laterality: Right;  . HOLMIUM LASER APPLICATION Right 0/07/2724   Procedure: HOLMIUM LASER APPLICATION;  Surgeon: Hollice Espy, MD;  Location: [facility];  Service: Urology;  Laterality: Right;  . laprascopy    . PARTIAL HYSTERECTOMY     bleeding  . TONSILLECTOMY    . TONSILLECTOMY    . ULNAR NERVE REPAIR     sx times 5  . UPPER GASTROINTESTINAL ENDOSCOPY  12/04/2004   gastritis  . URETEROSCOPY WITH HOLMIUM LASER LITHOTRIPSY Right 06/21/2017   Procedure: URETEROSCOPY WITH HOLMIUM LASER LITHOTRIPSY;  Surgeon: Hollice Espy, MD;  Location: [facility];  Service: Urology;  Laterality: Right;   Social History   Tobacco Use  . Smoking status: Never Smoker  . Smokeless tobacco: Never Used  . Tobacco comment: non smoker  Vaping Use  . Vaping Use: Never used  Substance Use Topics  . Alcohol use: No    Alcohol/week: 0.0 standard drinks  . Drug use: No    Comment: history of opiate use abuse   Family History  Problem Relation Age of Onset  . Diabetes Father   . Hypertension Father   . Stroke Father   . Hypertension Mother   . Cancer Mother        [name]  . Ulcers Mother   . Breast cancer Paternal Grandmother   . Bipolar disorder Other        some family  . Alcohol abuse Maternal Grandfather   . Colon cancer Neg Hx    Allergies  Allergen Reactions  . Sulfonamide Derivatives Nausea And Vomiting   Current Outpatient Medications on File Prior to Visit  Medication Sig Dispense Refill  . Calcium Polycarbophil (FIBER-CAPS PO) Take 1 capsule by mouth 2 (two) times daily.    . cholecalciferol (VITAMIN D) 1000 units tablet  Take 5,000 Units by mouth daily.     . furosemide (LASIX) 20 MG tablet Take 1 tablet (20 mg total) by mouth daily as needed. 30 tablet 0  . Melatonin 3 MG TABS Take 3 mg by mouth at bedtime as needed.    . Milk Thistle Extract 175 MG TABS     . morphine (MSIR) 30 MG tablet Take 30 mg by mouth every 3 (three) hours as needed for severe pain. UAD PRN    . Multiple Vitamin (MULTIVITAMIN) tablet Take 1 tablet by mouth daily.    . potassium chloride (K-DUR) 10 MEQ tablet Take 1 tablet (10 mEq total) by mouth daily as needed (while on lasix). 30 tablet 0  . vitamin B-12 (CYANOCOBALAMIN) 1000 MCG tablet Place under the tongue.    . docusate sodium (COLACE) 100 MG capsule Take 1 capsule (100 mg total) by mouth 2 (two) times daily. 60 capsule 0   Current Facility-Administered Medications on File Prior to Visit  Medication Dose Route Frequency Provider Last Rate Last Admin  . fluticasone furoate-vilanterol (BREO ELLIPTA) 200-25 MCG/INH 1 puff  1 puff Inhalation Daily Tyler Pita, MD        Review of Systems  Constitutional: Positive for activity change, appetite change and fatigue. Negative for fever and unexpected weight change.  HENT: Negative for congestion, ear pain, rhinorrhea, sinus pressure and sore throat.   Eyes: Negative for pain, redness and visual disturbance.  Respiratory: Negative for cough, shortness of breath and wheezing.   Cardiovascular: Negative for chest pain and palpitations.  Gastrointestinal: Positive for abdominal pain, nausea and vomiting. Negative for abdominal distention, blood in stool, constipation and diarrhea.  Endocrine: Negative for polydipsia and polyuria.  Genitourinary: Positive for frequency and urgency. Negative for dysuria, flank pain and hematuria.  Musculoskeletal: Positive for arthralgias, back pain, gait problem and joint swelling. Negative for myalgias.  Skin: Negative for pallor and rash.  Allergic/Immunologic: Negative for environmental allergies.    Neurological: Positive for weakness. Negative for dizziness, syncope and headaches.       Generalized weakness  Hematological: Negative for adenopathy. Does not bruise/bleed easily.  Psychiatric/Behavioral: Negative for decreased concentration and dysphoric mood. The patient is not nervous/anxious.  Objective:   Physical Exam Constitutional:      General: She is not in acute distress.    Appearance: She is well-developed. She is obese.     Comments: Frail appearing and kyphotic in wheelchair  HENT:     Head: Normocephalic and atraumatic.     Mouth/Throat:     Mouth: Mucous membranes are moist.  Eyes:     General: No scleral icterus.    Conjunctiva/sclera: Conjunctivae normal.     Pupils: Pupils are equal, round, and reactive to light.  Neck:     Thyroid: No thyromegaly.     Vascular: No carotid bruit or JVD.  Cardiovascular:     Rate and Rhythm: Regular rhythm. Tachycardia present.     Heart sounds: Normal heart sounds. No gallop.      Comments: Pt states tachycardia is her baseline in office  Pulmonary:     Effort: Pulmonary effort is normal. No respiratory distress.     Breath sounds: Normal breath sounds. No wheezing or rales.  Abdominal:     General: Bowel sounds are normal. There is no distension or abdominal bruit.     Palpations: Abdomen is soft. There is no mass.     Tenderness: There is no abdominal tenderness. There is no right CVA tenderness, left CVA tenderness, guarding or rebound.     Hernia: No hernia is present.     Comments: Exam done sitting in wheelchair  Do not appreciate hepatomegaly  Musculoskeletal:     Cervical back: Normal range of motion and neck supple.     Right lower leg: Edema present.     Left lower leg: Edema present.     Comments: Trace pedal edema   Lymphadenopathy:     Cervical: No cervical adenopathy.  Skin:    General: Skin is warm and dry.     Coloration: Skin is not pale.     Findings: No erythema or rash.  Neurological:      Mental Status: She is alert.     Cranial Nerves: No cranial nerve deficit.     Coordination: Coordination normal.     Deep Tendon Reflexes: Reflexes are normal and symmetric.  Psychiatric:        Mood and Affect: Mood normal.     Comments: Seems fatigued            Assessment & Plan:   Problem List Items Addressed This Visit      Musculoskeletal and Integument   Compression fracture of L1 lumbar vertebra ([place])    Pt takes vit D and is quite kyphotic Suspect bone density is lower  Seen by pain clinic for chronic pain issues  Very little wt bearing exercise  After other pressing medical issues are addressed pt needs a dexa and disc tx for low bone mass Also fall risk        Genitourinary   UTI (urinary tract infection)    Frequent utis and h/o pyelo in the past Has nausea with all abx  ua is pos today Sent for cx and will decide on tx with that  She plans to f/u with her urologist       Relevant Orders   Urine Culture   POCT Urinalysis Dipstick (Automated) (Completed)   Frequent UTI - Primary    Positive ua today and cx pending  May need to be on prophylaxis but pt develops nausea with all abx so this would be difficult  She plan to f/u with her  urologist       Relevant Orders   CBC with Differential/Platelet   Renal function panel   Urine Culture   POCT Urinalysis Dipstick (Automated) (Completed)   POCT Urinalysis Dipstick (Automated)     Other   Elevated alkaline phosphatase level    With findings of cirrhosis on CT [place] of abd planned      Relevant Orders   Hepatic function panel   Abdominal pain, RUQ    This is chronic and intermittent  CT scan from sept showed signs of cirrhosis and possible portal hypertension  Per pt -hepatitis screening was neg with a home test  [place] ordered today  Noted elevated alk phos and AST and bili total No jaundice  Will after [place] will plan further care with GI /she has seen Dr Hilarie Fredrickson in the past  Chem labs done  today      Relevant Orders   US ABDOMEN LIMITED RUQ (LIVER/GB)   CBC with Differential/Platelet   Hepatic function panel   Elevated liver enzymes    With findings of cirrhosis on CT [place] ordered  Repeat labs done today  H/o intermittent RUQ pain      Relevant Orders   US ABDOMEN LIMITED RUQ (LIVER/GB)   CBC with Differential/Platelet   Hepatic function panel

## 2020-08-28 NOTE — Assessment & Plan Note (Signed)
This is chronic and intermittent  CT scan from sept showed signs of cirrhosis and possible portal hypertension  Per pt -hepatitis screening was neg with a home test  [place] ordered today  Noted elevated alk phos and AST and bili total No jaundice  Will after [place] will plan further care with GI /she has seen Dr Hilarie Fredrickson in the past  Chem labs done today

## 2020-08-28 NOTE — Assessment & Plan Note (Signed)
Pt takes vit D and is quite kyphotic Suspect bone density is lower  Seen by pain clinic for chronic pain issues  Very little wt bearing exercise  After other pressing medical issues are addressed pt needs a dexa and disc tx for low bone mass Also fall risk

## 2020-08-28 NOTE — Assessment & Plan Note (Signed)
Positive ua today and cx pending  May need to be on prophylaxis but pt develops nausea with all abx so this would be difficult  She plan to f/u with her urologist

## 2020-08-28 NOTE — Assessment & Plan Note (Signed)
Frequent utis and h/o pyelo in the past Has nausea with all abx  ua is pos today Sent for cx and will decide on tx with that  She plans to f/u with her urologist

## 2020-08-28 NOTE — Assessment & Plan Note (Signed)
With findings of cirrhosis on CT [place] of abd planned

## 2020-08-28 NOTE — Patient Instructions (Addendum)
Be sure to follow up with your urologist   I placed a referral for ultrasound to look at your liver  Then likely refer to GI Dr Hilarie Fredrickson   Let's do a urinalysis and culture

## 2020-08-29 LAB — CBC WITH DIFFERENTIAL/PLATELET
Basophils Absolute: 0.1 10*3/uL (ref 0.0–0.1)
Basophils Relative: 0.8 % (ref 0.0–3.0)
Eosinophils Absolute: 0.2 10*3/uL (ref 0.0–0.7)
Eosinophils Relative: 2.9 % (ref 0.0–5.0)
HCT: 42.3 % (ref 36.0–46.0)
Hemoglobin: 13.9 g/dL (ref 12.0–15.0)
Lymphocytes Relative: 27.6 % (ref 12.0–46.0)
Lymphs Abs: 2.2 10*3/uL (ref 0.7–4.0)
MCHC: 32.9 g/dL (ref 30.0–36.0)
MCV: 84.2 fl (ref 78.0–100.0)
Monocytes Absolute: 0.9 10*3/uL (ref 0.1–1.0)
Monocytes Relative: 11.2 % (ref 3.0–12.0)
Neutro Abs: 4.5 10*3/uL (ref 1.4–7.7)
Neutrophils Relative %: 57.5 % (ref 43.0–77.0)
Platelets: 206 10*3/uL (ref 150.0–400.0)
RBC: 5.02 Mil/uL (ref 3.87–5.11)
RDW: 14.4 % (ref 11.5–15.5)
WBC: 7.9 10*3/uL (ref 4.0–10.5)

## 2020-08-29 LAB — RENAL FUNCTION PANEL
Albumin: 4.1 g/dL (ref 3.5–5.2)
BUN: 11 mg/dL (ref 6–23)
CO2: 29 mEq/L (ref 19–32)
Calcium: 9.5 mg/dL (ref 8.4–10.5)
Chloride: 98 mEq/L (ref 96–112)
Creatinine, Ser: 0.6 mg/dL (ref 0.40–1.20)
GFR: 94.93 mL/min (ref >60.00)
Glucose, Bld: 91 mg/dL (ref 70–99)
Phosphorus: 3.5 mg/dL (ref 2.3–4.6)
Potassium: 4 mEq/L (ref 3.5–5.1)
Sodium: 137 mEq/L (ref 135–145)

## 2020-08-29 LAB — HEPATIC FUNCTION PANEL
ALT: 22 U/L (ref 0–35)
AST: 46 U/L — ABNORMAL HIGH (ref 0–37)
Albumin: 4.1 g/dL (ref 3.5–5.2)
Alkaline Phosphatase: 115 U/L (ref 39–117)
Bilirubin, Direct: 0.4 mg/dL — ABNORMAL HIGH (ref 0.0–0.3)
Total Bilirubin: 2.2 mg/dL — ABNORMAL HIGH (ref 0.2–1.2)
Total Protein: 7.4 g/dL (ref 6.0–8.3)

## 2020-08-31 LAB — URINE CULTURE
MICRO NUMBER:: 11155126
SPECIMEN QUALITY:: ADEQUATE

## 2020-09-01 ENCOUNTER — Other Ambulatory Visit: Payer: Self-pay | Admitting: Family Medicine

## 2020-09-01 MED ORDER — CEPHALEXIN 500 MG PO CAPS: 500.0000 mg | ORAL_CAPSULE | Freq: Two times a day (BID) | ORAL | 0 refills | Status: DC

## 2020-09-01 NOTE — Telephone Encounter (Signed)
I [facility] [place]  ucx grew e coli that is sensitive to all abx  She should have zofran for nausea since all abx cause this   Please alert [place] if no imp  Do f/u with urologist as we discussed also  I also sent hre a my chart message

## 2020-09-02 NOTE — Telephone Encounter (Signed)
Patient notified VIA phone.  No questions.  Dm/cma ? ?

## 2020-09-02 NOTE — Telephone Encounter (Signed)
Spoke with patient who verbally understood message below patient have picked up Rx and states that she was under the impression that Rx would be for her to take from now until she sees urologist. Per pt Rx only for 1 week. Please advise.

## 2020-09-02 NOTE — Telephone Encounter (Signed)
I wanted to treat this uti first before we put her on prophylaxis (dosing is different)  So let me know if this medication helps and when she finishes it

## 2020-09-05 ENCOUNTER — Other Ambulatory Visit: Payer: 59

## 2020-09-10 MED ORDER — ONDANSETRON HCL 4 MG PO TABS: 4.0000 mg | ORAL_TABLET | Freq: Three times a day (TID) | ORAL | 5 refills | Status: DC | PRN

## 2020-09-10 MED ORDER — NITROFURANTOIN MONOHYD MACRO 100 MG PO CAPS: 100.0000 mg | ORAL_CAPSULE | Freq: Two times a day (BID) | ORAL | 0 refills | Status: DC

## 2020-09-10 NOTE — Telephone Encounter (Signed)
Pt notified of Dr. Marliss Coots comments and verbalized understanding. Rx sent to pharmacy and pt will drop off a urine sample next week for cx.

## 2020-09-10 NOTE — Addendum Note (Signed)
Addended by: Tammi Sou on: 09/10/2020 04:38 PM   Modules accepted: Orders

## 2020-09-10 NOTE — Telephone Encounter (Signed)
Keflex should have worked but sounds like it didn't   I pended macrobid to send to pharmacy of choice (also zofran) Take it bid for 7 d and then change to once daily for prophylaxis   After 7 days please bring a sample to culture so we are sure you are clear  I wrote for 20 macrobid to get [place] through until we see what culture shows

## 2020-09-10 NOTE — Addendum Note (Signed)
Addended by: Loura Pardon A on: 09/10/2020 11:11 AM   Modules accepted: Orders

## 2020-09-17 ENCOUNTER — Telehealth: Payer: Self-pay

## 2020-09-17 ENCOUNTER — Other Ambulatory Visit: Payer: Self-pay

## 2020-09-17 ENCOUNTER — Other Ambulatory Visit (INDEPENDENT_AMBULATORY_CARE_PROVIDER_SITE_OTHER): Payer: 59

## 2020-09-17 DIAGNOSIS — N390 Urinary tract infection, site not specified: Secondary | ICD-10-CM

## 2020-09-17 DIAGNOSIS — R3989 Other symptoms and signs involving the genitourinary system: Secondary | ICD-10-CM

## 2020-09-17 LAB — POC URINALSYSI DIPSTICK (AUTOMATED)
Bilirubin, UA: 1
Blood, UA: NEGATIVE
Glucose, UA: NEGATIVE
Ketones, UA: 5
Leukocytes, UA: NEGATIVE
Nitrite, UA: NEGATIVE
Protein, UA: POSITIVE — AB
Spec Grav, UA: >=1.03 — AB (ref 1.010–1.025)
Urobilinogen, UA: 0.2 E.U./dL
pH, UA: 6 (ref 5.0–8.0)

## 2020-09-17 NOTE — Telephone Encounter (Signed)
Pt left v/m that her husband is bringing a urine specimen to [facility] for pt on 09/17/20 or Fri 09/20/20 as requested. Pt request cb with time frame of how long can be collected and delivered and where should the urine specimen be taken. I do not see any instructions in pts chart about bringing a urine specimen to [facility] on 09/17/20 or 09/20/20. Pt request cb. Sending note to [place] CMA to see if she is aware of arrangements made to bring urine specimen to office. (? May be from refill not on 09/01/20.)

## 2020-09-17 NOTE — Telephone Encounter (Signed)
It was an refill note, PCP did state she wanted a urine sample, called pt and spouse has already dropped off urine sample

## 2020-09-18 LAB — URINE CULTURE
MICRO NUMBER:: 11238877
SPECIMEN QUALITY:: ADEQUATE

## 2020-09-23 ENCOUNTER — Encounter: Payer: Self-pay | Admitting: Family Medicine

## 2020-09-23 MED ORDER — NITROFURANTOIN MONOHYD MACRO 100 MG PO CAPS
100.0000 mg | ORAL_CAPSULE | Freq: Every day | ORAL | 5 refills | Status: DC
Start: 2020-09-23 — End: 2021-03-20

## 2020-09-23 MED ORDER — ONDANSETRON HCL 8 MG PO TABS: 8.0000 mg | ORAL_TABLET | Freq: Three times a day (TID) | ORAL | 3 refills | Status: DC | PRN

## 2020-09-24 ENCOUNTER — Other Ambulatory Visit: Payer: Self-pay

## 2020-09-24 ENCOUNTER — Ambulatory Visit
Admission: RE | Admit: 2020-09-24 | Discharge: 2020-09-24 | Disposition: A | Discharge status: Home / Self Care | Payer: 59 | Source: Ambulatory Visit | Attending: Family Medicine | Admitting: Family Medicine

## 2020-09-24 DIAGNOSIS — R1011 Right upper quadrant pain: Secondary | ICD-10-CM | POA: Diagnosis present

## 2020-09-24 DIAGNOSIS — R748 Abnormal levels of other serum enzymes: Secondary | ICD-10-CM

## 2020-09-25 ENCOUNTER — Telehealth: Payer: Self-pay | Admitting: Family Medicine

## 2020-09-25 DIAGNOSIS — R748 Abnormal levels of other serum enzymes: Secondary | ICD-10-CM

## 2020-09-25 DIAGNOSIS — R1011 Right upper quadrant pain: Secondary | ICD-10-CM

## 2020-09-25 NOTE — Telephone Encounter (Signed)
-----   Message from Tammi Sou, [place] sent at 09/25/2020  4:39 PM EST ----- Pt notified of [place] results and Dr. Marliss Coots comments. Pt does want a referral to go back to Dr. Hilarie Fredrickson, I advise pt Dr. Glori Bickers will put referral in an our [facility] will call to schedule appt

## 2020-10-03 ENCOUNTER — Ambulatory Visit: Payer: 59 | Admitting: Physician Assistant

## 2020-11-05 ENCOUNTER — Ambulatory Visit: Payer: 59 | Admitting: Physician Assistant

## 2020-11-05 ENCOUNTER — Other Ambulatory Visit: Payer: 59

## 2020-11-05 ENCOUNTER — Encounter: Payer: Self-pay | Admitting: Physician Assistant

## 2020-11-05 VITALS — BP 154/88 | HR 110 | Ht 66.0 in | Wt 185.0 lb

## 2020-11-05 DIAGNOSIS — K529 Noninfective gastroenteritis and colitis, unspecified: Secondary | ICD-10-CM

## 2020-11-05 DIAGNOSIS — Z86010 Personal history of colonic polyps: Secondary | ICD-10-CM

## 2020-11-05 DIAGNOSIS — K760 Fatty (change of) liver, not elsewhere classified: Secondary | ICD-10-CM

## 2020-11-05 DIAGNOSIS — R1011 Right upper quadrant pain: Secondary | ICD-10-CM | POA: Diagnosis not present

## 2020-11-05 DIAGNOSIS — R7989 Other specified abnormal findings of blood chemistry: Secondary | ICD-10-CM

## 2020-11-05 NOTE — Addendum Note (Signed)
Addended by: Darrall Dears on: 11/05/2020 02:51 PM   Modules accepted: Orders

## 2020-11-05 NOTE — Patient Instructions (Signed)
If you are age 65 or older, your body mass index should be between 23-30. Your Body mass index is 29.86 kg/m. If this is out of the aforementioned range listed, please consider follow up with your Primary Care Provider.  If you are age 27 or younger, your body mass index should be between 19-25. Your Body mass index is 29.86 kg/m. If this is out of the aformentioned range listed, please consider follow up with your Primary Care Provider.   Your provider has requested that you go to the basement level for lab work before leaving today. Press "B" on the elevator. The lab is located at the first door on the left as you exit the elevator.  Due to recent changes in healthcare laws, you may see the results of your imaging and laboratory studies on MyChart before your provider has had a chance to review them.  We understand that in some cases there may be results that are confusing or concerning to you. Not all laboratory results come back in the same time frame and the provider may be waiting for multiple results in order to interpret others.  Please give [place] 48 hours in order for your provider to thoroughly review all the results before contacting the office for clarification of your results.   Thank you for choosing me and [facility].  Ellouise Newer, PA-C

## 2020-11-05 NOTE — Progress Notes (Signed)
Chief Complaint: Elevated LFTs, diarrhea  HPI:    [name] is a 65 year old female with a past medical history as listed below including IBS, Lyme disease, CF ID, chronic constipation exacerbated by opioids, narcolepsy and migraines as well as multiple others, known [name] Branch, who was referred to me by [name] Branch for a complaint of elevated LFTs.      04/20/2016 was patient's last office visit.  At that time a repeat colonoscopy was recommended due to a large sessile serrated polyp that had been removed piecemeal.  Recommend she have a 2-day bowel prep.  Also discussed constipation with abdominal bloating and upper abdominal discomfort, symptoms felt secondary to constipation likely related to her redundant colonic anatomy also discussed fatty liver which is seen by imaging with mild elevation in serum AST.  She was recommended to use vitamin E 800 international units daily.    05/12/2016 colonoscopy with a tattoo in the ascending colon, post polypectomy scar at the tattoo site with no residual polyp, 2 2-4 mm polyps in the transverse colon and internal hemorrhoids.  Repeat recommended in 3 years due to the fact that she had multiple colon polyps.    08/28/2020 hepatic function panel showed a total bilirubin of 2.2 (patient chronically elevated it looks like since 2018 around the 1.3-1.8 range), direct bili elevated 0.4, alk phos 115, AST 46 (chronically elevated, previously higher around 55-57 over the past 4 years), normal ALT.    09/24/2020 ultrasound of the abdomen showed question of coarsened hepatic echotexture and increased echogenicity which could be related to hepatic steatosis and potential early liver disease, postcholecystectomy without biliary duct dilation, limited assessment due to technical factors related to anatomic distortion in the setting of kyphosis.    Today, the patient presents clinic and tells me that she knows she has always had elevated liver enzymes but the reason  that she had an ultrasound this time was because she was having some right upper quadrant swelling and was nauseous almost all day long.  Explains that she was placed on Macrobid though for a chronic UTI situation and after starting this about 3 months ago she stopped with any further right upper quadrant discomfort or nausea.  She is not sure how the two are related.    More concerning to her today is some diarrhea.  Apparently this has been going on for 2 years.  She believes that it is whipworms since her dogs all have whipworm's and her and her husband both have the same symptoms.  Describes that she has picked up internal parasites from her animals before and was eventually just empirically treated with Flagyl, her and her husband both, and this seemed to work for them.  Currently they have gone past the acute stage and are "in the cyclical phase".  Tells me that she will have 2 to 4 weeks where her symptoms are gone but then her diarrhea will come back and she will have 5 days of smell foul-smelling, dark, watery diarrhea about 3-4 times a day with just a minimal amount of abdominal discomfort.  Her husband gets the same symptoms but they start 2 days prior to hers.  Denies any fever.    Denies weight loss.  Past Medical History:  Diagnosis Date  . Allergic rhinitis   . Arthritis   . 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 Dr. Cristy Friedlander   . Chronic gastritis   .  Dyspnea   . Hepatic steatosis   . History of kidney stones 05/2017  . Hyperlipidemia   . IBS (irritable bowel syndrome)   . Lyme disease    "stari"  . Migraine   . Muscle twitching   . Narcolepsy   . Neuromuscular disorder ([place])   . PONV (postoperative nausea and vomiting)   . Rectal prolapse   . Serrated adenoma of colon   . Umbilical hernia   . Vaginal prolapse   . West Nile Virus infection 2003    Past Surgical History:  Procedure Laterality Date  . 5th R toe  fx  '09  . ABDOMINAL HYSTERECTOMY    . BREAST BIOPSY  '04   neg; right  . CHOLECYSTECTOMY  10/2011  . CYSTOSCOPY WITH STENT PLACEMENT Right 05/25/2017   Procedure: CYSTOSCOPY WITH STENT PLACEMENT;  Surgeon: Hollice Espy, Branch;  Location: [facility];  Service: Urology;  Laterality: Right;  . CYSTOSCOPY WITH STENT PLACEMENT Right 06/21/2017   Procedure: CYSTOSCOPY WITH STENT EXCHANGE;  Surgeon: Hollice Espy, Branch;  Location: [facility];  Service: Urology;  Laterality: Right;  . DIAGNOSTIC LAPAROSCOPY    . EXTRACORPOREAL SHOCK WAVE LITHOTRIPSY Right 06/10/2017   Procedure: EXTRACORPOREAL SHOCK WAVE LITHOTRIPSY (ESWL);  Surgeon: Hollice Espy, Branch;  Location: [facility];  Service: Urology;  Laterality: Right;  . HOLMIUM LASER APPLICATION Right 6/64/4034   Procedure: HOLMIUM LASER APPLICATION;  Surgeon: Hollice Espy, Branch;  Location: [facility];  Service: Urology;  Laterality: Right;  . laprascopy    . PARTIAL HYSTERECTOMY     bleeding  . TONSILLECTOMY    . TONSILLECTOMY    . ULNAR NERVE REPAIR     sx times 5  . UPPER GASTROINTESTINAL ENDOSCOPY  12/04/2004   gastritis  . URETEROSCOPY WITH HOLMIUM LASER LITHOTRIPSY Right 06/21/2017   Procedure: URETEROSCOPY WITH HOLMIUM LASER LITHOTRIPSY;  Surgeon: Hollice Espy, Branch;  Location: [facility];  Service: Urology;  Laterality: Right;    Current Outpatient Medications  Medication Sig Dispense Refill  . cholecalciferol (VITAMIN D) 1000 units tablet Take 5,000 Units by mouth daily.     Marland Kitchen docusate sodium (COLACE) 100 MG capsule Take 1 capsule (100 mg total) by mouth 2 (two) times daily. 60 capsule 0  . Milk Thistle Extract 175 MG TABS     . morphine (MSIR) 30 MG tablet Take 30 mg by mouth every 3 (three) hours as needed for severe pain. UAD PRN    . Multiple Vitamin (MULTIVITAMIN) tablet Take 1 tablet by mouth daily.    . nitrofurantoin, macrocrystal-monohydrate, (MACROBID) 100 MG capsule Take 1 capsule (100 mg total) by mouth daily. 30 capsule 5  .  ondansetron (ZOFRAN) 8 MG tablet Take 1 tablet (8 mg total) by mouth every 8 (eight) hours as needed for nausea or vomiting. 30 tablet 3  . potassium chloride (K-DUR) 10 MEQ tablet Take 1 tablet (10 mEq total) by mouth daily as needed (while on lasix). 30 tablet 0  . vitamin B-12 (CYANOCOBALAMIN) 1000 MCG tablet Place under the tongue.     Current Facility-Administered Medications  Medication Dose Route Frequency Provider Last Rate Last Admin  . fluticasone furoate-vilanterol (BREO ELLIPTA) 200-25 MCG/INH 1 puff  1 puff Inhalation Daily Tyler Pita, Branch        Allergies as of 11/05/2020 - Review Complete 08/28/2020  Allergen Reaction Noted  . Sulfonamide derivatives Nausea And Vomiting     Family History  Problem Relation Age of Onset  . Diabetes Father   .  Hypertension Father   . Stroke Father   . Hypertension Mother   . Cancer Mother        [name]  . Ulcers Mother   . Breast cancer Paternal Grandmother   . Bipolar disorder Other        some family  . Alcohol abuse Maternal Grandfather   . Colon cancer Neg Hx     Social History   Socioeconomic History  . Marital status: Married    Spouse name: Not on file  . Number of children: 0  . Years of education: Not on file  . Highest education level: Not on file  Occupational History  . Occupation: owner  Tobacco Use  . Smoking status: Never Smoker  . Smokeless tobacco: Never Used  . Tobacco comment: non smoker  Vaping Use  . Vaping Use: Never used  Substance and Sexual Activity  . Alcohol use: No    Alcohol/week: 0.0 standard drinks  . Drug use: No    Comment: history of opiate use abuse  . Sexual activity: Not on file  Other Topics Concern  . Not on file  Social History Narrative   Runs a [employer]. Married, no children.    Social Determinants of Health   Financial Resource Strain: Not on file  Food Insecurity: Not on file  Transportation Needs: Not on file  Physical Activity: Not on file   Stress: Not on file  Social Connections: Not on file  Intimate Partner Violence: Not on file    Review of Systems:    Constitutional: No weight loss, fever or chills Skin: No rash  Cardiovascular: No chest pain Respiratory: No SOB Gastrointestinal: See HPI and otherwise negative Genitourinary: No dysuria Neurological: No headache, dizziness or syncope Musculoskeletal: No new muscle or joint pain Hematologic: No bruising Psychiatric: No history of depression or anxiety   Physical Exam:  Vital signs: BP (!) 154/88 (BP Location: Right Arm, Patient Position: Sitting, Cuff Size: Normal)   Pulse (!) 110   Ht _0  (1.676 m) Comment: patient reported; in wheelchair  Wt 185 lb (83.9 kg) Comment: patient reported; in wheelchair  BMI 29.86 kg/m   Constitutional:   Pleasant Caucasian female appears to be in NAD, Well developed, Well nourished, alert and cooperative Head:  Normocephalic and atraumatic. Eyes:   PEERL, EOMI. No icterus. Conjunctiva pink. Ears:  Normal auditory acuity. Neck:  Supple Throat: Oral cavity and pharynx without inflammation, swelling or lesion.  Respiratory: Respirations even and unlabored. Lungs clear to auscultation bilaterally.   No wheezes, crackles, or rhonchi.  Cardiovascular: Normal S1, S2. No MRG. Regular rate and rhythm. No peripheral edema, cyanosis or pallor.  Gastrointestinal:  Soft, nondistended, nontender. No rebound or guarding. Normal bowel sounds. No appreciable masses or hepatomegaly. Rectal:  Not performed.  Msk:  +kyphosis, in a wheelchair, Symmetrical without gross deformities. Without edema, no deformity or joint abnormality.  Neurologic:  Alert and  oriented x4;  grossly normal neurologically.  Skin:   Dry and intact without significant lesions or rashes. Psychiatric: Demonstrates good judgement and reason without abnormal affect or behaviors.  RELEVANT LABS AND IMAGING: CBC    Component Value Date/Time   WBC 7.9 08/28/2020 1644   RBC  5.02 08/28/2020 1644   HGB 13.9 08/28/2020 1644   HGB 13.4 11/12/2015 1506   HCT 42.3 08/28/2020 1644   HCT 40.8 11/12/2015 1506   PLT 206.0 08/28/2020 1644   PLT 236 11/12/2015 1506   MCV 84.2 08/28/2020 1644  MCV 83 11/12/2015 1506   MCV 82 11/17/2011 0249   MCH 27.7 07/20/2020 1839   MCHC 32.9 08/28/2020 1644   RDW 14.4 08/28/2020 1644   RDW 14.1 11/12/2015 1506   RDW 14.0 11/17/2011 0249   LYMPHSABS 2.2 08/28/2020 1644   LYMPHSABS 2.4 11/12/2015 1506   LYMPHSABS 1.2 11/17/2011 0249   MONOABS 0.9 08/28/2020 1644   MONOABS 0.7 11/17/2011 0249   EOSABS 0.2 08/28/2020 1644   EOSABS 0.3 11/12/2015 1506   EOSABS 0.2 11/17/2011 0249   BASOSABS 0.1 08/28/2020 1644   BASOSABS 0.0 11/12/2015 1506   BASOSABS 0.0 11/17/2011 0249    CMP     Component Value Date/Time   NA 137 08/28/2020 1644   NA 141 11/12/2015 1506   NA 136 11/17/2011 0249   K 4.0 08/28/2020 1644   K 3.8 11/17/2011 0249   CL 98 08/28/2020 1644   CL 97 (L) 11/17/2011 0249   CO2 29 08/28/2020 1644   CO2 25 11/17/2011 0249   GLUCOSE 91 08/28/2020 1644   GLUCOSE 103 (H) 11/17/2011 0249   BUN 11 08/28/2020 1644   BUN 8 11/12/2015 1506   BUN 4 (L) 11/17/2011 0249   CREATININE 0.60 08/28/2020 1644   CREATININE 0.55 (L) 11/17/2011 0249   CALCIUM 9.5 08/28/2020 1644   CALCIUM 8.8 11/17/2011 0249   PROT 7.4 08/28/2020 1644   PROT 8.4 11/12/2015 1506   PROT 7.2 11/16/2011 0658   ALBUMIN 4.1 08/28/2020 1644   ALBUMIN 4.1 08/28/2020 1644   ALBUMIN 4.4 11/12/2015 1506   ALBUMIN 2.9 (L) 11/16/2011 0658   AST 46 (H) 08/28/2020 1644   AST 34 11/16/2011 0658   ALT 22 08/28/2020 1644   ALT 34 11/16/2011 0658   ALKPHOS 115 08/28/2020 1644   ALKPHOS 84 11/16/2011 0658   BILITOT 2.2 (H) 08/28/2020 1644   BILITOT 0.6 11/12/2015 1506   BILITOT 0.9 11/16/2011 0658   GFRNONAA >60 07/20/2020 2021   GFRNONAA >60 11/17/2011 0249   GFRAA >60 07/20/2020 2021   GFRAA >60 11/17/2011 0249    Assessment: 1.  Elevated  LFTs: Chronic for the patient, no real change recently, likely due to fatty liver which has been seen on imaging previously but patient has never had a full work-up for this 2.  Chronic diarrhea: For the past 2 years, apparently cyclical, patient thinks related to her dogs whipworms; consider parasite versus infectious cause versus inflammatory versus IBS 3.  History of adenomatous polyps: Last colonoscopy in 2017 with recommendations to repeat in 3 years, patient is overdue but declines to have colonoscopy now due to [place]: 1.  Ordered stool studies to include a GI pathogen panel, O&P, fecal lactoferrin and pancreatic elastase.  Would recommend that she wait until she is in a cycle of diarrhea to return these. 2.  Discussed with the patient that if stool studies are normal then we can empirically treat for 5 days with Flagyl 500 mg 3 times daily.  She request that her husband be treated as well if we end up doing this, apparently he sees Dr. Carlean Purl.  Told her I would run it by him if we end up doing this. 3.  Did discuss that the patient is due for her colonoscopy.  She is very [place] and would have to have her procedure at the hospital due to her being wheelchair-bound.  She would like to wait on this.  Apparently has other medical issues that she sees as a larger  priority and is trying to "triage everything that is going on".  Told her that as long as she is aware of the risks of delayed screening that she can make her own decisions. 4.  Discussed elevated LFTs, these have been related to fatty liver in the past.  Told her I would take a look through her records to see if these had ever been worked up fully before.  It does not appear that they have.  We will add some blood work to look for autoimmune causes etc., most likely this is still her fatty liver.  Ordered repeat hepatic function panel, PT/INR, iron studies with ferritin, alpha-1 antitrypsin, AMA, ANA, ASMA, ceruloplasmin and  hepatitis labs.  She can have these drawn at her convenience. 5.  Patient to follow in clinic per recommendations after labs and testing above.  Ellouise Newer, PA-C [facility] 11/05/2020, 1:26 PM  Cc: Tower, Wynelle Fanny, Branch

## 2020-11-07 NOTE — Progress Notes (Signed)
Addendum: Reviewed and agree with assessment and management plan. Jacynda Brunke M, MD  

## 2020-11-08 ENCOUNTER — Telehealth: Payer: Self-pay | Admitting: Physician Assistant

## 2020-11-08 NOTE — Telephone Encounter (Signed)
Inbound call from patient asking if lab orders can be sent to [facility] in [place], phone#: [phone].

## 2020-11-18 NOTE — Telephone Encounter (Signed)
Called patient and Left voicemail that orders were sent to [facility] last week.

## 2020-11-28 NOTE — Telephone Encounter (Signed)
Inbound call from patient stating she believes she may have covid and would like to wait to have her labs done but is requesting a copy of the lab orders be mailed to her in case she goes to [facility] and they no longer have the orders.  Please advise.

## 2020-11-28 NOTE — Telephone Encounter (Signed)
Mailed a copy of lab order to patient.

## 2021-01-13 ENCOUNTER — Other Ambulatory Visit: Payer: Self-pay | Admitting: Family Medicine

## 2021-01-14 NOTE — Telephone Encounter (Signed)
Last filled on 09/23/20 #30 tabs with 3 refills. Pt re-est care with PCP on 08/28/20, please advise

## 2021-01-21 MED ORDER — ALBENDAZOLE 200 MG PO TABS: 400.0000 mg | ORAL_TABLET | Freq: Every day | ORAL | 0 refills | Status: AC

## 2021-01-21 NOTE — Telephone Encounter (Signed)
Pt's husband called to give you information about their pharmacy. They use [facility] [address] in [place], [place].

## 2021-01-21 NOTE — Telephone Encounter (Signed)
Prescription sent to pharmacy, pt notified via My Chart.

## 2021-01-21 NOTE — Telephone Encounter (Signed)
Lm on vm for patient or husband to return call to confirm which pharmacy the prescriptions need to be sent to and to also confirm patient's husbands DOB and information.

## 2021-03-20 ENCOUNTER — Other Ambulatory Visit: Payer: Self-pay | Admitting: Family Medicine

## 2021-03-20 NOTE — Telephone Encounter (Signed)
Est care appt done on 08/28/20, last filled on 09/23/20 #30 caps with 5 refills, please advise  =

## 2021-05-20 ENCOUNTER — Other Ambulatory Visit: Payer: Self-pay | Admitting: Family Medicine

## 2021-05-20 NOTE — Telephone Encounter (Signed)
Re-est appt was on 08/28/20, no future appts., last filled on 01/14/21 #30 tabs with 3 refills

## 2021-09-12 ENCOUNTER — Encounter: Payer: Self-pay | Admitting: Family Medicine

## 2021-09-15 ENCOUNTER — Other Ambulatory Visit: Payer: Self-pay | Admitting: Family Medicine

## 2021-09-16 NOTE — Telephone Encounter (Signed)
Last filled on 05/20/21 #30 tabs with 3 refills, last OV was 08/28/20, no future

## 2021-09-17 ENCOUNTER — Other Ambulatory Visit: Payer: Self-pay | Admitting: Family Medicine

## 2021-09-17 NOTE — Telephone Encounter (Signed)
Est care appt done on 08/28/20 (no future appt), last filled on 03/20/21 #30 caps with 5 refills, please advise

## 2021-10-27 NOTE — Telephone Encounter (Signed)
She needs to be seen this week.  Either by me or APP. Please work to get her in. Will needs labs, repeat imaging and discussion about EGD/colon Previous imaging of the abd abnormal

## 2021-10-28 NOTE — Telephone Encounter (Signed)
Dr. Hilarie Fredrickson, you have no available appts this week. Next available APP appt is not until 11/04/21.

## 2021-10-28 NOTE — Telephone Encounter (Signed)
Per Dr Hilarie Fredrickson, patient may see Tye Savoy, NP tomorrow, 10/29/21 if needed (he and Nevin Bloodgood have discussed an add on appointment) or may see him later in the week should there be a cancellation. Patient asks if she can have a virtual visit and Dr Hilarie Fredrickson does feel this would be okay. Patient is currently scheduled with Tye Savoy, NP tomorrow but knows I will contact her later today should Dr Hilarie Fredrickson have a cancellation and be able to see her instead.

## 2021-10-29 ENCOUNTER — Encounter: Payer: Self-pay | Admitting: Nurse Practitioner

## 2021-10-29 ENCOUNTER — Telehealth (INDEPENDENT_AMBULATORY_CARE_PROVIDER_SITE_OTHER): Payer: 59 | Admitting: Nurse Practitioner

## 2021-10-29 DIAGNOSIS — Z8371 Family history of colonic polyps: Secondary | ICD-10-CM | POA: Diagnosis not present

## 2021-10-29 DIAGNOSIS — R112 Nausea with vomiting, unspecified: Secondary | ICD-10-CM

## 2021-10-29 DIAGNOSIS — K5900 Constipation, unspecified: Secondary | ICD-10-CM | POA: Diagnosis not present

## 2021-10-29 MED ORDER — PLENVU 140 G PO SOLR: 1.0000 | ORAL | 0 refills | Status: DC

## 2021-10-29 MED ORDER — FAMOTIDINE 20 MG PO TABS: 20.0000 mg | ORAL_TABLET | Freq: Two times a day (BID) | ORAL | 3 refills | Status: DC

## 2021-10-29 MED ORDER — METOCLOPRAMIDE HCL 10 MG PO TABS: ORAL_TABLET | ORAL | 0 refills | Status: DC

## 2021-10-29 NOTE — Patient Instructions (Signed)
If you are age 66 or older, your body mass index should be between 23-30. Your There is no height or weight on file to calculate BMI. If this is out of the aforementioned range listed, please consider follow up with your Primary Care Provider. ___________________________________________  The [facility] providers would like to encourage you to use [facility] to communicate with providers for non-urgent requests or questions.  Due to long hold times on the telephone, sending your provider a message by [facility] may be a faster and more efficient way to get a response.  Please allow 48 business hours for a response.  Please remember that this is for non-urgent requests.  _______________________________________________________  Dennis Bast have been scheduled for an endoscopy and colonoscopy. Please follow the written instructions given to you at your visit today. Please pick up your prep supplies at the pharmacy within the next 1-3 days. If you use inhalers (even only as needed), please bring them with you on the day of your procedure.  Increase your Zofran to 8 mg twice daily  Increase Famotidine 20 mg to twice daily. We have sent this to your pharmacy.  We have sent in Reglan 10 mg you will take 1 tablet 30 minutes to 1 hour prior to starting your bowel prep (1 in the afternoon and 1 in the morning)  Call the office if symptoms worsen.  Follow up pending the results of your Colonoscopy/Endoscopy.  Thank you for entrusting me with your care and choosing [facility].  Tye Savoy, NP-C

## 2021-10-29 NOTE — Progress Notes (Signed)
° °This service was provided via telemedicine with audio/visual communication. °The patient was located at home °The provider was located in provider's GI office. °The patient did consent to this telephone visit and is aware of possible charges through their insurance for this visit.   °The persons participating in this telemedicine service were the patient and I. °Time spent on call:  22 minutes ° ° °ASSESSMENT AND PLAN   ° °#66-year-old female with several week history of nausea, vomiting and decreased appetite. .  She was also having concurrent constipation now resolved with stool softeners and laxatives.  Etiology of ongoing nausea, vomiting and diminished appetite is unclear.  No recent medication changes at home.  Not taking NSAIDs.  °-- For further evaluation patient will be scheduled for an EGD. The risks and benefits of EGD with possible biopsies were discussed with the patient who agrees to proceed.  °--She takes 8 mg of Zofran once daily when she takes her daily dose of Macrobid.  For now I will increase Zofran to 8 mg twice daily as needed.  Happy to call in a refill if she needs it.  °-- Not sure why the recent onset of constipation which has clearly resolved.  Would continue to hold the stool softeners and laxatives for now since they caused such severe diarrhea ° °#Pyrosis.  Taking Rolaids and famotidine at home (dosage unknown).  °-- Recommend famotidine 20 mg twice daily.  Will send in prescription and add this to her home med list ° °#History of colon polyps, overdue for surveillance colonoscopy.  Patient has wanted to postpone colonoscopy during the COVID pandemic.  Additionally she was waiting on surgery for a prolapsed bladder (now on hold).  Patient would like to proceed with colonoscopy given her history of colon polyps but she is also concerned about the recent bowel change in the form of constipation.  °-- I did express some concerns about her ability to tolerate the bowel prep given nausea  and vomiting.  She certainly wants to try.  Schedule for a colonoscopy. The risks and benefits of colonoscopy with possible polypectomy / biopsies were discussed and the patient agrees to proceed.  °--Reglan 10 mg # 2 . Take a dose 1 hour prior to each portion of the bowel prep to hopefully prevent vomiting of the bowel..  ° °HISTORY OF PRESENT ILLNESS   ° °Chief Complaint : Nausea, vomiting, decreased appetite, recent constipation,  ° °[name] Branch is a 66 y.o. female known to Dr. Pyrtle with a past medical history of colon polyps, kidney stones, HLD , hepatic steatosis.  Additional medical history as listed in PMH .  ° °Patient has a history of adenomatous colon polyps.  Her last colonoscopy was in 2017 with recommendations to repeat in 3 years.  Due to the COVID 19 pandemic patient had declined to have follow-up colonoscopy.  Additionally she was waiting on surgery for a prolapsed bladder which has now been put on hold.  It appears her urogynecologist permanently closed her practice.  Patient currently being evaluated by a different surgeon but he wants to hold off on surgery for now.   ° °Patient called the office late December with a several week history of nausea, vomiting, loss of appetite, and severe constipation which is new problems for her. She has been in "dire straits" but not wanted to go to the ED. She has not been able to keep tolerate much PO due to nausea and vomiting.  After a few weeks   weeks of constipation patient started stool softeners and then eventually added a laxative about 1.5 weeks ago.  She started having diarrhea this past Saturday (4 days ago) .  She immediately stopped the stool softeners and laxatives but continued to have diarrhea for the next few days.  She has not had diarrhea today but then again is not taking much in the way of p.o.  Over the last year she has taken one dose of  Zofran 8 mg daily when she takes her daily dose of Macrobid .  She cannot relate the onset of her GI  symptoms to any new medications.  She does not take NSAIDs.  She cannot know for sure whether there has been associated weight loss.  She tends to retain fluid and knows that she has recently lost some fluid weight but is unsure about actual loss of body mass.   In addition to the nausea /vomiting she has been having heartburn.  She is taking Rolaids and famotidine (unsure about milligram).  The heartburn is worse with food.  Current Medications, Allergies, Past Medical History, Past Surgical History, Family History and Social History were reviewed in [employer] record.     Current Outpatient Medications  Medication Sig Dispense Refill   famotidine (PEPCID) 20 MG tablet Take 1 tablet (20 mg total) by mouth 2 (two) times daily. 60 tablet 3   metoCLOPramide (REGLAN) 10 MG tablet Take 1 tablet 30 minutes to 1 hour prior to starting bowel prep 2 tablet 0   PEG-KCl-NaCl-NaSulf-Na Asc-C (PLENVU) 140 g SOLR Take 1 kit by mouth as directed. 1 each 0   cholecalciferol (VITAMIN D) 1000 units tablet Take 5,000 Units by mouth daily.      docusate sodium (COLACE) 100 MG capsule Take 1 capsule (100 mg total) by mouth 2 (two) times daily. 60 capsule 0   Milk Thistle Extract 175 MG TABS Take 1 tablet by mouth daily.     morphine (MSIR) 30 MG tablet Take 30 mg by mouth every 3 (three) hours as needed for severe pain. UAD PRN     Multiple Vitamin (MULTIVITAMIN) tablet Take 1 tablet by mouth daily.     nitrofurantoin, macrocrystal-monohydrate, (MACROBID) 100 MG capsule TAKE 1 CAPSULE BY MOUTH EVERY DAY 30 capsule 3   ondansetron (ZOFRAN) 8 MG tablet TAKE 1 TABLET BY MOUTH EVERY 8 HOURS AS NEEDED FOR NAUSEA AND VOMITING 30 tablet 3   vitamin B-12 (CYANOCOBALAMIN) 1000 MCG tablet Place under the tongue.     Current Facility-Administered Medications  Medication Dose Route Frequency Provider Last Rate Last Admin   fluticasone furoate-vilanterol (BREO ELLIPTA) 200-25 MCG/INH 1 puff  1 puff  Inhalation Daily Tyler Pita, MD       Exam:  Physical exam unable to be performed.  Over videoconference patient appears to not be in any acute distress.  She is pleasant, alert and oriented   Review of Systems: No chest pain. No shortness of breath. No urinary complaints.   PHYSICAL EXAM :    Wt Readings from Last 3 Encounters:  11/05/20 185 lb (83.9 kg)  08/28/20 202 lb (91.6 kg)  07/20/20 185 lb (83.9 kg)    There were no vitals taken for this visit.  Tye Savoy, NP  10/29/2021, 6:31 PM

## 2021-10-30 ENCOUNTER — Encounter: Payer: Self-pay | Admitting: Internal Medicine

## 2021-10-30 MED ORDER — ONDANSETRON HCL 8 MG PO TABS: 8.0000 mg | ORAL_TABLET | Freq: Two times a day (BID) | ORAL | 0 refills | Status: DC

## 2021-10-30 NOTE — Progress Notes (Signed)
Addendum: Reviewed and agree with assessment and management plan. Chanci Ojala M, MD  

## 2021-10-31 ENCOUNTER — Other Ambulatory Visit: Payer: Self-pay | Admitting: Physician Assistant

## 2021-10-31 DIAGNOSIS — R7989 Other specified abnormal findings of blood chemistry: Secondary | ICD-10-CM

## 2021-10-31 DIAGNOSIS — Z86010 Personal history of colonic polyps: Secondary | ICD-10-CM

## 2021-10-31 DIAGNOSIS — K760 Fatty (change of) liver, not elsewhere classified: Secondary | ICD-10-CM

## 2021-10-31 DIAGNOSIS — K529 Noninfective gastroenteritis and colitis, unspecified: Secondary | ICD-10-CM

## 2021-10-31 DIAGNOSIS — R1011 Right upper quadrant pain: Secondary | ICD-10-CM

## 2021-11-25 ENCOUNTER — Encounter: Payer: Self-pay | Admitting: Internal Medicine

## 2021-11-26 ENCOUNTER — Encounter: Payer: Self-pay | Admitting: Emergency Medicine

## 2021-11-26 ENCOUNTER — Emergency Department: Payer: 59

## 2021-11-26 ENCOUNTER — Observation Stay
Admission: EM | Admit: 2021-11-26 | Discharge: 2021-11-27 | Disposition: A | Discharge status: Home / Self Care | Payer: 59 | Attending: Internal Medicine | Admitting: Internal Medicine

## 2021-11-26 ENCOUNTER — Other Ambulatory Visit: Payer: Self-pay

## 2021-11-26 DIAGNOSIS — E860 Dehydration: Secondary | ICD-10-CM | POA: Insufficient documentation

## 2021-11-26 DIAGNOSIS — E876 Hypokalemia: Secondary | ICD-10-CM | POA: Diagnosis not present

## 2021-11-26 DIAGNOSIS — R531 Weakness: Secondary | ICD-10-CM

## 2021-11-26 DIAGNOSIS — J810 Acute pulmonary edema: Secondary | ICD-10-CM | POA: Insufficient documentation

## 2021-11-26 DIAGNOSIS — Z20822 Contact with and (suspected) exposure to covid-19: Secondary | ICD-10-CM | POA: Insufficient documentation

## 2021-11-26 DIAGNOSIS — R0602 Shortness of breath: Principal | ICD-10-CM | POA: Insufficient documentation

## 2021-11-26 DIAGNOSIS — R112 Nausea with vomiting, unspecified: Secondary | ICD-10-CM | POA: Diagnosis not present

## 2021-11-26 DIAGNOSIS — R1110 Vomiting, unspecified: Secondary | ICD-10-CM

## 2021-11-26 DIAGNOSIS — G8929 Other chronic pain: Secondary | ICD-10-CM

## 2021-11-26 LAB — COMPREHENSIVE METABOLIC PANEL
ALT: 27 U/L (ref 0–44)
AST: 56 U/L — ABNORMAL HIGH (ref 15–41)
Albumin: 3.6 g/dL (ref 3.5–5.0)
Alkaline Phosphatase: 87 U/L (ref 38–126)
Anion gap: 8 (ref 5–15)
BUN: 8 mg/dL (ref 8–23)
CO2: 25 mmol/L (ref 22–32)
Calcium: 9.2 mg/dL (ref 8.9–10.3)
Chloride: 104 mmol/L (ref 98–111)
Creatinine, Ser: 0.61 mg/dL (ref 0.44–1.00)
GFR, Estimated: >60 mL/min (ref >60)
Glucose, Bld: 116 mg/dL — ABNORMAL HIGH (ref 70–99)
Potassium: 3.3 mmol/L — ABNORMAL LOW (ref 3.5–5.1)
Sodium: 137 mmol/L (ref 135–145)
Total Bilirubin: 2.1 mg/dL — ABNORMAL HIGH (ref 0.3–1.2)
Total Protein: 6.8 g/dL (ref 6.5–8.1)

## 2021-11-26 LAB — URINALYSIS, COMPLETE (UACMP) WITH MICROSCOPIC
Bilirubin Urine: NEGATIVE
Glucose, UA: NEGATIVE mg/dL
Hgb urine dipstick: NEGATIVE
Ketones, ur: 15 mg/dL — AB
Nitrite: NEGATIVE
Protein, ur: NEGATIVE mg/dL
Specific Gravity, Urine: 1.015 (ref 1.005–1.030)
pH: 6.5 (ref 5.0–8.0)

## 2021-11-26 LAB — RESP PANEL BY RT-PCR (FLU A&B, COVID) ARPGX2
Influenza A by PCR: NEGATIVE
Influenza B by PCR: NEGATIVE
SARS Coronavirus 2 by RT PCR: NEGATIVE

## 2021-11-26 LAB — CBC
HCT: 41.7 % (ref 36.0–46.0)
Hemoglobin: 13.2 g/dL (ref 12.0–15.0)
MCH: 27.8 pg (ref 26.0–34.0)
MCHC: 31.7 g/dL (ref 30.0–36.0)
MCV: 88 fL (ref 80.0–100.0)
Platelets: 160 10*3/uL (ref 150–400)
RBC: 4.74 MIL/uL (ref 3.87–5.11)
RDW: 13.2 % (ref 11.5–15.5)
WBC: 3.7 10*3/uL — ABNORMAL LOW (ref 4.0–10.5)
nRBC: 0 % (ref 0.0–0.2)

## 2021-11-26 LAB — LACTIC ACID, PLASMA: Lactic Acid, Venous: 1.8 mmol/L (ref 0.5–1.9)

## 2021-11-26 LAB — MAGNESIUM: Magnesium: 1.9 mg/dL (ref 1.7–2.4)

## 2021-11-26 LAB — LIPASE, BLOOD: Lipase: 27 U/L (ref 11–51)

## 2021-11-26 LAB — BRAIN NATRIURETIC PEPTIDE: B Natriuretic Peptide: 5.5 pg/mL (ref 0.0–100.0)

## 2021-11-26 LAB — D-DIMER, QUANTITATIVE: D-Dimer, Quant: 0.44 ug/mL-FEU (ref 0.00–0.50)

## 2021-11-26 LAB — TSH: TSH: 1.008 u[IU]/mL (ref 0.350–4.500)

## 2021-11-26 MED ORDER — MORPHINE SULFATE 15 MG PO TABS
30.0000 mg | ORAL_TABLET | ORAL | Status: DC | PRN
  Administered 2021-11-27 (×4): 30 mg via ORAL
  Filled 2021-11-26 (×4): qty 2

## 2021-11-26 MED ORDER — FAMOTIDINE 20 MG PO TABS
20.0000 mg | ORAL_TABLET | Freq: Two times a day (BID) | ORAL | Status: DC
  Administered 2021-11-26 – 2021-11-27 (×2): 20 mg via ORAL
  Filled 2021-11-26 (×2): qty 1

## 2021-11-26 MED ORDER — ONDANSETRON HCL 4 MG/2ML IJ SOLN: 4.0000 mg | Freq: Four times a day (QID) | INTRAMUSCULAR | Status: DC | PRN

## 2021-11-26 MED ORDER — FUROSEMIDE 10 MG/ML IJ SOLN
40.0000 mg | Freq: Once | INTRAMUSCULAR | Status: AC
  Administered 2021-11-26: 40 mg via INTRAVENOUS
  Filled 2021-11-26: qty 4

## 2021-11-26 MED ORDER — NITROFURANTOIN MONOHYD MACRO 100 MG PO CAPS
100.0000 mg | ORAL_CAPSULE | Freq: Every day | ORAL | Status: DC
  Administered 2021-11-26: 100 mg via ORAL
  Filled 2021-11-26 (×2): qty 1

## 2021-11-26 MED ORDER — ENOXAPARIN SODIUM 40 MG/0.4ML IJ SOSY
40.0000 mg | PREFILLED_SYRINGE | INTRAMUSCULAR | Status: DC
  Administered 2021-11-26: 40 mg via SUBCUTANEOUS
  Filled 2021-11-26: qty 0.4

## 2021-11-26 MED ORDER — METOCLOPRAMIDE HCL 5 MG/ML IJ SOLN
10.0000 mg | Freq: Two times a day (BID) | INTRAMUSCULAR | Status: DC
  Administered 2021-11-26 – 2021-11-27 (×2): 10 mg via INTRAVENOUS
  Filled 2021-11-26 (×2): qty 2

## 2021-11-26 MED ORDER — SODIUM CHLORIDE 0.9 % IV SOLN
1000.0000 mL | Freq: Once | INTRAVENOUS | Status: AC
  Administered 2021-11-26: 1000 mL via INTRAVENOUS

## 2021-11-26 MED ORDER — KCL IN DEXTROSE-NACL 20-5-0.45 MEQ/L-%-% IV SOLN
INTRAVENOUS | Status: DC
  Filled 2021-11-26 (×4): qty 1000

## 2021-11-26 NOTE — H&P (Signed)
History and Physical    [name] Branch ZOX:[id number] DOB: [birth year] DOA: 11/26/2021  PCP: Abner Greenspan, MD  Patient coming from: home   Chief Complaint: chronic vomiting  HPI: [name] Branch is a 66 y.o. female with medical history significant for obesity, chronic pain, IBS, who presents with the above.  Reports at least 6 weeks of nbnb emesis. No diarrhea. Occasional abdominal pain that migrates, is not severe. Was controlled with zofran but has not been well controlled last few days. Feeling lightheaded at home, thinks urinating less. No diarrhea. No lower extremity swelling. No chest pain or cough or sob.   ED Course:   Cxr shows possible pulmonary edema so given lasix. Admitted for possible chf exacerbation.  Review of Systems: As per HPI otherwise 10 point review of systems negative.    Past Medical History:  Diagnosis Date   Allergic rhinitis    Arthritis    Chronic fatigue syndrome    CFIDs; also chronic joint and mucle pain (possible tik bourne illness?). Dr. Cristy Friedlander    Chronic gastritis    Dyspnea    Hepatic steatosis    History of kidney stones 05/2017   Hyperlipidemia    IBS (irritable bowel syndrome)    Lyme disease    "stari"   Migraine    Muscle twitching    Narcolepsy    Neuromuscular disorder (HCC)    PONV (postoperative nausea and vomiting)    Rectal prolapse    Serrated adenoma of colon    Umbilical hernia    Vaginal prolapse    West Nile Virus infection 2003    Past Surgical History:  Procedure Laterality Date   5th R toe fx  '09   ABDOMINAL HYSTERECTOMY     BREAST BIOPSY  '04   neg; right   CHOLECYSTECTOMY  10/2011   CYSTOSCOPY WITH STENT PLACEMENT Right 05/25/2017   Procedure: CYSTOSCOPY WITH STENT PLACEMENT;  Surgeon: Hollice Espy, MD;  Location: [facility];  Service: Urology;  Laterality: Right;   CYSTOSCOPY WITH STENT PLACEMENT Right 06/21/2017   Procedure: CYSTOSCOPY WITH STENT EXCHANGE;  Surgeon: Hollice Espy, MD;  Location:  [facility];  Service: Urology;  Laterality: Right;   DIAGNOSTIC LAPAROSCOPY     EXTRACORPOREAL SHOCK WAVE LITHOTRIPSY Right 06/10/2017   Procedure: EXTRACORPOREAL SHOCK WAVE LITHOTRIPSY (ESWL);  Surgeon: Hollice Espy, MD;  Location: [facility];  Service: Urology;  Laterality: Right;   HOLMIUM LASER APPLICATION Right 06/05/9146   Procedure: HOLMIUM LASER APPLICATION;  Surgeon: Hollice Espy, MD;  Location: [facility];  Service: Urology;  Laterality: Right;   laprascopy     PARTIAL HYSTERECTOMY     bleeding   TONSILLECTOMY     TONSILLECTOMY     ULNAR NERVE REPAIR     sx times 5   UPPER GASTROINTESTINAL ENDOSCOPY  12/04/2004   gastritis   URETEROSCOPY WITH HOLMIUM LASER LITHOTRIPSY Right 06/21/2017   Procedure: URETEROSCOPY WITH HOLMIUM LASER LITHOTRIPSY;  Surgeon: Hollice Espy, MD;  Location: [facility];  Service: Urology;  Laterality: Right;     reports that she has never smoked. She has never used smokeless tobacco. She reports that she does not drink alcohol and does not use drugs.  Allergies  Allergen Reactions   Other Nausea Only and Nausea And Vomiting    All Antibiotics   Sulfonamide Derivatives Nausea And Vomiting    Family History  Problem Relation Age of Onset   Diabetes Father    Hypertension Father    Stroke Father  Cancer Father    Hypertension Mother    Cancer Mother        Waldenstroms   Ulcers Mother    Breast cancer Paternal Grandmother    Bipolar disorder Other        some family   Alcohol abuse Maternal Grandfather    Colon cancer Neg Hx    Esophageal cancer Neg Hx    Pancreatic cancer Neg Hx    Ulcerative colitis Neg Hx     Prior to Admission medications   Medication Sig Start Date End Date Taking? Authorizing Provider  cholecalciferol (VITAMIN D) 1000 units tablet Take 5,000 Units by mouth daily.     [provider]  docusate sodium (COLACE) 100 MG capsule Take 1 capsule (100 mg total) by mouth 2 (two) times daily. 06/10/17   Hollice Espy,  MD  famotidine (PEPCID) 20 MG tablet Take 1 tablet (20 mg total) by mouth 2 (two) times daily. 10/29/21   Willia Craze, NP  metoCLOPramide (REGLAN) 10 MG tablet Take 1 tablet 30 minutes to 1 hour prior to starting bowel prep 10/29/21   Willia Craze, NP  Milk Thistle Extract 175 MG TABS Take 1 tablet by mouth daily. 10/27/19   [provider]  morphine (MSIR) 30 MG tablet Take 30 mg by mouth every 3 (three) hours as needed for severe pain. UAD PRN    [provider]  Multiple Vitamin (MULTIVITAMIN) tablet Take 1 tablet by mouth daily.    [provider]  nitrofurantoin, macrocrystal-monohydrate, (MACROBID) 100 MG capsule TAKE 1 CAPSULE BY MOUTH EVERY DAY 09/17/21   Tower, Wynelle Fanny, MD  ondansetron (ZOFRAN) 8 MG tablet Take 1 tablet (8 mg total) by mouth 2 (two) times daily. 10/30/21   Willia Craze, NP  PEG-KCl-NaCl-NaSulf-Na Asc-C (PLENVU) 140 g SOLR Take 1 kit by mouth as directed. 10/29/21   Willia Craze, NP  vitamin B-12 (CYANOCOBALAMIN) 1000 MCG tablet Place under the tongue.    [provider]    Physical Exam: Vitals:   11/26/21 1547 11/26/21 1549 11/26/21 1630  BP:  (!) 157/68 122/76  Pulse:  (!) 119 (!) 115  Resp:  18 19  Temp:  98.7 F (37.1 C)   TempSrc:  Oral   SpO2:  93% 95%  Weight: 83.9 kg    Height: $Remove'5\' 6"'OhUMsOm$  (1.676 m)      Constitutional: No acute distress Head: Atraumatic Eyes: Conjunctiva clear ENM: Moist mucous membranes. Normal dentition.  Neck: Supple Respiratory: Clear to auscultation bilaterally, no wheezing/rales/rhonchi. Normal respiratory effort. No accessory muscle use. . Cardiovascular: tachycardia. No murmurs/rubs/gallops. Abdomen: Non-tender, non-distended. No masses. No rebound or guarding. Positive bowel sounds. Musculoskeletal: No joint deformity upper and lower extremities. Normal ROM, no contractures. Normal muscle tone.  Skin: No rashes, lesions, or ulcers.  Extremities: No peripheral edema. Palpable  peripheral pulses. Neurologic: Alert, moving all 4 extremities. Psychiatric: Normal insight and judgement.   Labs on Admission: I have personally reviewed following labs and imaging studies  CBC: Recent Labs  Lab 11/26/21 1550  WBC 3.7*  HGB 13.2  HCT 41.7  MCV 88.0  PLT 619   Basic Metabolic Panel: Recent Labs  Lab 11/26/21 1550  NA 137  K 3.3*  CL 104  CO2 25  GLUCOSE 116*  BUN 8  CREATININE 0.61  CALCIUM 9.2  MG 1.9   GFR: Estimated Creatinine Clearance: 76.5 mL/min (by C-G formula based on SCr of 0.61 mg/dL). Liver Function Tests: Recent Labs  Lab 11/26/21 1550  AST 56*  ALT 27  ALKPHOS 87  BILITOT 2.1*  PROT 6.8  ALBUMIN 3.6   No results for input(s): LIPASE, AMYLASE in the last 168 hours. No results for input(s): AMMONIA in the last 168 hours. Coagulation Profile: No results for input(s): INR, PROTIME in the last 168 hours. Cardiac Enzymes: No results for input(s): CKTOTAL, CKMB, CKMBINDEX, TROPONINI in the last 168 hours. BNP (last 3 results) No results for input(s): PROBNP in the last 8760 hours. HbA1C: No results for input(s): HGBA1C in the last 72 hours. CBG: No results for input(s): GLUCAP in the last 168 hours. Lipid Profile: No results for input(s): CHOL, HDL, LDLCALC, TRIG, CHOLHDL, LDLDIRECT in the last 72 hours. Thyroid Function Tests: No results for input(s): TSH, T4TOTAL, FREET4, T3FREE, THYROIDAB in the last 72 hours. Anemia Panel: No results for input(s): VITAMINB12, FOLATE, FERRITIN, TIBC, IRON, RETICCTPCT in the last 72 hours. Urine analysis:    Component Value Date/Time   COLORURINE YELLOW 11/26/2021 1751   APPEARANCEUR CLEAR (A) 11/26/2021 1751   APPEARANCEUR Clear 07/01/2017 1126   LABSPEC 1.015 11/26/2021 1751   PHURINE 6.5 11/26/2021 1751   GLUCOSEU NEGATIVE 11/26/2021 1751   HGBUR NEGATIVE 11/26/2021 1751   HGBUR negative 08/06/2010 1154   BILIRUBINUR NEGATIVE 11/26/2021 1751   BILIRUBINUR 1 mg/dL 09/17/2020 1421    BILIRUBINUR Negative 07/01/2017 1126   KETONESUR 15 (A) 11/26/2021 1751   PROTEINUR NEGATIVE 11/26/2021 1751   UROBILINOGEN 0.2 09/17/2020 1421   UROBILINOGEN 0.2 08/06/2010 1154   NITRITE NEGATIVE 11/26/2021 1751   LEUKOCYTESUR SMALL (A) 11/26/2021 1751    Radiological Exams on Admission: DG Abdomen 1 View  Result Date: 11/26/2021 CLINICAL DATA:  constipation, nausea vomiting EXAM: ABDOMEN - 1 VIEW COMPARISON:  X-ray abdomen 06/17/2017 FINDINGS: Limited evaluation due to overlapping osseous structures and overlying soft tissues. The bowel gas pattern is normal. No radio-opaque calculi or other significant radiographic abnormality are seen. Degenerative changes of the lumbar spine with chronic compression fracture of the L1 vertebral body. IMPRESSION: Nonobstructive bowel gas pattern. Electronically Signed   By: Iven Finn M.D.   On: 11/26/2021 17:47   DG Chest Port 1 View  Result Date: 11/26/2021 CLINICAL DATA:  Weakness EXAM: PORTABLE CHEST 1 VIEW COMPARISON:  Prior chest x-ray 09/19/2018 FINDINGS: Cardiac and mediastinal contours remain unchanged. Increased pulmonary vascular congestion bordering on mild edema. Chronic bronchitic changes are similar compared to prior. No pneumothorax or large effusion. No acute osseous abnormality. IMPRESSION: 1. Increased pulmonary vascular congestion bordering on mild interstitial edema. Given normal cardiac size, an atypical or viral respiratory infection could appear similar in the appropriate clinical context. 2. Stable chronic bronchitic changes. Electronically Signed   By: Jacqulynn Cadet M.D.   On: 11/26/2021 16:26    EKG: Independently reviewed. Tachycardia. Questionable a fib  Assessment/Plan Principal Problem:   Shortness of breath Active Problems:   Chronic vomiting   Nausea and vomiting   Hypokalemia   # Chronic nausea/vomiting Established with [facility], plan is for outpatient colonoscopy/egd next week. Abd x ray unremarkable. -  f/u lipase, tsh - managing dehydration as below - continue zofran - f/u ua - add standing reglan - likely d/c home in 24-48 hours with gi f/u to continue this w/u  # Dehydration Here tachycardic, hypokalemic, in setting of chronic nausea/vomiting as above. Unfortunately was treated in the ED with lasix. - start IVF  # Hypokalemia Mild, 2/2 nausea/vomiting. Mg wnl - kcl w/ IVF  # Questional a  fib ED EKG read as a fib but I am not convinced - fluid resuscitation as above - tele - repeat AM EKG  # Chronic pain - cont home morphine  # Increased pulmonary vascular congestion On cxr. EDP treated for chf but has no hx of chf/cad, no new swelling or sob, and bnp is markedly low at 5. This is not chf - indeed, the patient is volume depleted  # Recurrent uti - on macrobid suppression, will continue    DVT prophylaxis: lovenox Code Status: full  Family Communication: husband updated @ bedside  Consults called: none   Level of care: Telemetry Medical Status is: Observation The patient remains OBS appropriate and will d/c before 2 midnights.          [name] Maxim MD [facility] Pager [phone]  If 7PM-7AM, please contact night-coverage [email or web address] Password [facility]  11/26/2021, 6:03 PM

## 2021-11-26 NOTE — ED Notes (Signed)
Patient placed on pure wick °

## 2021-11-26 NOTE — ED Provider Notes (Signed)
[facility] Provider Note    Event Date/Time   First MD Initiated Contact with Patient 11/26/21 1545     (approximate)   History   Weakness   HPI  [name] Branch is a 66 y.o. female with a history of Lyme disease, narcolepsy, neuromuscular disorder, chronic fatigue syndrome who presents with complaints of joint and muscle aches and generalized weakness.  She reports several weeks of nausea.  The last 2 days has felt very weak with low energy.  She reports increased work of breathing which is atypical for her.  Denies chest pain.     Physical Exam   Triage Vital Signs: ED Triage Vitals  Enc Vitals Group     BP 11/26/21 1549 (!) 157/68     Pulse Rate 11/26/21 1549 (!) 119     Resp 11/26/21 1549 18     Temp 11/26/21 1549 98.7 F (37.1 C)     Temp Source 11/26/21 1549 Oral     SpO2 11/26/21 1549 93 %     Weight 11/26/21 1547 83.9 kg (185 lb)     Height 11/26/21 1547 1.676 m (5\' 6" )     Head Circumference --      Peak Flow --      Pain Score 11/26/21 1546 5     Pain Loc --      Pain Edu? --      Excl. in [place]? --     Most recent vital signs: Vitals:   11/26/21 1549 11/26/21 1630  BP: (!) 157/68 122/76  Pulse: (!) 119 (!) 115  Resp: 18 19  Temp: 98.7 F (37.1 C)   SpO2: 93% 95%     General: Awake, no distress.  CV:  Good peripheral perfusion.  Tachycardia Resp:  Normal effort.  Bibasilar Rales Abd:  No distention.  No abdominal tenderness Other:  No lower extremity edema   ED Results / Procedures / Treatments   Labs (all labs ordered are listed, but only abnormal results are displayed) Labs Reviewed  CBC - Abnormal; Notable for the following components:      Result Value   WBC 3.7 (*)    All other components within normal limits  COMPREHENSIVE METABOLIC PANEL - Abnormal; Notable for the following components:   Potassium 3.3 (*)    Glucose, Bld 116 (*)    AST 56 (*)    Total Bilirubin 2.1 (*)    All other components within  normal limits  URINALYSIS, COMPLETE (UACMP) WITH MICROSCOPIC - Abnormal; Notable for the following components:   APPearance CLEAR (*)    Ketones, ur 15 (*)    Leukocytes,Ua SMALL (*)    Bacteria, UA RARE (*)    All other components within normal limits  RESP PANEL BY RT-PCR (FLU A&B, COVID) ARPGX2  LACTIC ACID, PLASMA  BRAIN NATRIURETIC PEPTIDE  MAGNESIUM  D-DIMER, QUANTITATIVE  HIV ANTIBODY (ROUTINE TESTING W REFLEX)  BASIC METABOLIC PANEL  CBC  LIPASE, BLOOD  TSH  HEPATITIS C ANTIBODY     EKG  ED ECG REPORT I, Lavonia Drafts, the attending physician, personally viewed and interpreted this ECG.  Date: 11/26/2021  Sinus tachycardia QRS Axis: normal Intervals: normal ST/T Wave abnormalities: normal Narrative Interpretation: no evidence of acute ischemia    RADIOLOGY Chest x-ray read by me, concerning for edema    PROCEDURES:  Critical Care performed:   .1-3 Lead EKG Interpretation Performed by: Lavonia Drafts, MD Authorized by: Lavonia Drafts, MD  Interpretation: normal     ECG rate assessment: tachycardic     Rhythm: sinus rhythm     Ectopy: none     Conduction: normal     MEDICATIONS ORDERED IN ED: Medications  morphine (MSIR) tablet 30 mg (has no administration in time range)  nitrofurantoin (macrocrystal-monohydrate) (MACROBID) capsule 100 mg (has no administration in time range)  famotidine (PEPCID) tablet 20 mg (has no administration in time range)  ondansetron (ZOFRAN) injection 4 mg (has no administration in time range)  metoCLOPramide (REGLAN) injection 10 mg (has no administration in time range)  enoxaparin (LOVENOX) injection 40 mg (has no administration in time range)  dextrose 5 % and 0.45 % NaCl with KCl 20 mEq/L infusion (has no administration in time range)  0.9 %  sodium chloride infusion (0 mLs Intravenous Stopped 11/26/21 1820)  furosemide (LASIX) injection 40 mg (40 mg Intravenous Given 11/26/21 1725)     IMPRESSION / MDM /  ASSESSMENT AND PLAN / ED COURSE  I reviewed the triage vital signs and the nursing notes.  Patient presents with generalized weakness as detailed above.  Notably has tachycardia of 115 oxygen saturations in the low 90s.  Lab work notable for mildly low white blood cell count of 3.7, otherwise CMP is reassuring, mild hypokalemia.  X-ray however demonstrates likely pulmonary edema given her tachycardia and borderline pulse ox I suspect she may have new onset CHF which could be causing her weakness and shortness of breath.  Will give IV Lasix, have consulted the hospitalist for admission          FINAL CLINICAL IMPRESSION(S) / ED DIAGNOSES   Final diagnoses:  Generalized weakness  Acute pulmonary edema ([place])     Rx / DC Orders   ED Discharge Orders     None        Note:  This document was prepared using Dragon voice recognition software and may include unintentional dictation errors.   Lavonia Drafts, MD 11/26/21 1840

## 2021-11-26 NOTE — ED Triage Notes (Signed)
Patient to ED via [facility] from home for weakness. Patient states "it feels like my joints and muscles are swollen." Patient has also had N/V since November and suppose to see GI on 12/02/21. Patient alert and oriented at this time.

## 2021-11-27 DIAGNOSIS — G8929 Other chronic pain: Secondary | ICD-10-CM | POA: Diagnosis not present

## 2021-11-27 DIAGNOSIS — R112 Nausea with vomiting, unspecified: Secondary | ICD-10-CM

## 2021-11-27 DIAGNOSIS — E876 Hypokalemia: Secondary | ICD-10-CM

## 2021-11-27 DIAGNOSIS — E860 Dehydration: Secondary | ICD-10-CM | POA: Diagnosis not present

## 2021-11-27 DIAGNOSIS — R0602 Shortness of breath: Secondary | ICD-10-CM | POA: Diagnosis not present

## 2021-11-27 LAB — BASIC METABOLIC PANEL
Anion gap: 7 (ref 5–15)
BUN: 7 mg/dL — ABNORMAL LOW (ref 8–23)
CO2: 29 mmol/L (ref 22–32)
Calcium: 8.6 mg/dL — ABNORMAL LOW (ref 8.9–10.3)
Chloride: 101 mmol/L (ref 98–111)
Creatinine, Ser: 0.69 mg/dL (ref 0.44–1.00)
GFR, Estimated: >60 mL/min (ref >60)
Glucose, Bld: 116 mg/dL — ABNORMAL HIGH (ref 70–99)
Potassium: 3.5 mmol/L (ref 3.5–5.1)
Sodium: 137 mmol/L (ref 135–145)

## 2021-11-27 LAB — CBC
HCT: 38.9 % (ref 36.0–46.0)
Hemoglobin: 12.3 g/dL (ref 12.0–15.0)
MCH: 28.3 pg (ref 26.0–34.0)
MCHC: 31.6 g/dL (ref 30.0–36.0)
MCV: 89.4 fL (ref 80.0–100.0)
Platelets: 171 10*3/uL (ref 150–400)
RBC: 4.35 MIL/uL (ref 3.87–5.11)
RDW: 13.2 % (ref 11.5–15.5)
WBC: 4.7 10*3/uL (ref 4.0–10.5)
nRBC: 0 % (ref 0.0–0.2)

## 2021-11-27 LAB — HEPATITIS C ANTIBODY: HCV Ab: NONREACTIVE

## 2021-11-27 LAB — HIV ANTIBODY (ROUTINE TESTING W REFLEX): HIV Screen 4th Generation wRfx: NONREACTIVE

## 2021-11-27 MED ORDER — METOCLOPRAMIDE HCL 5 MG PO TABS: 5.0000 mg | ORAL_TABLET | Freq: Three times a day (TID) | ORAL | 0 refills | Status: DC

## 2021-11-27 MED ORDER — ONDANSETRON HCL 8 MG PO TABS: 8.0000 mg | ORAL_TABLET | Freq: Two times a day (BID) | ORAL | 0 refills | Status: AC

## 2021-11-27 NOTE — Progress Notes (Signed)
Patient off unit for discharge.  Transported by Psychologist, occupational.

## 2021-11-27 NOTE — TOC Initial Note (Signed)
Transition of Care [facility]) - Initial/Assessment Note    Patient Details  Name: [name] Branch MRN: [id number] Date of Birth: 01/09/1956  Transition of Care [facility]) CM/SW Contact:    Conception Oms, RN Phone Number: 11/27/2021, 8:46 AM  Clinical Narrative:                  Transition of Care [facility] [address] [address]) Screening Note   Patient Details  Name: [name] Branch Date of Birth: 06-07-1956   Transition of Care [facility]) CM/SW Contact:    Conception Oms, RN Phone Number: 11/27/2021, 8:46 AM    Transition of Care Department [facility]) has reviewed patient and no TOC needs have been identified at this time. We will continue to monitor patient advancement through interdisciplinary progression rounds. If new patient transition needs arise, please place a TOC consult.          Patient Goals and CMS Choice        Expected Discharge Plan and Services                                                Prior Living Arrangements/Services                       Activities of Daily Living Home Assistive Devices/Equipment: Eyeglasses, Radio producer (specify quad or straight), Wheelchair, Bedside commode/3-in-1, Shower chair with back ADL Screening (condition at time of admission) Patient's cognitive ability adequate to safely complete daily activities?: Yes Is the patient deaf or have difficulty hearing?: No Does the patient have difficulty seeing, even when wearing glasses/contacts?: No Does the patient have difficulty concentrating, remembering, or making decisions?: No Patient able to express need for assistance with ADLs?: No Does the patient have difficulty dressing or bathing?: Yes Independently performs ADLs?: No Communication: Independent Dressing (OT): Needs assistance Is this a change from baseline?: Pre-admission baseline Grooming: Independent Feeding: Independent Bathing: Needs assistance Is this a change from baseline?: Pre-admission baseline Toileting: Independent  with device (comment) In/Out Bed: Independent with device (comment) Walks in Home: Independent with device (comment) Does the patient have difficulty walking or climbing stairs?: Yes Weakness of Legs: Both Weakness of Arms/Hands: None  Permission Sought/Granted                  Emotional Assessment              Admission diagnosis:  Shortness of breath [R06.02] Acute pulmonary edema (HCC) [J81.0] Generalized weakness [R53.1] Patient Active Problem List   Diagnosis Date Noted   Shortness of breath 11/26/2021   Hypokalemia 11/26/2021   Elevated liver enzymes 08/28/2020   Compression fracture of L1 lumbar vertebra ([place]) 08/28/2020   Mixed stress and urge urinary incontinence 10/29/2017   Frequent UTI 02/18/2016   Abdominal pain, RUQ 02/18/2016   Dyspnea 01/29/2016   Fever, unspecified 11/12/2015   Tremor 11/12/2015   Nausea and vomiting 11/12/2015   UTI (urinary tract infection) 11/08/2015   Productive cough 11/08/2015   Constipation 04/12/2015   Cystocele 04/12/2015   Routine general medical examination at a health care facility 06/26/2014   Osteopenia 06/26/2014   Prediabetes 06/26/2014   Colon cancer screening 06/26/2014   Dysuria 06/26/2014   Intertrigo 07/12/2013   Chronic fatigue syndrome 01/19/2013   Elevated alkaline phosphatase level 09/19/2012   Edema 09/16/2012   Chronic vomiting 08/05/2011  FREQUENCY, URINARY 08/06/2010   Diarrhea 04/14/2010   CONSTIPATION, CHRONIC 07/30/2008   Irritable bowel syndrome 07/30/2008   PURE HYPERCHOLESTEROLEMIA 06/21/2008   POSTMENOPAUSAL STATUS 06/21/2008   VAGINITIS, ATROPHIC 06/21/2008   Pain in limb 06/21/2008   PCP:  Abner Greenspan, MD Pharmacy:   [id number] [place], [place] [address] [place] [address] Phone: [phone] Fax: [phone]  [place], [place] - [address] [place] [address] Phone: [phone] Fax:  [phone]     Social Determinants of Health (SDOH) Interventions    Readmission Risk Interventions No flowsheet data found.

## 2021-11-27 NOTE — Discharge Summary (Signed)
Physician Discharge Summary   Patient: [name] Branch MRN: [id number] DOB: [birth year]  Admit date:     11/26/2021  Discharge date: 11/27/21  Discharge Physician: Loletha Grayer   PCP: Abner Greenspan, MD   Recommendations at discharge:   Follow-up PMD 5 days Keep appointment with gastroenterology for endoscopy and colonoscopy  Discharge Diagnoses: And hospital course. 1.  Chronic nausea vomiting.  Patient will follow up with GI as outpatient for EGD and colonoscopy next week.  Patient was hydrated overnight.  Will prescribe Reglan upon going home and Zofran.  Patient tolerated diet and ready to go home. 2.  Dehydration.  Patient was tachycardic and hypokalemic with nausea vomiting.  Patient was given IV fluids overnight. 3.  Hypokalemia this was replaced with IV fluids.  Magnesium normal range.  Potassium 3.5 on discharge.  4.  Chronic pain on short acting oxycodone 5.  Increased pulmonary vascular congestion seen on chest x-ray but BNP was low at 5.  This is not congestive heart failure. 71.  Macrobid for suppression of UTI         Consultants: None Procedures performed: None Disposition: Home Diet recommendation:  Regular diet  DISCHARGE MEDICATION: Allergies as of 11/27/2021       Reactions   Other Nausea Only, Nausea And Vomiting   All Antibiotics   Sulfonamide Derivatives Nausea And Vomiting        Medication List     STOP taking these medications    Plenvu 140 g Solr Generic drug: PEG-KCl-NaCl-NaSulf-Na Asc-C       TAKE these medications    cholecalciferol 1000 units tablet Commonly known as: VITAMIN D Take 5,000 Units by mouth daily.   docusate sodium 100 MG capsule Commonly known as: COLACE Take 1 capsule (100 mg total) by mouth 2 (two) times daily.   famotidine 20 MG tablet Commonly known as: PEPCID Take 1 tablet (20 mg total) by mouth 2 (two) times daily.   metoCLOPramide 5 MG tablet Commonly known as: Reglan Take 1 tablet (5 mg total) by  mouth 4 (four) times daily -  before meals and at bedtime. What changed:  medication strength how much to take how to take this when to take this additional instructions   Milk Thistle Extract 175 MG Tabs Take 1 tablet by mouth daily.   morphine 30 MG tablet Commonly known as: MSIR Take 30 mg by mouth every 3 (three) hours as needed for severe pain. UAD PRN   multivitamin tablet Take 1 tablet by mouth daily.   nitrofurantoin (macrocrystal-monohydrate) 100 MG capsule Commonly known as: MACROBID TAKE 1 CAPSULE BY MOUTH EVERY DAY   ondansetron 8 MG tablet Commonly known as: ZOFRAN Take 1 tablet (8 mg total) by mouth 2 (two) times daily for 10 days.   vitamin B-12 1000 MCG tablet Commonly known as: CYANOCOBALAMIN Place under the tongue.        Follow-up Information     Tower, Wynelle Fanny, MD Follow up in 5 day(s).   Specialties: Family Medicine, Radiology Contact information: [place] [address] [phone]                 Discharge Exam: Danley Danker Weights   11/26/21 1547  Weight: 83.9 kg   Physical Exam HENT:     Head: Normocephalic.     Mouth/Throat:     Pharynx: No oropharyngeal exudate.  Eyes:     General: Lids are normal.     Conjunctiva/sclera: Conjunctivae normal.  Cardiovascular:     Rate and Rhythm: Normal rate and regular rhythm.     Heart sounds: Normal heart sounds, S1 normal and S2 normal.  Pulmonary:     Breath sounds: No decreased breath sounds, wheezing, rhonchi or rales.  Abdominal:     Palpations: Abdomen is soft.     Tenderness: There is no abdominal tenderness.  Musculoskeletal:     Right lower leg: No swelling.     Left lower leg: No swelling.  Skin:    General: Skin is warm.     Findings: No rash.  Neurological:     Mental Status: She is alert and oriented to person, place, and time.     Condition at discharge: stable  The results of significant diagnostics from this hospitalization (including  imaging, microbiology, ancillary and laboratory) are listed below for reference.   Imaging Studies: DG Abdomen 1 View  Result Date: 11/26/2021 CLINICAL DATA:  constipation, nausea vomiting EXAM: ABDOMEN - 1 VIEW COMPARISON:  X-ray abdomen 06/17/2017 FINDINGS: Limited evaluation due to overlapping osseous structures and overlying soft tissues. The bowel gas pattern is normal. No radio-opaque calculi or other significant radiographic abnormality are seen. Degenerative changes of the lumbar spine with chronic compression fracture of the L1 vertebral body. IMPRESSION: Nonobstructive bowel gas pattern. Electronically Signed   By: Iven Finn M.D.   On: 11/26/2021 17:47   DG Chest Port 1 View  Result Date: 11/26/2021 CLINICAL DATA:  Weakness EXAM: PORTABLE CHEST 1 VIEW COMPARISON:  Prior chest x-ray 09/19/2018 FINDINGS: Cardiac and mediastinal contours remain unchanged. Increased pulmonary vascular congestion bordering on mild edema. Chronic bronchitic changes are similar compared to prior. No pneumothorax or large effusion. No acute osseous abnormality. IMPRESSION: 1. Increased pulmonary vascular congestion bordering on mild interstitial edema. Given normal cardiac size, an atypical or viral respiratory infection could appear similar in the appropriate clinical context. 2. Stable chronic bronchitic changes. Electronically Signed   By: Jacqulynn Cadet M.D.   On: 11/26/2021 16:26    Microbiology: Results for orders placed or performed during the hospital encounter of 11/26/21  Resp Panel by RT-PCR (Flu A&B, Covid) Nasopharyngeal Swab     Status: None   Collection Time: 11/26/21  3:50 PM   Specimen: Nasopharyngeal Swab; Nasopharyngeal(NP) swabs in vial transport medium  Result Value Ref Range Status   SARS Coronavirus 2 by RT PCR NEGATIVE NEGATIVE Final    Comment: (NOTE) SARS-CoV-2 target nucleic acids are NOT DETECTED.  The SARS-CoV-2 RNA is generally detectable in upper respiratory specimens  during the acute phase of infection. The lowest concentration of SARS-CoV-2 viral copies this assay can detect is 138 copies/mL. A negative result does not preclude SARS-Cov-2 infection and should not be used as the sole basis for treatment or other patient management decisions. A negative result may occur with  improper specimen collection/handling, submission of specimen other than nasopharyngeal swab, presence of viral mutation(s) within the areas targeted by this assay, and inadequate number of viral copies(<138 copies/mL). A negative result must be combined with clinical observations, patient history, and epidemiological information. The expected result is Negative.  Fact Sheet for Patients:  EntrepreneurPulse.com.au  Fact Sheet for Healthcare Providers:  IncredibleEmployment.be  This test is no t yet approved or cleared by the [place] FDA and  has been authorized for detection and/or diagnosis of SARS-CoV-2 by FDA under an Emergency Use Authorization (EUA). This EUA will remain  in effect (meaning this test can be used) for the duration of the COVID-19  declaration under Section 564(b)(1) of the Act, 21 U.S.C.section 360bbb-3(b)(1), unless the authorization is terminated  or revoked sooner.       Influenza A by PCR NEGATIVE NEGATIVE Final   Influenza B by PCR NEGATIVE NEGATIVE Final    Comment: (NOTE) The Xpert Xpress SARS-CoV-2/FLU/RSV plus assay is intended as an aid in the diagnosis of influenza from Nasopharyngeal swab specimens and should not be used as a sole basis for treatment. Nasal washings and aspirates are unacceptable for Xpert Xpress SARS-CoV-2/FLU/RSV testing.  Fact Sheet for Patients: EntrepreneurPulse.com.au  Fact Sheet for Healthcare Providers: IncredibleEmployment.be  This test is not yet approved or cleared by the [place] FDA and has been authorized for detection  and/or diagnosis of SARS-CoV-2 by FDA under an Emergency Use Authorization (EUA). This EUA will remain in effect (meaning this test can be used) for the duration of the COVID-19 declaration under Section 564(b)(1) of the Act, 21 U.S.C. section 360bbb-3(b)(1), unless the authorization is terminated or revoked.  Performed at [facility], [address] [place],  [address]     Labs: CBC: Recent Labs  Lab 11/26/21 1550 11/27/21 0406  WBC 3.7* 4.7  HGB 13.2 12.3  HCT 41.7 38.9  MCV 88.0 89.4  PLT 160 993   Basic Metabolic Panel: Recent Labs  Lab 11/26/21 1550 11/27/21 0406  NA 137 137  K 3.3* 3.5  CL 104 101  CO2 25 29  GLUCOSE 116* 116*  BUN 8 7*  CREATININE 0.61 0.69  CALCIUM 9.2 8.6*  MG 1.9  --    Liver Function Tests: Recent Labs  Lab 11/26/21 1550  AST 56*  ALT 27  ALKPHOS 87  BILITOT 2.1*  PROT 6.8  ALBUMIN 3.6     Discharge time spent: greater than 30 minutes.  Signed: Loletha Grayer, MD [facility] 11/27/2021

## 2021-11-28 ENCOUNTER — Telehealth: Payer: Self-pay | Admitting: Internal Medicine

## 2021-11-28 ENCOUNTER — Telehealth: Payer: Self-pay

## 2021-11-28 NOTE — Telephone Encounter (Signed)
[place] Night - Client Nonclinical Telephone Record  [facility] Client [facility] Night - Client Client Site [place] Provider Loura Pardon - MD Contact Type Call Who Is Calling Patient / Member / Family / Caregiver Caller Name [place] Phone Number [phone] Patient Name [name] Branch Patient DOB May 01, 1956 Call Type Message Only Information Provided Reason for Call Request to Schedule Office Appointment Initial Comment Caller states she would like to make an appointment for a patient. Patient request to speak to RN No Additional Comment Provided office hours. Declined triage. Disp. Time Disposition Final User 11/27/2021 5:14:08 PM General Information Provided Yes [name] Call Closed By: [name] Transaction Date/Time: 11/27/2021 5:11:45 PM (ET

## 2021-11-28 NOTE — Telephone Encounter (Signed)
Pt called and wanted to know if it was ok for her to take a bladder analgesic and if it was ok for her to take zofran as she has ECL scheduled for 12/02/21. Discussed with pt that it is fine for her to take those medications.

## 2021-11-28 NOTE — Telephone Encounter (Signed)
Patient called and stated that she is having both ENDO and Colon done on 2/7. Stated that she has some questions about some of her medications. Seeking advice, please advise.

## 2021-11-28 NOTE — Telephone Encounter (Signed)
Unable to reach pt by phone. No answer and no v.m. will send note to lsc support.

## 2021-12-01 ENCOUNTER — Encounter: Payer: Self-pay | Admitting: Family Medicine

## 2021-12-01 ENCOUNTER — Telehealth: Payer: Self-pay

## 2021-12-01 NOTE — Telephone Encounter (Signed)
See pt's question. Appt is your 4pm but it was scheduled for in office but pt is requesting it to be a mychart/virtual appt.   Will route to PCP for approval since appt is a hospital f/u

## 2021-12-01 NOTE — Telephone Encounter (Signed)
Transition Care Management Follow-up Telephone Call Date of discharge and from where: TCM DC [facility] 11-27-21 Dx: n/v- dehydration How have you been since you were released from the hospital? Feeling OK Any questions or concerns? No  Items Reviewed: Did the pt receive and understand the discharge instructions provided? Yes  Medications obtained and verified? Yes  Other? No  Any new allergies since your discharge? No  Dietary orders reviewed? Yes Do you have support at home? Yes   Home Care and Equipment/Supplies: Were home health services ordered? not applicable If so, what is the name of the agency? na  Has the agency set up a time to come to the patient's home? not applicable Were any new equipment or medical supplies ordered?  No What is the name of the medical supply agency? Na  Were you able to get the supplies/equipment? not applicable Do you have any questions related to the use of the equipment or supplies? No  Functional Questionnaire: (I = Independent and D = Dependent) ADLs: I  Bathing/Dressing- I  Meal Prep- I  Eating- I  Maintaining continence- I  Transferring/Ambulation- I  Managing Meds- I  Follow up appointments reviewed:  PCP Hospital f/u appt confirmed? Yes  Scheduled to see Dr Glori Bickers on 12-08-21 @ 4pm televisit. [facility] f/u appt confirmed? Yes  Scheduled to see Dr Hilarie Fredrickson on 12-02-21 @ 230pm. Are transportation arrangements needed? No  If their condition worsens, is the pt aware to call PCP or go to the Emergency Dept.? Yes Was the patient provided with contact information for the PCP's office or ED? Yes Was to pt encouraged to call back with questions or concerns? Yes

## 2021-12-02 ENCOUNTER — Encounter: Payer: Self-pay | Admitting: Internal Medicine

## 2021-12-02 ENCOUNTER — Ambulatory Visit (AMBULATORY_SURGERY_CENTER): Payer: 59 | Admitting: Internal Medicine

## 2021-12-02 ENCOUNTER — Other Ambulatory Visit: Payer: Self-pay

## 2021-12-02 VITALS — BP 112/63 | HR 119 | Temp 99.5°F | Resp 16 | Ht 66.0 in | Wt 185.0 lb

## 2021-12-02 DIAGNOSIS — Z86010 Personal history of colonic polyps: Secondary | ICD-10-CM | POA: Diagnosis present

## 2021-12-02 DIAGNOSIS — K319 Disease of stomach and duodenum, unspecified: Secondary | ICD-10-CM | POA: Diagnosis not present

## 2021-12-02 DIAGNOSIS — R1011 Right upper quadrant pain: Secondary | ICD-10-CM | POA: Diagnosis not present

## 2021-12-02 DIAGNOSIS — Z538 Procedure and treatment not carried out for other reasons: Secondary | ICD-10-CM

## 2021-12-02 DIAGNOSIS — K269 Duodenal ulcer, unspecified as acute or chronic, without hemorrhage or perforation: Secondary | ICD-10-CM | POA: Diagnosis not present

## 2021-12-02 DIAGNOSIS — K219 Gastro-esophageal reflux disease without esophagitis: Secondary | ICD-10-CM

## 2021-12-02 DIAGNOSIS — K2970 Gastritis, unspecified, without bleeding: Secondary | ICD-10-CM | POA: Diagnosis not present

## 2021-12-02 DIAGNOSIS — K2950 Unspecified chronic gastritis without bleeding: Secondary | ICD-10-CM | POA: Diagnosis not present

## 2021-12-02 DIAGNOSIS — R112 Nausea with vomiting, unspecified: Secondary | ICD-10-CM | POA: Diagnosis not present

## 2021-12-02 DIAGNOSIS — R194 Change in bowel habit: Secondary | ICD-10-CM

## 2021-12-02 DIAGNOSIS — R12 Heartburn: Secondary | ICD-10-CM

## 2021-12-02 HISTORY — PX: COLONOSCOPY: SHX174

## 2021-12-02 MED ORDER — PLENVU 140 G PO SOLR: 1.0000 | Freq: Once | ORAL | Status: DC

## 2021-12-02 MED ORDER — PANTOPRAZOLE SODIUM 40 MG PO TBEC: 40.0000 mg | DELAYED_RELEASE_TABLET | Freq: Two times a day (BID) | ORAL | 6 refills | Status: DC

## 2021-12-02 MED ORDER — SODIUM CHLORIDE 0.9 % IV SOLN: 500.0000 mL | Freq: Once | INTRAVENOUS | Status: DC

## 2021-12-02 MED ORDER — PLENVU 140 G PO SOLR: 1.0000 | Freq: Once | ORAL | 0 refills | Status: AC

## 2021-12-02 NOTE — Progress Notes (Signed)
See office note dated 10/29/2021 for details     GASTROENTEROLOGY PROCEDURE H&P NOTE   Primary Care Physician: Tower, Wynelle Fanny, MD    Reason for Procedure:  Heartburn, nausea, vomiting, anorexia, history of colon polyps  Plan:    EGD and colonoscopy  Patient is appropriate for endoscopic procedure(s) in the ambulatory ([place]) setting.  The nature of the procedure, as well as the risks, benefits, and alternatives were carefully and thoroughly reviewed with the patient. Ample time for discussion and questions allowed. The patient understood, was satisfied, and agreed to proceed.     HPI: [name] Branch is a 65 y.o. female who presents for EGD and colonoscopy.  Medical history as below.  Is concerned that despite completing prep and also not completely clear.  No recent chest pain or shortness of breath.  Past Medical History:  Diagnosis Date   Allergic rhinitis    Arthritis    Chronic fatigue syndrome    CFIDs; also chronic joint and mucle pain (possible tik bourne illness?). Dr. Cristy Friedlander    Chronic gastritis    Dyspnea    Hepatic steatosis    History of kidney stones 05/2017   Hyperlipidemia    IBS (irritable bowel syndrome)    Lyme disease    "stari"   Migraine    Muscle twitching    Narcolepsy    Neuromuscular disorder (HCC)    PONV (postoperative nausea and vomiting)    Rectal prolapse    Serrated adenoma of colon    Umbilical hernia    Vaginal prolapse    West Nile Virus infection 2003    Past Surgical History:  Procedure Laterality Date   5th R toe fx  '09   ABDOMINAL HYSTERECTOMY     BREAST BIOPSY  '04   neg; right   CHOLECYSTECTOMY  10/2011   COLONOSCOPY  12/02/2021   2017- 2- TAs   CYSTOSCOPY WITH STENT PLACEMENT Right 05/25/2017   Procedure: CYSTOSCOPY WITH STENT PLACEMENT;  Surgeon: Hollice Espy, MD;  Location: [facility] ORS;  Service: Urology;  Laterality: Right;   CYSTOSCOPY WITH STENT PLACEMENT Right 06/21/2017   Procedure: CYSTOSCOPY WITH STENT  EXCHANGE;  Surgeon: Hollice Espy, MD;  Location: [facility] ORS;  Service: Urology;  Laterality: Right;   DIAGNOSTIC LAPAROSCOPY     EXTRACORPOREAL SHOCK WAVE LITHOTRIPSY Right 06/10/2017   Procedure: EXTRACORPOREAL SHOCK WAVE LITHOTRIPSY (ESWL);  Surgeon: Hollice Espy, MD;  Location: [facility] ORS;  Service: Urology;  Laterality: Right;   HOLMIUM LASER APPLICATION Right 50/27/7412   Procedure: HOLMIUM LASER APPLICATION;  Surgeon: Hollice Espy, MD;  Location: [facility] ORS;  Service: Urology;  Laterality: Right;   laprascopy     PARTIAL HYSTERECTOMY     bleeding   TONSILLECTOMY     TONSILLECTOMY     ULNAR NERVE REPAIR     sx times 5   UPPER GASTROINTESTINAL ENDOSCOPY  12/04/2004   gastritis   URETEROSCOPY WITH HOLMIUM LASER LITHOTRIPSY Right 06/21/2017   Procedure: URETEROSCOPY WITH HOLMIUM LASER LITHOTRIPSY;  Surgeon: Hollice Espy, MD;  Location: [facility] ORS;  Service: Urology;  Laterality: Right;    Prior to Admission medications   Medication Sig Start Date End Date Taking? Authorizing Provider  cholecalciferol (VITAMIN D) 1000 units tablet Take 5,000 Units by mouth daily.     [provider]  docusate sodium (COLACE) 100 MG capsule Take 1 capsule (100 mg total) by mouth 2 (two) times daily. 06/10/17   Hollice Espy, MD  famotidine (PEPCID) 20 MG tablet Take 1  tablet (20 mg total) by mouth 2 (two) times daily. 10/29/21   Willia Craze, NP  metoCLOPramide (REGLAN) 5 MG tablet Take 1 tablet (5 mg total) by mouth 4 (four) times daily -  before meals and at bedtime. 11/27/21 12/27/21  Loletha Grayer, MD  Milk Thistle Extract 175 MG TABS Take 1 tablet by mouth daily. 10/27/19   [provider]  morphine (MSIR) 30 MG tablet Take 30 mg by mouth every 3 (three) hours as needed for severe pain. UAD PRN    [provider]  Multiple Vitamin (MULTIVITAMIN) tablet Take 1 tablet by mouth daily.    [provider]  nitrofurantoin, macrocrystal-monohydrate, (MACROBID) 100 MG  capsule TAKE 1 CAPSULE BY MOUTH EVERY DAY 09/17/21   Tower, Wynelle Fanny, MD  ondansetron (ZOFRAN) 8 MG tablet Take 1 tablet (8 mg total) by mouth 2 (two) times daily for 10 days. 11/27/21 12/07/21  Loletha Grayer, MD  vitamin B-12 (CYANOCOBALAMIN) 1000 MCG tablet Place under the tongue.    [provider]    Current Outpatient Medications  Medication Sig Dispense Refill   cholecalciferol (VITAMIN D) 1000 units tablet Take 5,000 Units by mouth daily.      docusate sodium (COLACE) 100 MG capsule Take 1 capsule (100 mg total) by mouth 2 (two) times daily. 60 capsule 0   famotidine (PEPCID) 20 MG tablet Take 1 tablet (20 mg total) by mouth 2 (two) times daily. 60 tablet 3   metoCLOPramide (REGLAN) 5 MG tablet Take 1 tablet (5 mg total) by mouth 4 (four) times daily -  before meals and at bedtime. 120 tablet 0   Milk Thistle Extract 175 MG TABS Take 1 tablet by mouth daily.     morphine (MSIR) 30 MG tablet Take 30 mg by mouth every 3 (three) hours as needed for severe pain. UAD PRN     Multiple Vitamin (MULTIVITAMIN) tablet Take 1 tablet by mouth daily.     nitrofurantoin, macrocrystal-monohydrate, (MACROBID) 100 MG capsule TAKE 1 CAPSULE BY MOUTH EVERY DAY 30 capsule 3   ondansetron (ZOFRAN) 8 MG tablet Take 1 tablet (8 mg total) by mouth 2 (two) times daily for 10 days. 20 tablet 0   vitamin B-12 (CYANOCOBALAMIN) 1000 MCG tablet Place under the tongue.     Current Facility-Administered Medications  Medication Dose Route Frequency Provider Last Rate Last Admin   0.9 %  sodium chloride infusion  500 mL Intravenous Once Meztli Llanas, Lajuan Lines, MD        Allergies as of 12/02/2021 - Review Complete 12/02/2021  Allergen Reaction Noted   Other Nausea Only and Nausea And Vomiting 10/26/1969   Sulfonamide derivatives Nausea And Vomiting     Family History  Problem Relation Age of Onset   Diabetes Father    Hypertension Father    Stroke Father    Cancer Father    Hypertension Mother    Cancer  Mother        Waldenstroms   Ulcers Mother    Breast cancer Paternal Grandmother    Bipolar disorder Other        some family   Alcohol abuse Maternal Grandfather    Colon cancer Neg Hx    Esophageal cancer Neg Hx    Pancreatic cancer Neg Hx    Ulcerative colitis Neg Hx     Social History   Socioeconomic History   Marital status: Married    Spouse name: Not on file   Number of children: 0  Years of education: Not on file   Highest education level: Not on file  Occupational History   Occupation: owner  Tobacco Use   Smoking status: Never   Smokeless tobacco: Never   Tobacco comments:    non smoker  Vaping Use   Vaping Use: Never used  Substance and Sexual Activity   Alcohol use: No    Alcohol/week: 0.0 standard drinks   Drug use: Not Currently    Comment: history of opiate use abuse   Sexual activity: Not Currently  Other Topics Concern   Not on file  Social History Narrative   Runs a wildlife center. Married, no children.    Social Determinants of Health   Financial Resource Strain: Not on file  Food Insecurity: Not on file  Transportation Needs: Not on file  Physical Activity: Not on file  Stress: Not on file  Social Connections: Not on file  Intimate Partner Violence: Not on file    Physical Exam: Vital signs in last 24 hours: @BP  (!) 146/76    Pulse (!) 130    Temp 99.5 F (37.5 C) (Temporal)    Ht 5\' 6"  (1.676 m)    Wt 185 lb (83.9 kg)    SpO2 93%    BMI 29.86 kg/m  GEN: NAD EYE: Sclerae anicteric ENT: MMM CV: Non-tachycardic Pulm: CTA b/l GI: Soft, NT/ND NEURO:  Alert & Oriented x 3   Zenovia Jarred, MD [facility]  12/02/2021 2:27 PM

## 2021-12-02 NOTE — Op Note (Signed)
[facility] Patient Name: [name] Branch Procedure Date: 12/02/2021 2:43 PM MRN: [id number] Endoscopist: Jerene Bears , MD Age: 66 Referring MD:  Date of Birth: 11-01-1955 Gender: Female Account #: [id number] Procedure:                Upper GI endoscopy Indications:              Upper abdominal pain, Heartburn, Nausea with                            vomiting Medicines:                Monitored Anesthesia Care Procedure:                Pre-Anesthesia Assessment:                           - Prior to the procedure, a History and Physical                            was performed, and patient medications and                            allergies were reviewed. The patient's tolerance of                            previous anesthesia was also reviewed. The risks                            and benefits of the procedure and the sedation                            options and risks were discussed with the patient.                            All questions were answered, and informed consent                            was obtained. Prior Anticoagulants: The patient has                            taken no previous anticoagulant or antiplatelet                            agents. ASA Grade Assessment: III - A patient with                            severe systemic disease. After reviewing the risks                            and benefits, the patient was deemed in                            satisfactory condition to undergo the procedure.  After obtaining informed consent, the endoscope was                            passed under direct vision. Throughout the                            procedure, the patient's blood pressure, pulse, and                            oxygen saturations were monitored continuously. The                            GIF [id number] #[id number] was introduced through the                            mouth, and advanced to the second part of duodenum.                             The upper GI endoscopy was accomplished without                            difficulty. The patient tolerated the procedure                            well. Scope In: Scope Out: Findings:                 Diffuse mild inflammation characterized by                            congestion (edema) and granularity was found in the                            middle third of the esophagus and in the lower                            third of the esophagus. Biopsies were taken with a                            cold forceps for histology.                           A low-grade of narrowing Schatzki ring was found at                            the gastroesophageal junction. During the endoscopy                            the natural movement of the scope caused                            superficial rents in the ring in 2 locations.  Moderate inflammation characterized by adherent                            blood, erosions, erythema and granularity was found                            in the gastric antrum. Biopsies were taken with a                            cold forceps for histology and Helicobacter pylori                            testing.                           Two non-bleeding superficial duodenal ulcers were                            found in the duodenal bulb.                           2 medium diverticula were found in the second                            portion of the duodenum (pantaloon).                           The second portion of the duodenum was normal.                            Biopsies were taken with a cold forceps for                            histology. Complications:            No immediate complications. Estimated Blood Loss:     Estimated blood loss was minimal. Impression:               - Esophageal mucosal changes were present,                            including congestion (edema) and granularity.                             Findings are suggestive of reflux inflammation.                            Biopsied.                           - Low-grade of narrowing Schatzki ring.                           - Gastritis. Biopsied.                           - Non-bleeding duodenal ulcers.                           -  Duodenal diverticula.                           - Normal second portion of the duodenum. Biopsied. Recommendation:           - Patient has a contact number available for                            emergencies. The signs and symptoms of potential                            delayed complications were discussed with the                            patient. Return to normal activities tomorrow.                            Written discharge instructions were provided to the                            patient.                           - Resume previous diet.                           - Continue present medications. Avoid NSAIDs.                           - Discontinue famotidine and begin pantoprazole 40                            mg twice daily (best taken 30 min before 1st and                            last meal of the day).                           - Await pathology results.                           - Abdominal US with elastography.                           - Repeat colonoscopy with 2 day prep.                           - Office follow-up with me. Jerene Bears, MD 12/02/2021 3:12:08 PM This report has been signed electronically.

## 2021-12-02 NOTE — Progress Notes (Signed)
To PACU, VSS. Report to Rn.tb 

## 2021-12-02 NOTE — Telephone Encounter (Signed)
Per PCP she is okay with a virtual visit, which her 4pm appt should always be virtual. Appt changed and sent mychart letting pt know

## 2021-12-02 NOTE — Op Note (Signed)
[facility] Patient Name: [name] Branch Procedure Date: 12/02/2021 2:43 PM MRN: [id number] Endoscopist: Jerene Bears , MD Age: 66 Referring MD:  Date of Birth: [birth year] Gender: Female Account #: [id number] Procedure:                Colonoscopy Indications:              High risk colon cancer surveillance: Personal                            history of sessile serrated colon polyp (10 mm or                            greater in size) and non-advanced adenomas, Last                            colonoscopy: July 2017 Medicines:                Monitored Anesthesia Care Procedure:                Pre-Anesthesia Assessment:                           - Prior to the procedure, a History and Physical                            was performed, and patient medications and                            allergies were reviewed. The patient's tolerance of                            previous anesthesia was also reviewed. The risks                            and benefits of the procedure and the sedation                            options and risks were discussed with the patient.                            All questions were answered, and informed consent                            was obtained. Prior Anticoagulants: The patient has                            taken no previous anticoagulant or antiplatelet                            agents. ASA Grade Assessment: III - A patient with                            severe systemic disease. After reviewing the risks  and benefits, the patient was deemed in                            satisfactory condition to undergo the procedure.                           After obtaining informed consent, the colonoscope                            was passed under direct vision. Throughout the                            procedure, the patient's blood pressure, pulse, and                            oxygen saturations were monitored  continuously. The                            CF [id number] #[id number] was introduced through the anus                            with the intention of advancing to the cecum. The                            scope was advanced to the descending colon before                            the procedure was aborted. Medications were given.                            The colonoscopy was performed without difficulty.                            The patient tolerated the procedure well. The                            quality of the bowel preparation was                            unsatisfactory. The rectum was photographed. Scope In: Scope Out: Findings:                 The digital rectal exam was normal.                           Solid stool was found in the rectum and in the                            sigmoid colon, interfering with visualization. Complications:            No immediate complications. Estimated Blood Loss:     Estimated blood loss: none. Impression:               - Preparation of the colon was unsatisfactory.                           -  Stool in the rectum and in the sigmoid colon.                           - Exam aborted due to prep.                           - No specimens collected. Recommendation:           - Patient has a contact number available for                            emergencies. The signs and symptoms of potential                            delayed complications were discussed with the                            patient. Return to normal activities tomorrow.                            Written discharge instructions were provided to the                            patient.                           - Resume previous diet.                           - Continue present medications.                           - Repeat colonoscopy at appointment to be scheduled                            for surveillance with 2 day bowel prep. Jerene Bears, MD 12/02/2021 3:14:43 PM This report  has been signed electronically.

## 2021-12-02 NOTE — Patient Instructions (Addendum)
Start Pantoprazole 40 mg twice daily. ( Best take 30 min before 1st and last meal of the day.  Await pathology results.  Avoid NSAIDs.   Repeat colonoscopy with 2 day prep. See below for appointment day and time.  Abdominal US with elastography. Office will call you with appointment date and time.  YOU HAD AN ENDOSCOPIC PROCEDURE TODAY AT [facility]:   Refer to the procedure report that was given to you for any specific questions about what was found during the examination.  If the procedure report does not answer your questions, please call your gastroenterologist to clarify.  If you requested that your care partner not be given the details of your procedure findings, then the procedure report has been included in a sealed envelope for you to review at your convenience later.  YOU SHOULD EXPECT: Some feelings of bloating in the abdomen. Passage of more gas than usual.  Walking can help get rid of the air that was put into your GI tract during the procedure and reduce the bloating. If you had a lower endoscopy (such as a colonoscopy or flexible sigmoidoscopy) you may notice spotting of blood in your stool or on the toilet paper. If you underwent a bowel prep for your procedure, you may not have a normal bowel movement for a few days.  Please Note:  You might notice some irritation and congestion in your nose or some drainage.  This is from the oxygen used during your procedure.  There is no need for concern and it should clear up in a day or so.  SYMPTOMS TO REPORT IMMEDIATELY:  Following lower endoscopy (colonoscopy or flexible sigmoidoscopy):  Excessive amounts of blood in the stool  Significant tenderness or worsening of abdominal pains  Swelling of the abdomen that is new, acute  Fever of 100F or higher  Following upper endoscopy (EGD)  Vomiting of blood or coffee ground material  New chest pain or pain under the shoulder blades  Painful or persistently difficult  swallowing  New shortness of breath  Fever of 100F or higher  Black, tarry-looking stools  For urgent or emergent issues, a gastroenterologist can be reached at any hour by calling [phone]. Do not use MyChart messaging for urgent concerns.    DIET:  We do recommend a small meal at first, but then you may proceed to your regular diet.  Drink plenty of fluids but you should avoid alcoholic beverages for 24 hours.  ACTIVITY:  You should plan to take it easy for the rest of today and you should NOT DRIVE or use heavy machinery until tomorrow (because of the sedation medicines used during the test).    FOLLOW UP: Our staff will call the number listed on your records 48-72 hours following your procedure to check on you and address any questions or concerns that you may have regarding the information given to you following your procedure. If we do not reach you, we will leave a message.  We will attempt to reach you two times.  During this call, we will ask if you have developed any symptoms of COVID 19. If you develop any symptoms (ie: fever, flu-like symptoms, shortness of breath, cough etc.) before then, please call [phone].  If you test positive for Covid 19 in the 2 weeks post procedure, please call and report this information to [place].    If any biopsies were taken you will be contacted by phone or by letter within the next  1-3 weeks.  Please call us at [phone] if you have not heard about the biopsies in 3 weeks.    SIGNATURES/CONFIDENTIALITY: You and/or your care partner have signed paperwork which will be entered into your electronic medical record.  These signatures attest to the fact that that the information above on your After Visit Summary has been reviewed and is understood.  Full responsibility of the confidentiality of this discharge information lies with you and/or your care-partner.

## 2021-12-02 NOTE — Progress Notes (Signed)
States ER last week, severely dehydrated, given fluids and went home with reglan .  VS DT

## 2021-12-03 ENCOUNTER — Encounter: Payer: Self-pay | Admitting: Internal Medicine

## 2021-12-03 ENCOUNTER — Other Ambulatory Visit: Payer: Self-pay

## 2021-12-03 DIAGNOSIS — K760 Fatty (change of) liver, not elsewhere classified: Secondary | ICD-10-CM

## 2021-12-03 MED ORDER — PANTOPRAZOLE SODIUM 40 MG PO TBEC: 40.0000 mg | DELAYED_RELEASE_TABLET | Freq: Two times a day (BID) | ORAL | 11 refills | Status: DC

## 2021-12-04 ENCOUNTER — Telehealth: Payer: Self-pay

## 2021-12-04 NOTE — Telephone Encounter (Signed)
°  Follow up Call-  Call back number 12/02/2021  Post procedure Call Back phone  # [phone]  Permission to leave phone message Yes  Some recent data might be hidden     Patient questions:  Do you have a fever, pain , or abdominal swelling? No. Pain Score  0 *  Have you tolerated food without any problems? Yes.    Have you been able to return to your normal activities? Yes.    Do you have any questions about your discharge instructions: Diet   No. Medications  No. Follow up visit  No.  Do you have questions or concerns about your Care? No.  Actions: * If pain score is 4 or above: No action needed, pain <4.

## 2021-12-05 ENCOUNTER — Encounter: Payer: Self-pay | Admitting: Internal Medicine

## 2021-12-08 ENCOUNTER — Telehealth: Payer: 59 | Admitting: Family Medicine

## 2021-12-08 ENCOUNTER — Other Ambulatory Visit: Payer: Self-pay

## 2021-12-09 ENCOUNTER — Encounter: Payer: Self-pay | Admitting: Internal Medicine

## 2021-12-15 ENCOUNTER — Encounter: Payer: Self-pay | Admitting: Internal Medicine

## 2021-12-15 NOTE — Telephone Encounter (Signed)
MyChart message reviewed Recent EGD  Please ensure she is using the pantoprazole 40 mg twice daily AC She has prescription for metoclopramide, is she using this.  Is it helping with nausea? Okay for promethazine 12.5 mg; 1 to 2 tablets up to 3 times a day as needed for refractory nausea.  She should be made aware that this can cause sedation and she should not operate machines or vehicles while using this medication

## 2021-12-16 ENCOUNTER — Other Ambulatory Visit: Payer: Self-pay

## 2021-12-16 MED ORDER — PROMETHAZINE HCL 12.5 MG PO TABS: ORAL_TABLET | ORAL | 3 refills | Status: DC

## 2021-12-25 ENCOUNTER — Other Ambulatory Visit: Payer: Self-pay

## 2021-12-25 ENCOUNTER — Ambulatory Visit
Admission: RE | Admit: 2021-12-25 | Discharge: 2021-12-25 | Disposition: A | Discharge status: Home / Self Care | Payer: 59 | Source: Ambulatory Visit | Attending: Internal Medicine | Admitting: Internal Medicine

## 2021-12-25 DIAGNOSIS — K760 Fatty (change of) liver, not elsewhere classified: Secondary | ICD-10-CM | POA: Insufficient documentation

## 2021-12-30 ENCOUNTER — Encounter: Payer: Self-pay | Admitting: Internal Medicine

## 2022-01-01 ENCOUNTER — Encounter: Payer: 59 | Admitting: Internal Medicine

## 2022-01-06 ENCOUNTER — Telehealth: Payer: Medicare Other | Admitting: Family Medicine

## 2022-01-16 ENCOUNTER — Encounter: Payer: Self-pay | Admitting: Family Medicine

## 2022-01-16 ENCOUNTER — Other Ambulatory Visit: Payer: Self-pay | Admitting: Family Medicine

## 2022-01-16 NOTE — Telephone Encounter (Signed)
Will route to PCP for review, not sure if med should be filled or not but pt did send an update of sxs via mychart  ?

## 2022-01-19 ENCOUNTER — Encounter: Payer: Self-pay | Admitting: Internal Medicine

## 2022-01-19 NOTE — Telephone Encounter (Signed)
Yes okay to use promethazine at previously prescribed process for nausea/vomiting and also including during her bowel preparation process for recovery colonoscopy ?

## 2022-01-20 ENCOUNTER — Ambulatory Visit: Payer: Medicare Other | Admitting: Internal Medicine

## 2022-01-22 ENCOUNTER — Encounter: Payer: Self-pay | Admitting: Internal Medicine

## 2022-01-22 ENCOUNTER — Ambulatory Visit (AMBULATORY_SURGERY_CENTER): Payer: 59 | Admitting: Internal Medicine

## 2022-01-22 VITALS — BP 109/65 | HR 92 | Temp 99.5°F | Resp 18 | Wt 185.0 lb

## 2022-01-22 DIAGNOSIS — D120 Benign neoplasm of cecum: Secondary | ICD-10-CM | POA: Diagnosis not present

## 2022-01-22 DIAGNOSIS — D123 Benign neoplasm of transverse colon: Secondary | ICD-10-CM | POA: Diagnosis not present

## 2022-01-22 DIAGNOSIS — D122 Benign neoplasm of ascending colon: Secondary | ICD-10-CM

## 2022-01-22 DIAGNOSIS — Z86010 Personal history of colonic polyps: Secondary | ICD-10-CM

## 2022-01-22 DIAGNOSIS — K635 Polyp of colon: Secondary | ICD-10-CM | POA: Diagnosis not present

## 2022-01-22 MED ORDER — SODIUM CHLORIDE 0.9 % IV SOLN: 500.0000 mL | Freq: Once | INTRAVENOUS | Status: DC

## 2022-01-22 NOTE — Progress Notes (Signed)
Updated pt medical hx ?

## 2022-01-22 NOTE — Progress Notes (Signed)
PT taken to PACU. Monitors in place. VSS. Report given to RN. 

## 2022-01-22 NOTE — Progress Notes (Signed)
? ?GASTROENTEROLOGY PROCEDURE H&P NOTE  ? ?Primary Care Physician: ?Tower, Wynelle Fanny, MD ? ? ? ?Reason for Procedure:  Personal history of adenomatous and sessile serrated colon polyps including those greater than 1 cm, poor preparation and attempted colonoscopy in February of this year ? ?Plan:    Surveillance colonoscopy ? ?Patient is appropriate for endoscopic procedure(s) in the ambulatory ([facility]) setting. ? ?The nature of the procedure, as well as the risks, benefits, and alternatives were carefully and thoroughly reviewed with the patient. Ample time for discussion and questions allowed. The patient understood, was satisfied, and agreed to proceed.  ? ? ? ?HPI: ?[name] Branch is a 66 y.o. female who presents for surveillance colonoscopy.  Medical history as below.  Tolerated the prep.  No recent chest pain or shortness of breath.  No abdominal pain today. ? ?Past Medical History:  ?Diagnosis Date  ? Allergic rhinitis   ? Arthritis   ? Chronic fatigue syndrome   ? 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 Dr. Cristy Friedlander   ? Chronic gastritis   ? Dyspnea   ? Hepatic steatosis   ? History of kidney stones 05/2017  ? Hyperlipidemia   ? IBS (irritable bowel syndrome)   ? Lyme disease   ? "stari"  ? Migraine   ? Muscle twitching   ? Narcolepsy   ? Neuromuscular disorder ([place])   ? PONV (postoperative nausea and vomiting)   ? Rectal prolapse   ? Serrated adenoma of colon   ? Umbilical hernia   ? Vaginal prolapse   ? West Nile Virus infection 2003  ? ? ?Past Surgical History:  ?Procedure Laterality Date  ? 5th R toe fx  '09  ? ABDOMINAL HYSTERECTOMY    ? BREAST BIOPSY  '04  ? neg; right  ? CHOLECYSTECTOMY  10/2011  ? COLONOSCOPY  12/02/2021  ? 2017- 2- TAs  ? CYSTOSCOPY WITH STENT PLACEMENT Right 05/25/2017  ? Procedure: CYSTOSCOPY WITH STENT PLACEMENT;  Surgeon: Hollice Espy, MD;  Location: [facility];  Service: Urology;  Laterality: Right;  ? CYSTOSCOPY WITH STENT PLACEMENT Right  06/21/2017  ? Procedure: CYSTOSCOPY WITH STENT EXCHANGE;  Surgeon: Hollice Espy, MD;  Location: [facility];  Service: Urology;  Laterality: Right;  ? DIAGNOSTIC LAPAROSCOPY    ? EXTRACORPOREAL SHOCK WAVE LITHOTRIPSY Right 06/10/2017  ? Procedure: EXTRACORPOREAL SHOCK WAVE LITHOTRIPSY (ESWL);  Surgeon: Hollice Espy, MD;  Location: [facility];  Service: Urology;  Laterality: Right;  ? HOLMIUM LASER APPLICATION Right 32/95/1884  ? Procedure: HOLMIUM LASER APPLICATION;  Surgeon: Hollice Espy, MD;  Location: [facility];  Service: Urology;  Laterality: Right;  ? laprascopy    ? PARTIAL HYSTERECTOMY    ? bleeding  ? TONSILLECTOMY    ? TONSILLECTOMY    ? ULNAR NERVE REPAIR    ? sx times 5  ? UPPER GASTROINTESTINAL ENDOSCOPY  12/04/2004  ? gastritis  ? URETEROSCOPY WITH HOLMIUM LASER LITHOTRIPSY Right 06/21/2017  ? Procedure: URETEROSCOPY WITH HOLMIUM LASER LITHOTRIPSY;  Surgeon: Hollice Espy, MD;  Location: [facility];  Service: Urology;  Laterality: Right;  ? ? ?Prior to Admission medications   ?Medication Sig Start Date End Date Taking? Authorizing Provider  ?cholecalciferol (VITAMIN D) 1000 units tablet Take 5,000 Units by mouth daily.    Yes [provider]  ?docusate sodium (COLACE) 100 MG capsule Take 1 capsule (100 mg total) by mouth 2 (two) times daily. 06/10/17  Yes Hollice Espy, MD  ?Milk Thistle Extract 175 MG  TABS Take 1 tablet by mouth daily. 10/27/19  Yes [provider]  ?morphine (MSIR) 30 MG tablet Take 30 mg by mouth every 3 (three) hours as needed for severe pain. UAD PRN   Yes [provider]  ?Multiple Vitamin (MULTIVITAMIN) tablet Take 1 tablet by mouth daily.   Yes [provider]  ?nitrofurantoin, macrocrystal-monohydrate, (MACROBID) 100 MG capsule TAKE 1 CAPSULE BY MOUTH EVERY DAY 01/16/22  Yes Tower, Marne A, MD  ?ondansetron (ZOFRAN) 8 MG tablet TAKE 1 TABLET BY MOUTH EVERY 8 HOURS AS NEEDED FOR NAUSEA AND VOMITING 01/16/22  Yes Tower, Wynelle Fanny, MD   ?pantoprazole (PROTONIX) 40 MG tablet Take 1 tablet (40 mg total) by mouth 2 (two) times daily before a meal. 12/03/21  Yes Palin Tristan, Lajuan Lines, MD  ?promethazine (PHENERGAN) 12.5 MG tablet Take 1-2 tabs 3 times a day as needed for nausea 12/16/21  Yes Skye Plamondon, Lajuan Lines, MD  ?vitamin B-12 (CYANOCOBALAMIN) 1000 MCG tablet Place under the tongue.   Yes [provider]  ? ? ?Current Outpatient Medications  ?Medication Sig Dispense Refill  ? cholecalciferol (VITAMIN D) 1000 units tablet Take 5,000 Units by mouth daily.     ? docusate sodium (COLACE) 100 MG capsule Take 1 capsule (100 mg total) by mouth 2 (two) times daily. 60 capsule 0  ? Milk Thistle Extract 175 MG TABS Take 1 tablet by mouth daily.    ? morphine (MSIR) 30 MG tablet Take 30 mg by mouth every 3 (three) hours as needed for severe pain. UAD PRN    ? Multiple Vitamin (MULTIVITAMIN) tablet Take 1 tablet by mouth daily.    ? nitrofurantoin, macrocrystal-monohydrate, (MACROBID) 100 MG capsule TAKE 1 CAPSULE BY MOUTH EVERY DAY 30 capsule 3  ? ondansetron (ZOFRAN) 8 MG tablet TAKE 1 TABLET BY MOUTH EVERY 8 HOURS AS NEEDED FOR NAUSEA AND VOMITING 30 tablet 3  ? pantoprazole (PROTONIX) 40 MG tablet Take 1 tablet (40 mg total) by mouth 2 (two) times daily before a meal. 60 tablet 11  ? promethazine (PHENERGAN) 12.5 MG tablet Take 1-2 tabs 3 times a day as needed for nausea 30 tablet 3  ? vitamin B-12 (CYANOCOBALAMIN) 1000 MCG tablet Place under the tongue.    ? ?Current Facility-Administered Medications  ?Medication Dose Route Frequency Provider Last Rate Last Admin  ? 0.9 %  sodium chloride infusion  500 mL Intravenous Once Jayland Null, Lajuan Lines, MD      ? ? ?Allergies as of 01/22/2022 - Review Complete 01/22/2022  ?Allergen Reaction Noted  ? Other Nausea Only and Nausea And Vomiting 10/26/1969  ? Sulfonamide derivatives Nausea And Vomiting   ? ? ?Family History  ?Problem Relation Age of Onset  ? Diabetes Father   ? Hypertension Father   ? Stroke Father   ? Cancer Father    ? Hypertension Mother   ? Cancer Mother   ?     Waldenstroms  ? Ulcers Mother   ? Breast cancer Paternal Grandmother   ? Bipolar disorder Other   ?     some family  ? Alcohol abuse Maternal Grandfather   ? Colon cancer Neg Hx   ? Esophageal cancer Neg Hx   ? Pancreatic cancer Neg Hx   ? Ulcerative colitis Neg Hx   ? ? ?Social History  ? ?Socioeconomic History  ? Marital status: Married  ?  Spouse name: Not on file  ? Number of children: 0  ? Years of education: Not on file  ?  Highest education level: Not on file  ?Occupational History  ? Occupation: owner  ?Tobacco Use  ? Smoking status: Never  ? Smokeless tobacco: Never  ? Tobacco comments:  ?  non smoker  ?Vaping Use  ? Vaping Use: Never used  ?Substance and Sexual Activity  ? Alcohol use: No  ?  Alcohol/week: 0.0 standard drinks  ? Drug use: Not Currently  ?  Comment: history of opiate use abuse  ? Sexual activity: Not Currently  ?Other Topics Concern  ? Not on file  ?Social History Narrative  ? 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 Married, no children.   ? ?Social Determinants of Health  ? ?Financial Resource Strain: Not on file  ?Food Insecurity: Not on file  ?Transportation Needs: Not on file  ?Physical Activity: Not on file  ?Stress: Not on file  ?Social Connections: Not on file  ?Intimate Partner Violence: Not on file  ? ? ?Physical Exam: ?Vital signs in last 24 hours: ?'@BP'$  (!) 144/74   Pulse (!) 122   Temp 99.5 ?F (37.5 ?C) (Temporal)   Wt 185 lb (83.9 kg)   SpO2 95%   BMI 29.86 kg/m?  ?GEN: NAD ?EYE: Sclerae anicteric ?ENT: MMM ?CV: Non-tachycardic ?Pulm: CTA b/l ?GI: Soft, NT/ND ?NEURO:  Alert & Oriented x 3 ? ? ?Zenovia Jarred, MD ?[facility] ? ?01/22/2022 2:51 PM ? ?

## 2022-01-22 NOTE — Op Note (Signed)
[place] ?Patient Name: [name] Branch ?Procedure Date: 01/22/2022 2:47 PM ?MRN: [id number] ?Endoscopist: Jerene Bears , MD ?Age: 66 ?Referring MD:  ?Date of Birth: [birth year] ?Gender: Female ?Account #: [id number] ?Procedure:                Colonoscopy ?Indications:              High risk colon cancer surveillance: Personal  ?                          history of sessile serrated colon polyp (10 mm or  ?                          greater in size) removed in 2016; last colonoscopy  ?                          2017 with 2 adenomas removed; poor prep at  ?                          attempted colonoscopy 11/2021 ?Medicines:                Monitored Anesthesia Care ?Procedure:                Pre-Anesthesia Assessment: ?                          - Prior to the procedure, a History and Physical  ?                          was performed, and patient medications and  ?                          allergies were reviewed. The patient's tolerance of  ?                          previous anesthesia was also reviewed. The risks  ?                          and benefits of the procedure and the sedation  ?                          options and risks were discussed with the patient.  ?                          All questions were answered, and informed consent  ?                          was obtained. Prior Anticoagulants: The patient has  ?                          taken no previous anticoagulant or antiplatelet  ?                          agents. ASA Grade Assessment: III - A patient with  ?  severe systemic disease. After reviewing the risks  ?                          and benefits, the patient was deemed in  ?                          satisfactory condition to undergo the procedure. ?                          After obtaining informed consent, the colonoscope  ?                          was passed under direct vision. Throughout the  ?                          procedure, the patient's blood pressure,  pulse, and  ?                          oxygen saturations were monitored continuously. The  ?                          Olympus CF-HQ190L (Serial# [id number]) Colonoscope was  ?                          introduced through the anus and advanced to the  ?                          cecum, identified by appendiceal orifice and  ?                          ileocecal valve. The colonoscopy was somewhat  ?                          difficult due to a redundant colon. The patient  ?                          tolerated the procedure well. The quality of the  ?                          bowel preparation was good (2 day prep). The  ?                          ileocecal valve, appendiceal orifice, and rectum  ?                          were photographed. ?Scope In: 3:04:21 PM ?Scope Out: 3:33:52 PM ?Scope Withdrawal Time: 0 hours 18 minutes 21 seconds  ?Total Procedure Duration: 0 hours 29 minutes 31 seconds  ?Findings:                 The digital rectal exam was normal. ?                          Two sessile polyps were found in the cecum. The  ?  polyps were 4 to 6 mm in size. These polyps were  ?                          removed with a cold snare. Resection and retrieval  ?                          were complete. ?                          A 5 mm polyp was found in the ascending colon. The  ?                          polyp was sessile. The polyp was removed with a  ?                          cold snare. Resection and retrieval were complete. ?                          A 2 mm polyp was found in the ascending colon. The  ?                          polyp was sessile. The polyp was removed with a  ?                          cold biopsy forceps. Resection and retrieval were  ?                          complete. ?                          A tattoo was seen in the ascending colon. A  ?                          post-polypectomy scar was found at the tattoo site.  ?                          There was no evidence of  residual polyp tissue. ?                          A 4 mm polyp was found in the transverse colon. The  ?                          polyp was sessile. The polyp was removed with a  ?                          cold snare. Resection and retrieval were complete. ?                          Retroflexion in the rectum was not performed due  ?                          narrow rectal vault. No abnormalities identified in  ?  the rectum or anal canal. ?Complications:            No immediate complications. ?Estimated Blood Loss:     Estimated blood loss was minimal. ?Impression:               - Two 4 to 6 mm polyps in the cecum, removed with a  ?                          cold snare. Resected and retrieved. ?                          - One 5 mm polyp in the ascending colon, removed  ?                          with a cold snare. Resected and retrieved. ?                          - One 2 mm polyp in the ascending colon, removed  ?                          with a cold biopsy forceps. Resected and retrieved. ?                          - A tattoo was seen in the ascending colon. A  ?                          post-polypectomy scar was found at the tattoo site.  ?                          There was no evidence of residual polyp tissue. ?                          - One 4 mm polyp in the transverse colon, removed  ?                          with a cold snare. Resected and retrieved. ?Recommendation:           - Patient has a contact number available for  ?                          emergencies. The signs and symptoms of potential  ?                          delayed complications were discussed with the  ?                          patient. Return to normal activities tomorrow.  ?                          Written discharge instructions were provided to the  ?                          patient. ?                          -  Resume previous diet. ?                          - Continue present medications. ?                           - Await pathology results. ?                          - Repeat colonoscopy is recommended for  ?                          surveillance. The colonoscopy date will be  ?                          determined after pathology results from today's  ?                          exam become available for review. ?Jerene Bears, MD ?01/22/2022 3:41:52 PM ?This report has been signed electronically. ?

## 2022-01-22 NOTE — Patient Instructions (Signed)
Handout given for polyps.  Resume previous diet and continue present medications.  Repeat colonoscopy recommended for surveillance. The colonoscopy date will be determined after pathology results from today's exam become available. ? ?YOU HAD AN ENDOSCOPIC PROCEDURE TODAY AT [facility]:   Refer to the procedure report that was given to you for any specific questions about what was found during the examination.  If the procedure report does not answer your questions, please call your gastroenterologist to clarify.  If you requested that your care partner not be given the details of your procedure findings, then the procedure report has been included in a sealed envelope for you to review at your convenience later. ? ?YOU SHOULD EXPECT: Some feelings of bloating in the abdomen. Passage of more gas than usual.  Walking can help get rid of the air that was put into your GI tract during the procedure and reduce the bloating. If you had a lower endoscopy (such as a colonoscopy or flexible sigmoidoscopy) you may notice spotting of blood in your stool or on the toilet paper. If you underwent a bowel prep for your procedure, you may not have a normal bowel movement for a few days. ? ?Please Note:  You might notice some irritation and congestion in your nose or some drainage.  This is from the oxygen used during your procedure.  There is no need for concern and it should clear up in a day or so. ? ?SYMPTOMS TO REPORT IMMEDIATELY: ? ?Following lower endoscopy (colonoscopy or flexible sigmoidoscopy): ? Excessive amounts of blood in the stool ? Significant tenderness or worsening of abdominal pains ? Swelling of the abdomen that is new, acute ? Fever of 100?F or higher ? ?For urgent or emergent issues, a gastroenterologist can be reached at any hour by calling [phone]. ?Do not use MyChart messaging for urgent concerns.  ? ? ?DIET:  We do recommend a small meal at first, but then you may proceed to your  regular diet.  Drink plenty of fluids but you should avoid alcoholic beverages for 24 hours. ? ?ACTIVITY:  You should plan to take it easy for the rest of today and you should NOT DRIVE or use heavy machinery until tomorrow (because of the sedation medicines used during the test).   ? ?FOLLOW UP: ?Our staff will call the number listed on your records 48-72 hours following your procedure to check on you and address any questions or concerns that you may have regarding the information given to you following your procedure. If we do not reach you, we will leave a message.  We will attempt to reach you two times.  During this call, we will ask if you have developed any symptoms of COVID 19. If you develop any symptoms (ie: fever, flu-like symptoms, shortness of breath, cough etc.) before then, please call [phone].  If you test positive for Covid 19 in the 2 weeks post procedure, please call and report this information to [place].   ? ?If any biopsies were taken you will be contacted by phone or by letter within the next 1-3 weeks.  Please call us at [phone] if you have not heard about the biopsies in 3 weeks.  ? ? ?SIGNATURES/CONFIDENTIALITY: ?You and/or your care partner have signed paperwork which will be entered into your electronic medical record.  These signatures attest to the fact that that the information above on your After Visit Summary has been reviewed and is understood.  Full responsibility  of the confidentiality of this discharge information lies with you and/or your care-partner.  ?

## 2022-01-26 ENCOUNTER — Telehealth: Payer: Self-pay

## 2022-01-26 NOTE — Telephone Encounter (Signed)
?  Follow up Call- ? ? ?  01/22/2022  ?  2:36 PM 12/02/2021  ?  1:54 PM  ?Call back number  ?Post procedure Call Back phone  # [phone]  ?Permission to leave phone message Yes Yes  ?  ? ?Patient questions: ? ?Do you have a fever, pain , or abdominal swelling? No. ?Pain Score  0 * ? ?Have you tolerated food without any problems? Yes.   ? ?Have you been able to return to your normal activities? Yes.   ? ?Do you have any questions about your discharge instructions: ?Diet   No. ?Medications  No. ?Follow up visit  No. ? ?Do you have questions or concerns about your Care? No. ? ?Actions: ?* If pain score is 4 or above: ?No action needed, pain <4. ? ? ?

## 2022-01-26 NOTE — Telephone Encounter (Signed)
?  Follow up Call- ? ? ?  01/22/2022  ?  2:36 PM 12/02/2021  ?  1:54 PM  ?Call back number  ?Post procedure Call Back phone  # [phone]  ?Permission to leave phone message Yes Yes  ?  ? ?Left message ?

## 2022-01-28 ENCOUNTER — Encounter: Payer: Self-pay | Admitting: Internal Medicine

## 2022-01-28 ENCOUNTER — Telehealth: Payer: Self-pay | Admitting: Internal Medicine

## 2022-01-28 NOTE — Telephone Encounter (Signed)
Inbound call from patient husband. Patient had procedure 3/30 and have had normal bowel movements since then. Patient husband reports this morning 4/5, she had a bowel movement with bright red blood.  ?

## 2022-01-28 NOTE — Telephone Encounter (Signed)
Agree. Thanks

## 2022-01-28 NOTE — Telephone Encounter (Signed)
Husband states the pt had a BM this am and he reports there was about 1cc of BRB on the tissue. Discussed with him that if the bleeding got worse or she started passing dark maroon colored blood she would need to go to the ER. She has not passed anymore so far, instructed them to monitor for any more bleeding. Dr. Hilarie Fredrickson notified. ?

## 2022-01-30 ENCOUNTER — Encounter: Payer: Self-pay | Admitting: Internal Medicine

## 2022-02-04 ENCOUNTER — Telehealth: Payer: Medicare Other | Admitting: Family Medicine

## 2022-02-09 ENCOUNTER — Encounter: Payer: Self-pay | Admitting: Family Medicine

## 2022-02-10 ENCOUNTER — Encounter: Payer: Self-pay | Admitting: Family Medicine

## 2022-02-10 ENCOUNTER — Telehealth (INDEPENDENT_AMBULATORY_CARE_PROVIDER_SITE_OTHER): Payer: 59 | Admitting: Family Medicine

## 2022-02-10 VITALS — BP 132/68 | Temp 98.2°F

## 2022-02-10 DIAGNOSIS — Z1231 Encounter for screening mammogram for malignant neoplasm of breast: Secondary | ICD-10-CM | POA: Diagnosis not present

## 2022-02-10 DIAGNOSIS — K2950 Unspecified chronic gastritis without bleeding: Secondary | ICD-10-CM

## 2022-02-10 DIAGNOSIS — R748 Abnormal levels of other serum enzymes: Secondary | ICD-10-CM

## 2022-02-10 DIAGNOSIS — K2970 Gastritis, unspecified, without bleeding: Secondary | ICD-10-CM | POA: Insufficient documentation

## 2022-02-10 DIAGNOSIS — N8110 Cystocele, unspecified: Secondary | ICD-10-CM | POA: Diagnosis not present

## 2022-02-10 DIAGNOSIS — N3946 Mixed incontinence: Secondary | ICD-10-CM | POA: Diagnosis not present

## 2022-02-10 DIAGNOSIS — Z4689 Encounter for fitting and adjustment of other specified devices: Secondary | ICD-10-CM | POA: Insufficient documentation

## 2022-02-10 DIAGNOSIS — K589 Irritable bowel syndrome without diarrhea: Secondary | ICD-10-CM

## 2022-02-10 DIAGNOSIS — R1110 Vomiting, unspecified: Secondary | ICD-10-CM

## 2022-02-10 DIAGNOSIS — L304 Erythema intertrigo: Secondary | ICD-10-CM

## 2022-02-10 DIAGNOSIS — Z1211 Encounter for screening for malignant neoplasm of colon: Secondary | ICD-10-CM

## 2022-02-10 DIAGNOSIS — K5909 Other constipation: Secondary | ICD-10-CM

## 2022-02-10 MED ORDER — KETOCONAZOLE 2 % EX CREA: 1.0000 "application " | TOPICAL_CREAM | Freq: Every day | CUTANEOUS | 1 refills | Status: DC

## 2022-02-10 NOTE — Patient Instructions (Addendum)
Call the [facility] breast center for your mammogram (I put the order in) ? ?Please call the location of your choice from the menu below to schedule your Mammogram and/or Bone Density appointment.   ? ? ?[place] ? ?[place] [facility]   ?Phone:  [phone]   ?[place], [place] [address]                                            ?Services: 3D Mammogram and Bone Density ? ? ? ?Try the ketoconazole cream on your rash  ?Keep the area clean and dry  ? ?I placed a gyn referral  ?If you don't get a call in 2 weeks let us know  ? ?Follow up with GI as planned  ? ? ?

## 2022-02-10 NOTE — Assessment & Plan Note (Signed)
Seen on recent EGD  ?Taking bid protonix 40 mg  ?

## 2022-02-10 NOTE — Progress Notes (Signed)
Virtual Visit via Video Note ? ?I connected with [name] Branch on 02/10/22 at  4:00 PM EDT by a video enabled telemedicine application and verified that I am speaking with the correct person using two identifiers. ? ?Location: ?Patient: home  ?Provider: office  ?  ?I discussed the limitations of evaluation and management by telemedicine and the availability of in person appointments. The patient expressed understanding and agreed to proceed. ? ?Parties involved in encounter ? ?Patient: [name] Branch  ? ?Provider:  Loura Pardon MD  ? ?Virtual failed today and visit was done by phone  ? ?History of Present Illness: ?Pt presents to disc/review GI issues and gyn problems and breast cancer screening and to discuss skin infections  ? ?Sees Dr Hilarie Fredrickson  ?Had colonoscopy-polyps (hyperplastic and adenoma) ?Redundant colon, had 2 preps , 5 y recall  ?Had some bleeding on and off  ?EGD-gastritis and duodenal ulcers (neg for h pylori and eosinophils) ?Taking ppi bid  ?[place] abd -fatty liver with some cirrhosis ? ?Has chronic nausea  ?Has appt for follow up /has to set that up  ? ? ?Lab Results  ?Component Value Date  ? WBC 4.7 11/27/2021  ? HGB 12.3 11/27/2021  ? HCT 38.9 11/27/2021  ? MCV 89.4 11/27/2021  ? PLT 171 11/27/2021  ? ?Lab Results  ?Component Value Date  ? CREATININE 0.69 11/27/2021  ? BUN 7 (L) 11/27/2021  ? NA 137 11/27/2021  ? K 3.5 11/27/2021  ? CL 101 11/27/2021  ? CO2 29 11/27/2021  ? ?Lab Results  ?Component Value Date  ? ALT 27 11/26/2021  ? AST 56 (H) 11/26/2021  ? ALKPHOS 87 11/26/2021  ? BILITOT 2.1 (H) 11/26/2021  ? ? ?Bladder prolapse is severe /also rectocele  ?Gets nauseated when she has trouble passing stool  ?In need of pessary care  ?Has had a partial hysterectomy  ?Saw urogynecologist and had a bad experience  ? ?Is unable to get a pessary in and out by herself  ?Is interested in surgery later  ?? If vaginal estrogen would help  ? ?Interested in screening mammogram  ?Last one was 2015 at [facility]  ?No  lumps on self exam  ? ?Skin infections  ?Under breast currently  -has odor to it  ?Bright red and painful /not very itchy  ? ?Has used yeast cream otc  ? ?Patient Active Problem List  ? Diagnosis Date Noted  ? Gastritis 02/10/2022  ? Female bladder prolapse 02/10/2022  ? Encounter for screening mammogram for breast cancer 02/10/2022  ? Pessary maintenance 02/10/2022  ? Dehydration   ? Other chronic pain   ? Shortness of breath 11/26/2021  ? Elevated liver enzymes 08/28/2020  ? Compression fracture of L1 lumbar vertebra ([place]) 08/28/2020  ? 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 Frequent UTI 02/18/2016  ? Fever, unspecified 11/12/2015  ? Tremor 11/12/2015  ? UTI (urinary tract infection) 11/08/2015  ? Productive cough 11/08/2015  ? Constipation 04/12/2015  ? Cystocele 04/12/2015  ? Routine general medical examination at a health care facility 06/26/2014  ? Osteopenia 06/26/2014  ? Prediabetes 06/26/2014  ? Colon cancer screening 06/26/2014  ? Intertrigo 07/12/2013  ? Chronic fatigue syndrome 01/19/2013  ? Elevated alkaline phosphatase level 09/19/2012  ? Edema 09/16/2012  ? Chronic vomiting 08/05/2011  ? Diarrhea 04/14/2010  ? CONSTIPATION, CHRONIC 07/30/2008  ? Irritable bowel syndrome 07/30/2008  ? PURE HYPERCHOLESTEROLEMIA 06/21/2008  ? POSTMENOPAUSAL STATUS 06/21/2008  ? VAGINITIS, ATROPHIC 06/21/2008  ? Pain  in limb 06/21/2008  ? ?Past Medical History:  ?Diagnosis Date  ? Allergic rhinitis   ? Arthritis   ? Chronic fatigue syndrome   ? 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 Dr. Cristy Friedlander   ? Chronic gastritis   ? Dyspnea   ? Hepatic steatosis   ? History of kidney stones 05/2017  ? Hyperlipidemia   ? IBS (irritable bowel syndrome)   ? Lyme disease   ? "stari"  ? Migraine   ? Muscle twitching   ? Narcolepsy   ? Neuromuscular disorder ([place])   ? PONV (postoperative nausea and vomiting)   ? Rectal prolapse   ? Serrated adenoma of colon   ? Umbilical hernia   ? Vaginal  prolapse   ? [place] Virus infection 2003  ? ?Past Surgical History:  ?Procedure Laterality Date  ? 5th R toe fx  '09  ? ABDOMINAL HYSTERECTOMY    ? BREAST BIOPSY  '04  ? neg; right  ? CHOLECYSTECTOMY  10/2011  ? COLONOSCOPY  12/02/2021  ? 2017- 2- TAs  ? CYSTOSCOPY WITH STENT PLACEMENT Right 05/25/2017  ? Procedure: CYSTOSCOPY WITH STENT PLACEMENT;  Surgeon: Hollice Espy, MD;  Location: [facility];  Service: Urology;  Laterality: Right;  ? CYSTOSCOPY WITH STENT PLACEMENT Right 06/21/2017  ? Procedure: CYSTOSCOPY WITH STENT EXCHANGE;  Surgeon: Hollice Espy, MD;  Location: [facility];  Service: Urology;  Laterality: Right;  ? DIAGNOSTIC LAPAROSCOPY    ? EXTRACORPOREAL SHOCK WAVE LITHOTRIPSY Right 06/10/2017  ? Procedure: EXTRACORPOREAL SHOCK WAVE LITHOTRIPSY (ESWL);  Surgeon: Hollice Espy, MD;  Location: [facility];  Service: Urology;  Laterality: Right;  ? HOLMIUM LASER APPLICATION Right 35/32/9924  ? Procedure: HOLMIUM LASER APPLICATION;  Surgeon: Hollice Espy, MD;  Location: [facility];  Service: Urology;  Laterality: Right;  ? laprascopy    ? PARTIAL HYSTERECTOMY    ? bleeding  ? TONSILLECTOMY    ? TONSILLECTOMY    ? ULNAR NERVE REPAIR    ? sx times 5  ? UPPER GASTROINTESTINAL ENDOSCOPY  12/04/2004  ? gastritis  ? URETEROSCOPY WITH HOLMIUM LASER LITHOTRIPSY Right 06/21/2017  ? Procedure: URETEROSCOPY WITH HOLMIUM LASER LITHOTRIPSY;  Surgeon: Hollice Espy, MD;  Location: [facility];  Service: Urology;  Laterality: Right;  ? ?Social History  ? ?Tobacco Use  ? Smoking status: Never  ? Smokeless tobacco: Never  ? Tobacco comments:  ?  non smoker  ?Vaping Use  ? Vaping Use: Never used  ?Substance Use Topics  ? Alcohol use: No  ?  Alcohol/week: 0.0 standard drinks  ? Drug use: Not Currently  ?  Comment: history of opiate use abuse  ? ?Family History  ?Problem Relation Age of Onset  ? Diabetes Father   ? Hypertension Father   ? Stroke Father   ? Cancer Father   ? Hypertension Mother   ? Cancer Mother   ?      Waldenstroms  ? Ulcers Mother   ? Breast cancer Paternal Grandmother   ? Bipolar disorder Other   ?     some family  ? Alcohol abuse Maternal Grandfather   ? Colon cancer Neg Hx   ? Esophageal cancer Neg Hx   ? Pancreatic cancer Neg Hx   ? Ulcerative colitis Neg Hx   ? ?Allergies  ?Allergen Reactions  ? Other Nausea Only and Nausea And Vomiting  ?  All Antibiotics  ? Sulfonamide Derivatives Nausea And Vomiting  ? ?Current Outpatient Medications on File  Prior to Visit  ?Medication Sig Dispense Refill  ? cholecalciferol (VITAMIN D) 1000 units tablet Take 5,000 Units by mouth daily.     ? docusate sodium (COLACE) 100 MG capsule Take 1 capsule (100 mg total) by mouth 2 (two) times daily. 60 capsule 0  ? Milk Thistle Extract 175 MG TABS Take 1 tablet by mouth daily.    ? morphine (MSIR) 30 MG tablet Take 30 mg by mouth every 3 (three) hours as needed for severe pain. UAD PRN    ? Multiple Vitamin (MULTIVITAMIN) tablet Take 1 tablet by mouth daily.    ? nitrofurantoin, macrocrystal-monohydrate, (MACROBID) 100 MG capsule TAKE 1 CAPSULE BY MOUTH EVERY DAY 30 capsule 3  ? ondansetron (ZOFRAN) 8 MG tablet TAKE 1 TABLET BY MOUTH EVERY 8 HOURS AS NEEDED FOR NAUSEA AND VOMITING 30 tablet 3  ? pantoprazole (PROTONIX) 40 MG tablet Take 1 tablet (40 mg total) by mouth 2 (two) times daily before a meal. 60 tablet 11  ? promethazine (PHENERGAN) 12.5 MG tablet Take 1-2 tabs 3 times a day as needed for nausea 30 tablet 3  ? vitamin B-12 (CYANOCOBALAMIN) 1000 MCG tablet Place under the tongue.    ? ?No current facility-administered medications on file prior to visit.  ?  ? Review of Systems  ?Constitutional:  Positive for malaise/fatigue. Negative for chills and fever.  ?HENT:  Negative for congestion, ear pain, sinus pain and sore throat.   ?Eyes:  Negative for blurred vision, discharge and redness.  ?Respiratory:  Negative for cough, shortness of breath and stridor.   ?Cardiovascular:  Negative for chest pain, palpitations and leg  swelling.  ?Gastrointestinal:  Positive for constipation, nausea and vomiting. Negative for abdominal pain and diarrhea.  ?Genitourinary:  Positive for frequency and urgency.  ?     Urinary incontinence  ?M

## 2022-02-10 NOTE — Assessment & Plan Note (Signed)
Recent colonoscopy with adenomatous polyps  ?5 y recall  ?Dr Sloan Leiter  ?

## 2022-02-10 NOTE — Assessment & Plan Note (Signed)
Recent EGD with Dr Hilarie Fredrickson noted gastritis and some duodenal ulcers  ?Is taking protonix 40 mg bid  ?Planning GI f/u ?

## 2022-02-10 NOTE — Assessment & Plan Note (Signed)
Per description suspect fungal  ?Px ketoconazole to use daily  ?inst to keep very clean and dry  ?Update if not starting to improve in a week or if worsening   ?

## 2022-02-10 NOTE — Assessment & Plan Note (Signed)
Sounds like a rectocele is worsening this  ?

## 2022-02-10 NOTE — Assessment & Plan Note (Signed)
Screening mammogram order done for [place]  ?No lumps on self exam ?

## 2022-02-10 NOTE — Assessment & Plan Note (Signed)
Has seen uro gyn but prefers a female provider ?Referral made to gyn  ?Has a pessary/needs help inserting it and monitoring ?Unsure if vaginal estrogen would help or not  ?

## 2022-02-10 NOTE — Assessment & Plan Note (Signed)
Recent colonoscopy noted adenomatous polyps  ?Redundant colon  ?Required 2 preps ?

## 2022-02-10 NOTE — Assessment & Plan Note (Signed)
Fatty liver noted on [place]  ?Rev last labs and GI notes ?For GI f/u soon ?

## 2022-02-10 NOTE — Assessment & Plan Note (Signed)
Wants to try a pessary (gyn referral done)  ? ?Interested in surgery if a candidate in the future  ?

## 2022-03-16 ENCOUNTER — Encounter: Payer: Self-pay | Admitting: Internal Medicine

## 2022-03-17 ENCOUNTER — Encounter: Payer: Self-pay | Admitting: Family Medicine

## 2022-03-17 MED ORDER — PROMETHAZINE HCL 25 MG PO TABS: 25.0000 mg | ORAL_TABLET | Freq: Three times a day (TID) | ORAL | 3 refills | Status: DC | PRN

## 2022-03-18 IMAGING — US US ABDOMEN COMPLETE W/ ELASTOGRAPHY
1 series · 12 of 25 positions shown · non-contrast
Comparison: 09/24/2020

CLINICAL DATA: Fatty liver, history of the epididymi a, irritable
bowel syndrome, Lyme disease

EXAM:
ULTRASOUND ABDOMEN
ULTRASOUND HEPATIC ELASTOGRAPHY
TECHNIQUE: Sonography of the upper abdomen was performed. In addition,
ultrasound elastography evaluation of the liver was performed. A
region of interest was placed within the right lobe of the liver.
Following application of a compressive sonographic pulse, tissue
compressibility was assessed. Multiple assessments were performed at
the selected site. Median tissue compressibility was determined.
Previously, hepatic stiffness was assessed by shear wave velocity.
Based on recently published Society of Radiologists in Ultrasound
consensus article, reporting is now recommended to be performed in
the SI units of pressure (kiloPascals) representing hepatic
stiffness/elasticity. The obtained result is compared to the
published reference standards. (cACLD = compensated Advanced Chronic
Liver Disease)

[Series 1: us abdomen complete w/elastography · 12 of 73 slices shown]
[im 4/73]
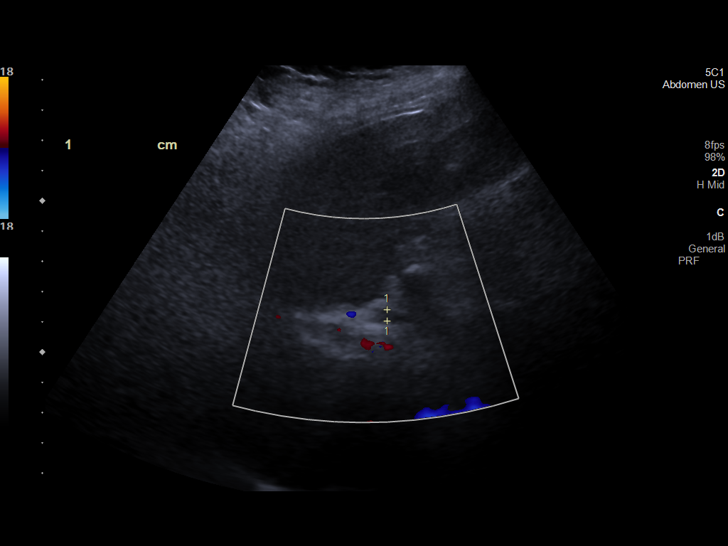
[im 10/73]
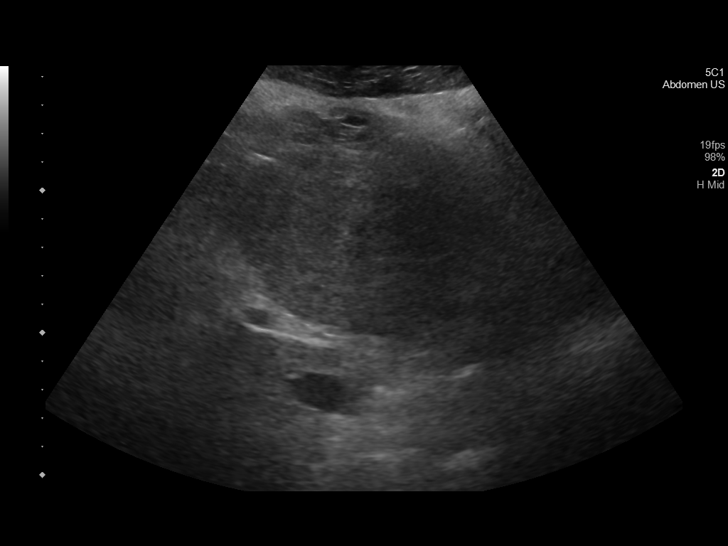
[im 16/73]
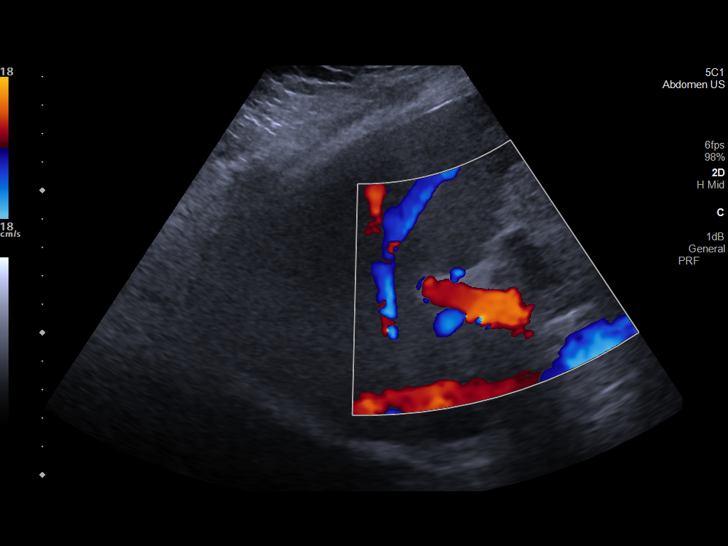
[im 22/73]
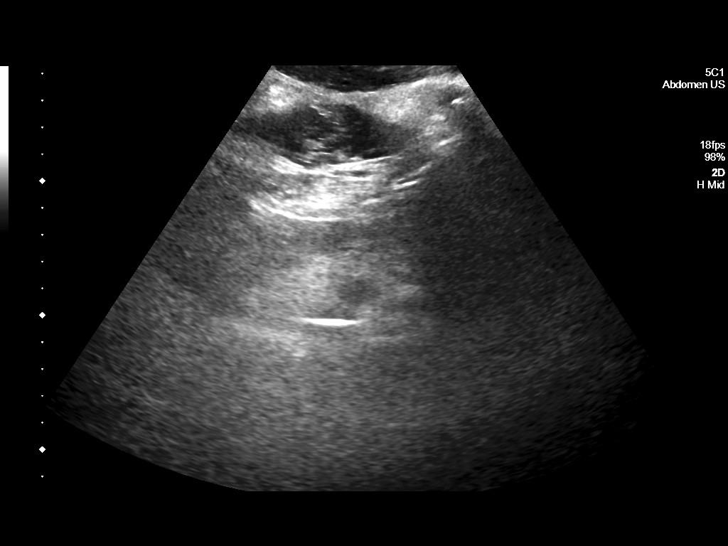
[im 28/73]
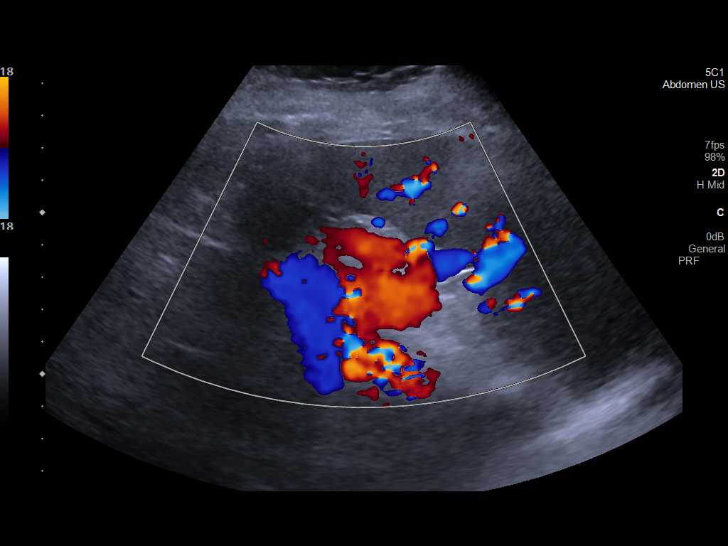
[im 34/73]
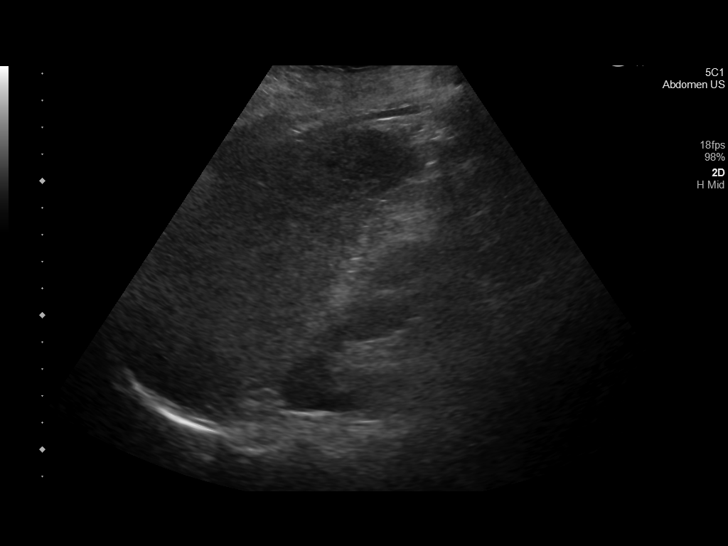
[im 40/73]
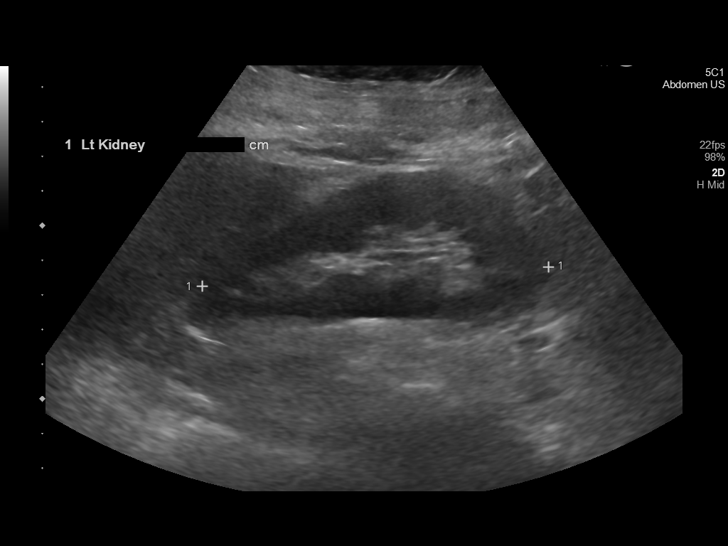
[im 46/73]
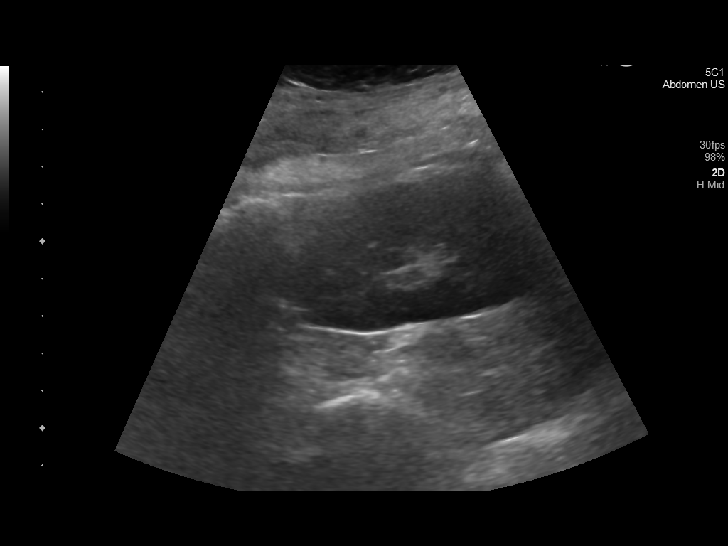
[im 52/73]
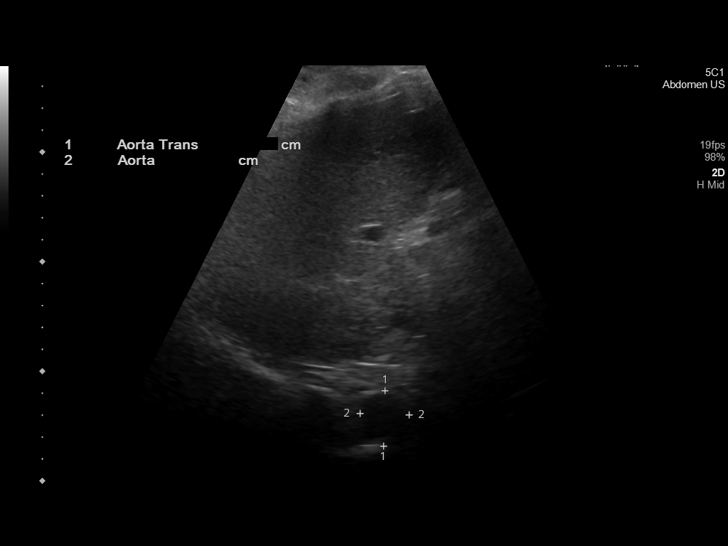
[im 58/73]
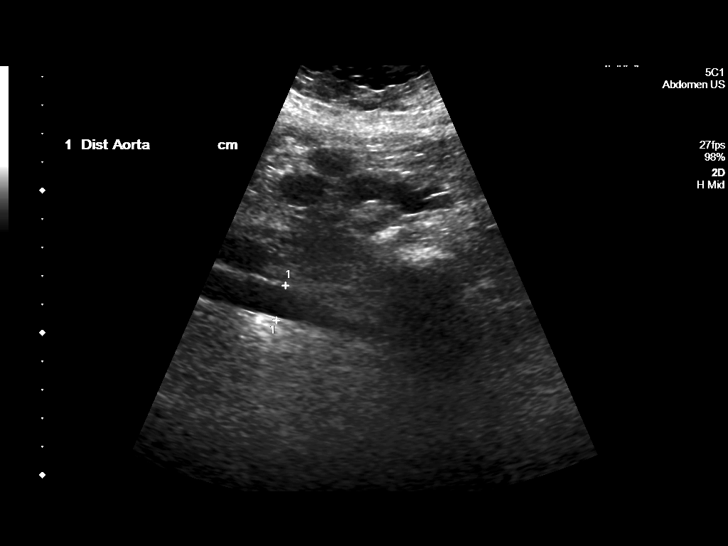
[im 64/73]
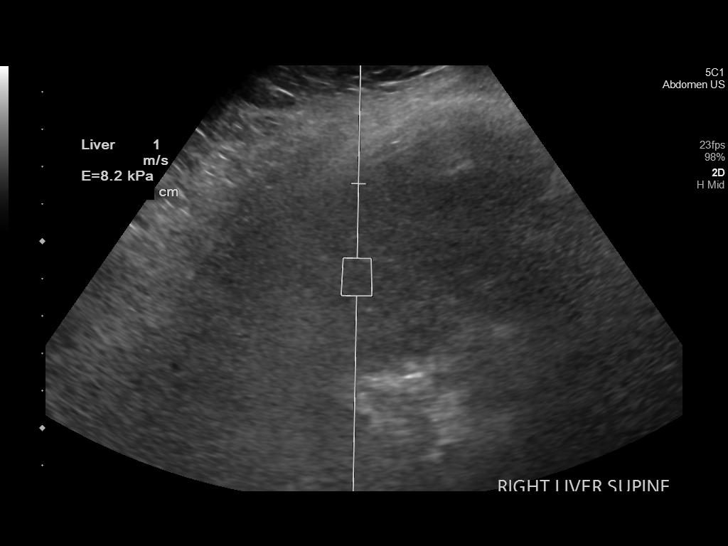
[im 70/73]
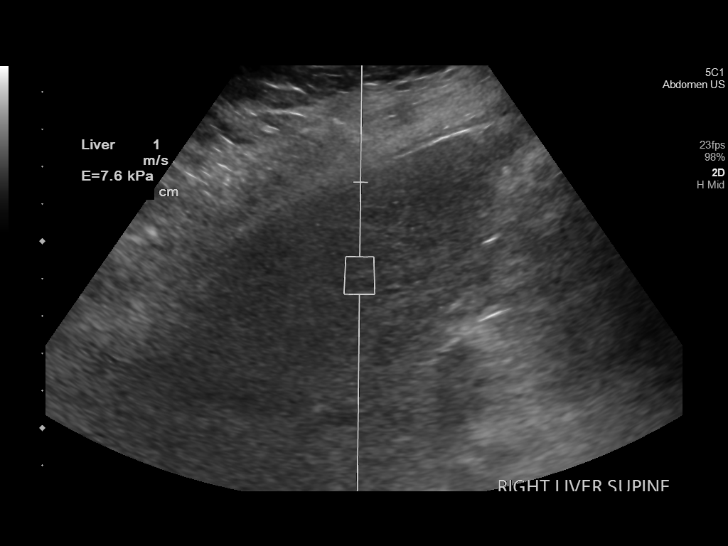

[12 of 25 positions shown; findings below may reference images not displayed]

FINDINGS: ULTRASOUND ABDOMEN

Gallbladder: Surgically absent

Common bile duct: Diameter: 4 mm, normal

Liver: Echogenic parenchyma, likely fatty infiltration though this
can be seen with cirrhosis and certain infiltrative disorders. No
focal hepatic mass or nodularity grossly identified though
assessment of intrahepatic detail is limited secondary to poor sound
transmission as a result of increased hepatic echogenicity and body
habitus. Portal vein is patent on color Doppler imaging with normal
direction of blood flow towards the liver.

IVC: Normal appearance

Pancreas: Portion of pancreatic head, body and proximal tail normal
appearance, remainder obscured by bowel gas

Spleen: Normal appearance, 11.3 cm length

Right Kidney: Length: 8.6 cm. Normal morphology without mass or
hydronephrosis.

Left Kidney: Length: 10.0 cm. Normal morphology without mass or
hydronephrosis.

Abdominal aorta: Normal caliber

Other findings: No free fluid

ULTRASOUND HEPATIC ELASTOGRAPHY

Device: Siemens Helix VTQ

Patient position: Supine

Transducer 5C1

Number of measurements: 10

Hepatic segment:  8

Median kPa:

IQR:

IQR/Median kPa ratio:

Data quality:  Good

Diagnostic category: < or = 9 kPa: in the absence of other known
clinical signs, rules out cACLD

The use of hepatic elastography is applicable to patients with viral
hepatitis and non-alcoholic fatty liver disease. At this time, there
is insufficient data for the referenced cut-off values and use in
other causes of liver disease, including alcoholic liver disease.
Patients, however, may be assessed by elastography and serve as
their own reference standard/baseline.

In patients with non-alcoholic liver disease, the values suggesting
compensated advanced chronic liver disease (cACLD) may be lower, and
patients may need additional testing with elasticity results of [DATE]
kPa.

Please note that abnormal hepatic elasticity and shear wave
velocities may also be identified in clinical settings other than
with hepatic fibrosis, such as: acute hepatitis, elevated right
heart and central venous pressures including use of beta blockers,
Danid disease ([place]), infiltrative processes such as
mastocytosis/amyloidosis/infiltrative tumor/lymphoma, extrahepatic
cholestasis, with hyperemia in the post-prandial state, and with
liver transplantation. Correlation with patient history, laboratory
data, and clinical condition recommended.

Diagnostic Categories:

< or =5 kPa: high probability of being normal

< or =9 kPa: in the absence of other known clinical signs, rules [DATE] kPa and ?13 kPa: suggestive of cACLD, but needs further testing

>13 kPa: highly suggestive of cACLD

> or =17 kPa: highly suggestive of cACLD with an increased
probability of clinically significant portal hypertension
IMPRESSION: ULTRASOUND ABDOMEN:

Post cholecystectomy.

Question fatty infiltration of liver.

Incomplete pancreatic visualization.

ULTRASOUND HEPATIC ELASTOGRAPHY:

Median kPa:

Diagnostic category: < or = 9 kPa: in the absence of other known
clinical signs, rules out cACLD

As noted above, in patients with non-alcoholic liver disease, the
values suggesting compensated advanced chronic liver disease (cACLD)
may be lower, and patients may need additional testing with
elasticity results of [DATE] kPa.

## 2022-03-20 ENCOUNTER — Telehealth: Payer: Self-pay | Admitting: Internal Medicine

## 2022-03-20 NOTE — Telephone Encounter (Signed)
Dr Hilarie Fredrickson before I call this pt are you doing any virtual visits?

## 2022-03-20 NOTE — Telephone Encounter (Signed)
Yes, that is an option if she prefers Thanks [employer]

## 2022-03-20 NOTE — Telephone Encounter (Signed)
Patient called requesting to schedule a video visit with Dr. Hilarie Fredrickson. Please call patient and schedule.  Thank you.

## 2022-03-24 NOTE — Telephone Encounter (Signed)
Kycen Spalla please see note below from Dr. Hilarie Fredrickson. [facility]ukes for virtual visit. Please schedule appt.

## 2022-03-24 NOTE — Telephone Encounter (Signed)
I tried to schedule, but it is showing no open slots available.

## 2022-03-25 NOTE — Telephone Encounter (Signed)
Scheduled pt for mychart virtual visit 04/01/22 at 11am. Pt notified via mychart.

## 2022-03-25 NOTE — Telephone Encounter (Signed)
Pt scheduled for mychart video visit 04/01/22 at 11am. Pt notified of visit via mychart.

## 2022-03-27 ENCOUNTER — Encounter: Payer: Self-pay | Admitting: *Deleted

## 2022-04-01 ENCOUNTER — Encounter: Payer: Self-pay | Admitting: Family Medicine

## 2022-04-01 ENCOUNTER — Encounter: Payer: Self-pay | Admitting: Internal Medicine

## 2022-04-01 ENCOUNTER — Telehealth (INDEPENDENT_AMBULATORY_CARE_PROVIDER_SITE_OTHER): Payer: 59 | Admitting: Internal Medicine

## 2022-04-01 VITALS — BP 118/68 | Ht 66.0 in | Wt 165.0 lb

## 2022-04-01 DIAGNOSIS — K5903 Drug induced constipation: Secondary | ICD-10-CM | POA: Diagnosis not present

## 2022-04-01 DIAGNOSIS — K2970 Gastritis, unspecified, without bleeding: Secondary | ICD-10-CM | POA: Diagnosis not present

## 2022-04-01 DIAGNOSIS — K5909 Other constipation: Secondary | ICD-10-CM

## 2022-04-01 DIAGNOSIS — T402X5A Adverse effect of other opioids, initial encounter: Secondary | ICD-10-CM

## 2022-04-01 DIAGNOSIS — R112 Nausea with vomiting, unspecified: Secondary | ICD-10-CM

## 2022-04-01 DIAGNOSIS — K2100 Gastro-esophageal reflux disease with esophagitis, without bleeding: Secondary | ICD-10-CM

## 2022-04-01 DIAGNOSIS — K760 Fatty (change of) liver, not elsewhere classified: Secondary | ICD-10-CM

## 2022-04-01 DIAGNOSIS — K2990 Gastroduodenitis, unspecified, without bleeding: Secondary | ICD-10-CM

## 2022-04-01 NOTE — Progress Notes (Signed)
Subjective:    Patient ID: [name] Branch, female    DOB: 04-04-1956, 66 y.o.   MRN: [id number]  This service was provided via telemedicine with audio/visual communication. The patient was located at home The provider was located in provider's GI office. The patient did consent to this telephone visit and is aware of possible charges through their insurance for this visit.   The persons participating in this telemedicine service were the patient and I. Time spent on call:  18 min   HPI [name] Branch is a 66 year old female with a history of GERD with esophagitis, gastroduodenitis and peptic ulcer disease without H. pylori, chronic nausea and vomiting, fatty liver, adenomatous colon polyps, chronic constipation, pelvic floor dysfunction with bladder and vaginal prolapse who is seen for follow-up.  She is seen today by MyChart video visit as above.  She also has a history of recurrent UTI, hypercholesterolemia.  She reports that her stomach issues are slowly getting better.  She continues pantoprazole twice daily.  She does have issues with nausea and vomiting and is using promethazine once daily.  She is on Macrobid daily which causes nausea and vomiting but promethazine prevents this.  She also notices with her chronic constipation that she can have issues with nausea vomiting when she is not completely emptying her bowels.  She is currently using Colace 100 mg twice daily and MiraLAX.  She is having a bowel movement about every 5 days and on this day she has multiple large and frequent bowel movements.  This is not pure diarrhea but it is a dumping type effect which can be difficult for her to control..  The other day she is not having a bowel movement.  She is still having ongoing issues with bladder and vaginal prolapse.  She continues MS IR but has slowly reduce this dose over the last year or so.  No blood in stool or melena.  She will be seeing gynecology in [place] on  04/30/2022.  See chart for EGD and colonoscopy performed this year.  Review of Systems As per HPI, otherwise negative  Current Medications, Allergies, Past Medical History, Past Surgical History, Family History and Social History were reviewed in [employer] record.    Objective:   Physical Exam BP 118/68 Comment: Patient took at home  Ht '5\' 6"'$  (1.676 m)   Wt 165 lb (74.8 kg)   BMI 26.63 kg/m  Gen: awake, alert, NAD, well-appearing by video visit HEENT: anicteric      Latest Ref Rng & Units 11/27/2021    4:06 AM 11/26/2021    3:50 PM 08/28/2020    4:44 PM  CBC  WBC 4.0 - 10.5 K/uL 4.7   3.7   7.9    Hemoglobin 12.0 - 15.0 g/dL 12.3   13.2   13.9    Hematocrit 36.0 - 46.0 % 38.9   41.7   42.3    Platelets 150 - 400 K/uL 171   160   206.0     No results found for: INR, PROTIME  CMP     Component Value Date/Time   NA 137 11/27/2021 0406   NA 141 11/12/2015 1506   NA 136 11/17/2011 0249   K 3.5 11/27/2021 0406   K 3.8 11/17/2011 0249   CL 101 11/27/2021 0406   CL 97 (L) 11/17/2011 0249   CO2 29 11/27/2021 0406   CO2 25 11/17/2011 0249   GLUCOSE 116 (H) 11/27/2021 0406   GLUCOSE  103 (H) 11/17/2011 0249   BUN 7 (L) 11/27/2021 0406   BUN 8 11/12/2015 1506   BUN 4 (L) 11/17/2011 0249   CREATININE 0.69 11/27/2021 0406   CREATININE 0.55 (L) 11/17/2011 0249   CALCIUM 8.6 (L) 11/27/2021 0406   CALCIUM 8.8 11/17/2011 0249   PROT 6.8 11/26/2021 1550   PROT 8.4 11/12/2015 1506   PROT 7.2 11/16/2011 0658   ALBUMIN 3.6 11/26/2021 1550   ALBUMIN 4.4 11/12/2015 1506   ALBUMIN 2.9 (L) 11/16/2011 0658   AST 56 (H) 11/26/2021 1550   AST 34 11/16/2011 0658   ALT 27 11/26/2021 1550   ALT 34 11/16/2011 0658   ALKPHOS 87 11/26/2021 1550   ALKPHOS 84 11/16/2011 0658   BILITOT 2.1 (H) 11/26/2021 1550   BILITOT 0.6 11/12/2015 1506   BILITOT 0.9 11/16/2011 0658   GFRNONAA >60 11/27/2021 0406   GFRNONAA >60 11/17/2011 0249   GFRAA >60 07/20/2020 2021   GFRAA  >60 11/17/2011 0249    ULTRASOUND ABDOMEN   ULTRASOUND HEPATIC ELASTOGRAPHY   TECHNIQUE: Sonography of the upper abdomen was performed. In addition, ultrasound elastography evaluation of the liver was performed. A region of interest was placed within the right lobe of the liver. Following application of a compressive sonographic pulse, tissue compressibility was assessed. Multiple assessments were performed at the selected site. Median tissue compressibility was determined. Previously, hepatic stiffness was assessed by shear wave velocity. Based on recently published Society of Radiologists in Ultrasound consensus article, reporting is now recommended to be performed in the SI units of pressure (kiloPascals) representing hepatic stiffness/elasticity. The obtained result is compared to the published reference standards. (cACLD = compensated Advanced Chronic Liver Disease)   COMPARISON:  09/24/2020   FINDINGS: ULTRASOUND ABDOMEN   Gallbladder: Surgically absent   Common bile duct: Diameter: 4 mm, normal   Liver: Echogenic parenchyma, likely fatty infiltration though this can be seen with cirrhosis and certain infiltrative disorders. No focal hepatic mass or nodularity grossly identified though assessment of intrahepatic detail is limited secondary to poor sound transmission as a result of increased hepatic echogenicity and body habitus. Portal vein is patent on color Doppler imaging with normal direction of blood flow towards the liver.   IVC: Normal appearance   Pancreas: Portion of pancreatic head, body and proximal tail normal appearance, remainder obscured by bowel gas   Spleen: Normal appearance, 11.3 cm length   Right Kidney: Length: 8.6 cm. Normal morphology without mass or hydronephrosis.   Left Kidney: Length: 10.0 cm. Normal morphology without mass or hydronephrosis.   Abdominal aorta: Normal caliber   Other findings: No free fluid   ULTRASOUND HEPATIC  ELASTOGRAPHY   Device: Siemens Helix VTQ   Patient position: Supine   Transducer 5C1   Number of measurements: 10   Hepatic segment:  8   Median kPa: 7.3   IQR: 2.0   IQR/Median kPa ratio: 0.27   Data quality:  Good   Diagnostic category: < or = 9 kPa: in the absence of other known clinical signs, rules out cACLD   The use of hepatic elastography is applicable to patients with viral hepatitis and non-alcoholic fatty liver disease. At this time, there is insufficient data for the referenced cut-off values and use in other causes of liver disease, including alcoholic liver disease. Patients, however, may be assessed by elastography and serve as their own reference standard/baseline.   In patients with non-alcoholic liver disease, the values suggesting compensated advanced chronic liver disease (cACLD) may be lower,  and patients may need additional testing with elasticity results of 7-9 kPa.   Please note that abnormal hepatic elasticity and shear wave velocities may also be identified in clinical settings other than with hepatic fibrosis, such as: acute hepatitis, elevated right heart and central venous pressures including use of beta blockers, veno-occlusive disease ([place]), infiltrative processes such as mastocytosis/amyloidosis/infiltrative tumor/lymphoma, extrahepatic cholestasis, with hyperemia in the post-prandial state, and with liver transplantation. Correlation with patient history, laboratory data, and clinical condition recommended.   Diagnostic Categories:   < or =5 kPa: high probability of being normal   < or =9 kPa: in the absence of other known clinical signs, rules out cACLD   >9 kPa and ?13 kPa: suggestive of cACLD, but needs further testing   >13 kPa: highly suggestive of cACLD   > or =17 kPa: highly suggestive of cACLD with an increased probability of clinically significant portal hypertension   IMPRESSION: ULTRASOUND ABDOMEN:    Post cholecystectomy.   Question fatty infiltration of liver.   Incomplete pancreatic visualization.   ULTRASOUND HEPATIC ELASTOGRAPHY:   Median kPa:  7.3   Diagnostic category: < or = 9 kPa: in the absence of other known clinical signs, rules out cACLD   As noted above, in patients with non-alcoholic liver disease, the values suggesting compensated advanced chronic liver disease (cACLD) may be lower, and patients may need additional testing with elasticity results of 7-9 kPa.     Electronically Signed   By: Lavonia Elease M.D.   On: 12/25/2021 13:47        Assessment & Plan:  66 year old female with a history of GERD with esophagitis, gastroduodenitis and peptic ulcer disease without H. pylori, chronic nausea and vomiting, fatty liver, adenomatous colon polyps, chronic constipation, pelvic floor dysfunction with bladder and vaginal prolapse who is seen for follow-up.  Chronic constipation related likely to opioid pain medication but more importantly to pelvic floor dysfunction and prolapse --we discussed this at length today.  I am going to try her on Movantik which will be new for her.  She can continue the docusate and MiraLAX.  She has been referred to gynecology but to my knowledge has not seen urogynecology.  I will refer her to see Dr. Zigmund Daniel for her opinion as well.  The patient remains interested in surgical correction of this issue. --Can continue Colace 100 mg twice daily, can continue MiraLAX 17 g a day --Trial of Movantik 25 mg once daily for opioid related constipation; patient advised to let me know if this medication is ineffective or if she has side effects from it.  2.  GERD with esophagitis/gastroduodenitis --continue pantoprazole 40 mg twice daily and avoiding NSAIDs.  3.  Chronic nausea and vomiting --still an issue for which promethazine seems to be quite effective without sedation. --Continue promethazine 25 mg 3 times daily as needed; please increase her  prescription to #90 for 30 days.  She is not using this 3 times a day every day so this should last longer than 30 days --See #2  4.  Fatty liver --patient has had a mild elevation in bilirubin and AST.  Ultrasound elastography was performed and reviewed.  There is no evidence of overt cirrhosis or decompensated liver disease.  We will need to monitor this closely going forward.  I recommended Vitamin E and as well as continuing to control risk factors --Begin vitamin E 800 IU daily --CBC, CMP and INR in August; please place order for this  5.  History  of colon polyps --colonoscopy earlier this year, repeat colonoscopy recommended in 5 years which will be around April 2028  Follow-up in 4 to 5 months, sooner if needed; okay for virtual video visit best for the patient

## 2022-04-02 ENCOUNTER — Telehealth: Payer: Self-pay

## 2022-04-02 MED ORDER — PROMETHAZINE HCL 25 MG PO TABS: 25.0000 mg | ORAL_TABLET | Freq: Three times a day (TID) | ORAL | 3 refills | Status: DC | PRN

## 2022-04-02 MED ORDER — NALOXEGOL OXALATE 25 MG PO TABS: 25.0000 mg | ORAL_TABLET | Freq: Every day | ORAL | 3 refills | Status: DC

## 2022-04-02 MED ORDER — VITAMIN E 180 MG (400 UNIT) PO CAPS: 800.0000 [IU] | ORAL_CAPSULE | Freq: Every day | ORAL | 3 refills | Status: DC

## 2022-04-02 NOTE — Telephone Encounter (Signed)
Tried to call patient with information about appointment with Dr. Hilarie Fredrickson yesterday (medications and referral sent).  No answer, no voicemail.  Sent patient a Pharmacist, community message with all the pertinent information about her visit and her referral to [place] [facility].

## 2022-04-02 NOTE — Telephone Encounter (Signed)
-----   Message from Jerene Bears, MD sent at 04/01/2022 11:45 AM EDT ----- Please see my recs: -- New start Movantik 25 mg daily --New start vitamin E 800 IU daily --Referral to Dr. [place] Pink with urogynecology; pelvic floor prolapse --Continue Colace, MiraLAX and pantoprazole twice daily --Increase quantity of promethazine 25 mg 3 times daily as needed number 90/month  Thanks JMP

## 2022-04-30 ENCOUNTER — Encounter: Payer: Medicare Other | Admitting: Obstetrics and Gynecology

## 2022-04-30 DIAGNOSIS — Z7689 Persons encountering health services in other specified circumstances: Secondary | ICD-10-CM

## 2022-04-30 DIAGNOSIS — N8110 Cystocele, unspecified: Secondary | ICD-10-CM

## 2022-05-14 ENCOUNTER — Ambulatory Visit (INDEPENDENT_AMBULATORY_CARE_PROVIDER_SITE_OTHER): Payer: 59

## 2022-05-14 VITALS — Ht 66.0 in | Wt 175.0 lb

## 2022-05-14 DIAGNOSIS — Z0000 Encounter for general adult medical examination without abnormal findings: Secondary | ICD-10-CM | POA: Diagnosis not present

## 2022-05-14 NOTE — Patient Instructions (Signed)
[name] , Thank you for taking time to come for your Medicare Wellness Visit. I appreciate your ongoing commitment to your health goals. Please review the following plan we discussed and let me know if I can assist you in the future.   Screening recommendations/referrals: Colonoscopy: completed 01/22/2022, due 01/23/2027 Mammogram: due Bone Density: completed 08/06/2014 Recommended yearly ophthalmology/optometry visit for glaucoma screening and checkup Recommended yearly dental visit for hygiene and checkup  Vaccinations: Influenza vaccine: decline Pneumococcal vaccine: decline Tdap vaccine: completed 06/26/2014, due 06/26/2024 Shingles vaccine: discussed   Covid-19: 08/09/2020, 08/30/2020  Advanced directives: Please bring a copy of your POA (Power of Attorney) and/or Living Will to your next appointment.   Conditions/risks identified: none  Next appointment: Follow up in one year for your annual wellness visit    Preventive Care 65 Years and Older, Female Preventive care refers to lifestyle choices and visits with your health care provider that can promote health and wellness. What does preventive care include? A yearly physical exam. This is also called an annual well check. Dental exams once or twice a year. Routine eye exams. Ask your health care provider how often you should have your eyes checked. Personal lifestyle choices, including: Daily care of your teeth and gums. Regular physical activity. Eating a healthy diet. Avoiding tobacco and drug use. Limiting alcohol use. Practicing safe sex. Taking low-dose aspirin every day. Taking vitamin and mineral supplements as recommended by your health care provider. What happens during an annual well check? The services and screenings done by your health care provider during your annual well check will depend on your age, overall health, lifestyle risk factors, and family history of disease. Counseling  Your health care  provider may ask you questions about your: Alcohol use. Tobacco use. Drug use. Emotional well-being. Home and relationship well-being. Sexual activity. Eating habits. History of falls. Memory and ability to understand (cognition). Work and work Statistician. Reproductive health. Screening  You may have the following tests or measurements: Height, weight, and BMI. Blood pressure. Lipid and cholesterol levels. These may be checked every 5 years, or more frequently if you are over 41 years old. Skin check. Lung cancer screening. You may have this screening every year starting at age 36 if you have a 30-pack-year history of smoking and currently smoke or have quit within the past 15 years. Fecal occult blood test (FOBT) of the stool. You may have this test every year starting at age 77. Flexible sigmoidoscopy or colonoscopy. You may have a sigmoidoscopy every 5 years or a colonoscopy every 10 years starting at age 10. Hepatitis C blood test. Hepatitis B blood test. Sexually transmitted disease (STD) testing. Diabetes screening. This is done by checking your blood sugar (glucose) after you have not eaten for a while (fasting). You may have this done every 1-3 years. Bone density scan. This is done to screen for osteoporosis. You may have this done starting at age 64. Mammogram. This may be done every 1-2 years. Talk to your health care provider about how often you should have regular mammograms. Talk with your health care provider about your test results, treatment options, and if necessary, the need for more tests. Vaccines  Your health care provider may recommend certain vaccines, such as: Influenza vaccine. This is recommended every year. Tetanus, diphtheria, and acellular pertussis (Tdap, Td) vaccine. You may need a Td booster every 10 years. Zoster vaccine. You may need this after age 64. Pneumococcal 13-valent conjugate (PCV13) vaccine. One dose is  recommended after age  76. Pneumococcal polysaccharide (PPSV23) vaccine. One dose is recommended after age 10. Talk to your health care provider about which screenings and vaccines you need and how often you need them. This information is not intended to replace advice given to you by your health care provider. Make sure you discuss any questions you have with your health care provider. Document Released: 11/08/2015 Document Revised: 07/01/2016 Document Reviewed: 08/13/2015 Elsevier Interactive Patient Education  2017 [employer] Prevention in the Home Falls can cause injuries. They can happen to people of all ages. There are many things you can do to make your home safe and to help prevent falls. What can I do on the outside of my home? Regularly fix the edges of walkways and driveways and fix any cracks. Remove anything that might make you trip as you walk through a door, such as a raised step or threshold. Trim any bushes or trees on the path to your home. Use bright outdoor lighting. Clear any walking paths of anything that might make someone trip, such as rocks or tools. Regularly check to see if handrails are loose or broken. Make sure that both sides of any steps have handrails. Any raised decks and porches should have guardrails on the edges. Have any leaves, snow, or ice cleared regularly. Use sand or salt on walking paths during winter. Clean up any spills in your garage right away. This includes oil or grease spills. What can I do in the bathroom? Use night lights. Install grab bars by the toilet and in the tub and shower. Do not use towel bars as grab bars. Use non-skid mats or decals in the tub or shower. If you need to sit down in the shower, use a plastic, non-slip stool. Keep the floor dry. Clean up any water that spills on the floor as soon as it happens. Remove soap buildup in the tub or shower regularly. Attach bath mats securely with double-sided non-slip rug tape. Do not have throw  rugs and other things on the floor that can make you trip. What can I do in the bedroom? Use night lights. Make sure that you have a light by your bed that is easy to reach. Do not use any sheets or blankets that are too big for your bed. They should not hang down onto the floor. Have a firm chair that has side arms. You can use this for support while you get dressed. Do not have throw rugs and other things on the floor that can make you trip. What can I do in the kitchen? Clean up any spills right away. Avoid walking on wet floors. Keep items that you use a lot in easy-to-reach places. If you need to reach something above you, use a strong step stool that has a grab bar. Keep electrical cords out of the way. Do not use floor polish or wax that makes floors slippery. If you must use wax, use non-skid floor wax. Do not have throw rugs and other things on the floor that can make you trip. What can I do with my stairs? Do not leave any items on the stairs. Make sure that there are handrails on both sides of the stairs and use them. Fix handrails that are broken or loose. Make sure that handrails are as long as the stairways. Check any carpeting to make sure that it is firmly attached to the stairs. Fix any carpet that is loose or worn. Avoid having  throw rugs at the top or bottom of the stairs. If you do have throw rugs, attach them to the floor with carpet tape. Make sure that you have a light switch at the top of the stairs and the bottom of the stairs. If you do not have them, ask someone to add them for you. What else can I do to help prevent falls? Wear shoes that: Do not have high heels. Have rubber bottoms. Are comfortable and fit you well. Are closed at the toe. Do not wear sandals. If you use a stepladder: Make sure that it is fully opened. Do not climb a closed stepladder. Make sure that both sides of the stepladder are locked into place. Ask someone to hold it for you, if  possible. Clearly mark and make sure that you can see: Any grab bars or handrails. First and last steps. Where the edge of each step is. Use tools that help you move around (mobility aids) if they are needed. These include: Canes. Walkers. Scooters. Crutches. Turn on the lights when you go into a dark area. Replace any light bulbs as soon as they burn out. Set up your furniture so you have a clear path. Avoid moving your furniture around. If any of your floors are uneven, fix them. If there are any pets around you, be aware of where they are. Review your medicines with your doctor. Some medicines can make you feel dizzy. This can increase your chance of falling. Ask your doctor what other things that you can do to help prevent falls. This information is not intended to replace advice given to you by your health care provider. Make sure you discuss any questions you have with your health care provider. Document Released: 08/08/2009 Document Revised: 03/19/2016 Document Reviewed: 11/16/2014 Elsevier Interactive Patient Education  2017 [employer].

## 2022-05-14 NOTE — Progress Notes (Signed)
I connected with [name] Branch today by telephone and verified that I am speaking with the correct person using two identifiers. Location patient: home Location provider: work Persons participating in the virtual visit: [name], [name] LPN.   I discussed the limitations, risks, security and privacy concerns of performing an evaluation and management service by telephone and the availability of in person appointments. I also discussed with the patient that there may be a patient responsible charge related to this service. The patient expressed understanding and verbally consented to this telephonic visit.    Interactive audio and video telecommunications were attempted between this provider and patient, however failed, due to patient having technical difficulties OR patient did not have access to video capability.  We continued and completed visit with audio only.     Vital signs may be patient reported or missing.  Subjective:   [name] Branch is a 66 y.o. female who presents for an Initial Medicare Annual Wellness Visit.  Review of Systems     Cardiac Risk Factors include: advanced age (>62mn, >>52women)     Objective:    Today's Vitals   05/14/22 1527 05/14/22 1529  Weight: 175 lb (79.4 kg)   Height: '5\' 6"'$  (1.676 m)   PainSc:  5    Body mass index is 28.25 kg/m.     05/14/2022    3:40 PM 11/26/2021    8:51 PM 11/26/2021    3:48 PM 07/20/2020    6:30 PM 10/18/2019    6:34 PM 09/19/2018   12:49 PM 06/21/2017   10:19 AM  Advanced Directives  Does Patient Have a Medical Advance Directive? Yes Yes Yes Yes No No Yes  Type of [place]aramed[place]of [place] will [place] will [place] will [place] will   [place] Does patient want to make changes to medical advance directive?  No - Patient declined     No - Patient declined  Copy of  HU[place] Chart? No - copy requested No - copy requested     Yes  Would patient like information on creating a medical advance directive?    No - Patient declined No - Patient declined No - Patient declined     Current Medications (verified) Outpatient Encounter Medications as of 05/14/2022  Medication Sig   cholecalciferol (VITAMIN D) 1000 units tablet Take 5,000 Units by mouth daily.    docusate sodium (COLACE) 100 MG capsule Take 1 capsule (100 mg total) by mouth 2 (two) times daily.   HM MAGNESIUM CITRATE PO Take 1 tablet by mouth daily.   ketoconazole (NIZORAL) 2 % cream Apply 1 application. topically daily. To affected area   Milk Thistle Extract 175 MG TABS Take 1 tablet by mouth daily.   morphine (MSIR) 30 MG tablet Take 30 mg by mouth every 3 (three) hours as needed for severe pain. UAD PRN   Multiple Vitamin (MULTIVITAMIN) tablet Take 1 tablet by mouth daily.   Multiple Vitamins-Minerals (HAIR SKIN AND NAILS FORMULA PO) Take 1 tablet by mouth daily.   nitrofurantoin, macrocrystal-monohydrate, (MACROBID) 100 MG capsule TAKE 1 CAPSULE BY MOUTH EVERY DAY   pantoprazole (PROTONIX) 40 MG tablet Take 1 tablet (40 mg total) by mouth 2 (two) times daily before a meal.   promethazine (PHENERGAN) 25 MG tablet Take 1 tablet (25 mg total) by mouth 3 (three) times daily as needed for nausea or  vomiting.   vitamin B-12 (CYANOCOBALAMIN) 1000 MCG tablet Place under the tongue.   vitamin E 180 MG (400 UNITS) capsule Take 2 capsules (800 Units total) by mouth daily.   naloxegol oxalate (MOVANTIK) 25 MG TABS tablet Take 1 tablet (25 mg total) by mouth daily.   No facility-administered encounter medications on file as of 05/14/2022.    Allergies (verified) Other and Sulfonamide derivatives   History: Past Medical History:  Diagnosis Date   Allergic rhinitis    Arthritis    Chronic fatigue syndrome    CFIDs; also chronic joint and mucle pain (possible tik bourne illness?).  Dr. Cristy Friedlander    Chronic gastritis    Dyspnea    Hepatic steatosis    History of kidney stones 05/2017   Hyperlipidemia    IBS (irritable bowel syndrome)    Lyme disease    "stari"   Migraine    Muscle twitching    Narcolepsy    Neuromuscular disorder (HCC)    PONV (postoperative nausea and vomiting)    Rectal prolapse    Schatzki's ring    Serrated adenoma of colon    Tubular adenoma of colon    Umbilical hernia    Vaginal prolapse    West Nile Virus infection 2003   Past Surgical History:  Procedure Laterality Date   5th R toe fx  '09   ABDOMINAL HYSTERECTOMY     BREAST BIOPSY  '04   neg; right   CHOLECYSTECTOMY  10/2011   COLONOSCOPY  12/02/2021   2017- 2- TAs   CYSTOSCOPY WITH STENT PLACEMENT Right 05/25/2017   Procedure: CYSTOSCOPY WITH STENT PLACEMENT;  Surgeon: Hollice Espy, MD;  Location: [facility];  Service: Urology;  Laterality: Right;   CYSTOSCOPY WITH STENT PLACEMENT Right 06/21/2017   Procedure: CYSTOSCOPY WITH STENT EXCHANGE;  Surgeon: Hollice Espy, MD;  Location: [facility];  Service: Urology;  Laterality: Right;   DIAGNOSTIC LAPAROSCOPY     EXTRACORPOREAL SHOCK WAVE LITHOTRIPSY Right 06/10/2017   Procedure: EXTRACORPOREAL SHOCK WAVE LITHOTRIPSY (ESWL);  Surgeon: Hollice Espy, MD;  Location: [facility];  Service: Urology;  Laterality: Right;   HOLMIUM LASER APPLICATION Right 22/29/7989   Procedure: HOLMIUM LASER APPLICATION;  Surgeon: Hollice Espy, MD;  Location: [facility];  Service: Urology;  Laterality: Right;   laprascopy     PARTIAL HYSTERECTOMY     bleeding   TONSILLECTOMY     TONSILLECTOMY     ULNAR NERVE REPAIR     sx times 5   UPPER GASTROINTESTINAL ENDOSCOPY  12/04/2004   gastritis   URETEROSCOPY WITH HOLMIUM LASER LITHOTRIPSY Right 06/21/2017   Procedure: URETEROSCOPY WITH HOLMIUM LASER LITHOTRIPSY;  Surgeon: Hollice Espy, MD;  Location: [facility];  Service: Urology;  Laterality: Right;   Family History  Problem Relation Age of Onset    Diabetes Father    Hypertension Father    Stroke Father    Cancer Father    Hypertension Mother    Cancer Mother        Waldenstroms   Ulcers Mother    Breast cancer Paternal Grandmother    Bipolar disorder Other        some family   Alcohol abuse Maternal Grandfather    Colon cancer Neg Hx    Esophageal cancer Neg Hx    Pancreatic cancer Neg Hx    Ulcerative colitis Neg Hx    Social History   Socioeconomic History   Marital status: Married    Spouse name: Not on file  Number of children: 0   Years of education: Not on file   Highest education level: Not on file  Occupational History   Occupation: owner  Tobacco Use   Smoking status: Never   Smokeless tobacco: Never   Tobacco comments:    non smoker  Vaping Use   Vaping Use: Never used  Substance and Sexual Activity   Alcohol use: No    Alcohol/week: 0.0 standard drinks of alcohol   Drug use: Yes    Types: Morphine    Comment: history of opiate use abuse   Sexual activity: Not Currently  Other Topics Concern   Not on file  Social History Narrative   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 Married, no children.    Social Determinants of Health   Financial Resource Strain: Low Risk  (05/14/2022)   Overall Financial Resource Strain (CARDIA)    Difficulty of Paying Living Expenses: Not hard at all  Food Insecurity: No Food Insecurity (05/14/2022)   Hunger Vital Sign    Worried About Running Out of Food in the Last Year: Never true    Ran Out of Food in the Last Year: Never true  Transportation Needs: No Transportation Needs (05/14/2022)   PRAPARE - Hydrologist (Medical): No    Lack of Transportation (Non-Medical): No  Physical Activity: Inactive (05/14/2022)   Exercise Vital Sign    Days of Exercise per Week: 0 days    Minutes of Exercise per Session: 0 min  Stress: No Stress Concern Present (05/14/2022)   [place]    Feeling  of Stress : Not at all  Social Connections: Not on file    Tobacco Counseling Counseling given: Not Answered Tobacco comments: non smoker   Clinical Intake:  Pre-visit preparation completed: Yes  Pain : 0-10 Pain Score: 5  Pain Type: Chronic pain Pain Location: Generalized Pain Descriptors / Indicators: Aching, Sharp Pain Onset: More than a month ago Pain Frequency: Constant     Nutritional Status: BMI 25 -29 Overweight Nutritional Risks: Nausea/ vomitting/ diarrhea (nausea and vomiting) Diabetes: No  How often do you need to have someone help you when you read instructions, pamphlets, or other written materials from your doctor or pharmacy?: 1 - Never What is the last grade level you completed in school?: college  Diabetic? no  Interpreter Needed?: No  Information entered by :: NAllen LPN   Activities of Daily Living    05/14/2022    3:44 PM 11/26/2021    8:51 PM  In your present state of health, do you have any difficulty performing the following activities:  Hearing? 0 0  Vision? 0 0  Difficulty concentrating or making decisions? 0 0  Walking or climbing stairs? 1 1  Dressing or bathing? 1 1  Doing errands, shopping? 1 1  Comment does not drive   Preparing Food and eating ? Y   Comment husband preparing meals   Using the Toilet? N   In the past six months, have you accidently leaked urine? Y   Do you have problems with loss of bowel control? N   Managing your Medications? N   Managing your Finances? N   Housekeeping or managing your Housekeeping? N     Patient Care Team: Tower, Wynelle Fanny, MD as PCP - General (Family Medicine)  Indicate any recent Medical Services you may have received from other than [facility] providers in the past year (date may be approximate).  Assessment:   This is a routine wellness examination for Taleeyah.  Hearing/Vision screen Vision Screening - Comments:: No regular eye exams, Dr. Atilano Median  Dietary issues and exercise activities  discussed: Current Exercise Habits: The patient does not participate in regular exercise at present   Goals Addressed             This Visit's Progress    Patient Stated       05/14/2022, wants to get as well as she can       Depression Screen    05/14/2022    3:43 PM 08/28/2020    3:41 PM  PHQ 2/9 Scores  PHQ - 2 Score 0 0    Fall Risk    05/14/2022    3:41 PM 08/28/2020    3:41 PM  [place] in the past year? 0 1  Number falls in past yr: 0 1  Injury with Fall? 0 0  Risk for fall due to : Impaired balance/gait;History of fall(s);Impaired mobility;Medication side effect   Follow up Falls evaluation completed;Education provided;Falls prevention discussed     FALL RISK PREVENTION PERTAINING TO THE HOME:  Any stairs in or around the home? Yes  If so, are there any without handrails? No  Home free of loose throw rugs in walkways, pet beds, electrical cords, etc? Yes  Adequate lighting in your home to reduce risk of falls? Yes   ASSISTIVE DEVICES UTILIZED TO PREVENT FALLS:  Life alert? No  Use of a cane, walker or w/c? Yes  Grab bars in the bathroom? No  Shower chair or bench in shower? Yes  Elevated toilet seat or a handicapped toilet? No   TIMED UP AND GO:  Was the test performed? No .      Cognitive Function:        05/14/2022    3:46 PM  6CIT Screen  What Year? 0 points  What month? 0 points  What time? 0 points  Count back from 20 0 points  Months in reverse 0 points  Repeat phrase 2 points  Total Score 2 points    Immunizations Immunization History  Administered Date(s) Administered   PFIZER(Purple Top)SARS-COV-2 Vaccination 08/09/2020, 08/30/2020   Td 09/25/2004   Tdap 06/26/2014    TDAP status: Up to date  Flu Vaccine status: Declined, Education has been provided regarding the importance of this vaccine but patient still declined. Advised may receive this vaccine at local pharmacy or Health Dept. Aware to provide a copy of the  vaccination record if obtained from local pharmacy or Health Dept. Verbalized acceptance and understanding.  Pneumococcal vaccine status: Declined,  Education has been provided regarding the importance of this vaccine but patient still declined. Advised may receive this vaccine at local pharmacy or Health Dept. Aware to provide a copy of the vaccination record if obtained from local pharmacy or Health Dept. Verbalized acceptance and understanding.   Covid-19 vaccine status: Completed vaccines  Qualifies for Shingles Vaccine? Yes   Zostavax completed No   Shingrix Completed?: No.    Education has been provided regarding the importance of this vaccine. Patient has been advised to call insurance company to determine out of pocket expense if they have not yet received this vaccine. Advised may also receive vaccine at local pharmacy or Health Dept. Verbalized acceptance and understanding.  Screening Tests Health Maintenance  Topic Date Due   Zoster Vaccines- Shingrix (1 of 2) Never done   MAMMOGRAM  08/07/2015   COVID-19  Vaccine (3 - Pfizer series) 10/25/2020   Pneumonia Vaccine 24+ Years old (1 - PCV) Never done   INFLUENZA VACCINE  05/26/2022   TETANUS/TDAP  06/26/2024   COLONOSCOPY (Pts 45-62yr Insurance coverage will need to be confirmed)  01/23/2027   DEXA SCAN  Completed   Hepatitis C Screening  Completed   HPV VACCINES  Aged Out    Health Maintenance  Health Maintenance Due  Topic Date Due   Zoster Vaccines- Shingrix (1 of 2) Never done   MAMMOGRAM  08/07/2015   COVID-19 Vaccine (3 - Pfizer series) 10/25/2020   Pneumonia Vaccine 66 Years old (1 - PCV) Never done    Colorectal cancer screening: Type of screening: Colonoscopy. Completed 01/22/2022. Repeat every 5 years  Mammogram status: decline at this time  Bone Density status: Completed 08/06/2014.   Lung Cancer Screening: (Low Dose CT Chest recommended if Age 66-80years, 30 pack-year currently smoking OR have quit w/in  15years.) does not qualify.   Lung Cancer Screening Referral: no  Additional Screening:  Hepatitis C Screening: does qualify; Completed 11/27/2021  Vision Screening: Recommended annual ophthalmology exams for early detection of glaucoma and other disorders of the eye. Is the patient up to date with their annual eye exam?  Yes  Who is the provider or what is the name of the office in which the patient attends annual eye exams? Dr. CAtilano MedianIf pt is not established with a provider, would they like to be referred to a provider to establish care? No .   Dental Screening: Recommended annual dental exams for proper oral hygiene  Community Resource Referral / Chronic Care Management: CRR required this visit?  No   CCM required this visit?  No      Plan:     I have personally reviewed and noted the following in the patient's chart:   Medical and social history Use of alcohol, tobacco or illicit drugs  Current medications and supplements including opioid prescriptions. Patient is currently taking opioid prescriptions. Information provided to patient regarding non-opioid alternatives. Patient advised to discuss non-opioid treatment plan with their provider. Functional ability and status Nutritional status Physical activity Advanced directives List of other physicians Hospitalizations, surgeries, and ER visits in previous 12 months Vitals Screenings to include cognitive, depression, and falls Referrals and appointments  In addition, I have reviewed and discussed with patient certain preventive protocols, quality metrics, and best practice recommendations. A written personalized care plan for preventive services as well as general preventive health recommendations were provided to patient.     NKellie Simmering LPN   [id number]  Nurse Notes: none  Due to this being a virtual visit, the after visit summary with patients personalized plan was offered to patient via mail or my-chart.  Patient would like to access on my-chart

## 2022-05-19 ENCOUNTER — Encounter: Payer: Self-pay | Admitting: Family Medicine

## 2022-05-19 DIAGNOSIS — N390 Urinary tract infection, site not specified: Secondary | ICD-10-CM

## 2022-05-19 DIAGNOSIS — N8110 Cystocele, unspecified: Secondary | ICD-10-CM

## 2022-05-19 DIAGNOSIS — N3946 Mixed incontinence: Secondary | ICD-10-CM

## 2022-05-20 ENCOUNTER — Other Ambulatory Visit: Payer: Self-pay | Admitting: Family Medicine

## 2022-05-22 NOTE — Telephone Encounter (Signed)
Is this okay to refill , offer pt an appt to come pt said that she wasn't able to come that you , knew the issues that she was having.

## 2022-06-04 ENCOUNTER — Other Ambulatory Visit: Payer: Self-pay

## 2022-06-04 DIAGNOSIS — R32 Unspecified urinary incontinence: Secondary | ICD-10-CM

## 2022-06-04 DIAGNOSIS — N390 Urinary tract infection, site not specified: Secondary | ICD-10-CM

## 2022-06-05 ENCOUNTER — Other Ambulatory Visit
Admission: RE | Admit: 2022-06-05 | Discharge: 2022-06-05 | Disposition: A | Discharge status: Home / Self Care | Payer: 59 | Attending: Urology | Admitting: Urology

## 2022-06-05 ENCOUNTER — Encounter: Payer: Self-pay | Admitting: Urology

## 2022-06-05 ENCOUNTER — Ambulatory Visit: Admission: RE | Admit: 2022-06-05 | Payer: 59 | Source: Home / Self Care

## 2022-06-05 ENCOUNTER — Ambulatory Visit (INDEPENDENT_AMBULATORY_CARE_PROVIDER_SITE_OTHER): Payer: 59 | Admitting: Urology

## 2022-06-05 VITALS — BP 132/76 | HR 132 | Ht 66.0 in | Wt 175.0 lb

## 2022-06-05 DIAGNOSIS — R32 Unspecified urinary incontinence: Secondary | ICD-10-CM | POA: Insufficient documentation

## 2022-06-05 DIAGNOSIS — N390 Urinary tract infection, site not specified: Secondary | ICD-10-CM | POA: Diagnosis present

## 2022-06-05 DIAGNOSIS — N3946 Mixed incontinence: Secondary | ICD-10-CM | POA: Diagnosis not present

## 2022-06-05 DIAGNOSIS — N8111 Cystocele, midline: Secondary | ICD-10-CM | POA: Diagnosis not present

## 2022-06-05 LAB — URINALYSIS, COMPLETE (UACMP) WITH MICROSCOPIC
Bacteria, UA: NONE SEEN
Bilirubin Urine: NEGATIVE
Glucose, UA: NEGATIVE mg/dL
Hgb urine dipstick: NEGATIVE
Ketones, ur: NEGATIVE mg/dL
Leukocytes,Ua: NEGATIVE
Nitrite: NEGATIVE
Protein, ur: NEGATIVE mg/dL
Specific Gravity, Urine: 1.01 (ref 1.005–1.030)
pH: 7 (ref 5.0–8.0)

## 2022-06-05 LAB — BLADDER SCAN AMB NON-IMAGING

## 2022-06-05 NOTE — Patient Instructions (Signed)
Robotic-Assisted Sacrocolpopexy What is robotic-assisted sacrocolpopexy? Robotic-assisted sacrocolpopexy is a type of surgery. It's done to repair pelvic organ prolapse. The surgery is done with special tools.  Your pelvis is a bowl-shaped cavity made of a set of bones in the lower part of your belly (abdomen). Within this area, there are several organs. These include the uterus, the bladder, and the lower part of your intestines. Strong tissues help hold these organs in place. If the tissues weaken, 1 or more of these organs may drop down and press against or bulge into the vagina. This is called pelvic organ prolapse. One type of pelvic organ prolapse is called vaginal vault prolapse. This is when the upper part of the vagina folds down into the lower part. Or it may even push outside the vaginal opening. This can happen after a hysterectomy.  Robotic-assisted sacrocolpopexy is 1 type of surgery to repair this problem. It is done to pull up the tissues and move the organs back into place. It is a minimally invasive method. This means it uses smaller cuts (incisions) than a standard surgery. It's done while you're asleep under general anesthesia.  During the surgery, your healthcare provider will put small tools and a tiny camera through small incisions in your lower belly. This gives your healthcare provider a better view of the area in your body. Your provider moves the tools using a robotic controller. This lets your provider do very small and precise movements with the tools. A graft of tissue or synthetic mesh is sewn onto the pelvic organs that have prolapsed. The graft is then attached to a bony area at the lower part of the spinal column. This helps keep the pelvic organs in place. The tools are then removed. The incisions are closed and bandaged.  Robotic-assisted sacrocolpopexy has some benefits over other methods. It may have a lower risk of complications for some people. It can lead to a  shorter hospital stay and a faster recovery time.  This surgery may not be available in all areas. Robotic surgery is often more expensive than other methods. It can take longer than other types of surgery. Your healthcare provider can help you decide which surgery will work best for you.  How to say it [id number]  Why might I need robotic-assisted sacrocolpopexy? Robotic-assisted sacrocolpopexy can help relieve the symptoms of pelvic organ prolapse, such as:  Fullness or pressure in the vagina  A bulge in the vagina or tissue bulging out from the vagina  Leaking urine when you cough, sneeze, or laugh  Sudden urges to urinate  Constipation  Pain with sexual intercourse  Pelvic organ prolapse can sometimes be treated without surgery. These treatments can include pelvic floor exercises or the use of a pessary. A pessary is a small device inserted into the vagina to provide support. Your healthcare provider may advise surgery if these options don't work or if you have moderate to severe prolapse. Your provider may advise you to have the surgery only if you don't plan to have children in the future.  If you decide to have surgery, you may have certain options. For example, some women choose to have their uterus removed (hysterectomy) as part of their surgery. The best type of surgery for you may vary depending on the severity and location of your prolapse. Your provider can help you decide which type of surgery may be best for you.  What are the risks of robotic-assisted sacrocolpopexy? Every surgery has risks. The risks of  this procedure include:  Infection  Excess bleeding  Blood clots that can travel to the lungs and cause breathing problems  Injury to nearby organs such as the bowel or ureters  Wound healing problems  Pain during sexual intercourse  Inflammation in the vagina if mesh is used  Reaction to anesthesia  Failure of the organs to stay in  place  Return of prolapse symptoms  Movement of the mesh  Need for more surgery  Your risks may vary depending on your age, your overall health, and the severity and type of your prolapse. Before the surgery, talk with your healthcare provider about all your concerns.  How do I get ready for a robotic-assisted sacrocolpopexy? Talk with your healthcare provider about how to prepare for your surgery.  Tell your provider about all the medicines you take. This includes over-the-counter medicines such as aspirin and all prescription medicines. It also includes herbs, vitamins, and other supplements. You may need to stop taking some medicines before the surgery, such as blood thinners.  If you smoke, you'll need to stop before your surgery. Smoking can delay healing. Talk with your provider if you need help to stop smoking.  Don't eat or drink after midnight the night before your surgery.  Tell your provider about any recent changes in your health, such as a fever.  Follow any other instructions from your provider.  You may need to have tests before your surgery, such as:  Electrocardiogram, to evaluate your heart rhythm  Chest X-ray, to assess your heart and lungs  Urine sample, to test for infection and other factors  Blood tests, to check for infection, anemia, and kidney function  What happens during a robotic-assisted sacrocolpopexy? Your healthcare provider can help explain the details of your surgery. An obstetrician/gynecologist (OB/GYN) surgeon or a urology surgeon will perform your surgery. They will be helped by a team of specialized nurses. In general, you can expect the following:  You will be given general anesthesia. This prevents pain and causes you to sleep through the surgery.  A healthcare provider will carefully watch your vital signs, like your heart rate and blood pressure, during the surgery.  You may be given antibiotics before and after the surgery. This is  to help prevent infection.  The surgeon will make a few small incisions on your lower abdomen.  The surgeon will pass tools through the small incisions. These include a tiny camera with a light and several robotic tools.  Your surgeon will use the robotic controller to move the tools to complete the different parts of the surgery.  If you are going to have a hysterectomy, the surgeon will remove your uterus first. In some cases, your provider and you may decide to leave a part of your cervix in place. This may decrease the complications for the procedure.  The surgeon will then lift up the prolapsed part of your vagina.  A graft of tissue or synthetic mesh is sewn onto the pelvic organs that have prolapsed. This helps keep them in place. The graft or mesh is anchored with stitches to strong tissue in the pelvic area, usually a bony area at the base of the spinal column.  Additional steps may be done to repair a prolapsed rectum, bladder, or other tissues.  When the surgery is done, the tools will be removed. The incisions will be closed and bandaged.  What happens after a robotic-assisted sacrocolpopexy? After the surgery, your vital signs will be watched. You may  need to stay overnight in the hospital. When you're ready to go home, you'll need to have someone drive you.  You may have some pain after the surgery. You can have pain medicine as needed. You can resume a normal diet as soon as you are able to.  Moving around as soon as possible after surgery can help prevent problems such as blood clots. You may need to do breathing therapy to help expand your lungs after surgery.  You may also have some fluid leaking from the incisions. Tell your provider if there is a lot of fluid or if the incisions are red or warm. Call your provider right away if you have a fever, heavy bleeding, severe pain, trouble breathing, or other severe symptoms.  Follow all your provider's instructions about wound  care and medicines. Limit your movement and sexual activity as advised. Make sure to go to all your follow-up appointments.  Your prolapse symptoms may go away completely after surgery. Talk with your provider if your symptoms do not go away or if they return.

## 2022-06-05 NOTE — Progress Notes (Signed)
06/05/2022 2:35 PM   [name] Branch [birth year] [id number]  Referring provider:  Abner Greenspan, MD [address] [place],  [place] [address] Chief Complaint  Patient presents with   Urinary Incontinence    New Patient     HPI: [name] Branch is a 65 y.o.female who presents today for further evaluation of mixed and urge incontinence.   She was last seen in clinic in 07/2017 and was noted to have pelvic organ prolapse and a septic obstructing right ureteral calculus. She underwent emergent ureteral stent placement followed by definitive ureteroscopy after failing Shockwave Lithotripsy on 06/21/2017. Her stone analysis was consistent with 5% calcium oxalate dihydrate, 90% calcium oxalate monohydrate, and 5% calcium phosphate. RUS visualized a minimum pelvicaliectasis  remains on the right with no gross hydronephrosis, no renal stones.  She reports today that she was seeing a urologist for her prolapse. She reports urinary accidents. She has had urodynamics in the past. She brought in photos today dating back to 2018 of profound grade 3/4 pelvic organ prolapse.  She is status post hysterectomy.  She has a rectocele but notes that it is fairly minimal.  She has photographs dating back to 2018 that show the progressive severity of her cystocele.  She is tearful today about this and anxious to have it addressed.    She believes she was seeing ? Dr. Jeffie Pollock vs [facility] in the past for recurrent UTIs/ mixed incontinence.  More recently, she was being seen by Dr. Ouida Sills who tried a pessary.  She was not pleased with this nor the experience.  She was also referred by me to [facility] urogynecology back in 2019.  At that time, they had requested records for her urodynamics which possibly was performed in [place].  It looks like she never followed up.  She is tearful and frustrated today.  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 Dr. Cristy Friedlander    Chronic gastritis    Dyspnea    Hepatic steatosis    History of kidney stones 05/2017   Hyperlipidemia    IBS (irritable bowel syndrome)    Lyme disease    "stari"   Migraine    Muscle twitching    Narcolepsy    Neuromuscular disorder (HCC)    PONV (postoperative nausea and vomiting)    Rectal prolapse    Schatzki's ring    Serrated adenoma of colon    Tubular adenoma of colon    Umbilical hernia    Vaginal prolapse    West Nile Virus infection 2003    Surgical History: Past Surgical History:  Procedure Laterality Date   5th R toe fx  '09   ABDOMINAL HYSTERECTOMY     BREAST BIOPSY  '04   neg; right   CHOLECYSTECTOMY  10/2011   COLONOSCOPY  12/02/2021   2017- 2- TAs   CYSTOSCOPY WITH STENT PLACEMENT Right 05/25/2017   Procedure: CYSTOSCOPY WITH STENT PLACEMENT;  Surgeon: Hollice Espy, MD;  Location: [facility];  Service: Urology;  Laterality: Right;   CYSTOSCOPY WITH STENT PLACEMENT Right 06/21/2017   Procedure: CYSTOSCOPY WITH STENT EXCHANGE;  Surgeon: Hollice Espy, MD;  Location: [facility];  Service: Urology;  Laterality: Right;   DIAGNOSTIC LAPAROSCOPY     EXTRACORPOREAL SHOCK WAVE LITHOTRIPSY Right 06/10/2017   Procedure: EXTRACORPOREAL SHOCK WAVE LITHOTRIPSY (  ESWL);  Surgeon: Hollice Espy, MD;  Location: [facility];  Service: Urology;  Laterality: Right;   HOLMIUM LASER APPLICATION Right 40/07/2724   Procedure: HOLMIUM LASER APPLICATION;  Surgeon: Hollice Espy, MD;  Location: [facility];  Service: Urology;  Laterality: Right;   laprascopy     PARTIAL HYSTERECTOMY     bleeding   TONSILLECTOMY     TONSILLECTOMY     ULNAR NERVE REPAIR     sx times 5   UPPER GASTROINTESTINAL ENDOSCOPY  12/04/2004   gastritis   URETEROSCOPY WITH HOLMIUM LASER LITHOTRIPSY Right 06/21/2017   Procedure: URETEROSCOPY WITH HOLMIUM LASER LITHOTRIPSY;  Surgeon: Hollice Espy, MD;   Location: [facility];  Service: Urology;  Laterality: Right;    Home Medications:  Allergies as of 06/05/2022       Reactions   Other Nausea Only, Nausea And Vomiting   All Antibiotics   Sulfonamide Derivatives Nausea And Vomiting        Medication List        Accurate as of June 05, 2022 11:59 PM. If you have any questions, ask your nurse or doctor.          STOP taking these medications    naloxegol oxalate 25 MG Tabs tablet Commonly known as: Movantik Stopped by: Hollice Espy, MD       TAKE these medications    cholecalciferol 1000 units tablet Commonly known as: VITAMIN D Take 5,000 Units by mouth daily.   cyanocobalamin 1000 MCG tablet Commonly known as: VITAMIN B12 Place under the tongue.   docusate sodium 100 MG capsule Commonly known as: COLACE Take 1 capsule (100 mg total) by mouth 2 (two) times daily.   HAIR SKIN AND NAILS FORMULA PO Take 1 tablet by mouth daily.   HM MAGNESIUM CITRATE PO Take 1 tablet by mouth daily.   ketoconazole 2 % cream Commonly known as: NIZORAL Apply 1 application. topically daily. To affected area   Milk Thistle Extract 175 MG Tabs Take 1 tablet by mouth daily.   morphine 30 MG tablet Commonly known as: MSIR Take 30 mg by mouth every 3 (three) hours as needed for severe pain. UAD PRN   multivitamin tablet Take 1 tablet by mouth daily.   nitrofurantoin (macrocrystal-monohydrate) 100 MG capsule Commonly known as: MACROBID TAKE 1 CAPSULE BY MOUTH EVERY DAY   pantoprazole 40 MG tablet Commonly known as: PROTONIX Take 1 tablet (40 mg total) by mouth 2 (two) times daily before a meal.   promethazine 25 MG tablet Commonly known as: PHENERGAN Take 1 tablet (25 mg total) by mouth 3 (three) times daily as needed for nausea or vomiting.   vitamin E 180 MG (400 UNITS) capsule Commonly known as: vitamin E Take 2 capsules (800 Units total) by mouth daily.        Allergies:  Allergies  Allergen Reactions    Other Nausea Only and Nausea And Vomiting    All Antibiotics   Sulfonamide Derivatives Nausea And Vomiting    Family History: Family History  Problem Relation Age of Onset   Diabetes Father    Hypertension Father    Stroke Father    Cancer Father    Hypertension Mother    Cancer Mother        Waldenstroms   Ulcers Mother    Breast cancer Paternal Grandmother    Bipolar disorder Other        some family   Alcohol abuse Maternal Grandfather    Colon cancer Neg  Hx    Esophageal cancer Neg Hx    Pancreatic cancer Neg Hx    Ulcerative colitis Neg Hx     Social History:  reports that she has never smoked. She has never used smokeless tobacco. She reports current drug use. Drug: Morphine. She reports that she does not drink alcohol.   Physical Exam: BP 132/76   Pulse (!) 132   Ht '5\' 6"'$  (1.676 m)   Wt 175 lb (79.4 kg)   BMI 28.25 kg/m   Constitutional:  Alert and oriented, No acute distress. HEENT: [place] AT, moist mucus membranes.  Trachea midline, no masses. Cardiovascular: No clubbing, cyanosis, or edema. Respiratory: Normal respiratory effort, no increased work of breathing. Skin: No rashes, bruises or suspicious lesions. Neurologic: Grossly intact, no focal deficits, moving all 4 extremities. Psychiatric: Normal mood and affect.  Laboratory Data: Lab Results  Component Value Date   CREATININE 0.69 11/27/2021   Lab Results  Component Value Date   HGBA1C 5.7 (H) 05/23/2017    Urinalysis Component     Latest Ref Rng 06/05/2022  Glucose, UA     NEGATIVE mg/dL NEGATIVE   WBC, UA     0 - 5 WBC/hpf 0-5   Bacteria, UA     NONE SEEN  NONE SEEN   Color, Urine     YELLOW  YELLOW   Appearance     CLEAR  CLEAR   Specific Gravity, Urine     1.005 - 1.030  1.010   pH     5.0 - 8.0  7.0   Hgb urine dipstick     NEGATIVE  NEGATIVE   Bilirubin Urine     NEGATIVE  NEGATIVE   Ketones, ur     NEGATIVE mg/dL NEGATIVE   Protein     NEGATIVE mg/dL NEGATIVE   Nitrite      NEGATIVE  NEGATIVE   Leukocytes,Ua     NEGATIVE  NEGATIVE   RBC / HPF     0 - 5 RBC/hpf 0-5   Squamous Epithelial / LPF     0 - 5  0-5      Pertinent Imaging: Post-void Residual  06/05/2022  Scan Result 67m     Assessment & Plan:    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 -She does appear to be emptying her bladder fairly well without evidence of UTI today - Based on degrees and severity  a robotic sacrocolpopexy would be the most reasonable this is outside the scope of my practice; she was provided with printed educational material regarding the procedure itself along with risk and benefits -Discussed that she may need to have repeat urodynamics depending on availability of these results and the surgeons comfort level with the age of the study at this point - Wiill refer to Dr BSuezanne JacquetHDoreen Beamas a scribe for AHollice Espy MD.,have documented all relevant documentation on the behalf of AHollice Espy MD,as directed by  AHollice Espy MD while in the presence of AHollice Espy MD.  I have reviewed the above documentation for accuracy and completeness, and I agree with the above.   AHollice Espy MD    [facility] [address] [place] [phone]

## 2022-07-17 ENCOUNTER — Encounter: Payer: Self-pay | Admitting: Internal Medicine

## 2022-07-20 NOTE — Telephone Encounter (Signed)
Datra, I am sorry to hear you are having these spells  It would be reasonable to use an over-the-counter Imodium or loperamide which comes in 2 mg.  You could take 2 of these (4 mg) prior to bedtime the day before your appointment. You could so take an additional 4 mg dose the day of the appointment if you needed to.

## 2022-07-21 ENCOUNTER — Encounter: Payer: 59 | Admitting: Obstetrics and Gynecology

## 2022-08-03 ENCOUNTER — Encounter: Payer: Self-pay | Admitting: Obstetrics and Gynecology

## 2022-08-17 ENCOUNTER — Other Ambulatory Visit: Payer: Self-pay | Admitting: Urology

## 2022-08-18 ENCOUNTER — Telehealth: Payer: Self-pay | Admitting: Family Medicine

## 2022-08-18 NOTE — Telephone Encounter (Signed)
Zona from [facility] called in stated will be faxing over surgical clearance for pt any questions # [phone] ext [phone]

## 2022-08-18 NOTE — Telephone Encounter (Signed)
Per PCP pt will need an in office surgical clearance appt., we can't clear her until she is seen, pt hasn't been seen in office since 2021

## 2022-08-18 NOTE — Telephone Encounter (Signed)
Spoke to pt, she stated her surgery is on 08/26/22 & needs the sign off by [employer]. Explained to pt, [facility] hasn't seen her since 2021 & needs to be seen. Pt stated she'd call Urologist & ask can they continue with surgery without [facility]'s approval. Call back # [phone]

## 2022-08-18 NOTE — Telephone Encounter (Signed)
Left VM letting [facility] know pt's plan and we can't clear her until she is seen in person

## 2022-09-01 ENCOUNTER — Ambulatory Visit (INDEPENDENT_AMBULATORY_CARE_PROVIDER_SITE_OTHER): Payer: 59 | Admitting: Family Medicine

## 2022-09-01 ENCOUNTER — Encounter: Payer: Self-pay | Admitting: Family Medicine

## 2022-09-01 VITALS — BP 135/60 | HR 101 | Temp 97.9°F | Ht 66.0 in

## 2022-09-01 DIAGNOSIS — G9332 Myalgic encephalomyelitis/chronic fatigue syndrome: Secondary | ICD-10-CM

## 2022-09-01 DIAGNOSIS — R7303 Prediabetes: Secondary | ICD-10-CM | POA: Diagnosis not present

## 2022-09-01 DIAGNOSIS — R000 Tachycardia, unspecified: Secondary | ICD-10-CM

## 2022-09-01 DIAGNOSIS — K5909 Other constipation: Secondary | ICD-10-CM

## 2022-09-01 DIAGNOSIS — R748 Abnormal levels of other serum enzymes: Secondary | ICD-10-CM

## 2022-09-01 DIAGNOSIS — N8110 Cystocele, unspecified: Secondary | ICD-10-CM | POA: Diagnosis not present

## 2022-09-01 DIAGNOSIS — G8929 Other chronic pain: Secondary | ICD-10-CM

## 2022-09-01 DIAGNOSIS — Z01810 Encounter for preprocedural cardiovascular examination: Secondary | ICD-10-CM | POA: Diagnosis not present

## 2022-09-01 NOTE — Assessment & Plan Note (Signed)
No longer going to [facility] She is gradually weaning off of her morphine

## 2022-09-01 NOTE — Assessment & Plan Note (Addendum)
Per pt pulse rate is nl at home generally  Is higher here  Improved after sitting   EKG noted rate of 111 with mildly short PR interval of 112 Pain and exertion tend to raise her pulse in the office No cp or cardiac history

## 2022-09-01 NOTE — Progress Notes (Signed)
Subjective:    Patient ID: [name] Branch, female    DOB: Aug 03, 1956, 66 y.o.   MRN: [id number]  HPI Pt presents to discuss medical clearance for urologic surgery   Wt Readings from Last 3 Encounters:  06/05/22 175 lb (79.4 kg)  05/14/22 175 lb (79.4 kg)  04/01/22 165 lb (74.8 kg)   In wheelchair/ could not weigh 28.25 kg/m  Planning urology surgery - roboic sacrocolpopexy, mid urethral sling and cystoscopy Dr Louis Meckel, planned 12/1  May consider removal of ovaries at the same time  Hopes to be off of proph abx once surgery is done   All to sulfa drugs   Has not had flu shots-was told not to by a prev doctor May re think that  Considering the RSV vaccine  Will get the covid booster   Has recurrent rash and uses ketoconazole  Has to use pads to keep area dry   No blood thinners No aspirin   GI problems are improved but a work in progress   Does not see a cardiologist   Saw pulmonary for cough/wheezing in 2020 and PFTs were normal  She was tx for mild reactive airways but did not require that for long  Watches her 02 at home at times  Today pulse ox is 97%  Pain control - was dropped from clinic  Has a small amount of morphine left she is coming off of it  Dr Louis Meckel is aware  Is in more pain     BP Readings from Last 3 Encounters:  09/01/22 135/60  06/05/22 132/76  04/01/22 118/68    Pulse Readings from Last 3 Encounters:  09/01/22 (!) 124  06/05/22 (!) 132  01/22/22 92   Usually pulse at home is good (she thinks)  Bp is usually 120s/80s    Lab Results  Component Value Date   CREATININE 0.69 11/27/2021   BUN 7 (L) 11/27/2021   NA 137 11/27/2021   K 3.5 11/27/2021   CL 101 11/27/2021   CO2 29 11/27/2021   Lab Results  Component Value Date   WBC 4.7 11/27/2021   HGB 12.3 11/27/2021   HCT 38.9 11/27/2021   MCV 89.4 11/27/2021   PLT 171 11/27/2021   Lab Results  Component Value Date   VITAMINB12 665 11/12/2015   Takes  ppi  Prediabetes  Lab Results  Component Value Date   HGBA1C 5.7 (H) 05/23/2017    Lab Results  Component Value Date   ALT 27 11/26/2021   AST 56 (H) 11/26/2021   ALKPHOS 87 11/26/2021   BILITOT 2.1 (H) 11/26/2021    Lab Results  Component Value Date   TSH 1.008 11/26/2021      Last echocardiogram 11/2018 Had EF of over 65%   Will be getting EKG for pre op as well   Patient Active Problem List   Diagnosis Date Noted   Pre-operative cardiovascular examination 09/01/2022   Gastritis 02/10/2022   Female bladder prolapse 02/10/2022   Encounter for screening mammogram for breast cancer 02/10/2022   Other chronic pain    Shortness of breath 11/26/2021   Elevated liver enzymes 08/28/2020   Compression fracture of L1 lumbar vertebra ([place]) 08/28/2020   Mixed stress and urge urinary incontinence 10/29/2017   Frequent UTI 02/18/2016   Tremor 11/12/2015   Productive cough 11/08/2015   Constipation 04/12/2015   Cystocele, unspecified 04/12/2015   Routine general medical examination at a health care facility 06/26/2014   Osteopenia 06/26/2014  Prediabetes 06/26/2014   Colon cancer screening 06/26/2014   Intertrigo 07/12/2013   Chronic fatigue syndrome 01/19/2013   Elevated alkaline phosphatase level 09/19/2012   Edema 09/16/2012   Chronic vomiting 08/05/2011   Tachycardia 08/05/2011   Diarrhea 04/14/2010   CONSTIPATION, CHRONIC 07/30/2008   Irritable bowel syndrome 07/30/2008   PURE HYPERCHOLESTEROLEMIA 06/21/2008   POSTMENOPAUSAL STATUS 06/21/2008   VAGINITIS, ATROPHIC 06/21/2008   Past Medical History:  Diagnosis Date   Allergic rhinitis    Arthritis    Chronic fatigue syndrome    CFIDs; also chronic joint and mucle pain (possible tik bourne illness?). Dr. Cristy Friedlander    Chronic gastritis    Dyspnea    Hepatic steatosis    History of kidney stones 05/2017   Hyperlipidemia    IBS (irritable bowel syndrome)    Lyme disease    "stari"   Migraine    Muscle  twitching    Narcolepsy    Neuromuscular disorder (HCC)    PONV (postoperative nausea and vomiting)    Rectal prolapse    Schatzki's ring    Serrated adenoma of colon    Tubular adenoma of colon    Umbilical hernia    Vaginal prolapse    West Nile Virus infection 2003   Past Surgical History:  Procedure Laterality Date   5th R toe fx  '09   ABDOMINAL HYSTERECTOMY     BREAST BIOPSY  '04   neg; right   CHOLECYSTECTOMY  10/2011   COLONOSCOPY  12/02/2021   2017- 2- TAs   CYSTOSCOPY WITH STENT PLACEMENT Right 05/25/2017   Procedure: CYSTOSCOPY WITH STENT PLACEMENT;  Surgeon: Hollice Espy, MD;  Location: [facility];  Service: Urology;  Laterality: Right;   CYSTOSCOPY WITH STENT PLACEMENT Right 06/21/2017   Procedure: CYSTOSCOPY WITH STENT EXCHANGE;  Surgeon: Hollice Espy, MD;  Location: [facility];  Service: Urology;  Laterality: Right;   DIAGNOSTIC LAPAROSCOPY     EXTRACORPOREAL SHOCK WAVE LITHOTRIPSY Right 06/10/2017   Procedure: EXTRACORPOREAL SHOCK WAVE LITHOTRIPSY (ESWL);  Surgeon: Hollice Espy, MD;  Location: [facility];  Service: Urology;  Laterality: Right;   HOLMIUM LASER APPLICATION Right 02/77/4128   Procedure: HOLMIUM LASER APPLICATION;  Surgeon: Hollice Espy, MD;  Location: [facility];  Service: Urology;  Laterality: Right;   laprascopy     PARTIAL HYSTERECTOMY     bleeding   TONSILLECTOMY     TONSILLECTOMY     ULNAR NERVE REPAIR     sx times 5   UPPER GASTROINTESTINAL ENDOSCOPY  12/04/2004   gastritis   URETEROSCOPY WITH HOLMIUM LASER LITHOTRIPSY Right 06/21/2017   Procedure: URETEROSCOPY WITH HOLMIUM LASER LITHOTRIPSY;  Surgeon: Hollice Espy, MD;  Location: [facility];  Service: Urology;  Laterality: Right;   Social History   Tobacco Use   Smoking status: Never   Smokeless tobacco: Never   Tobacco comments:    non smoker  Vaping Use   Vaping Use: Never used  Substance Use Topics   Alcohol use: No    Alcohol/week: 0.0 standard drinks of alcohol    Drug use: Yes    Types: Morphine    Comment: history of opiate use abuse   Family History  Problem Relation Age of Onset   Diabetes Father    Hypertension Father    Stroke Father    Cancer Father    Hypertension Mother    Cancer Mother        Waldenstroms   Ulcers Mother    Breast cancer Paternal Grandmother  Bipolar disorder Other        some family   Alcohol abuse Maternal Grandfather    Colon cancer Neg Hx    Esophageal cancer Neg Hx    Pancreatic cancer Neg Hx    Ulcerative colitis Neg Hx    Allergies  Allergen Reactions   Other Nausea Only and Nausea And Vomiting    All Antibiotics   Sulfonamide Derivatives Nausea And Vomiting   Current Outpatient Medications on File Prior to Visit  Medication Sig Dispense Refill   cholecalciferol (VITAMIN D) 1000 units tablet Take 5,000 Units by mouth daily.      docusate sodium (COLACE) 100 MG capsule Take 1 capsule (100 mg total) by mouth 2 (two) times daily. 60 capsule 0   HM MAGNESIUM CITRATE PO Take 1 tablet by mouth daily.     ketoconazole (NIZORAL) 2 % cream Apply 1 application. topically daily. To affected area 30 g 1   Milk Thistle Extract 175 MG TABS Take 1 tablet by mouth daily.     morphine (MSIR) 30 MG tablet Take 30 mg by mouth every 3 (three) hours as needed for severe pain. UAD PRN     Multiple Vitamin (MULTIVITAMIN) tablet Take 1 tablet by mouth daily.     Multiple Vitamins-Minerals (HAIR SKIN AND NAILS FORMULA PO) Take 1 tablet by mouth daily.     nitrofurantoin, macrocrystal-monohydrate, (MACROBID) 100 MG capsule TAKE 1 CAPSULE BY MOUTH EVERY DAY 30 capsule 5   pantoprazole (PROTONIX) 40 MG tablet Take 1 tablet (40 mg total) by mouth 2 (two) times daily before a meal. 60 tablet 11   promethazine (PHENERGAN) 25 MG tablet Take 1 tablet (25 mg total) by mouth 3 (three) times daily as needed for nausea or vomiting. 90 tablet 3   vitamin B-12 (CYANOCOBALAMIN) 1000 MCG tablet Place under the tongue.     vitamin E 180 MG  (400 UNITS) capsule Take 2 capsules (800 Units total) by mouth daily. 180 capsule 3   No current facility-administered medications on file prior to visit.     Review of Systems  Constitutional:  Positive for fatigue. Negative for activity change, appetite change, fever and unexpected weight change.  HENT:  Negative for congestion, ear pain, rhinorrhea, sinus pressure and sore throat.   Eyes:  Negative for pain, redness and visual disturbance.  Respiratory:  Negative for cough, shortness of breath and wheezing.   Cardiovascular:  Negative for chest pain and palpitations.  Gastrointestinal:  Negative for abdominal pain, blood in stool, constipation and diarrhea.  Endocrine: Negative for polydipsia and polyuria.  Genitourinary:  Positive for frequency and urgency. Negative for dysuria.  Musculoskeletal:  Positive for arthralgias, back pain and gait problem.  Skin:  Negative for pallor and rash.  Allergic/Immunologic: Negative for environmental allergies.  Neurological:  Negative for dizziness, syncope and headaches.  Hematological:  Negative for adenopathy. Does not bruise/bleed easily.  Psychiatric/Behavioral:  Negative for decreased concentration and dysphoric mood. The patient is not nervous/anxious.        Objective:   Physical Exam Constitutional:      General: She is not in acute distress.    Appearance: Normal appearance. She is well-developed. She is obese. She is not ill-appearing or diaphoretic.  HENT:     Head: Normocephalic and atraumatic.     Mouth/Throat:     Mouth: Mucous membranes are moist.  Eyes:     General: No scleral icterus.    Conjunctiva/sclera: Conjunctivae normal.     Pupils:  Pupils are equal, round, and reactive to light.  Neck:     Thyroid: No thyromegaly.     Vascular: No carotid bruit or JVD.  Cardiovascular:     Rate and Rhythm: Regular rhythm. Tachycardia present.     Heart sounds: Normal heart sounds. No murmur heard.    No friction rub. No  gallop.  Pulmonary:     Effort: Pulmonary effort is normal. No respiratory distress.     Breath sounds: Normal breath sounds. No stridor. No wheezing, rhonchi or rales.  Abdominal:     General: There is no distension or abdominal bruit.     Palpations: Abdomen is soft.     Tenderness: There is no abdominal tenderness.  Musculoskeletal:     Cervical back: Normal range of motion and neck supple.     Right lower leg: No edema.     Left lower leg: No edema.     Comments: Poor posture with kyphosis and R side lean  Wheel chair bound due to chronic pain   No pedal edema today  Lymphadenopathy:     Cervical: No cervical adenopathy.  Skin:    General: Skin is warm and dry.     Coloration: Skin is not jaundiced or pale.     Findings: No bruising or rash.  Neurological:     Mental Status: She is alert.  Psychiatric:        Mood and Affect: Mood normal.           Assessment & Plan:   Problem List Items Addressed This Visit       Nervous and Auditory   Chronic fatigue syndrome    Ongoing  Lab today          Genitourinary   Female bladder prolapse    Planning urology surgery for sacrocolpopexy, urethral sling Also cystoscopy for h/o uti          Other   CONSTIPATION, CHRONIC    Made worse by her female prolapse Planning surgery Lab today      Relevant Orders   CBC with Differential/Platelet   TSH   Elevated liver enzymes    Lab today      Other chronic pain    No longer going to [facility] She is gradually weaning off of her morphine       Pre-operative cardiovascular examination - Primary    Pt is planning urology surgery on 12/1 with likely general anesthesia  No anesth problems in the past but she does have chronic nausea  Noted allergic to sulfa drugs  Has seen pulmonary for intermittent sob in the past with reassuring w/u and today pulse ox is 97%, PFTs were nl in 2020  No cardiac history  Tends to have rapid hr in doctor office due to  pain /anxiety-this improved during her visit today  Did note mildly short PR on her EKG  Bp is in nl range  She will be mod risk for surgery in light of complex history and immobility  Would benefit from home health afterwards along with slose supervision  Labs for chronic health problems done today       Relevant Orders   EKG 12-Lead (Completed)   EKG 12-Lead (Completed)   Prediabetes    A1c ordered       Relevant Orders   Hemoglobin A1c   Tachycardia    Per pt pulse rate is nl at home generally  Is higher here  Improved after sitting  EKG noted rate of 111 with mildly short PR interval of 112 Pain and exertion tend to raise her pulse in the office No cp or cardiac history        Relevant Orders   CBC with Differential/Platelet   Comprehensive metabolic panel   TSH

## 2022-09-01 NOTE — Assessment & Plan Note (Signed)
Ongoing  Lab today

## 2022-09-01 NOTE — Assessment & Plan Note (Signed)
Made worse by her female prolapse Planning surgery Lab today

## 2022-09-01 NOTE — Assessment & Plan Note (Addendum)
Pt is planning urology surgery on 12/1 with likely general anesthesia  No anesth problems in the past but she does have chronic nausea  Noted allergic to sulfa drugs  Has seen pulmonary for intermittent sob in the past with reassuring w/u and today pulse ox is 97%, PFTs were nl in 2020  No cardiac history  Tends to have rapid hr in doctor office due to pain /anxiety-this improved during her visit today  Did note mildly short PR on her EKG  Bp is in nl range  She will be mod risk for surgery in light of complex history and immobility  Would benefit from home health afterwards along with slose supervision  Labs for chronic health problems done today

## 2022-09-01 NOTE — Patient Instructions (Addendum)
I'm curious to see what your pulse is like at home   Check that and watch your blood pressure   Consider asking for a home health care order from Dr Louis Meckel when you return from surgery since your mobility is less   I do think a flu shot and covid shot are a good idea   Some labs today   I am going to review your record for pulmonary records as well

## 2022-09-01 NOTE — Assessment & Plan Note (Signed)
A1c ordered.

## 2022-09-01 NOTE — Assessment & Plan Note (Signed)
Planning urology surgery for sacrocolpopexy, urethral sling Also cystoscopy for h/o uti

## 2022-09-01 NOTE — Assessment & Plan Note (Signed)
Lab today.

## 2022-09-02 LAB — COMPREHENSIVE METABOLIC PANEL
ALT: 17 IU/L (ref 0–32)
AST: 37 IU/L (ref 0–40)
Albumin/Globulin Ratio: 1 — ABNORMAL LOW (ref 1.2–2.2)
Albumin: 3.5 g/dL — ABNORMAL LOW (ref 3.9–4.9)
Alkaline Phosphatase: 131 IU/L — ABNORMAL HIGH (ref 44–121)
BUN/Creatinine Ratio: 15 (ref 12–28)
BUN: 8 mg/dL (ref 8–27)
Bilirubin Total: 1.2 mg/dL (ref 0.0–1.2)
CO2: 22 mmol/L (ref 20–29)
Calcium: 9.2 mg/dL (ref 8.7–10.3)
Chloride: 102 mmol/L (ref 96–106)
Creatinine, Ser: 0.55 mg/dL — ABNORMAL LOW (ref 0.57–1.00)
Globulin, Total: 3.4 g/dL (ref 1.5–4.5)
Glucose: 98 mg/dL (ref 70–99)
Potassium: 4.1 mmol/L (ref 3.5–5.2)
Sodium: 139 mmol/L (ref 134–144)
Total Protein: 6.9 g/dL (ref 6.0–8.5)
eGFR: 101 mL/min/{1.73_m2} (ref >59)

## 2022-09-02 LAB — CBC WITH DIFFERENTIAL/PLATELET
Basophils Absolute: 0 10*3/uL (ref 0.0–0.2)
Basos: 1 %
EOS (ABSOLUTE): 0.1 10*3/uL (ref 0.0–0.4)
Eos: 2 %
Hematocrit: 30.9 % — ABNORMAL LOW (ref 34.0–46.6)
Hemoglobin: 10.3 g/dL — ABNORMAL LOW (ref 11.1–15.9)
Immature Grans (Abs): 0 10*3/uL (ref 0.0–0.1)
Immature Granulocytes: 0 %
Lymphocytes Absolute: 1.1 10*3/uL (ref 0.7–3.1)
Lymphs: 26 %
MCH: 24.3 pg — ABNORMAL LOW (ref 26.6–33.0)
MCHC: 33.3 g/dL (ref 31.5–35.7)
MCV: 73 fL — ABNORMAL LOW (ref 79–97)
Monocytes Absolute: 0.5 10*3/uL (ref 0.1–0.9)
Monocytes: 11 %
Neutrophils Absolute: 2.4 10*3/uL (ref 1.4–7.0)
Neutrophils: 60 %
Platelets: 192 10*3/uL (ref 150–450)
RBC: 4.23 x10E6/uL (ref 3.77–5.28)
RDW: 14.6 % (ref 11.7–15.4)
WBC: 4 10*3/uL (ref 3.4–10.8)

## 2022-09-02 LAB — TSH: TSH: 2.31 u[IU]/mL (ref 0.450–4.500)

## 2022-09-02 LAB — HEMOGLOBIN A1C
Est. average glucose Bld gHb Est-mCnc: 108 mg/dL
Hgb A1c MFr Bld: 5.4 % (ref 4.8–5.6)

## 2022-09-03 ENCOUNTER — Other Ambulatory Visit: Payer: Self-pay | Admitting: Family Medicine

## 2022-09-03 NOTE — Telephone Encounter (Signed)
Her Hb was unexpectedly 10.3 at her recent labs -this was new  I have not sent in clearance since we need to investigate Please ask if there if Dr Louis Meckel has a personal cut off re: Hb (anemia) to do surgery.

## 2022-09-03 NOTE — Telephone Encounter (Signed)
Spoke to [employer] DR. Herrick's assistant, he was in surgery but she is going to ask if he would still want to proceed with surgery due to Hb of 10.3

## 2022-09-03 NOTE — Telephone Encounter (Signed)
[name] Branch from [facility] with Dr. Olegario Shearer called and stated she has not gotten the surgical clearance forms back yet. [phone] is Fax number, Call back [phone] Ext [phone].

## 2022-09-04 ENCOUNTER — Telehealth: Payer: Self-pay | Admitting: Family Medicine

## 2022-09-04 ENCOUNTER — Encounter: Payer: Self-pay | Admitting: Family Medicine

## 2022-09-04 ENCOUNTER — Telehealth: Payer: Self-pay

## 2022-09-04 DIAGNOSIS — D509 Iron deficiency anemia, unspecified: Secondary | ICD-10-CM | POA: Insufficient documentation

## 2022-09-04 DIAGNOSIS — D500 Iron deficiency anemia secondary to blood loss (chronic): Secondary | ICD-10-CM

## 2022-09-04 MED ORDER — POLYSACCHARIDE IRON COMPLEX 150 MG PO CAPS: 150.0000 mg | ORAL_CAPSULE | Freq: Two times a day (BID) | ORAL | 1 refills | Status: DC

## 2022-09-04 NOTE — Telephone Encounter (Signed)
-----   Message from Tammi Sou, [place] sent at 09/04/2022 11:46 AM EST ----- Pt notified of lab results and Dr. Marliss Coots comments. Pt said about 1-2 days a week she is still having bad abd pain and the rest of the week the pain is manageable. Pt said she hasn't been iron deficient in the past that she is aware of.  Pt wants iron sent to [facility] in [place].

## 2022-09-04 NOTE — Assessment & Plan Note (Signed)
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

## 2022-09-04 NOTE — Telephone Encounter (Signed)
See separate phone note, PCP decided to proceed with IV iron infusion. Pt aware

## 2022-09-04 NOTE — Telephone Encounter (Signed)
I placed the referral   Please cc to the referral pool  Thanks

## 2022-09-04 NOTE — Telephone Encounter (Signed)
-----   Message from Jerene Bears, MD sent at 09/04/2022 12:09 PM EST ----- Roque Lias, What do you think about IV iron given her issues with constipation and abd pain?  Would probably be quicker esp given I think she is scheduled for uro surgery.  Temperence Zenor, Pt needs appt with APP. IDA, recent EGD/colon Should also be on BID PPI at least until being seen. Thanks JMP   ----- Message ----- From: Abner Greenspan, MD Sent: 09/04/2022   8:03 AM EST To: Jerene Bears, MD  This mutual pt is getting ready for some urologic surgery and came up anemic with Hb of 10.3 out of nowhere (unfortunate - she cannot catch a break)  Her EGD in feb noted some non bleeding ulcers  Can we get her in with you or reach out? Thanks so much!

## 2022-09-04 NOTE — Telephone Encounter (Signed)
Pt scheduled to see Alonza Bogus PA 09/09/22 at 10:30am. Pt aware of appt.

## 2022-09-04 NOTE — Telephone Encounter (Signed)
Please let pt know that Dr Hilarie Fredrickson wants to consider IV iron treatment if she qualifies This may be easier on her system since the oral iron can cause GI side eff I would need to refer her to hematology for that - is that ok with her? -- let me know and I will put in an urgent referral    Also it looks like she now has a GI appt with one of the extenders on 11/15   Once she is stable and Hb is improved I can clear for surgery

## 2022-09-04 NOTE — Telephone Encounter (Signed)
Pt notified of Dr. Marliss Coots comments. She had not picked up the oral iron, she is okay with IV iorn treatment and if possible would like to see hematologist in [place].

## 2022-09-04 NOTE — Telephone Encounter (Signed)
I'm sending niferex/ it is also otc It may worsen constipation so if not already taking it start miralax once to three times dialy  It will turn her stool darker, possibly black but this is normal  Keep [place] posted  If there is any way for her to do a heme card before starting that would be great but I know that is hard for her and they live far away so she would need husband to come pick up (let me know)    I expect we will need to get her seen by GI and did cc last note to Dr Hilarie Fredrickson  I will likely go ahead and send a GI referral there   She has had non bleeding ulcers in the past and this could be a cause

## 2022-09-05 ENCOUNTER — Other Ambulatory Visit: Payer: Self-pay | Admitting: Family Medicine

## 2022-09-05 LAB — SPECIMEN STATUS REPORT

## 2022-09-05 LAB — IRON AND TIBC
Iron Saturation: 8 % — CL (ref 15–55)
Iron: 36 ug/dL (ref 27–139)
Total Iron Binding Capacity: 462 ug/dL — ABNORMAL HIGH (ref 250–450)
UIBC: 426 ug/dL — ABNORMAL HIGH (ref 118–369)

## 2022-09-05 LAB — FERRITIN: Ferritin: 11 ng/mL — ABNORMAL LOW (ref 15–150)

## 2022-09-07 ENCOUNTER — Encounter: Payer: Self-pay | Admitting: Family Medicine

## 2022-09-07 NOTE — Telephone Encounter (Signed)
Hospital f/u was on 09/01/22, last filled on 02/10/22 #30 g with 1 refill

## 2022-09-07 NOTE — Telephone Encounter (Signed)
I will call when they open this morning.

## 2022-09-08 ENCOUNTER — Inpatient Hospital Stay: Payer: 59 | Attending: Oncology | Admitting: Oncology

## 2022-09-08 ENCOUNTER — Telehealth: Payer: Self-pay | Admitting: Family Medicine

## 2022-09-08 ENCOUNTER — Encounter: Payer: Self-pay | Admitting: Oncology

## 2022-09-08 ENCOUNTER — Inpatient Hospital Stay: Payer: 59

## 2022-09-08 VITALS — BP 137/66 | HR 116 | Temp 98.9°F

## 2022-09-08 DIAGNOSIS — Z808 Family history of malignant neoplasm of other organs or systems: Secondary | ICD-10-CM | POA: Diagnosis not present

## 2022-09-08 DIAGNOSIS — D509 Iron deficiency anemia, unspecified: Secondary | ICD-10-CM | POA: Diagnosis not present

## 2022-09-08 DIAGNOSIS — Z809 Family history of malignant neoplasm, unspecified: Secondary | ICD-10-CM | POA: Insufficient documentation

## 2022-09-08 DIAGNOSIS — Z803 Family history of malignant neoplasm of breast: Secondary | ICD-10-CM | POA: Diagnosis not present

## 2022-09-08 DIAGNOSIS — Z8052 Family history of malignant neoplasm of bladder: Secondary | ICD-10-CM | POA: Diagnosis not present

## 2022-09-08 DIAGNOSIS — Z801 Family history of malignant neoplasm of trachea, bronchus and lung: Secondary | ICD-10-CM | POA: Diagnosis not present

## 2022-09-08 NOTE — Progress Notes (Signed)
Hematology/Oncology Consult note [phone] [phone]      Patient Care Team: Tower, Wynelle Fanny, MD as PCP - General (Family Medicine)   REFERRING PROVIDER: Abner Greenspan, MD  CHIEF COMPLAINTS/REASON FOR VISIT:  Anemia  ASSESSMENT & PLAN:  IDA (iron deficiency anemia) Labs are reviewed and discussed with patient. I discussed about the option of proceed with IV Venofer treatments. I discussed about the potential risks including but not limited to allergic reactions/infusion reactions including anaphylactic reactions, phlebitis, high blood pressure, wheezing, SOB, skin rash, weight gain, leg swelling, headache, nausea and fatigue, etc. Patient desires to achieved higher level of iron faster for adequate hematopoesis and agrees with the plan. Plan IV venofer x 4 doses.   Recommend patient to follow up with GI for evaluation of etiology of IDA  Orders Placed This Encounter  Procedures   CBC with Differential/Platelet    Standing Status:   Future    Standing Expiration Date:   09/09/2023   Ferritin    Standing Status:   Future    Standing Expiration Date:   09/09/2023   Iron and TIBC    Standing Status:   Future    Standing Expiration Date:   09/09/2023   Retic Panel    Standing Status:   Future    Standing Expiration Date:   09/09/2023   Follow up in 3 months.  All questions were answered. The patient knows to call the clinic with any problems, questions or concerns.  Earlie Server, MD, PhD [facility] Hematology Oncology 09/08/2022     HISTORY OF PRESENTING ILLNESS:  [name] Branch is a  66 y.o.  female with PMH listed below who was referred to me for anemia Reviewed patient's recent labs that was done.  She was found to have ianemia during pre surgery evaluation. 09/01/22 cbc showed Hb of 10.3, ferritin 11, iron saturation 8, TIBC 462.  Reviewed patient's previous labs ordered by primary care physician's office,iron deficiency anemia was new.  +  fatigue.  She denies recent chest pain on exertion, shortness of breath on minimal exertion, pre-syncopal episodes, or palpitations She had not noticed any recent bleeding such as epistaxis, hematuria or hematochezia.   Her last colonoscopy was 01/22/22, EGD was 12/02/21. She was found to have gastritis.  She is going to have bladder prolapse surgery on 09/25/22.    MEDICAL HISTORY:  Past Medical History:  Diagnosis Date   Allergic rhinitis    Arthritis    Chronic fatigue syndrome    CFIDs; also chronic joint and mucle pain (possible tik bourne illness?). Dr. Cristy Friedlander    Chronic gastritis    Dyspnea    Hepatic steatosis    History of kidney stones 05/2017   Hyperlipidemia    IBS (irritable bowel syndrome)    Lyme disease    "stari"   Migraine    Muscle twitching    Narcolepsy    Neuromuscular disorder (HCC)    PONV (postoperative nausea and vomiting)    Rectal prolapse    Schatzki's ring    Umbilical hernia    Vaginal prolapse    West Nile Virus infection 2003    SURGICAL HISTORY: Past Surgical History:  Procedure Laterality Date   5th R toe fx  '09   ABDOMINAL HYSTERECTOMY     BREAST BIOPSY  '04   neg; right   CHOLECYSTECTOMY  10/2011   COLONOSCOPY  12/02/2021   2017- 2- TAs   CYSTOSCOPY WITH STENT PLACEMENT Right 05/25/2017  Procedure: CYSTOSCOPY WITH STENT PLACEMENT;  Surgeon: Hollice Espy, MD;  Location: [facility];  Service: Urology;  Laterality: Right;   CYSTOSCOPY WITH STENT PLACEMENT Right 06/21/2017   Procedure: CYSTOSCOPY WITH STENT EXCHANGE;  Surgeon: Hollice Espy, MD;  Location: [facility];  Service: Urology;  Laterality: Right;   DIAGNOSTIC LAPAROSCOPY     EXTRACORPOREAL SHOCK WAVE LITHOTRIPSY Right 06/10/2017   Procedure: EXTRACORPOREAL SHOCK WAVE LITHOTRIPSY (ESWL);  Surgeon: Hollice Espy, MD;  Location: [facility];  Service: Urology;  Laterality: Right;   HOLMIUM LASER APPLICATION Right 83/15/1761   Procedure: HOLMIUM LASER APPLICATION;  Surgeon:  Hollice Espy, MD;  Location: [facility];  Service: Urology;  Laterality: Right;   laprascopy     PARTIAL HYSTERECTOMY     bleeding   TONSILLECTOMY     TONSILLECTOMY     ULNAR NERVE REPAIR     sx times 5   UPPER GASTROINTESTINAL ENDOSCOPY  12/04/2004   gastritis   URETEROSCOPY WITH HOLMIUM LASER LITHOTRIPSY Right 06/21/2017   Procedure: URETEROSCOPY WITH HOLMIUM LASER LITHOTRIPSY;  Surgeon: Hollice Espy, MD;  Location: [facility];  Service: Urology;  Laterality: Right;    SOCIAL HISTORY: Social History   Socioeconomic History   Marital status: Married    Spouse name: Not on file   Number of children: 0   Years of education: Not on file   Highest education level: Not on file  Occupational History   Occupation: owner  Tobacco Use   Smoking status: Never   Smokeless tobacco: Never   Tobacco comments:    non smoker  Vaping Use   Vaping Use: Never used  Substance and Sexual Activity   Alcohol use: No    Alcohol/week: 0.0 standard drinks of alcohol   Drug use: Yes    Types: Morphine    Comment: history of opiate use abuse   Sexual activity: Not Currently  Other Topics Concern   Not on file  Social History Narrative   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 Married, no children.    Social Determinants of Health   Financial Resource Strain: Low Risk  (05/14/2022)   Overall Financial Resource Strain (CARDIA)    Difficulty of Paying Living Expenses: Not hard at all  Food Insecurity: No Food Insecurity (05/14/2022)   Hunger Vital Sign    Worried About Running Out of Food in the Last Year: Never true    Ran Out of Food in the Last Year: Never true  Transportation Needs: No Transportation Needs (05/14/2022)   PRAPARE - Hydrologist (Medical): No    Lack of Transportation (Non-Medical): No  Physical Activity: Inactive (05/14/2022)   Exercise Vital Sign    Days of Exercise per Week: 0 days    Minutes of Exercise per Session: 0 min  Stress: No Stress Concern  Present (05/14/2022)   [place]    Feeling of Stress : Not at all  Social Connections: Not on file  Intimate Partner Violence: Not on file    FAMILY HISTORY: Family History  Problem Relation Age of Onset   Hypertension Mother    Cancer Mother        Waldenstroms   Ulcers Mother    Diabetes Father    Hypertension Father    Stroke Father    Cancer Father    Throat cancer Father    Bladder Cancer Father    Breast cancer Sister    Alcohol abuse Maternal Grandfather  Breast cancer Paternal Grandmother    Lung cancer Paternal Grandfather    Bipolar disorder Other        some family   Colon cancer Neg Hx    Esophageal cancer Neg Hx    Pancreatic cancer Neg Hx    Ulcerative colitis Neg Hx     ALLERGIES:  is allergic to other and sulfonamide derivatives.  MEDICATIONS:  Current Outpatient Medications  Medication Sig Dispense Refill   cholecalciferol (VITAMIN D) 1000 units tablet Take 5,000 Units by mouth daily.      docusate sodium (COLACE) 100 MG capsule Take 1 capsule (100 mg total) by mouth 2 (two) times daily. 60 capsule 0   HM MAGNESIUM CITRATE PO Take 1 tablet by mouth daily.     ketoconazole (NIZORAL) 2 % cream APPLY 1 APPLICATION. TOPICALLY DAILY. TO AFFECTED AREA 30 g 1   Milk Thistle Extract 175 MG TABS Take 1 tablet by mouth daily.     morphine (MSIR) 30 MG tablet Take 30 mg by mouth every 3 (three) hours as needed for severe pain. UAD PRN     Multiple Vitamin (MULTIVITAMIN) tablet Take 1 tablet by mouth daily.     Multiple Vitamins-Minerals (HAIR SKIN AND NAILS FORMULA PO) Take 1 tablet by mouth daily.     nitrofurantoin, macrocrystal-monohydrate, (MACROBID) 100 MG capsule TAKE 1 CAPSULE BY MOUTH EVERY DAY 30 capsule 5   pantoprazole (PROTONIX) 40 MG tablet Take 1 tablet (40 mg total) by mouth 2 (two) times daily before a meal. 60 tablet 11   promethazine (PHENERGAN) 25 MG tablet Take 1 tablet (25 mg  total) by mouth 3 (three) times daily as needed for nausea or vomiting. 90 tablet 3   vitamin B-12 (CYANOCOBALAMIN) 1000 MCG tablet Place under the tongue.     vitamin E 180 MG (400 UNITS) capsule Take 2 capsules (800 Units total) by mouth daily. 180 capsule 3   iron polysaccharides (NIFEREX) 150 MG capsule Take 1 capsule (150 mg total) by mouth 2 (two) times daily. With food (Patient not taking: Reported on 09/08/2022) 60 capsule 1   No current facility-administered medications for this visit.    Review of Systems  Constitutional:  Positive for fatigue. Negative for appetite change, chills and fever.  HENT:   Negative for hearing loss and voice change.   Eyes:  Negative for eye problems.  Respiratory:  Negative for chest tightness and cough.   Cardiovascular:  Negative for chest pain.  Gastrointestinal:  Negative for abdominal distention, abdominal pain and blood in stool.  Endocrine: Negative for hot flashes.  Genitourinary:  Negative for difficulty urinating and frequency.   Musculoskeletal:  Negative for arthralgias.  Skin:  Negative for itching and rash.  Neurological:  Negative for extremity weakness.  Hematological:  Negative for adenopathy.  Psychiatric/Behavioral:  Negative for confusion.     PHYSICAL EXAMINATION: Vitals:   09/08/22 1459  BP: 137/66  Pulse: (!) 116  Temp: 98.9 F (37.2 C)  SpO2: 95%   There were no vitals filed for this visit.  Physical Exam Constitutional:      General: She is not in acute distress.    Appearance: She is obese.  HENT:     Head: Normocephalic and atraumatic.  Eyes:     General: No scleral icterus. Cardiovascular:     Rate and Rhythm: Normal rate.  Pulmonary:     Effort: Pulmonary effort is normal. No respiratory distress.     Breath sounds: No wheezing.  Abdominal:  General: Bowel sounds are normal. There is no distension.     Palpations: Abdomen is soft.  Musculoskeletal:        General: Normal range of motion.      Cervical back: Normal range of motion and neck supple.  Skin:    General: Skin is warm.     Findings: No erythema or rash.  Neurological:     Mental Status: She is alert and oriented to person, place, and time. Mental status is at baseline.     Cranial Nerves: No cranial nerve deficit.  Psychiatric:        Mood and Affect: Mood normal.      LABORATORY DATA:  I have reviewed the data as listed    Latest Ref Rng & Units 09/01/2022    4:08 PM 11/27/2021    4:06 AM 11/26/2021    3:50 PM  CBC  WBC 3.4 - 10.8 x10E3/uL 4.0  4.7  3.7   Hemoglobin 11.1 - 15.9 g/dL 10.3  12.3  13.2   Hematocrit 34.0 - 46.6 % 30.9  38.9  41.7   Platelets 150 - 450 x10E3/uL 192  171  160       Latest Ref Rng & Units 09/01/2022    4:08 PM 11/27/2021    4:06 AM 11/26/2021    3:50 PM  CMP  Glucose 70 - 99 mg/dL 98  116  116   BUN 8 - 27 mg/dL '8  7  8   '$ Creatinine 0.57 - 1.00 mg/dL 0.55  0.69  0.61   Sodium 134 - 144 mmol/L 139  137  137   Potassium 3.5 - 5.2 mmol/L 4.1  3.5  3.3   Chloride 96 - 106 mmol/L 102  101  104   CO2 20 - 29 mmol/L '22  29  25   '$ Calcium 8.7 - 10.3 mg/dL 9.2  8.6  9.2   Total Protein 6.0 - 8.5 g/dL 6.9   6.8   Total Bilirubin 0.0 - 1.2 mg/dL 1.2   2.1   Alkaline Phos 44 - 121 IU/L 131   87   AST 0 - 40 IU/L 37   56   ALT 0 - 32 IU/L 17   27       Component Value Date/Time   IRON 36 09/01/2022 1608   TIBC 462 (H) 09/01/2022 1608   FERRITIN 11 (L) 09/01/2022 1608   IRONPCTSAT 8 (LL) 09/01/2022 1608     RADIOGRAPHIC STUDIES: I have personally reviewed the radiological images as listed and agreed with the findings in the report. No results found.

## 2022-09-08 NOTE — Telephone Encounter (Signed)
Zona from [facility] called and stated hemoglobin was 10.3 and surgery clearance is needed. Call back number [phone]

## 2022-09-08 NOTE — Assessment & Plan Note (Addendum)
Labs are reviewed and discussed with patient. I discussed about the option of proceed with IV Venofer treatments. I discussed about the potential risks including but not limited to allergic reactions/infusion reactions including anaphylactic reactions, phlebitis, high blood pressure, wheezing, SOB, skin rash, weight gain, leg swelling, headache, nausea and fatigue, etc. Patient desires to achieved higher level of iron faster for adequate hematopoesis and agrees with the plan. Plan IV venofer x 4 doses.   Recommend patient to follow up with GI for evaluation of etiology of IDA

## 2022-09-08 NOTE — Telephone Encounter (Signed)
Called Zona from [facility] back she did not answer I left a msg on her VM

## 2022-09-08 NOTE — Progress Notes (Signed)
Patient is referred here by Dr. Glori Bickers for IDA. She states that she has had some off and on dizziness for a few weeks. Patient has also had more fatigue lately. Patient had a recent endoscopy in March and was found to have stomach ulcers.

## 2022-09-08 NOTE — Telephone Encounter (Signed)
Pt has been informed. I called [facility] at [facility] back twice no answer LMOM to CB

## 2022-09-08 NOTE — Telephone Encounter (Signed)
Please let [facility] know that I cannot clear her until she is seen by hematology and GI regarding the anemia  I doubt Dr Louis Meckel would want to do surgery with Hb that low  Unfortunately that may mean putting off her surgery  I still have the paperwork on my desk   Let pt know as well  I will cc her urologist/surgeon

## 2022-09-08 NOTE — Telephone Encounter (Signed)
Called pt but she states you have already done pre op clearance for [facility].

## 2022-09-09 ENCOUNTER — Ambulatory Visit (INDEPENDENT_AMBULATORY_CARE_PROVIDER_SITE_OTHER): Payer: 59 | Admitting: Gastroenterology

## 2022-09-09 ENCOUNTER — Encounter: Payer: Self-pay | Admitting: Gastroenterology

## 2022-09-09 ENCOUNTER — Other Ambulatory Visit (INDEPENDENT_AMBULATORY_CARE_PROVIDER_SITE_OTHER): Payer: 59

## 2022-09-09 ENCOUNTER — Encounter: Payer: Self-pay | Admitting: Family Medicine

## 2022-09-09 VITALS — BP 150/64 | HR 84 | Ht 66.0 in | Wt 175.0 lb

## 2022-09-09 DIAGNOSIS — D509 Iron deficiency anemia, unspecified: Secondary | ICD-10-CM

## 2022-09-09 DIAGNOSIS — K760 Fatty (change of) liver, not elsewhere classified: Secondary | ICD-10-CM

## 2022-09-09 LAB — PROTIME-INR
INR: 1.2 ratio — ABNORMAL HIGH (ref 0.8–1.0)
Prothrombin Time: 13.5 s — ABNORMAL HIGH (ref 9.6–13.1)

## 2022-09-09 MED FILL — Iron Sucrose Inj 20 MG/ML (Fe Equiv): INTRAVENOUS | Qty: 10 | Status: AC

## 2022-09-09 NOTE — Progress Notes (Signed)
09/09/2022 [name] Branch [id number] [birth year]   HISTORY OF PRESENT ILLNESS: This is a 66 year old female who is a patient of Dr. Vena Rua.  She is here today at the request of hematology/oncology for evaluation of iron deficiency anemia.  She was just seen by them yesterday.  Her iron levels are low.  Hemoglobin is down to 10.3 g as compared to 12.3 g back in February.  She is being treated with IV Venofer x4 and then also likely some oral Niferex as well following that.  She denies any black or bloody stools.  Has some dizziness and fatigue.  She is on pantoprazole 40 mg twice daily.  Of note, she has been on Macrobid for prevention of UTIs for about the past 2 years.  Colonoscopy 12/2021:  - Two 4 to 6 mm polyps in the cecum, removed with a cold snare. Resected and retrieved. - One 5 mm polyp in the ascending colon, removed with a cold snare. Resected and retrieved. - One 2 mm polyp in the ascending colon, removed with a cold biopsy forceps. Resected and retrieved. retrieved. - A tattoo was seen in the ascending colon. A post-polypectomy scar was found at the tattoo site. There was no evidence of residual polyp tissue. - One 4 mm polyp in the transverse colon, removed with a cold snare. Resected and retrieved  1. Surgical [P], colon, cecum, polyp (2) HYPERPLASTIC POLYPS WITH UNDERLYING HYPERPLASTIC LYMPHOID NODULES. NEGATIVE FOR DYSPLASIA. 2. Surgical [P], colon, ascending, polyp (2) TUBULAR ADENOMA AND A HYPERPLASTIC POLYP. NEGATIVE FOR HIGH-GRADE DYSPLASIA. 3. Surgical [P], colon, transverse, polyp (1) HYPERPLASTIC POLYP. NEGATIVE FOR DYSPLASIA  EGD 11/2021:  - Esophageal mucosal changes were present, including congestion (edema) and granularity. Findings are suggestive of reflux inflammation. Biopsied. - Low-grade of narrowing Schatzki ring. - Gastritis. Biopsied. - Non-bleeding duodenal ulcers. - Duodenal diverticula. - Normal second portion of the duodenum.  Biopsied.  1. Surgical [P], duodenal - DUODENAL MUCOSA WITH REACTIVE CHANGES. SEE NOTE - NEGATIVE FOR INCREASED INTRAEPITHELIAL LYMPHOCYTES OR DISTORTED VILLOUS ARCHITECTURE 2. Surgical [P], gastric - GASTRIC ANTRAL AND OXYNTIC MUCOSA WITH MILD CHRONIC GASTRITIS. SEE NOTE 3. Surgical [P], distal esophagus - ESOPHAGEAL SQUAMOUS MUCOSA WITH MILD VASCULAR CONGESTION, AND FOCAL SQUAMOUS BALLOONING, SUGGESTIVE OF REFLUX ESOPHAGITIS - NEGATIVE FOR INCREASED INTRAEPITHELIAL EOSINOPHILS   Past Medical History:  Diagnosis Date   Allergic rhinitis    Arthritis    Chronic fatigue syndrome    CFIDs; also chronic joint and mucle pain (possible tik bourne illness?). Dr. Cristy Friedlander    Chronic gastritis    Dyspnea    Hepatic steatosis    History of kidney stones 05/2017   Hyperlipidemia    IBS (irritable bowel syndrome)    Lyme disease    "stari"   Migraine    Muscle twitching    Narcolepsy    Neuromuscular disorder (HCC)    PONV (postoperative nausea and vomiting)    Rectal prolapse    Schatzki's ring    Umbilical hernia    Vaginal prolapse    West Nile Virus infection 2003   Past Surgical History:  Procedure Laterality Date   5th R toe fx  '09   ABDOMINAL HYSTERECTOMY     BREAST BIOPSY  '04   neg; right   CHOLECYSTECTOMY  10/2011   COLONOSCOPY  12/02/2021   2017- 2- TAs   CYSTOSCOPY WITH STENT PLACEMENT Right 05/25/2017   Procedure: CYSTOSCOPY WITH STENT PLACEMENT;  Surgeon: Hollice Espy, MD;  Location: [facility];  Service: Urology;  Laterality: Right;   CYSTOSCOPY WITH STENT PLACEMENT Right 06/21/2017   Procedure: CYSTOSCOPY WITH STENT EXCHANGE;  Surgeon: Hollice Espy, MD;  Location: [facility];  Service: Urology;  Laterality: Right;   DIAGNOSTIC LAPAROSCOPY     EXTRACORPOREAL SHOCK WAVE LITHOTRIPSY Right 06/10/2017   Procedure: EXTRACORPOREAL SHOCK WAVE LITHOTRIPSY (ESWL);  Surgeon: Hollice Espy, MD;  Location: [facility];  Service: Urology;  Laterality: Right;   HOLMIUM  LASER APPLICATION Right 36/64/4034   Procedure: HOLMIUM LASER APPLICATION;  Surgeon: Hollice Espy, MD;  Location: [facility];  Service: Urology;  Laterality: Right;   laprascopy     PARTIAL HYSTERECTOMY     bleeding   TONSILLECTOMY     TONSILLECTOMY     ULNAR NERVE REPAIR     sx times 5   UPPER GASTROINTESTINAL ENDOSCOPY  12/04/2004   gastritis   URETEROSCOPY WITH HOLMIUM LASER LITHOTRIPSY Right 06/21/2017   Procedure: URETEROSCOPY WITH HOLMIUM LASER LITHOTRIPSY;  Surgeon: Hollice Espy, MD;  Location: [facility];  Service: Urology;  Laterality: Right;    reports that she has never smoked. She has never used smokeless tobacco. She reports current drug use. Drug: Morphine. She reports that she does not drink alcohol. family history includes Alcohol abuse in her maternal grandfather; Bipolar disorder in an other family member; Bladder Cancer in her father; Breast cancer in her paternal grandmother and sister; Cancer in her father and mother; Diabetes in her father; Hypertension in her father and mother; Lung cancer in her paternal grandfather; Stroke in her father; Throat cancer in her father; Ulcers in her mother. Allergies  Allergen Reactions   Other Nausea Only and Nausea And Vomiting    All Antibiotics   Sulfonamide Derivatives Nausea And Vomiting      Outpatient Encounter Medications as of 09/09/2022  Medication Sig   cholecalciferol (VITAMIN D) 1000 units tablet Take 5,000 Units by mouth daily.    docusate sodium (COLACE) 100 MG capsule Take 1 capsule (100 mg total) by mouth 2 (two) times daily.   HM MAGNESIUM CITRATE PO Take 1 tablet by mouth daily.   ketoconazole (NIZORAL) 2 % cream APPLY 1 APPLICATION. TOPICALLY DAILY. TO AFFECTED AREA   Milk Thistle Extract 175 MG TABS Take 1 tablet by mouth daily.   morphine (MSIR) 30 MG tablet Take 30 mg by mouth every 3 (three) hours as needed for severe pain. UAD PRN   Multiple Vitamin (MULTIVITAMIN) tablet Take 1 tablet by mouth daily.    Multiple Vitamins-Minerals (HAIR SKIN AND NAILS FORMULA PO) Take 1 tablet by mouth daily.   nitrofurantoin, macrocrystal-monohydrate, (MACROBID) 100 MG capsule TAKE 1 CAPSULE BY MOUTH EVERY DAY   pantoprazole (PROTONIX) 40 MG tablet Take 1 tablet (40 mg total) by mouth 2 (two) times daily before a meal.   promethazine (PHENERGAN) 25 MG tablet Take 1 tablet (25 mg total) by mouth 3 (three) times daily as needed for nausea or vomiting.   vitamin B-12 (CYANOCOBALAMIN) 1000 MCG tablet Place under the tongue.   vitamin E 180 MG (400 UNITS) capsule Take 2 capsules (800 Units total) by mouth daily.   iron polysaccharides (NIFEREX) 150 MG capsule Take 1 capsule (150 mg total) by mouth 2 (two) times daily. With food (Patient not taking: Reported on 09/09/2022)   No facility-administered encounter medications on file as of 09/09/2022.     REVIEW OF SYSTEMS  : All other systems reviewed and negative except where noted in the History of Present Illness.   PHYSICAL EXAM: BP Marland Kitchen)  150/64   Pulse 84   Ht '5\' 6"'$  (1.676 m)   Wt 175 lb (79.4 kg) Comment: per pt in wheel chair  BMI 28.25 kg/m  General: Well developed white female in no acute distress; in wheelchair Head: Normocephalic and atraumatic Eyes:  Sclerae anicteric, conjunctiva pink. Ears: Normal auditory acuity Lungs: Clear throughout to auscultation; no W/R/R. Heart: Regular rate and rhythm; no M/R/G Musculoskeletal: Symmetrical with no gross deformities  Skin: No lesions on visible extremities Neurological: Alert oriented x 4, grossly non-focal Psychological:  Alert and cooperative. Normal mood and affect  ASSESSMENT AND PLAN: *Iron deficiency anemia: Just saw hematology yesterday.  Had an EGD and colonoscopy both earlier this year.  Anemia is new compared to February of this year.  Hemoglobin was 12.3 grams then and now down to 10.3 g with low iron studies.  Is going to receive IV iron treatment with Venofer x4.  She denies seeing any  black or bloody stools.  We will plan to check Hemoccult cards x3.  If negative may just continue to observe for now see how she responds to the iron therapy.  If positive may need to consider video capsule endoscopy to evaluate the small bowel rule out AVMs, etc.  Wonder if the [facility], which she has been on for the past 2 years could be contributing to her anemia.  Does not cause iron deficiency, but can cause other types of anemia.  CC:  Tower, Wynelle Fanny, MD

## 2022-09-09 NOTE — Patient Instructions (Signed)
Your provider has requested that you go to the basement level for lab work before leaving today. Press "B" on the elevator. The lab is located at the first door on the left as you exit the elevator.  Follow the instructions on the Hemoccult cards and mail them back to [place] when you are finished or you may take them directly to the lab in the basement of the [place] building. We will call you with the results.   The [facility] providers would like to encourage you to use [facility] to communicate with providers for non-urgent requests or questions.  Due to long hold times on the telephone, sending your provider a message by [employer] may be a faster and more efficient way to get a response.  Please allow 48 business hours for a response.  Please remember that this is for non-urgent requests.   Due to recent changes in healthcare laws, you may see the results of your imaging and laboratory studies on MyChart before your provider has had a chance to review them.  We understand that in some cases there may be results that are confusing or concerning to you. Not all laboratory results come back in the same time frame and the provider may be waiting for multiple results in order to interpret others.  Please give [place] 48 hours in order for your provider to thoroughly review all the results before contacting the office for clarification of your results.

## 2022-09-10 ENCOUNTER — Inpatient Hospital Stay: Payer: 59

## 2022-09-10 VITALS — BP 122/55 | HR 85 | Temp 98.5°F | Resp 16

## 2022-09-10 DIAGNOSIS — D500 Iron deficiency anemia secondary to blood loss (chronic): Secondary | ICD-10-CM

## 2022-09-10 DIAGNOSIS — D509 Iron deficiency anemia, unspecified: Secondary | ICD-10-CM | POA: Diagnosis not present

## 2022-09-10 MED ORDER — SODIUM CHLORIDE 0.9 % IV SOLN
Freq: Once | INTRAVENOUS | Status: AC
  Filled 2022-09-10: qty 250

## 2022-09-10 MED ORDER — SODIUM CHLORIDE 0.9 % IV SOLN
200.0000 mg | Freq: Once | INTRAVENOUS | Status: AC
  Administered 2022-09-10: 200 mg via INTRAVENOUS
  Filled 2022-09-10: qty 200

## 2022-09-10 NOTE — Patient Instructions (Addendum)
SURGICAL WAITING ROOM VISITATION Patients having surgery or a procedure may have no more than 2 support people in the waiting area - these visitors may rotate.   Children under the age of 51 must have an adult with them who is not the patient. If the patient needs to stay at the hospital during part of their recovery, the visitor guidelines for inpatient rooms apply. Pre-op nurse will coordinate an appropriate time for 1 support person to accompany patient in pre-op.  This support person may not rotate.    Please refer to the [facility] website for the visitor guidelines for Inpatients (after your surgery is over and you are in a regular room).      Your procedure is scheduled on: 09-25-22   Report to [facility] ([facility] Main Entrance    Report to admitting at 5:15 AM   Call this number if you have problems the morning of surgery [phone]   Follow a clear liquid diet the day before surgery   Do not eat food or drink liquids :After Midnight.          If you have questions, please contact your surgeon's office.   FOLLOW BOWEL PREP AND ANY ADDITIONAL PRE OP INSTRUCTIONS YOU RECEIVED FROM YOUR SURGEON'S OFFICE!!!  -Magnesium citrate 8 oz at noon the day before surgery      Oral Hygiene is also important to reduce your risk of infection.                                    Remember - BRUSH YOUR TEETH THE MORNING OF SURGERY WITH YOUR REGULAR TOOTHPASTE   Do NOT smoke after Midnight   Take these medicines the morning of surgery with A SIP OF WATER:   Morphine  Nitrofurantoin  Pantoprazole  Promethazine if needed                              You may not have any metal on your body including hair pins, jewelry, and body piercing             Do not wear make-up, lotions, powders, perfumes or deodorant  Do not wear nail polish including gel and S&S, artificial/acrylic nails, or any other type of covering on natural nails including finger and toenails. If you have artificial nails,  gel coating, etc. that needs to be removed by a nail salon please have this removed prior to surgery or surgery may need to be canceled/ delayed if the surgeon/ anesthesia feels like they are unable to be safely monitored.   Do not shave  48 hours prior to surgery.    Do not bring valuables to the hospital. [place].   Contacts, dentures or bridgework may not be worn into surgery.   Bring small overnight bag day of surgery.   DO NOT [place]. PHARMACY WILL DISPENSE MEDICATIONS LISTED ON YOUR MEDICATION LIST TO YOU DURING YOUR ADMISSION [place]!     Special Instructions: Bring a copy of your healthcare power of attorney and living will documents the day of surgery if you haven't scanned them before.              Please read over the following fact sheets you were given: IF YOU HAVE QUESTIONS ABOUT YOUR PRE-OP INSTRUCTIONS  PLEASE CALL [phone] [place]  If you received a COVID test during your pre-op visit  it is requested that you wear a mask when out in public, stay away from anyone that may not be feeling well and notify your surgeon if you develop symptoms. If you test positive for Covid or have been in contact with anyone that has tested positive in the last 10 days please notify you surgeon.   - Preparing for Surgery Before surgery, you can play an important role.  Because skin is not sterile, your skin needs to be as free of germs as possible.  You can reduce the number of germs on your skin by washing with CHG (chlorahexidine gluconate) soap before surgery.  CHG is an antiseptic cleaner which kills germs and bonds with the skin to continue killing germs even after washing. Please DO NOT use if you have an allergy to CHG or antibacterial soaps.  If your skin becomes reddened/irritated stop using the CHG and inform your nurse when you arrive at Short Stay. Do not shave (including legs and underarms)  for at least 48 hours prior to the first CHG shower.  You may shave your face/neck.  Please follow these instructions carefully:  1.  Shower with CHG Soap the night before surgery and the  morning of surgery.  2.  If you choose to wash your hair, wash your hair first as usual with your normal  shampoo.  3.  After you shampoo, rinse your hair and body thoroughly to remove the shampoo.                             4.  Use CHG as you would any other liquid soap.  You can apply chg directly to the skin and wash.  Gently with a scrungie or clean washcloth.  5.  Apply the CHG Soap to your body ONLY FROM THE NECK DOWN.   Do   not use on face/ open                           Wound or open sores. Avoid contact with eyes, ears mouth and   genitals (private parts).                       Wash face,  Genitals (private parts) with your normal soap.             6.  Wash thoroughly, paying special attention to the area where your    surgery  will be performed.  7.  Thoroughly rinse your body with warm water from the neck down.  8.  DO NOT shower/wash with your normal soap after using and rinsing off the CHG Soap.                9.  Pat yourself dry with a clean towel.            10.  Wear clean pajamas.            11.  Place clean sheets on your bed the night of your first shower and do not  sleep with pets. Day of Surgery : Do not apply any lotions/deodorants the morning of surgery.  Please wear clean clothes to the hospital/surgery center.  FAILURE TO FOLLOW THESE INSTRUCTIONS MAY RESULT IN THE CANCELLATION OF YOUR SURGERY  PATIENT SIGNATURE_________________________________  NURSE SIGNATURE__________________________________  ________________________________________________________________________   WHAT IS A BLOOD TRANSFUSION? Blood Transfusion Information  A transfusion is the replacement of blood or some of its parts. Blood is made up of multiple cells which provide different functions. Red blood  cells carry oxygen and are used for blood loss replacement. White blood cells fight against infection. Platelets control bleeding. Plasma helps clot blood. Other blood products are available for specialized needs, such as hemophilia or other clotting disorders. BEFORE THE TRANSFUSION  Who gives blood for transfusions?  Healthy volunteers who are fully evaluated to make sure their blood is safe. This is blood bank blood. Transfusion therapy is the safest it has ever been in the practice of medicine. Before blood is taken from a donor, a complete history is taken to make sure that person has no history of diseases nor engages in risky social behavior (examples are intravenous drug use or sexual activity with multiple partners). The donor's travel history is screened to minimize risk of transmitting infections, such as malaria. The donated blood is tested for signs of infectious diseases, such as HIV and hepatitis. The blood is then tested to be sure it is compatible with you in order to minimize the chance of a transfusion reaction. If you or a relative donates blood, this is often done in anticipation of surgery and is not appropriate for emergency situations. It takes many days to process the donated blood. RISKS AND COMPLICATIONS Although transfusion therapy is very safe and saves many lives, the main dangers of transfusion include:  Getting an infectious disease. Developing a transfusion reaction. This is an allergic reaction to something in the blood you were given. Every precaution is taken to prevent this. The decision to have a blood transfusion has been considered carefully by your caregiver before blood is given. Blood is not given unless the benefits outweigh the risks. AFTER THE TRANSFUSION Right after receiving a blood transfusion, you will usually feel much better and more energetic. This is especially true if your red blood cells have gotten low (anemic). The transfusion raises the level  of the red blood cells which carry oxygen, and this usually causes an energy increase. The nurse administering the transfusion will monitor you carefully for complications. HOME CARE INSTRUCTIONS  No special instructions are needed after a transfusion. You may find your energy is better. Speak with your caregiver about any limitations on activity for underlying diseases you may have. SEEK MEDICAL CARE IF:  Your condition is not improving after your transfusion. You develop redness or irritation at the intravenous (IV) site. SEEK IMMEDIATE MEDICAL CARE IF:  Any of the following symptoms occur over the next 12 hours: Shaking chills. You have a temperature by mouth above 102 F (38.9 C), not controlled by medicine. Chest, back, or muscle pain. People around you feel you are not acting correctly or are confused. Shortness of breath or difficulty breathing. Dizziness and fainting. You get a rash or develop hives. You have a decrease in urine output. Your urine turns a dark color or changes to pink, red, or brown. Any of the following symptoms occur over the next 10 days: You have a temperature by mouth above 102 F (38.9 C), not controlled by medicine. Shortness of breath. Weakness after normal activity. The white part of the eye turns yellow (jaundice). You have a decrease in the amount of urine or are urinating less often. Your urine turns a dark color or changes to pink, red, or brown. Document Released: 10/09/2000 Document Revised: 01/04/2012 Document  Reviewed: 05/28/2008 ExitCare Patient Information 2014 [place], [place].  _______________________________________________________________________

## 2022-09-10 NOTE — Progress Notes (Addendum)
COVID Vaccine Completed:  Yes  Date of COVID positive in last 90 days:  No  PCP - Loura Pardon, MD Cardiologist - Loralie Champagne in 2009 [place], MD  Chest x-ray - 11-26-21 Epic EKG - 09-01-22 Epic Stress Test - N/A ECHO - 2020 Epic Cardiac Cath - N/A Pacemaker/ICD device last checked: Spinal Cord Stimulator:  N/A  Bowel Prep - Mad Citrate  Sleep Study - N/A CPAP -   Prediabetes Fasting Blood Sugar -  Checks Blood Sugar - does not check   Last dose of GLP1 agonist-  N/A GLP1 instructions:  N/A   Last dose of SGLT-2 inhibitors-  N/A SGLT-2 instructions: N/A  Blood Thinner Instructions:  N/A Aspirin Instructions: Last Dose:  Activity level:  Difficulty climbing stairs due to back pain.  Able to perform activities of daily living without symptoms of chest pain.  Patient states that at times she has dyspnea with exertion she thinks due to deconditioning.  Not able to be active due to her back.    Anesthesia review:   Hx of dyspnea and tachycardia   Patient denies shortness of breath, fever, cough and chest pain at PAT appointment  Patient verbalized understanding of instructions that were given to them at the PAT appointment. Patient was also instructed that they will need to review over the PAT instructions again at home before surgery.

## 2022-09-10 NOTE — Patient Instructions (Signed)

## 2022-09-11 ENCOUNTER — Other Ambulatory Visit: Payer: Self-pay

## 2022-09-11 ENCOUNTER — Encounter (HOSPITAL_COMMUNITY)
Admission: RE | Admit: 2022-09-11 | Discharge: 2022-09-11 | Disposition: A | Discharge status: Home / Self Care | Payer: 59 | Source: Ambulatory Visit | Attending: Urology | Admitting: Urology

## 2022-09-11 ENCOUNTER — Encounter (HOSPITAL_COMMUNITY): Payer: Self-pay

## 2022-09-11 VITALS — BP 148/70 | HR 100 | Temp 99.1°F | Resp 16 | Ht 66.0 in | Wt 174.0 lb

## 2022-09-11 DIAGNOSIS — R7303 Prediabetes: Secondary | ICD-10-CM

## 2022-09-11 DIAGNOSIS — Z1389 Encounter for screening for other disorder: Secondary | ICD-10-CM | POA: Insufficient documentation

## 2022-09-11 DIAGNOSIS — R8290 Unspecified abnormal findings in urine: Secondary | ICD-10-CM | POA: Diagnosis not present

## 2022-09-11 DIAGNOSIS — Z01812 Encounter for preprocedural laboratory examination: Secondary | ICD-10-CM | POA: Insufficient documentation

## 2022-09-11 HISTORY — DX: Anemia, unspecified: D64.9

## 2022-09-11 HISTORY — DX: Gastro-esophageal reflux disease without esophagitis: K21.9

## 2022-09-11 HISTORY — DX: Prediabetes: R73.03

## 2022-09-11 HISTORY — DX: Pneumonia, unspecified organism: J18.9

## 2022-09-11 LAB — TYPE AND SCREEN
ABO/RH(D): O POS
Antibody Screen: NEGATIVE

## 2022-09-11 LAB — GLUCOSE, CAPILLARY: Glucose-Capillary: 102 mg/dL — ABNORMAL HIGH (ref 70–99)

## 2022-09-12 NOTE — Progress Notes (Signed)
Addendum: Reviewed and agree with assessment and management plan. If FOBT + then proceed with VCE. Girtha Kilgore, Lajuan Lines, MD

## 2022-09-14 LAB — URINE CULTURE: Culture: >=100000 — AB

## 2022-09-14 MED FILL — Iron Sucrose Inj 20 MG/ML (Fe Equiv): INTRAVENOUS | Qty: 10 | Status: AC

## 2022-09-14 NOTE — Progress Notes (Signed)
Urine culture results sent to Dr. Louis Meckel to review.

## 2022-09-15 ENCOUNTER — Other Ambulatory Visit (HOSPITAL_COMMUNITY): Payer: Medicare Other

## 2022-09-15 ENCOUNTER — Inpatient Hospital Stay: Payer: 59

## 2022-09-15 VITALS — BP 128/60 | HR 97 | Temp 98.0°F | Resp 18

## 2022-09-15 DIAGNOSIS — D509 Iron deficiency anemia, unspecified: Secondary | ICD-10-CM | POA: Diagnosis not present

## 2022-09-15 DIAGNOSIS — D500 Iron deficiency anemia secondary to blood loss (chronic): Secondary | ICD-10-CM

## 2022-09-15 MED ORDER — SODIUM CHLORIDE 0.9 % IV SOLN
200.0000 mg | Freq: Once | INTRAVENOUS | Status: AC
  Administered 2022-09-15: 200 mg via INTRAVENOUS
  Filled 2022-09-15: qty 200

## 2022-09-15 MED ORDER — SODIUM CHLORIDE 0.9 % IV SOLN
Freq: Once | INTRAVENOUS | Status: AC
  Filled 2022-09-15: qty 250

## 2022-09-19 ENCOUNTER — Encounter: Payer: Self-pay | Admitting: Family Medicine

## 2022-09-21 ENCOUNTER — Inpatient Hospital Stay: Payer: 59

## 2022-09-21 VITALS — BP 133/54 | HR 94 | Temp 98.1°F

## 2022-09-21 DIAGNOSIS — D500 Iron deficiency anemia secondary to blood loss (chronic): Secondary | ICD-10-CM

## 2022-09-21 DIAGNOSIS — D509 Iron deficiency anemia, unspecified: Secondary | ICD-10-CM | POA: Diagnosis not present

## 2022-09-21 MED ORDER — SODIUM CHLORIDE 0.9 % IV SOLN
200.0000 mg | Freq: Once | INTRAVENOUS | Status: AC
  Administered 2022-09-21: 200 mg via INTRAVENOUS
  Filled 2022-09-21: qty 200

## 2022-09-21 MED ORDER — SODIUM CHLORIDE 0.9 % IV SOLN
Freq: Once | INTRAVENOUS | Status: AC
  Filled 2022-09-21: qty 250

## 2022-09-21 NOTE — Patient Instructions (Signed)

## 2022-09-23 NOTE — Telephone Encounter (Signed)
Form was faxed this am and then sent to scanning

## 2022-09-23 NOTE — Telephone Encounter (Signed)
Aware- I put the pre op form to send to urology in the IN box this am Thanks

## 2022-09-25 ENCOUNTER — Other Ambulatory Visit: Payer: Self-pay

## 2022-09-25 ENCOUNTER — Ambulatory Visit (HOSPITAL_BASED_OUTPATIENT_CLINIC_OR_DEPARTMENT_OTHER): Payer: 59 | Admitting: Physician Assistant

## 2022-09-25 ENCOUNTER — Encounter (HOSPITAL_COMMUNITY)
Admission: RE | Disposition: A | Discharge status: Home / Self Care | Payer: Self-pay | Source: Home / Self Care | Attending: Urology

## 2022-09-25 ENCOUNTER — Encounter (HOSPITAL_COMMUNITY): Payer: Self-pay | Admitting: Urology

## 2022-09-25 ENCOUNTER — Observation Stay (HOSPITAL_COMMUNITY)
Admission: RE | Admit: 2022-09-25 | Discharge: 2022-09-26 | Disposition: A | Discharge status: Home / Self Care | Payer: 59 | Attending: Urology | Admitting: Urology

## 2022-09-25 ENCOUNTER — Ambulatory Visit (HOSPITAL_COMMUNITY): Payer: 59 | Admitting: Physician Assistant

## 2022-09-25 DIAGNOSIS — Z79899 Other long term (current) drug therapy: Secondary | ICD-10-CM | POA: Diagnosis not present

## 2022-09-25 DIAGNOSIS — N8189 Other female genital prolapse: Secondary | ICD-10-CM

## 2022-09-25 DIAGNOSIS — N393 Stress incontinence (female) (male): Secondary | ICD-10-CM | POA: Diagnosis not present

## 2022-09-25 DIAGNOSIS — R7303 Prediabetes: Secondary | ICD-10-CM

## 2022-09-25 DIAGNOSIS — N8111 Cystocele, midline: Secondary | ICD-10-CM | POA: Insufficient documentation

## 2022-09-25 DIAGNOSIS — N819 Female genital prolapse, unspecified: Secondary | ICD-10-CM | POA: Diagnosis present

## 2022-09-25 HISTORY — PX: ROBOTIC ASSISTED LAPAROSCOPIC SACROCOLPOPEXY: SHX5388

## 2022-09-25 LAB — COMPREHENSIVE METABOLIC PANEL
ALT: 18 U/L (ref 0–44)
AST: 54 U/L — ABNORMAL HIGH (ref 15–41)
Albumin: 3.6 g/dL (ref 3.5–5.0)
Alkaline Phosphatase: 93 U/L (ref 38–126)
Anion gap: 8 (ref 5–15)
BUN: 7 mg/dL — ABNORMAL LOW (ref 8–23)
CO2: 25 mmol/L (ref 22–32)
Calcium: 9.3 mg/dL (ref 8.9–10.3)
Chloride: 99 mmol/L (ref 98–111)
Creatinine, Ser: 0.62 mg/dL (ref 0.44–1.00)
GFR, Estimated: >60 mL/min (ref >60)
Glucose, Bld: 98 mg/dL (ref 70–99)
Potassium: 4.6 mmol/L (ref 3.5–5.1)
Sodium: 132 mmol/L — ABNORMAL LOW (ref 135–145)
Total Bilirubin: 2 mg/dL — ABNORMAL HIGH (ref 0.3–1.2)
Total Protein: 7.4 g/dL (ref 6.5–8.1)

## 2022-09-25 LAB — CBC
HCT: 36.2 % (ref 36.0–46.0)
Hemoglobin: 11.2 g/dL — ABNORMAL LOW (ref 12.0–15.0)
MCH: 25 pg — ABNORMAL LOW (ref 26.0–34.0)
MCHC: 30.9 g/dL (ref 30.0–36.0)
MCV: 80.8 fL (ref 80.0–100.0)
Platelets: 168 10*3/uL (ref 150–400)
RBC: 4.48 MIL/uL (ref 3.87–5.11)
RDW: 20.4 % — ABNORMAL HIGH (ref 11.5–15.5)
WBC: 3.8 10*3/uL — ABNORMAL LOW (ref 4.0–10.5)
nRBC: 0 % (ref 0.0–0.2)

## 2022-09-25 LAB — ABO/RH: ABO/RH(D): O POS

## 2022-09-25 LAB — GLUCOSE, CAPILLARY: Glucose-Capillary: 106 mg/dL — ABNORMAL HIGH (ref 70–99)

## 2022-09-25 SURGERY — SACROCOLPOPEXY, ROBOT-ASSISTED, LAPAROSCOPIC
Anesthesia: General

## 2022-09-25 MED ORDER — MIDAZOLAM HCL 2 MG/2ML IJ SOLN
INTRAMUSCULAR | Status: AC
  Filled 2022-09-25: qty 2

## 2022-09-25 MED ORDER — ONDANSETRON HCL 4 MG/2ML IJ SOLN
4.0000 mg | INTRAMUSCULAR | Status: DC | PRN
  Administered 2022-09-25: 4 mg via INTRAVENOUS
  Filled 2022-09-25: qty 2

## 2022-09-25 MED ORDER — OXYCODONE HCL 5 MG/5ML PO SOLN: 5.0000 mg | Freq: Once | ORAL | Status: DC | PRN

## 2022-09-25 MED ORDER — KETOROLAC TROMETHAMINE 30 MG/ML IJ SOLN
INTRAMUSCULAR | Status: DC | PRN
  Administered 2022-09-25: 30 mg via INTRAVENOUS

## 2022-09-25 MED ORDER — OXYCODONE HCL 5 MG PO TABS: 5.0000 mg | ORAL_TABLET | Freq: Once | ORAL | Status: DC | PRN

## 2022-09-25 MED ORDER — HYDROMORPHONE HCL 1 MG/ML IJ SOLN
INTRAMUSCULAR | Status: AC
  Administered 2022-09-25: 0.5 mg via INTRAVENOUS
  Filled 2022-09-25: qty 1

## 2022-09-25 MED ORDER — ACETAMINOPHEN 10 MG/ML IV SOLN
INTRAVENOUS | Status: AC
  Filled 2022-09-25: qty 100

## 2022-09-25 MED ORDER — HYDROMORPHONE HCL 1 MG/ML IJ SOLN
0.5000 mg | INTRAMUSCULAR | Status: DC | PRN
  Administered 2022-09-25: 0.5 mg via INTRAVENOUS
  Filled 2022-09-25: qty 0.5

## 2022-09-25 MED ORDER — MIDAZOLAM HCL 2 MG/2ML IJ SOLN
INTRAMUSCULAR | Status: DC | PRN
  Administered 2022-09-25: 2 mg via INTRAVENOUS

## 2022-09-25 MED ORDER — CHLORHEXIDINE GLUCONATE 0.12 % MT SOLN
15.0000 mL | Freq: Once | OROMUCOSAL | Status: AC
  Administered 2022-09-25: 15 mL via OROMUCOSAL

## 2022-09-25 MED ORDER — ONDANSETRON HCL 4 MG PO TABS: 4.0000 mg | ORAL_TABLET | Freq: Three times a day (TID) | ORAL | 0 refills | Status: DC | PRN

## 2022-09-25 MED ORDER — CEFAZOLIN SODIUM-DEXTROSE 1-4 GM/50ML-% IV SOLN
1.0000 g | Freq: Three times a day (TID) | INTRAVENOUS | Status: AC
  Administered 2022-09-25 (×2): 1 g via INTRAVENOUS
  Filled 2022-09-25 (×2): qty 50

## 2022-09-25 MED ORDER — ACETAMINOPHEN 10 MG/ML IV SOLN
1000.0000 mg | Freq: Four times a day (QID) | INTRAVENOUS | Status: AC
  Administered 2022-09-25 – 2022-09-26 (×4): 1000 mg via INTRAVENOUS
  Filled 2022-09-25 (×4): qty 100

## 2022-09-25 MED ORDER — SCOPOLAMINE 1 MG/3DAYS TD PT72
MEDICATED_PATCH | TRANSDERMAL | Status: AC
  Filled 2022-09-25: qty 1

## 2022-09-25 MED ORDER — ESTRADIOL 0.1 MG/GM VA CREA: 1.0000 | TOPICAL_CREAM | VAGINAL | 1 refills | Status: DC

## 2022-09-25 MED ORDER — LACTATED RINGERS IV SOLN: INTRAVENOUS | Status: DC | PRN

## 2022-09-25 MED ORDER — EPHEDRINE 5 MG/ML INJ
INTRAVENOUS | Status: AC
  Filled 2022-09-25: qty 5

## 2022-09-25 MED ORDER — KETAMINE HCL 10 MG/ML IJ SOLN
INTRAMUSCULAR | Status: DC | PRN
  Administered 2022-09-25: 30 mg via INTRAVENOUS

## 2022-09-25 MED ORDER — SENNA 8.6 MG PO TABS
1.0000 | ORAL_TABLET | Freq: Two times a day (BID) | ORAL | Status: DC
  Filled 2022-09-25 (×2): qty 1

## 2022-09-25 MED ORDER — ORAL CARE MOUTH RINSE: 15.0000 mL | Freq: Once | OROMUCOSAL | Status: AC

## 2022-09-25 MED ORDER — MORPHINE SULFATE 15 MG PO TABS: 30.0000 mg | ORAL_TABLET | Freq: Three times a day (TID) | ORAL | Status: DC

## 2022-09-25 MED ORDER — LIDOCAINE HCL 1 % IJ SOLN
INTRAMUSCULAR | Status: AC
  Filled 2022-09-25: qty 20

## 2022-09-25 MED ORDER — FENTANYL CITRATE (PF) 250 MCG/5ML IJ SOLN
INTRAMUSCULAR | Status: AC
  Filled 2022-09-25: qty 5

## 2022-09-25 MED ORDER — MAGNESIUM CITRATE PO SOLN: 1.0000 | Freq: Once | ORAL | Status: DC

## 2022-09-25 MED ORDER — CIPROFLOXACIN IN D5W 400 MG/200ML IV SOLN
400.0000 mg | INTRAVENOUS | Status: AC
  Administered 2022-09-25: 400 mg via INTRAVENOUS
  Filled 2022-09-25: qty 200

## 2022-09-25 MED ORDER — SCOPOLAMINE 1 MG/3DAYS TD PT72
MEDICATED_PATCH | TRANSDERMAL | Status: DC | PRN
  Administered 2022-09-25: 1.5 mg via TRANSDERMAL

## 2022-09-25 MED ORDER — CEFAZOLIN SODIUM-DEXTROSE 2-4 GM/100ML-% IV SOLN
2.0000 g | INTRAVENOUS | Status: AC
  Administered 2022-09-25: 2 g via INTRAVENOUS
  Filled 2022-09-25: qty 100

## 2022-09-25 MED ORDER — STERILE WATER FOR IRRIGATION IR SOLN
Status: DC | PRN
  Administered 2022-09-25: 1000 mL

## 2022-09-25 MED ORDER — BUPIVACAINE LIPOSOME 1.3 % IJ SUSP
INTRAMUSCULAR | Status: AC
  Filled 2022-09-25: qty 20

## 2022-09-25 MED ORDER — ROCURONIUM BROMIDE 10 MG/ML (PF) SYRINGE
PREFILLED_SYRINGE | INTRAVENOUS | Status: AC
  Filled 2022-09-25: qty 10

## 2022-09-25 MED ORDER — PANTOPRAZOLE SODIUM 40 MG PO TBEC
40.0000 mg | DELAYED_RELEASE_TABLET | Freq: Two times a day (BID) | ORAL | Status: DC
  Administered 2022-09-25 – 2022-09-26 (×2): 40 mg via ORAL
  Filled 2022-09-25 (×2): qty 1

## 2022-09-25 MED ORDER — ACETAMINOPHEN 10 MG/ML IV SOLN
INTRAVENOUS | Status: DC | PRN
  Administered 2022-09-25: 1000 mg via INTRAVENOUS

## 2022-09-25 MED ORDER — HYDROMORPHONE HCL 1 MG/ML IJ SOLN
0.2500 mg | INTRAMUSCULAR | Status: DC | PRN
  Administered 2022-09-25 (×2): 0.5 mg via INTRAVENOUS

## 2022-09-25 MED ORDER — LACTATED RINGERS IR SOLN
Status: DC | PRN
  Administered 2022-09-25: 1

## 2022-09-25 MED ORDER — ONDANSETRON HCL 4 MG/2ML IJ SOLN
INTRAMUSCULAR | Status: AC
  Filled 2022-09-25: qty 2

## 2022-09-25 MED ORDER — CEFAZOLIN SODIUM-DEXTROSE 2-4 GM/100ML-% IV SOLN
INTRAVENOUS | Status: AC
  Filled 2022-09-25: qty 100

## 2022-09-25 MED ORDER — LIDOCAINE 20MG/ML (2%) 15 ML SYRINGE OPTIME
INTRAMUSCULAR | Status: DC | PRN
  Administered 2022-09-25: 1.5 mg/kg/h via INTRAVENOUS

## 2022-09-25 MED ORDER — FENTANYL CITRATE (PF) 100 MCG/2ML IJ SOLN
INTRAMUSCULAR | Status: AC
  Filled 2022-09-25: qty 2

## 2022-09-25 MED ORDER — LIDOCAINE HCL 1 % IJ SOLN
INTRAMUSCULAR | Status: DC | PRN
  Administered 2022-09-25: 20 mL

## 2022-09-25 MED ORDER — EPHEDRINE SULFATE-NACL 50-0.9 MG/10ML-% IV SOSY
PREFILLED_SYRINGE | INTRAVENOUS | Status: DC | PRN
  Administered 2022-09-25: 5 mg via INTRAVENOUS

## 2022-09-25 MED ORDER — MORPHINE SULFATE 30 MG PO TABS: 30.0000 mg | ORAL_TABLET | ORAL | 0 refills | Status: DC | PRN

## 2022-09-25 MED ORDER — KETOROLAC TROMETHAMINE 30 MG/ML IJ SOLN
INTRAMUSCULAR | Status: AC
  Filled 2022-09-25: qty 1

## 2022-09-25 MED ORDER — SODIUM CHLORIDE 0.9% FLUSH
3.0000 mL | Freq: Two times a day (BID) | INTRAVENOUS | Status: DC
  Administered 2022-09-25 – 2022-09-26 (×3): 3 mL via INTRAVENOUS

## 2022-09-25 MED ORDER — DEXAMETHASONE SODIUM PHOSPHATE 10 MG/ML IJ SOLN
INTRAMUSCULAR | Status: AC
  Filled 2022-09-25: qty 1

## 2022-09-25 MED ORDER — SUGAMMADEX SODIUM 200 MG/2ML IV SOLN
INTRAVENOUS | Status: DC | PRN
  Administered 2022-09-25: 160 mg via INTRAVENOUS

## 2022-09-25 MED ORDER — SODIUM CHLORIDE 0.9 % IV SOLN: 250.0000 mL | INTRAVENOUS | Status: DC | PRN

## 2022-09-25 MED ORDER — KETAMINE HCL 10 MG/ML IJ SOLN
INTRAMUSCULAR | Status: AC
  Filled 2022-09-25: qty 1

## 2022-09-25 MED ORDER — DEXAMETHASONE SODIUM PHOSPHATE 10 MG/ML IJ SOLN
INTRAMUSCULAR | Status: DC | PRN
  Administered 2022-09-25: 5 mg via INTRAVENOUS

## 2022-09-25 MED ORDER — SODIUM CHLORIDE 0.9% FLUSH: 3.0000 mL | INTRAVENOUS | Status: DC | PRN

## 2022-09-25 MED ORDER — ESTRADIOL 0.1 MG/GM VA CREA
TOPICAL_CREAM | VAGINAL | Status: DC | PRN
  Administered 2022-09-25: 1 via VAGINAL

## 2022-09-25 MED ORDER — BUPIVACAINE LIPOSOME 1.3 % IJ SUSP
INTRAMUSCULAR | Status: DC | PRN
  Administered 2022-09-25: 20 mL

## 2022-09-25 MED ORDER — MORPHINE SULFATE 15 MG PO TABS
30.0000 mg | ORAL_TABLET | Freq: Three times a day (TID) | ORAL | Status: DC
  Administered 2022-09-25 – 2022-09-26 (×3): 30 mg via ORAL
  Filled 2022-09-25 (×3): qty 2

## 2022-09-25 MED ORDER — ONDANSETRON HCL 4 MG/2ML IJ SOLN
INTRAMUSCULAR | Status: DC | PRN
  Administered 2022-09-25: 4 mg via INTRAVENOUS

## 2022-09-25 MED ORDER — LACTATED RINGERS IV SOLN: INTRAVENOUS | Status: DC

## 2022-09-25 MED ORDER — BUPIVACAINE-EPINEPHRINE (PF) 0.5% -1:200000 IJ SOLN
INTRAMUSCULAR | Status: AC
  Filled 2022-09-25: qty 30

## 2022-09-25 MED ORDER — LIDOCAINE HCL (PF) 2 % IJ SOLN
INTRAMUSCULAR | Status: AC
  Filled 2022-09-25: qty 5

## 2022-09-25 MED ORDER — ROCURONIUM BROMIDE 10 MG/ML (PF) SYRINGE
PREFILLED_SYRINGE | INTRAVENOUS | Status: DC | PRN
  Administered 2022-09-25: 10 mg via INTRAVENOUS
  Administered 2022-09-25: 60 mg via INTRAVENOUS
  Administered 2022-09-25 (×2): 20 mg via INTRAVENOUS

## 2022-09-25 MED ORDER — KETOROLAC TROMETHAMINE 15 MG/ML IJ SOLN
15.0000 mg | Freq: Four times a day (QID) | INTRAMUSCULAR | Status: DC
  Administered 2022-09-25 – 2022-09-26 (×3): 15 mg via INTRAVENOUS
  Filled 2022-09-25 (×3): qty 1

## 2022-09-25 MED ORDER — PROPOFOL 10 MG/ML IV BOLUS
INTRAVENOUS | Status: DC | PRN
  Administered 2022-09-25: 150 mg via INTRAVENOUS

## 2022-09-25 MED ORDER — SODIUM CHLORIDE 0.9 % IV SOLN: INTRAVENOUS | Status: DC

## 2022-09-25 MED ORDER — LIDOCAINE 2% (20 MG/ML) 5 ML SYRINGE
INTRAMUSCULAR | Status: DC | PRN
  Administered 2022-09-25: 30 mg via INTRAVENOUS

## 2022-09-25 MED ORDER — ESTRADIOL 0.1 MG/GM VA CREA
TOPICAL_CREAM | VAGINAL | Status: AC
  Filled 2022-09-25: qty 42.5

## 2022-09-25 MED ORDER — ONDANSETRON HCL 4 MG/2ML IJ SOLN
4.0000 mg | Freq: Once | INTRAMUSCULAR | Status: AC | PRN
  Administered 2022-09-25: 4 mg via INTRAVENOUS

## 2022-09-25 MED ORDER — CEPHALEXIN 500 MG PO CAPS: 500.0000 mg | ORAL_CAPSULE | Freq: Three times a day (TID) | ORAL | 0 refills | Status: AC

## 2022-09-25 MED ORDER — CLINDAMYCIN PHOSPHATE 2 % VA CREA
TOPICAL_CREAM | VAGINAL | Status: AC
  Filled 2022-09-25: qty 40

## 2022-09-25 MED ORDER — FENTANYL CITRATE (PF) 250 MCG/5ML IJ SOLN
INTRAMUSCULAR | Status: DC | PRN
  Administered 2022-09-25 (×3): 50 ug via INTRAVENOUS
  Administered 2022-09-25: 100 ug via INTRAVENOUS
  Administered 2022-09-25: 50 ug via INTRAVENOUS

## 2022-09-25 MED ORDER — PROPOFOL 10 MG/ML IV BOLUS
INTRAVENOUS | Status: AC
  Filled 2022-09-25: qty 20

## 2022-09-25 MED ORDER — DOCUSATE SODIUM 100 MG PO CAPS
100.0000 mg | ORAL_CAPSULE | Freq: Two times a day (BID) | ORAL | Status: DC
  Filled 2022-09-25 (×2): qty 1

## 2022-09-25 SURGICAL SUPPLY — 91 items
ADH SKN CLS APL DERMABOND .7 (GAUZE/BANDAGES/DRESSINGS) ×1
APL PRP STRL LF DISP 70% ISPRP (MISCELLANEOUS) ×1
BAG COUNTER SPONGE SURGICOUNT (BAG) IMPLANT
BAG DRN RND TRDRP ANRFLXCHMBR (UROLOGICAL SUPPLIES)
BAG SPNG CNTER NS LX DISP (BAG)
BAG URINE DRAIN 2000ML AR STRL (UROLOGICAL SUPPLIES) IMPLANT
BLADE HEX COATED 2.75 (ELECTRODE) ×1 IMPLANT
BLADE SURG 15 STRL LF DISP TIS (BLADE) ×2 IMPLANT
BLADE SURG 15 STRL SS (BLADE) ×2
BRIEF MESH DISP LRG (UNDERPADS AND DIAPERS) IMPLANT
CATH FOLEY 2WAY SLVR  5CC 16FR (CATHETERS) ×1
CATH FOLEY 2WAY SLVR 5CC 16FR (CATHETERS) ×1 IMPLANT
CHLORAPREP W/TINT 26 (MISCELLANEOUS) ×1 IMPLANT
CLIP LIGATING HEM O LOK PURPLE (MISCELLANEOUS) ×1 IMPLANT
CLIP LIGATING HEMO LOK XL GOLD (MISCELLANEOUS) IMPLANT
CLIP LIGATING HEMO O LOK GREEN (MISCELLANEOUS) IMPLANT
COVER BACK TABLE 60X90IN (DRAPES) ×1 IMPLANT
COVER MAYO STAND STRL (DRAPES) IMPLANT
COVER SURGICAL LIGHT HANDLE (MISCELLANEOUS) ×1 IMPLANT
COVER TIP SHEARS 8 DVNC (MISCELLANEOUS) ×1 IMPLANT
COVER TIP SHEARS 8MM DA VINCI (MISCELLANEOUS) ×1
DERMABOND ADVANCED .7 DNX12 (GAUZE/BANDAGES/DRESSINGS) ×1 IMPLANT
DRAIN CHANNEL RND F F (WOUND CARE) IMPLANT
DRAPE ARM DVNC X/XI (DISPOSABLE) ×4 IMPLANT
DRAPE COLUMN DVNC XI (DISPOSABLE) ×1 IMPLANT
DRAPE DA VINCI XI ARM (DISPOSABLE) ×4
DRAPE DA VINCI XI COLUMN (DISPOSABLE) ×1
DRAPE INCISE IOBAN 66X45 STRL (DRAPES) ×1 IMPLANT
DRAPE SHEET LG 3/4 BI-LAMINATE (DRAPES) ×2 IMPLANT
DRAPE SURG IRRIG POUCH 19X23 (DRAPES) ×1 IMPLANT
ELECT PENCIL ROCKER SW 15FT (MISCELLANEOUS) ×1 IMPLANT
ELECT REM PT RETURN 15FT ADLT (MISCELLANEOUS) ×1 IMPLANT
GAUZE 4X4 16PLY ~~LOC~~+RFID DBL (SPONGE) ×2 IMPLANT
GAUZE STRIP PACKING 2INX5YD (MISCELLANEOUS) ×1 IMPLANT
GLOVE BIO SURGEON STRL SZ 6.5 (GLOVE) ×1 IMPLANT
GLOVE BIOGEL PI IND STRL 8 (GLOVE) ×1 IMPLANT
GLOVE SURG LX STRL 7.5 STRW (GLOVE) ×3 IMPLANT
GOWN SRG XL LVL 4 BRTHBL STRL (GOWNS) ×1 IMPLANT
GOWN STRL NON-REIN XL LVL4 (GOWNS) ×3
GOWN STRL REUS W/ TWL XL LVL3 (GOWN DISPOSABLE) ×2 IMPLANT
GOWN STRL REUS W/TWL XL LVL3 (GOWN DISPOSABLE) ×2
HOLDER FOLEY CATH W/STRAP (MISCELLANEOUS) ×1 IMPLANT
IRRIG SUCT STRYKERFLOW 2 WTIP (MISCELLANEOUS) ×1
IRRIGATION SUCT STRKRFLW 2 WTP (MISCELLANEOUS) ×1 IMPLANT
KIT BASIN OR (CUSTOM PROCEDURE TRAY) ×1 IMPLANT
KIT TURNOVER KIT A (KITS) IMPLANT
MANIPULATOR UTERINE 4.5 ZUMI (MISCELLANEOUS) IMPLANT
MARKER SKIN DUAL TIP RULER LAB (MISCELLANEOUS) ×1 IMPLANT
MESH Y UPSYLON VAGINAL (Mesh General) IMPLANT
NEEDLE HYPO 22GX1.5 SAFETY (NEEDLE) IMPLANT
NS IRRIG 1000ML POUR BTL (IV SOLUTION) ×1 IMPLANT
OCCLUDER COLPOPNEUMO (BALLOONS) IMPLANT
PACK CYSTO (CUSTOM PROCEDURE TRAY) ×1 IMPLANT
PACKING VAGINAL (PACKING) IMPLANT
PAD OB MATERNITY 4.3X12.25 (PERSONAL CARE ITEMS) IMPLANT
PAD POSITIONING PINK XL (MISCELLANEOUS) ×1 IMPLANT
PLUG CATH AND CAP STER (CATHETERS) ×1 IMPLANT
RETRACTOR LONRSTAR 16.6X16.6CM (MISCELLANEOUS) ×1 IMPLANT
RETRACTOR STAY HOOK 5MM (MISCELLANEOUS) ×1 IMPLANT
RETRACTOR STER APS 16.6X16.6CM (MISCELLANEOUS) ×1
SEAL CANN UNIV 5-8 DVNC XI (MISCELLANEOUS) ×4 IMPLANT
SEAL XI 5MM-8MM UNIVERSAL (MISCELLANEOUS) ×4
SET IRRIG Y TYPE TUR BLADDER L (SET/KITS/TRAYS/PACK) IMPLANT
SET TUBE SMOKE EVAC HIGH FLOW (TUBING) ×1 IMPLANT
SHEET LAVH (DRAPES) ×1 IMPLANT
SOL PREP POV-IOD 4OZ 10% (MISCELLANEOUS) IMPLANT
SOLUTION ELECTROLUBE (MISCELLANEOUS) ×1 IMPLANT
SPIKE FLUID TRANSFER (MISCELLANEOUS) ×1 IMPLANT
SURGILUBE 2OZ TUBE FLIPTOP (MISCELLANEOUS) IMPLANT
SUT MNCRL AB 4-0 PS2 18 (SUTURE) ×2 IMPLANT
SUT PROLENE 2 0 CT 1 (SUTURE) ×1 IMPLANT
SUT VIC AB 0 CT1 27 (SUTURE) ×1
SUT VIC AB 0 CT1 27XBRD ANTBC (SUTURE) ×1 IMPLANT
SUT VIC AB 2-0 SH 27 (SUTURE) ×5
SUT VIC AB 2-0 SH 27XBRD (SUTURE) ×5 IMPLANT
SUT VIC AB 2-0 UR6 27 (SUTURE) ×1 IMPLANT
SUT VIC AB 3-0 SH 27 (SUTURE) ×1
SUT VIC AB 3-0 SH 27X BRD (SUTURE) IMPLANT
SUT VICRYL 0 UR6 27IN ABS (SUTURE) ×1 IMPLANT
SUT VLOC 180 3-0 9IN GS21 (SUTURE) IMPLANT
SYR 10ML LL (SYRINGE) ×1 IMPLANT
SYR 50ML LL SCALE MARK (SYRINGE) IMPLANT
SYS BAG RETRIEVAL 10MM (BASKET) ×1
SYSTEM BAG RETRIEVAL 10MM (BASKET) IMPLANT
TOWEL OR 17X26 10 PK STRL BLUE (TOWEL DISPOSABLE) ×1 IMPLANT
TRAY LAPAROSCOPIC (CUSTOM PROCEDURE TRAY) ×1 IMPLANT
TROCAR Z THREAD OPTICAL 12X100 (TROCAR) ×1 IMPLANT
TROCAR Z-THREAD FIOS 5X100MM (TROCAR) ×1 IMPLANT
TUBING CONNECTING 10 (TUBING) ×1 IMPLANT
WATER STERILE IRR 1000ML POUR (IV SOLUTION) ×1 IMPLANT
WATER STERILE IRR 500ML POUR (IV SOLUTION) ×1 IMPLANT

## 2022-09-25 NOTE — Anesthesia Procedure Notes (Signed)
Procedure Name: Intubation Date/Time: 09/25/2022 7:39 AM  Performed by: Lollie Sails, CRNAPre-anesthesia Checklist: Patient identified, Emergency Drugs available, Suction available, Patient being monitored and Timeout performed Patient Re-evaluated:Patient Re-evaluated prior to induction Oxygen Delivery Method: Circle system utilized Preoxygenation: Pre-oxygenation with 100% oxygen Induction Type: IV induction Ventilation: Mask ventilation without difficulty Laryngoscope Size: Miller and 3 Grade View: Grade I Tube type: Oral Tube size: 7.0 mm Number of attempts: 1 Airway Equipment and Method: Stylet Placement Confirmation: ETT inserted through vocal cords under direct vision, positive ETCO2 and breath sounds checked- equal and bilateral Secured at: 22 cm Tube secured with: Tape Dental Injury: Teeth and Oropharynx as per pre-operative assessment

## 2022-09-25 NOTE — Interval H&P Note (Signed)
History and Physical Interval Note:  09/25/2022 7:24 AM  [name] Branch  has presented today for surgery, with the diagnosis of PELVIC ORGAN PROLAPSE/STRESS URINARY INCONTINENCE.    Robotic Sacrocolpopexy/bilateral salpingo-oophorectomy.  The various methods of treatment have been discussed with the patient and family. After consideration of risks, benefits and other options for treatment, the patient has consented to   as a surgical intervention.  The patient's history has been reviewed, patient examined, no change in status, stable for surgery.  I have reviewed the patient's chart and labs.  Questions were answered to the patient's satisfaction.     Ardis Hughs

## 2022-09-25 NOTE — Anesthesia Postprocedure Evaluation (Signed)
Anesthesia Post Note  Patient: [name] Branch  Procedure(s) Performed: XI ROBOTIC ASSISTED LAPAROSCOPIC SACROCOLPOPEXY; BILATERAL SALPINGOOPHERECTOMY     Patient location during evaluation: PACU Anesthesia Type: General Level of consciousness: awake and alert Pain management: pain level controlled Vital Signs Assessment: post-procedure vital signs reviewed and stable Respiratory status: spontaneous breathing, nonlabored ventilation, respiratory function stable and patient connected to nasal cannula oxygen Cardiovascular status: blood pressure returned to baseline and stable Postop Assessment: no apparent nausea or vomiting Anesthetic complications: no  No notable events documented.  Last Vitals:  Vitals:   09/25/22 1245 09/25/22 1300  BP: 114/73 (!) 115/57  Pulse: 94 90  Resp: 17 15  Temp:    SpO2: 93% 99%    Last Pain:  Vitals:   09/25/22 1245  TempSrc:   PainSc: 7                  [name] S

## 2022-09-25 NOTE — Op Note (Signed)
Preoperative diagnosis:  Pelvic organ prolapse   Postoperative diagnosis:  same   Procedure: Robotic assisted laparoscopic sacrocolpopexy Bilateral salpingo-oopherectomy  Surgeon: Ardis Hughs, MD 1st assistant: Margarita Sermons, MD  Anesthesia: General  Complications: None  Intraoperative findings: [employer] scientific Upsilon Y-Mesh placed, cut to specific vaginal length  EBL: 50cc  Specimens: Bilateral salpinx and ovaries  Indication: [name] Branch is a 66 y.o. female patient with symptomatic pelvic organ prolapse.  After reviewing the management options for treatment, he elected to proceed with the above surgical procedure(s). We have discussed the potential benefits and risks of the procedure, side effects of the proposed treatment, the likelihood of the patient achieving the goals of the procedure, and any potential problems that might occur during the procedure or recuperation. Informed consent has been obtained.  Description of procedure:  A Foley catheter was then placed and placed to gravity drainage. I then made a periumbilical incision carrying the dissection down to the patient's fascia with electrocautery.  Once to the fascia, the fascia was incised the peritoneum opened.  A 6m port was then placed into the abdomen.  The abdomen was insufflated and the remaining ports placed under digital guidance.  2 ports were placed lateral to the umbilicus on the right proximally 10 cm apart.  The most lateral port was approximately 3 cm above the anterior iliac spine.  2 additional ports were placed in the patient's right side in comparable positions to the most lateral port on the right was a 12 mm port.the robot was then docked at an angle from the leg obliquely along the side of the left leg.  We then began our surgery by cleaning up some of the pelvic adhesions to the small bowel and colon.  I then proceeded to remove the patient's right ovary and fallopian tube.  I did this  by opening of the anterior layer of the broad ligament and isolating the ovarian pedicle.  Using 2 clips down I ligated this and then fulgurated the proximal part.  I then proceeded down the anterior aspect of the broad ligament down to the vagina.  I then copiously fulgurated the posterior layer ensuring that all vessels were sealed.  Once down to the level of the vagina I will ligated the proximal aspect of the broad ligament and the fallopian tube was completely removed.  This procedure was then repeated on the left side.    Once this was completed I started dissecting at the sacral promontory located 3 cm medial to the location where the ureter crosses over the iliac vessels at the pelvic brim. The posterior peritoneum was incised and the sacral prominence cleared off an area taking care to avoid the middle sacral vessels and the iliac branches.  I then created a posterior peritoneal tunnel starting at the sacral promontory and tunneling down the right pelvic sidewall down into the pelvis breaking back through the posterior peritoneum around the vesico-vaginal junction posteriorly.  I then continued the posterior dissection retracting down on the rectum and finding the avascular plane between the posterior vaginal wall and the rectum.  I carried this dissection down as far as I could to along the area of the perineal body.  I then turned my attention to the anterior plane between the anterior vaginal wall and the bladder.  I was able to obtain access to the avascular plane and with a combination of both monopolar cautery and blunt dissection was able to clean and nice down to the bladder  neck.   Mesh was measured at approximately 7.5 cm anteriorly and 7.5 cm posteriorly and I cut this on the back table.  The mesh was then placed into the patient's abdomen through the assistant port and the anterior leaf was secured down onto the anterior vaginal wall with the apex at the bladder neck.  The posterior leaf  was then secured down on the posterior vaginal wall.  These were sewn down with 2-0 Vicryl.  Between 6 and 8 were done on each side.  At this point I then went back to the previously dissected sacral promontory and posterior peritoneal tunnel and inserted a instrument through the tunnel and grasped the end of the mesh at the vaginal cuff and pull it up to the sacrum.  I then checked to ensure that the sacral mesh was not too tight by performing a vaginal exam.  I then secured the sacral leg of the mesh using a 0 Prolene.  I then reapproximated the posterior peritoneum with a 2-0 Vicryl in a running fashion around the sacral promontory.  The pelvic peritoneum was closed using a pursestring.   The fascia of the 12 mm port was then closed with 0 Vicryl in a figure-of-eight fashion.  The skin was closed with 4-0 Monocryl's.  Dermabond was applied to the incision and exparel injected into the incisions.  Estrace impregnated packing was then placed into her vagina which will be left in overnight.  The patient was subsequently extubated and returned to the PACU in excellent condition.   Ardis Hughs, M.D.

## 2022-09-25 NOTE — Anesthesia Preprocedure Evaluation (Signed)
Anesthesia Evaluation  Patient identified by MRN, date of birth, ID band Patient awake    Reviewed: Allergy & Precautions, H&P , NPO status , Patient's Chart, lab work & pertinent test results  History of Anesthesia Complications (+) PONV and history of anesthetic complications  Airway Mallampati: II  TM Distance: >3 FB Neck ROM: Full    Dental  (+) Poor Dentition, Chipped, Dental Advisory Given   Pulmonary neg pulmonary ROS   Pulmonary exam normal breath sounds clear to auscultation       Cardiovascular negative cardio ROS Normal cardiovascular exam Rhythm:Regular Rate:Normal     Neuro/Psych negative neurological ROS  negative psych ROS   GI/Hepatic Neg liver ROS,GERD  ,,  Endo/Other  negative endocrine ROS    Renal/GU negative Renal ROS  negative genitourinary   Musculoskeletal negative musculoskeletal ROS (+)    Abdominal   Peds negative pediatric ROS (+)  Hematology negative hematology ROS (+)   Anesthesia Other Findings   Reproductive/Obstetrics negative OB ROS                             Anesthesia Physical Anesthesia Plan  ASA: 2  Anesthesia Plan: General   Post-op Pain Management: Ofirmev IV (intra-op)*   Induction: Intravenous  PONV Risk Score and Plan: 4 or greater and Ondansetron, Dexamethasone, Midazolam and Scopolamine patch - Pre-op  Airway Management Planned: Oral ETT  Additional Equipment:   Intra-op Plan:   Post-operative Plan: Extubation in OR  Informed Consent: I have reviewed the patients History and Physical, chart, labs and discussed the procedure including the risks, benefits and alternatives for the proposed anesthesia with the patient or authorized representative who has indicated his/her understanding and acceptance.     Dental advisory given  Plan Discussed with: CRNA and Surgeon  Anesthesia Plan Comments:        Anesthesia Quick  Evaluation

## 2022-09-25 NOTE — H&P (Signed)
66 year old female presents today for further evaluation of pelvic organ prolapse.   The patient has a long history of pelvic organ prolapse and was seen in 2018 here by Dr. Matilde Sprang. At that time he was treating her for overactive bladder. She was lost to follow-up for various reasons. She has had subsequent worsening of her prolapse so that now she has near complete prolapse of the apex of her vagina. She has a lot of pain associated with her prolapse. It is life limiting. She has a lot of associated urinary urgency and associated urge incontinence. She denies any leakage with laughing, coughing, or sneezing. She voids every hour at least.   The patient also has a history of recurrent urinary tract infections. She is currently taking a suppressive antibiotic to prevent them. She has been hospitalized for recurrent infections in the past. However, over the last 18 months she is been relatively infection free while being on the suppressive antibiotic. She did have a history of urosepsis from an obstructing stone that was treated by Dr. Erlene Quan in 2018 at [facility].   The patient has a past surgical history of hysterectomy. She has had her gallbladder removed in 2016. Otherwise the patient has no past surgical history. She has a history of a compression fracture of her back that is causing debilitating pain. She also has a history of scoliosis. She is nearly bent over at the waist when walking and has excessive back pain because of her compression fracture.   The patient states that her prolapse is the biggest problem in her life and she wants to get that fixed before she tackles her back pain.    The patient brought photographs of her prolapse today and it is noted that she has complete prolapse of the apex of her vagina.   09/11/2022: The patient has undergone urodynamics which demonstrated no evidence of stress incontinence. She seems to be emptying her bladder relatively well and  otherwise had no real significant overactive component. She is quite ready for her surgery. 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 She is now on a PPI twice daily as well as a weekly iron infusion.     ALLERGIES: Sulfa Drugs Tetracyclines    MEDICATIONS: Estrace 0.01 % cream with applicator 1 bead Per Vagina Every Other Day  Benadryl TABS Oral  Bronkaid TABS Oral  Calcium TABS Oral  Coenzyme Q10 100 mg capsule Oral  Colace 100 mg capsule  DHEA CAPS Oral  Fiber TABS Oral  Hair, Skin And Nails  Ketoconazole 2 % External Cream External  Magnesium 400 mg magnesium capsule Oral  Melatonin 5 mg capsule Oral  Milk Thistle Extract  Multivitamin  Orphenadrine Citrate 100 mg tablet, extended release Oral  Pantoprazole Sodium 40 mg tablet, delayed release  Provigil 200 mg tablet Oral  Simethicone 80 mg tablet,chewable Oral  Spirulina POWD Does Not Apply  Vitamin B12  Vitamin C 500 mg tablet Oral  Vitamin D3 50 mcg (2,000 unit) tablet Oral     Notes: Iron infusions   GU PSH: Complex cystometrogram, w/ void pressure and urethral pressure profile studies, any technique - 09/02/2022 Complex Uroflow - 09/02/2022 Emg surf Electrd - 09/02/2022 Hysterectomy Unilat SO - 2015 Inject For cystogram - 09/02/2022 Intrabd voidng Press - 09/02/2022       PSH Notes: Cholecystectomy, Elbow Surgery, Uterine Surgery, Back Surgery, Hysterectomy, Tonsillectomy   NON-GU  PSH: Cholecystectomy (open) - 2015 Remove Tonsils - 2015     GU PMH: Cystocele, midline - 09/02/2022, - 08/11/2022, Cystocele, midline, - 2017 Urinary Frequency - 09/02/2022, Increased urinary frequency, - 2016 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, Mixed stress and urge urinary incontinence, - 2016 Chronic cystitis (w/o hematuria), Chronic cystitis - 2017 Urinary Tract Inf, Unspec site, Urinary tract infection - 2016 Nocturia, Nocturia - 2016 Rectocele,  Rectocele, female - 2016 Gross hematuria, Gross hematuria - 2016 Other difficulties with micturition, Difficulty voiding - 2016 Urinary Calculus, Unspec, Urolithiasis - 2016 Other microscopic hematuria, Microscopic hematuria - 2016    NON-GU PMH: Encounter for general adult medical examination without abnormal findings, Encounter for preventive health examination - 2017 Narcolepsy without cataplexy, Narcolepsy - 2015 Postviral fatigue syndrome, 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   Tobacco Use Assessment Completed: Used Tobacco in last 30 days? Has never drank.  Does not drink caffeine.     Notes: Never a smoker, Occupation, Caffeine use, Alcohol use, Married   REVIEW OF SYSTEMS:    GU Review Female:   Patient denies frequent urination, hard to postpone urination, burning /pain with urination, get up at night to urinate, leakage of urine, stream starts and stops, trouble starting your stream, have to strain to urinate, and being pregnant.  Gastrointestinal (Upper):   Patient denies nausea, vomiting, and indigestion/ heartburn.  Gastrointestinal (Lower):   Patient denies diarrhea and constipation.  Constitutional:   Patient denies fever, night sweats, weight loss, and fatigue.  Skin:   Patient denies skin rash/ lesion and itching.  Eyes:   Patient denies blurred vision and double vision.  Ears/ Nose/ Throat:   Patient denies sore throat and sinus problems.  Hematologic/Lymphatic:    Patient denies swollen glands and easy bruising.  Cardiovascular:   Patient denies leg swelling and chest pains.  Respiratory:   Patient denies cough and shortness of breath.  Endocrine:   Patient denies excessive thirst.  Musculoskeletal:   Patient denies back pain and joint pain.  Neurological:   Patient denies headaches and dizziness.  Psychologic:   Patient denies depression and anxiety.   VITAL SIGNS:      09/11/2022 01:21 PM  Weight 175 lb / 79.38 kg  BP 126/73 mmHg  Pulse 102 /min   MULTI-SYSTEM PHYSICAL EXAMINATION:    Constitutional: Well-nourished. No physical deformities. Normally developed. Good grooming.  Respiratory: Normal breath sounds. No labored breathing, no use of accessory muscles.   Cardiovascular: Regular rate and rhythm. No murmur, no gallop. Normal temperature, normal extremity pulses, no swelling, no varicosities.      Complexity of Data:  Source Of History:  Patient  Records Review:   Previous Doctor Records, Previous Patient Records, POC Tool  Urine Test Review:   Urinalysis   PROCEDURES: None   ASSESSMENT:      ICD-10 Details  1 GU:   Cystocele, midline - N81.11    PLAN:           Document Letter(s):  Created for Patient: Clinical Summary  Notes:   I went over robotic-assisted laparoscopic sacral colpopexy with the patient detail. I explained to the patient the rationale for the surgery. I also went over the placement of the laparoscopic ports. I detailed to her the surgery as well as the postoperative recovery time. I explained to the patient that she could expect to be in the hospital at least one or 2 nights. She will require 4 weeks of no heavy lifting, 6 weeks of no bending or twisting. She will not be able to use her vagina for 6 weeks. I discussed complications of the operation including injury to bowel, ureters, bladder. We also discussed the risk of failure as well as the complications of mesh. I explained to them the difference  between transvaginal mesh and the mesh used for sacral colpopexy. I reassured them that there has not been an FDA warnings in regards to sacral colpopexy mesh. We will plan to get this prior to her surgery. I spent 45 minutes with the patient going over that ins and outs of the surgery and answering all her questions.   In addition, the patient has requested that we also do a bilateral salpingo-oophorectomy. She does not need a mid urethral sling.

## 2022-09-25 NOTE — Discharge Instructions (Signed)

## 2022-09-25 NOTE — Transfer of Care (Signed)
Immediate Anesthesia Transfer of Care Note  Patient: [name] Branch  Procedure(s) Performed: XI ROBOTIC ASSISTED LAPAROSCOPIC SACROCOLPOPEXY; BILATERAL SALPINGOOPHERECTOMY  Patient Location: PACU  Anesthesia Type:General  Level of Consciousness: awake and drowsy  Airway & Oxygen Therapy: Patient Spontanous Breathing and Patient connected to face mask oxygen  Post-op Assessment: Report given to RN and Post -op Vital signs reviewed and stable  Post vital signs: Reviewed and stable  Last Vitals:  Vitals Value Taken Time  BP 120/86 09/25/22 1136  Temp    Pulse 96 09/25/22 1138  Resp 18 09/25/22 1138  SpO2 100 % 09/25/22 1138  Vitals shown include unvalidated device data.  Last Pain:  Vitals:   09/25/22 0653  TempSrc:   PainSc: 4       Patients Stated Pain Goal: 2 (01/58/68 2574)  Complications: No notable events documented.

## 2022-09-26 DIAGNOSIS — N8189 Other female genital prolapse: Secondary | ICD-10-CM | POA: Diagnosis not present

## 2022-09-26 LAB — CBC
HCT: 29.7 % — ABNORMAL LOW (ref 36.0–46.0)
Hemoglobin: 9.1 g/dL — ABNORMAL LOW (ref 12.0–15.0)
MCH: 25.1 pg — ABNORMAL LOW (ref 26.0–34.0)
MCHC: 30.6 g/dL (ref 30.0–36.0)
MCV: 82 fL (ref 80.0–100.0)
Platelets: 142 10*3/uL — ABNORMAL LOW (ref 150–400)
RBC: 3.62 MIL/uL — ABNORMAL LOW (ref 3.87–5.11)
RDW: 20.7 % — ABNORMAL HIGH (ref 11.5–15.5)
WBC: 5.4 10*3/uL (ref 4.0–10.5)
nRBC: 0 % (ref 0.0–0.2)

## 2022-09-26 LAB — BASIC METABOLIC PANEL
Anion gap: 6 (ref 5–15)
BUN: 10 mg/dL (ref 8–23)
CO2: 22 mmol/L (ref 22–32)
Calcium: 8.1 mg/dL — ABNORMAL LOW (ref 8.9–10.3)
Chloride: 105 mmol/L (ref 98–111)
Creatinine, Ser: 0.54 mg/dL (ref 0.44–1.00)
GFR, Estimated: >60 mL/min (ref >60)
Glucose, Bld: 92 mg/dL (ref 70–99)
Potassium: 4.2 mmol/L (ref 3.5–5.1)
Sodium: 133 mmol/L — ABNORMAL LOW (ref 135–145)

## 2022-09-26 MED ORDER — MORPHINE SULFATE 30 MG PO TABS: 30.0000 mg | ORAL_TABLET | Freq: Four times a day (QID) | ORAL | 0 refills | Status: AC | PRN

## 2022-09-26 NOTE — Progress Notes (Signed)
  Transition of Care [facility]) Screening Note   Patient Details  Name: [name] Branch Date of Birth: December 14, 1955   Transition of Care [facility]) CM/SW Contact:    Vassie Moselle, LCSW Phone Number: 09/26/2022, 11:44 AM    Transition of Care Department [facility]) has reviewed patient and no TOC needs have been identified at this time. We will continue to monitor patient advancement through interdisciplinary progression rounds. If new patient transition needs arise, please place a TOC consult.

## 2022-09-26 NOTE — Plan of Care (Signed)

## 2022-09-26 NOTE — Progress Notes (Signed)
1 Day Post-Op Subjective: No issues overnight.  Pain controlled this morning.  She is tolerating liquids, but not passing flatus.  Denies nausea or vomiting.  Catheter catheter was removed this morning, but she has not urinated yet.  Packing also removed.  Objective: Vital signs in last 24 hours: Temp:  [97.2 F (36.2 C)-98.7 F (37.1 C)] 98.1 F (36.7 C) (12/02 0940) Pulse Rate:  [70-101] 70 (12/02 0940) Resp:  [12-22] 18 (12/02 0940) BP: (96-131)/(41-86) 97/41 (12/02 0940) SpO2:  [91 %-100 %] 93 % (12/02 0940) FiO2 (%):  [28 %] 28 % (12/02 0534)  Intake/Output from previous day: 12/01 0701 - 12/02 0700 In: 4578.9 [P.O.:120; I.V.:3708.9; IV Piggyback:750] Out: 1625 [DTHYH:8887; Blood:50]  Intake/Output this shift: No intake/output data recorded.  Physical Exam:  General: Alert and oriented CV: RRR, palpable distal pulses Lungs: CTAB, equal chest rise Abdomen: Soft, NTND, no rebound or guarding Incisions: Incisions are clean, dry and intact   Lab Results: Recent Labs    09/25/22 0700 09/26/22 0527  HGB 11.2* 9.1*  HCT 36.2 29.7*   BMET Recent Labs    09/25/22 0700 09/26/22 0527  NA 132* 133*  K 4.6 4.2  CL 99 105  CO2 25 22  GLUCOSE 98 92  BUN 7* 10  CREATININE 0.62 0.54  CALCIUM 9.3 8.1*     Studies/Results: No results found.  Assessment/Plan: Postop day 1 status post robotic sacrocolpopexy with Dr. Louis Meckel  -OOBTC and ambulate -Advance diet as tolerated -Likely discharge later today   LOS: 0 days   Ellison Hughs, MD [facility] Pager: [phone]  09/26/2022, 10:49 AM

## 2022-09-28 ENCOUNTER — Encounter (HOSPITAL_COMMUNITY): Payer: Self-pay | Admitting: Urology

## 2022-09-28 LAB — SURGICAL PATHOLOGY

## 2022-09-30 NOTE — Discharge Summary (Signed)
Date of admission: 09/25/2022  Date of discharge: 09/26/2022  Admission diagnosis: Pelvic organ prolapse  Discharge diagnosis: Same  Procedure: Robotic assisted laparoscopic sacrocolpopexy Bilateral salpingo-oopherectomy  History and Physical: For full details, please see admission history and physical. Briefly, [name] Branch is a 66 y.o. year old patient with POP.   Hospital Course: Routine post-op course following robotic sacrocolpopexy and BSO with Dr. Louis Meckel.   Physical Exam:  General: Alert and oriented CV: RRR, palpable distal pulses Lungs: CTAB, equal chest rise Abdomen: Soft, NTND, no rebound or guarding Incisions: c/d/i Ext: NT, No erythema  Laboratory values: No results for input(s): "HGB", "HCT" in the last 72 hours. No results for input(s): "CREATININE" in the last 72 hours.  Disposition: Home  Discharge instruction: The patient was instructed to be ambulatory but told to refrain from heavy lifting, strenuous activity, or driving.  Discharge medications:  Allergies as of 09/26/2022       Reactions   Other Nausea And Vomiting   All Antibiotics   Sulfonamide Derivatives Nausea And Vomiting        Medication List     TAKE these medications    cyanocobalamin 1000 MCG tablet Commonly known as: VITAMIN B12 Place 1,000 mcg under the tongue daily.   docusate sodium 100 MG capsule Commonly known as: COLACE Take 1 capsule (100 mg total) by mouth 2 (two) times daily.   estradiol 0.1 MG/GM vaginal cream Commonly known as: ESTRACE VAGINAL Place 1 Applicatorful vaginally 3 (three) times a week. Use 1 small dolyp of cream on tip of index finger and swap the inside of the vagina   HAIR SKIN AND NAILS FORMULA PO Take 1 tablet by mouth daily.   HM MAGNESIUM CITRATE PO Take 2 tablets by mouth daily.   hydroxypropyl methylcellulose / hypromellose 2.5 % ophthalmic solution Commonly known as: ISOPTO TEARS / GONIOVISC Place 1 drop into both eyes as needed for  dry eyes.   iron polysaccharides 150 MG capsule Commonly known as: NIFEREX Take 1 capsule (150 mg total) by mouth 2 (two) times daily. With food   ketoconazole 2 % cream Commonly known as: NIZORAL APPLY 1 APPLICATION. TOPICALLY DAILY. TO AFFECTED AREA   morphine 30 MG tablet Commonly known as: MSIR Take 1 tablet (30 mg total) by mouth every 6 (six) hours as needed for up to 5 days for severe pain. To be taken for post-op (surgical pain) x 5 days What changed:  when to take this reasons to take this additional instructions   multivitamin tablet Take 1 tablet by mouth daily.   nitrofurantoin (macrocrystal-monohydrate) 100 MG capsule Commonly known as: MACROBID TAKE 1 CAPSULE BY MOUTH EVERY DAY   ondansetron 4 MG tablet Commonly known as: Zofran Take 1 tablet (4 mg total) by mouth every 8 (eight) hours as needed for nausea or vomiting.   pantoprazole 40 MG tablet Commonly known as: PROTONIX Take 1 tablet (40 mg total) by mouth 2 (two) times daily before a meal.   polyethylene glycol 17 g packet Commonly known as: MIRALAX / GLYCOLAX Take 17 g by mouth daily.   promethazine 25 MG tablet Commonly known as: PHENERGAN Take 1 tablet (25 mg total) by mouth 3 (three) times daily as needed for nausea or vomiting.   VITAMIN C PO Take 1,000 mg by mouth daily.   Vitamin D 125 MCG (5000 UT) Caps Take 5,000 Units by mouth daily.   vitamin E 180 MG (400 UNITS) capsule Commonly known as: vitamin E Take 2  capsules (800 Units total) by mouth daily.       ASK your doctor about these medications    cephALEXin 500 MG capsule Commonly known as: KEFLEX Take 1 capsule (500 mg total) by mouth 3 (three) times daily for 3 days. Ask about: Should I take this medication?               Discharge Care Instructions  (From admission, onward)           Start     Ordered   09/26/22 0000  Discharge wound care:       Comments: Okay to shower.  Keep incisions clean and dry    09/26/22 1053            Followup:   Follow-up Information     Hollace Hayward, NP Follow up on 10/09/2022.   Contact information: [address] [place] [address] [phone]

## 2022-10-15 ENCOUNTER — Encounter: Payer: Self-pay | Admitting: Family Medicine

## 2022-10-20 NOTE — Telephone Encounter (Signed)
Form printed and placed in your inbox for review

## 2022-10-21 NOTE — Telephone Encounter (Signed)
Done and in IN box 

## 2022-10-24 ENCOUNTER — Other Ambulatory Visit: Payer: Self-pay | Admitting: Family Medicine

## 2022-10-27 NOTE — Telephone Encounter (Signed)
Will route to GI who filled med

## 2022-10-28 ENCOUNTER — Other Ambulatory Visit (INDEPENDENT_AMBULATORY_CARE_PROVIDER_SITE_OTHER): Payer: 59

## 2022-10-28 DIAGNOSIS — D509 Iron deficiency anemia, unspecified: Secondary | ICD-10-CM

## 2022-10-28 LAB — HEMOCCULT SLIDES (X 3 CARDS)
Fecal Occult Blood: NEGATIVE
OCCULT 1: NEGATIVE
OCCULT 2: NEGATIVE
OCCULT 3: NEGATIVE
OCCULT 4: NEGATIVE
OCCULT 5: NEGATIVE

## 2022-11-10 ENCOUNTER — Encounter: Payer: Self-pay | Admitting: Family Medicine

## 2022-11-12 ENCOUNTER — Other Ambulatory Visit: Payer: Self-pay | Admitting: Internal Medicine

## 2022-11-18 ENCOUNTER — Other Ambulatory Visit: Payer: Self-pay | Admitting: Family Medicine

## 2022-11-19 NOTE — Telephone Encounter (Signed)
Last OV was 09/01/22, last filled on 05/22/22 #30 caps with 5 refills

## 2022-12-02 ENCOUNTER — Encounter: Payer: Self-pay | Admitting: Oncology

## 2022-12-09 ENCOUNTER — Inpatient Hospital Stay: Payer: Medicare Other

## 2022-12-11 ENCOUNTER — Inpatient Hospital Stay: Payer: Medicare Other

## 2022-12-14 ENCOUNTER — Inpatient Hospital Stay: Payer: Medicare Other

## 2022-12-14 ENCOUNTER — Inpatient Hospital Stay: Payer: Medicare Other | Admitting: Oncology

## 2023-04-26 ENCOUNTER — Emergency Department: Payer: Medicare Other

## 2023-04-26 ENCOUNTER — Encounter: Payer: Self-pay | Admitting: Oncology

## 2023-04-26 ENCOUNTER — Other Ambulatory Visit: Payer: Self-pay

## 2023-04-26 ENCOUNTER — Emergency Department
Admission: EM | Admit: 2023-04-26 | Discharge: 2023-04-26 | Disposition: A | Discharge status: Home / Self Care | Payer: Medicare Other | Attending: Emergency Medicine | Admitting: Emergency Medicine

## 2023-04-26 DIAGNOSIS — A045 Campylobacter enteritis: Secondary | ICD-10-CM | POA: Diagnosis not present

## 2023-04-26 DIAGNOSIS — R8271 Bacteriuria: Secondary | ICD-10-CM | POA: Insufficient documentation

## 2023-04-26 DIAGNOSIS — A0832 Astrovirus enteritis: Secondary | ICD-10-CM | POA: Insufficient documentation

## 2023-04-26 DIAGNOSIS — S9032XA Contusion of left foot, initial encounter: Secondary | ICD-10-CM | POA: Diagnosis not present

## 2023-04-26 DIAGNOSIS — W1843XA Slipping, tripping and stumbling without falling due to stepping from one level to another, initial encounter: Secondary | ICD-10-CM | POA: Diagnosis not present

## 2023-04-26 DIAGNOSIS — R531 Weakness: Secondary | ICD-10-CM

## 2023-04-26 DIAGNOSIS — S99922A Unspecified injury of left foot, initial encounter: Secondary | ICD-10-CM | POA: Diagnosis present

## 2023-04-26 LAB — COMPREHENSIVE METABOLIC PANEL
ALT: 20 U/L (ref 0–44)
AST: 47 U/L — ABNORMAL HIGH (ref 15–41)
Albumin: 3.8 g/dL (ref 3.5–5.0)
Alkaline Phosphatase: 88 U/L (ref 38–126)
Anion gap: 13 (ref 5–15)
BUN: 8 mg/dL (ref 8–23)
CO2: 19 mmol/L — ABNORMAL LOW (ref 22–32)
Calcium: 8.9 mg/dL (ref 8.9–10.3)
Chloride: 103 mmol/L (ref 98–111)
Creatinine, Ser: 0.49 mg/dL (ref 0.44–1.00)
GFR, Estimated: >60 mL/min (ref >60)
Glucose, Bld: 116 mg/dL — ABNORMAL HIGH (ref 70–99)
Potassium: 3.5 mmol/L (ref 3.5–5.1)
Sodium: 135 mmol/L (ref 135–145)
Total Bilirubin: 3 mg/dL — ABNORMAL HIGH (ref 0.3–1.2)
Total Protein: 7.4 g/dL (ref 6.5–8.1)

## 2023-04-26 LAB — CBC WITH DIFFERENTIAL/PLATELET
Abs Immature Granulocytes: 0.02 10*3/uL (ref 0.00–0.07)
Basophils Absolute: 0 10*3/uL (ref 0.0–0.1)
Basophils Relative: 1 %
Eosinophils Absolute: 0 10*3/uL (ref 0.0–0.5)
Eosinophils Relative: 0 %
HCT: 33 % — ABNORMAL LOW (ref 36.0–46.0)
Hemoglobin: 10.2 g/dL — ABNORMAL LOW (ref 12.0–15.0)
Immature Granulocytes: 1 %
Lymphocytes Relative: 23 %
Lymphs Abs: 0.9 10*3/uL (ref 0.7–4.0)
MCH: 24.6 pg — ABNORMAL LOW (ref 26.0–34.0)
MCHC: 30.9 g/dL (ref 30.0–36.0)
MCV: 79.7 fL — ABNORMAL LOW (ref 80.0–100.0)
Monocytes Absolute: 0.4 10*3/uL (ref 0.1–1.0)
Monocytes Relative: 9 %
Neutro Abs: 2.5 10*3/uL (ref 1.7–7.7)
Neutrophils Relative %: 66 %
Platelets: 168 10*3/uL (ref 150–400)
RBC: 4.14 MIL/uL (ref 3.87–5.11)
RDW: 15.8 % — ABNORMAL HIGH (ref 11.5–15.5)
WBC: 3.8 10*3/uL — ABNORMAL LOW (ref 4.0–10.5)
nRBC: 0 % (ref 0.0–0.2)

## 2023-04-26 LAB — GASTROINTESTINAL PANEL BY PCR, STOOL (REPLACES STOOL CULTURE)
Adenovirus F40/41: NOT DETECTED
Astrovirus: DETECTED — AB
Campylobacter species: DETECTED — AB
Cryptosporidium: NOT DETECTED
Cyclospora cayetanensis: NOT DETECTED
Entamoeba histolytica: NOT DETECTED
Enteroaggregative E coli (EAEC): NOT DETECTED
Enteropathogenic E coli (EPEC): NOT DETECTED
Enterotoxigenic E coli (ETEC): NOT DETECTED
Giardia lamblia: NOT DETECTED
Norovirus GI/GII: NOT DETECTED
Plesimonas shigelloides: NOT DETECTED
Rotavirus A: NOT DETECTED
Salmonella species: NOT DETECTED
Sapovirus (I, II, IV, and V): NOT DETECTED
Shiga like toxin producing E coli (STEC): NOT DETECTED
Shigella/Enteroinvasive E coli (EIEC): NOT DETECTED
Vibrio cholerae: NOT DETECTED
Vibrio species: NOT DETECTED
Yersinia enterocolitica: NOT DETECTED

## 2023-04-26 LAB — C DIFFICILE QUICK SCREEN W PCR REFLEX
C Diff antigen: NEGATIVE
C Diff interpretation: NOT DETECTED
C Diff toxin: NEGATIVE

## 2023-04-26 LAB — URINALYSIS, ROUTINE W REFLEX MICROSCOPIC
Bilirubin Urine: NEGATIVE
Glucose, UA: NEGATIVE mg/dL
Hgb urine dipstick: NEGATIVE
Ketones, ur: 20 mg/dL — AB
Nitrite: NEGATIVE
Protein, ur: NEGATIVE mg/dL
Specific Gravity, Urine: 1.006 (ref 1.005–1.030)
pH: 7 (ref 5.0–8.0)

## 2023-04-26 LAB — LIPASE, BLOOD: Lipase: 30 U/L (ref 11–51)

## 2023-04-26 MED ORDER — LACTATED RINGERS IV BOLUS
1000.0000 mL | Freq: Once | INTRAVENOUS | Status: AC
  Administered 2023-04-26: 1000 mL via INTRAVENOUS

## 2023-04-26 MED ORDER — ONDANSETRON HCL 4 MG/2ML IJ SOLN
4.0000 mg | INTRAMUSCULAR | Status: AC
  Administered 2023-04-26: 4 mg via INTRAVENOUS
  Filled 2023-04-26: qty 2

## 2023-04-26 MED ORDER — ONDANSETRON 4 MG PO TBDP: ORAL_TABLET | ORAL | 0 refills | Status: DC

## 2023-04-26 MED ORDER — IOHEXOL 300 MG/ML  SOLN: 100.0000 mL | Freq: Once | INTRAMUSCULAR | Status: DC | PRN

## 2023-04-26 MED ORDER — DROPERIDOL 2.5 MG/ML IJ SOLN
1.2500 mg | Freq: Once | INTRAMUSCULAR | Status: AC
  Administered 2023-04-26: 1.25 mg via INTRAVENOUS
  Filled 2023-04-26: qty 2

## 2023-04-26 MED ORDER — AZITHROMYCIN 500 MG PO TABS: 500.0000 mg | ORAL_TABLET | Freq: Every day | ORAL | 0 refills | Status: AC

## 2023-04-26 NOTE — ED Provider Notes (Signed)
[facility] Provider Note    Event Date/Time   First MD Initiated Contact with Patient 04/26/23 [phone]     (approximate)   History   Foot Injury and Weakness   HPI [name] Branch is a 67 y.o. female who presents for evaluation of general malaise, nausea, vomiting, and diarrhea.  The patient says that "I have been sick for 3 weeks".  However her symptoms got worse over the last few days with the development of the diarrhea on top of the general malaise and nausea and vomiting she has been experiencing.  She says she is unable to eat or drink much of anything, even water, without vomiting it back up.  She denies fever, chest pain, shortness of breath.  She has not been having any urinary discomfort.  Of note, she was trying to get her self downstairs after being "bedridden for months".  She said that she is slipped and hurt her left foot and feels like she felt a pop.     Physical Exam   Triage Vital Signs: ED Triage Vitals  Enc Vitals Group     BP 04/26/23 0212 (!) 120/58     Pulse Rate 04/26/23 0212 100     Resp 04/26/23 0212 20     Temp 04/26/23 0212 98 F (36.7 C)     Temp Source 04/26/23 0212 Oral     SpO2 04/26/23 0212 98 %     Weight 04/26/23 0210 79.4 kg (175 lb)     Height 04/26/23 0210 1.676 m (5\' 6" )     Head Circumference --      Peak Flow --      Pain Score 04/26/23 0210 5     Pain Loc --      Pain Edu? --      Excl. in GC? --     Most recent vital signs: Vitals:   04/26/23 0212  BP: (!) 120/58  Pulse: 100  Resp: 20  Temp: 98 F (36.7 C)  SpO2: 98%    General: Awake, no distress.  Appears chronically ill but not currently in distress. CV:  Good peripheral perfusion.  Borderline tachycardia, regular rhythm.  Normal heart sounds. Resp:  Normal effort. Speaking easily and comfortably, no accessory muscle usage nor intercostal retractions.  Lungs are clear to auscultation. Abd:  No distention.  Mild generalized tenderness to  palpation throughout the abdomen.  Some guarding.  Nonperitoneal exam. MSK:  No swelling, bruising, or gross deformity to left foot and ankle.  Patient reports little bit of tenderness to palpation of the midfoot, otherwise normal exam.   ED Results / Procedures / Treatments   Labs (all labs ordered are listed, but only abnormal results are displayed) Labs Reviewed  GASTROINTESTINAL PANEL BY PCR, STOOL (REPLACES STOOL CULTURE) - Abnormal; Notable for the following components:      Result Value   Campylobacter species DETECTED (*)    Astrovirus DETECTED (*)    All other components within normal limits  CBC WITH DIFFERENTIAL/PLATELET - Abnormal; Notable for the following components:   WBC 3.8 (*)    Hemoglobin 10.2 (*)    HCT 33.0 (*)    MCV 79.7 (*)    MCH 24.6 (*)    RDW 15.8 (*)    All other components within normal limits  COMPREHENSIVE METABOLIC PANEL - Abnormal; Notable for the following components:   CO2 19 (*)    Glucose, Bld 116 (*)    AST 47 (*)  Total Bilirubin 3.0 (*)    All other components within normal limits  URINALYSIS, ROUTINE W REFLEX MICROSCOPIC - Abnormal; Notable for the following components:   Color, Urine YELLOW (*)    APPearance CLEAR (*)    Ketones, ur 20 (*)    Leukocytes,Ua TRACE (*)    Bacteria, UA MANY (*)    All other components within normal limits  C DIFFICILE QUICK SCREEN W PCR REFLEX    URINE CULTURE  LIPASE, BLOOD  MISCELLANEOUS TEST     EKG  ED ECG REPORT I, Loleta Rose, the attending physician, personally viewed and interpreted this ECG.  Date: 04/26/2023 EKG Time: 2:15 AM Rate: 103 Rhythm: Mild sinus tachycardia QRS Axis: normal Intervals: Mild prolonged QT interval ST/T Wave abnormalities: Non-specific ST segment / T-wave changes, but no clear evidence of acute ischemia. Narrative Interpretation: no definitive evidence of acute ischemia; does not meet STEMI criteria.    RADIOLOGY I viewed and interpreted the patient's  left foot x-rays and there is no evidence of fracture nor dislocation.     PROCEDURES:  Critical Care performed: No  Procedures    IMPRESSION / MDM / ASSESSMENT AND PLAN / ED COURSE  I reviewed the triage vital signs and the nursing notes.                              Differential diagnosis includes, but is not limited to, viral GI infection, C. difficile, partial SBO, ileus, intra-abdominal bacterial infection, electrolyte or metabolic abnormality including possible kidney injury.  Patient's presentation is most consistent with acute presentation with potential threat to life or bodily function.  Labs/studies ordered: CT of the abdomen pelvis, left foot x-rays, CMP, GI pathogen panel, C. difficile PCR, lipase, CBC with differential.  Interventions/Medications given:  Medications  iohexol (OMNIPAQUE) 300 MG/ML solution 100 mL (has no administration in time range)  ondansetron (ZOFRAN) injection 4 mg (4 mg Intravenous Given 04/26/23 0241)  droperidol (INAPSINE) 2.5 MG/ML injection 1.25 mg (1.25 mg Intravenous Given 04/26/23 0356)  lactated ringers bolus 1,000 mL (1,000 mLs Intravenous New Bag/Given 04/26/23 0506)    (Note:  hospital course my include additional interventions and/or labs/studies not listed above.)  Patient's vital signs are stable other than some mild tachycardia.  I ordered 1 L LR IV bolus Zofran 4 mg IV.  Foot x-ray normal with no clinical evidence of fracture as well.  CBC and CMP are essentially normal other than a mild total bilirubin of 3, but she has been elevated at around 2 in the past.  Very slightly low CO2, but likely not clinically significant.  The patient is on the cardiac monitor to evaluate for evidence of arrhythmia and/or significant heart rate changes.  Ordered a CT of the abdomen and pelvis but the patient refused due to "severe claustrophobia and chronic back pain".   Clinical Course as of 04/26/23 0659  Mon Apr 26, 2023  1610 C Difficile  Quick Screen w PCR reflex Negative C. difficile panel [CF]  0620 Patient's stool studies are notable for positive Campylobacter and positive astrovirus.  Both of these should be self-limited.  However given the patient's chronic issues, she may benefit from empiric treatment.  Given the common resistance with ciprofloxacin, I will give her azithromycin 500 mg PO daily x 3 days [CF]  0624 I talked with the patient and her husband about the results and they understand the plan for discharge and outpatient follow-up.  I encouraged rehydration, use of Zofran, and prescription for the azithromycin.  I encouraged close outpatient follow-up and gave my usual and customary return precautions. [CF]    Clinical Course User Index [CF] Loleta Rose, MD     FINAL CLINICAL IMPRESSION(S) / ED DIAGNOSES   Final diagnoses:  Generalized weakness  Contusion of left foot, initial encounter  Campylobacter diarrhea  Astrovirus gastroenteritis     Rx / DC Orders   ED Discharge Orders          Ordered    ondansetron (ZOFRAN-ODT) 4 MG disintegrating tablet        04/26/23 0628    azithromycin (ZITHROMAX) 500 MG tablet  Daily        04/26/23 1478             Note:  This document was prepared using Dragon voice recognition software and may include unintentional dictation errors.   Loleta Rose, MD 04/26/23 [phone]

## 2023-04-26 NOTE — Discharge Instructions (Signed)
Please take your regular medications and the full course of azithromycin we have prescribed for the Campylobacter infection found in your stool.  The astrovirus stool infection should resolve on its own.  Please use the prescribed Zofran as needed for nausea and vomiting and try to stay hydrated with electrolyte fluids such as Pedialyte.  Follow-up with your regular doctor and return to the emergency department if you develop new or worsening symptoms that concern you.

## 2023-04-26 NOTE — ED Notes (Signed)
 from bladder with in/out cath

## 2023-04-26 NOTE — ED Notes (Signed)
York Cerise, MD notifies on + stool sample

## 2023-04-26 NOTE — ED Triage Notes (Signed)
Pt to ED via EMS from home, pt reports n/v xfew days and weakness. Pt states when she was walking down the stairs to call 911 she tripped and injured her left foot.

## 2023-04-27 ENCOUNTER — Encounter: Payer: Self-pay | Admitting: Family Medicine

## 2023-04-27 ENCOUNTER — Telehealth: Payer: Self-pay

## 2023-04-27 NOTE — Transitions of Care (Post Inpatient/ED Visit) (Signed)
   04/27/2023  Name: [name] Branch MRN: [id number] DOB: 06/06/1956  Today's TOC FU Call Status: Today's TOC FU Call Status:: Unsuccessul Call (1st Attempt) Unsuccessful Call (1st Attempt) Date: 04/27/23  Attempted to reach the patient regarding the most recent Inpatient/ED visit.  Follow Up Plan: Additional outreach attempts will be made to reach the patient to complete the Transitions of Care (Post Inpatient/ED visit) call.   Signature   Woodfin Ganja LPN [facility] Nurse Health Advisor Direct Dial [phone]

## 2023-04-28 LAB — URINE CULTURE: Culture: 80000 — AB

## 2023-04-28 NOTE — Transitions of Care (Post Inpatient/ED Visit) (Signed)
   04/28/2023  Name: [name] Branch MRN: [id number] DOB: 11-Aug-1956  Today's TOC FU Call Status: Today's TOC FU Call Status:: Unsuccessful Call (2nd Attempt) Unsuccessful Call (1st Attempt) Date: 04/27/23 Unsuccessful Call (2nd Attempt) Date: 04/28/23  Attempted to reach the patient regarding the most recent Inpatient/ED visit.  Follow Up Plan: Additional outreach attempts will be made to reach the patient to complete the Transitions of Care (Post Inpatient/ED visit) call.   Signature   Woodfin Ganja LPN [facility] Nurse Health Advisor Direct Dial [phone]

## 2023-04-30 NOTE — Progress Notes (Signed)
ED Antimicrobial Stewardship Positive Culture Follow Up   [name] Branch is an 67 y.o. female who presented to [facility] on 04/26/2023 with a chief complaint of  Chief Complaint  Patient presents with   Foot Injury   Weakness    Recent Results (from the past 720 hour(s))  Gastrointestinal Panel by PCR , Stool     Status: Abnormal   Collection Time: 04/26/23  4:01 AM   Specimen: Stool  Result Value Ref Range Status   Campylobacter species DETECTED (A) NOT DETECTED Final    Comment: RESULT CALLED TO, READ BACK BY AND VERIFIED WITH: MELANIE NASH 04/26/23 0605 MW    Plesimonas shigelloides NOT DETECTED NOT DETECTED Final   Salmonella species NOT DETECTED NOT DETECTED Final   Yersinia enterocolitica NOT DETECTED NOT DETECTED Final   Vibrio species NOT DETECTED NOT DETECTED Final   Vibrio cholerae NOT DETECTED NOT DETECTED Final   Enteroaggregative E coli (EAEC) NOT DETECTED NOT DETECTED Final   Enteropathogenic E coli (EPEC) NOT DETECTED NOT DETECTED Final   Enterotoxigenic E coli (ETEC) NOT DETECTED NOT DETECTED Final   Shiga like toxin producing E coli (STEC) NOT DETECTED NOT DETECTED Final   Shigella/Enteroinvasive E coli (EIEC) NOT DETECTED NOT DETECTED Final   Cryptosporidium NOT DETECTED NOT DETECTED Final   Cyclospora cayetanensis NOT DETECTED NOT DETECTED Final   Entamoeba histolytica NOT DETECTED NOT DETECTED Final   Giardia lamblia NOT DETECTED NOT DETECTED Final   Adenovirus F40/41 NOT DETECTED NOT DETECTED Final   Astrovirus DETECTED (A) NOT DETECTED Final   Norovirus GI/GII NOT DETECTED NOT DETECTED Final   Rotavirus A NOT DETECTED NOT DETECTED Final   Sapovirus (I, II, IV, and V) NOT DETECTED NOT DETECTED Final    Comment: Performed at [facility], [address] [place], [place] [address]  C Difficile Quick Screen w PCR reflex     Status: None   Collection Time: 04/26/23  4:01 AM   Specimen: Stool  Result Value Ref Range Status   C Diff antigen NEGATIVE  NEGATIVE Final   C Diff toxin NEGATIVE NEGATIVE Final   C Diff interpretation No C. difficile detected.  Final    Comment: Performed at [facility], [address] [place], [place] [address]  Urine Culture     Status: Abnormal   Collection Time: 04/26/23  5:09 AM   Specimen: In/Out Cath Urine  Result Value Ref Range Status   Specimen Description   Final    IN/OUT CATH URINE Performed at [facility], [address] [place], [place] [address]    Special Requests   Final    NONE Performed at [facility], [address] [place], [place] [address]    Culture 80,000 COLONIES/mL SERRATIA MARCESCENS (A)  Final   Report Status 04/28/2023 FINAL  Final   Organism ID, Bacteria SERRATIA MARCESCENS (A)  Final      Susceptibility   Serratia marcescens - MIC*    CEFEPIME <=0.12 SENSITIVE Sensitive     CEFTRIAXONE <=0.25 SENSITIVE Sensitive     CIPROFLOXACIN <=0.25 SENSITIVE Sensitive     GENTAMICIN <=1 SENSITIVE Sensitive     NITROFURANTOIN >=512 RESISTANT Resistant     TRIMETH/SULFA <=20 SENSITIVE Sensitive     * 80,000 COLONIES/mL SERRATIA MARCESCENS    Patient with foot pain after injuring self of stairs.  Urine cx came back as above, GI panel with campylobacter and atrovirus. Phamacist reached out to ED provider 74 regarding urine cx.  The UA did not have any pyruia and should not have reflexed to culture (appears provider went outside the preferred ordering of Urine cx).  No symptoms of UTI per notes.  Patient discharged on azithromycin for diarrhea (campylobacter)   Juliette Alcide, PharmD, BCPS, BCIDP 04/30/2023 10:38 AM

## 2023-04-30 NOTE — Telephone Encounter (Signed)
Patient returned call, requested a call back whenever possible on Monday morning.

## 2023-04-30 NOTE — Transitions of Care (Post Inpatient/ED Visit) (Signed)
Unable to reach pt by phone and left v/m for pt to cb [phone].      04/30/2023  Name: [name] Branch MRN: [id number] DOB: 1956/07/28  Today's TOC FU Call Status: Today's TOC FU Call Status:: Unsuccessful Call (3rd Attempt) Unsuccessful Call (1st Attempt) Date: 04/27/23 Unsuccessful Call (2nd Attempt) Date: 04/28/23 Unsuccessful Call (3rd Attempt) Date: 04/30/23  Attempted to reach the patient regarding the most recent Inpatient/ED visit.  Follow Up Plan: Additional outreach attempts will be made to reach the patient to complete the Transitions of Care (Post Inpatient/ED visit) call.   Signature  Lewanda Rife, LPN

## 2023-05-04 ENCOUNTER — Encounter: Payer: Self-pay | Admitting: Family Medicine

## 2023-05-04 ENCOUNTER — Ambulatory Visit: Payer: Medicare Other | Admitting: Family Medicine

## 2023-05-04 NOTE — Telephone Encounter (Signed)
Pt called back and please see TOC ED FU note.

## 2023-05-04 NOTE — Telephone Encounter (Signed)
Will route to nurse that does the hospital f/u calls since PCP isn't here this week

## 2023-05-04 NOTE — Telephone Encounter (Signed)
Unable to reach pt by phone and left v/m for pt to cb.please see my chart note as well. And I will my cvhart pt to also call office.

## 2023-05-04 NOTE — Transitions of Care (Post Inpatient/ED Visit) (Signed)
Pt seen [facility] ED for weakness, diarrhea and urinary issues 04/26/23. Pt still has fevers from 99 - 101 ongoing, 05/04/23 T 99.8. pt said continuously nauseated but has not vomited since 04/26/23 but pt taking ondansetron round the clock. Pt has finished z pak. Pt saw urine culture results and that is most concerning for pt. pt can't go down steps and cannot come in for appt and scheduled VV. advised pt reasons to go back to ED for continuing fevers, nausea and urinary concerns. pt refuses. I spoke wtih Allayne Gitelman NP who advises due to ongong fevers, nausea pt should go ED. Again pt voiced understanding but said she will have to get a lot worse before going back to ED. Advised pt she does not want to get worse and pt said she understands but will keep VV on 05/05/23. UC & ED precautions given again and pt voiced understanding. Sending note to Allayne Gitelman NP, [facility] and Dr Milinda Antis as Lorain Childes to PCP.      05/04/2023  Name: [name] Branch MRN: [id number] DOB: 1956/01/25  Today's TOC FU Call Status: Today's TOC FU Call Status:: Successful TOC FU Call Competed Unsuccessful Call (1st Attempt) Date: 04/27/23 Unsuccessful Call (2nd Attempt) Date: 04/28/23 Unsuccessful Call (3rd Attempt) Date: 05/04/23 (4th call) TOC FU Call Complete Date: 05/04/23  Transition Care Management Follow-up Telephone Call Date of Discharge: 04/26/23 Discharge Facility: [facility]) Type of Discharge: Emergency Department Reason for ED Visit: Other: (diarrhea and diffuculty urinating and vomiting.) How have you been since you were released from the hospital?: Better Any questions or concerns?: Yes Patient Questions/Concerns:: pt concerned about urine culture result. Patient Questions/Concerns Addressed: Notified Provider of Patient Questions/Concerns  Items Reviewed: Did you receive and understand the discharge instructions provided?: Yes Medications obtained,verified, and reconciled?: Partial Review  Completed (pt has finished z pak;pt taking ondansetron is helping with nausea. no vomiting since 04/26/23. only discussed medfs related to ED.) Reason for Partial Mediation Review: pt only discussed meds related to ED visit at this time. Any new allergies since your discharge?: No Dietary orders reviewed?: NA Do you have support at home?: Yes People in Home: spouse Name of Support/Comfort Primary Source: Theron Arista  Medications Reviewed Today: Medications Reviewed Today     Reviewed by Levonne Hubert, RN (Registered Nurse) on 09/25/22 at [phone]  Med List Status: Complete   Medication Order Taking? Sig Documenting Provider Last Dose Status Informant  Ascorbic Acid (VITAMIN C PO) [id number] Yes Take 1,000 mg by mouth daily. [provider] 09/11/2022 Active Self  Cholecalciferol (VITAMIN D) 125 MCG (5000 UT) CAPS [id number] Yes Take 5,000 Units by mouth daily. [provider] 09/11/2022 Active Self  docusate sodium (COLACE) 100 MG capsule [id number] Yes Take 1 capsule (100 mg total) by mouth 2 (two) times daily. Vanna Scotland, MD 09/11/2022 Active Self  HM MAGNESIUM CITRATE PO [id number] Yes Take 2 tablets by mouth daily. [provider] 09/11/2022 Active Self  hydroxypropyl methylcellulose / hypromellose (ISOPTO TEARS / GONIOVISC) 2.5 % ophthalmic solution [id number] Yes Place 1 drop into both eyes as needed for dry eyes. [provider] Past Week Active Self  iron polysaccharides (NIFEREX) 150 MG capsule [id number]  Take 1 capsule (150 mg total) by mouth 2 (two) times daily. With food  Patient not taking: Reported on 09/09/2022   Tower, Audrie Gallus, MD  Active Self  ketoconazole (NIZORAL) 2 % cream [id number] Yes APPLY 1 APPLICATION. TOPICALLY DAILY. TO AFFECTED AREA Tower, Audrie Gallus, MD  Past Week Active Self  morphine (MSIR) 30 MG tablet [id number] Yes Take 30 mg by mouth in the morning, at noon, and at bedtime. UAD PRN [provider] 09/24/2022 Active Self   Multiple Vitamin (MULTIVITAMIN) tablet [id number] Yes Take 1 tablet by mouth daily. [provider] 09/11/2022 Active Self  Multiple Vitamins-Minerals (HAIR SKIN AND NAILS FORMULA PO) [id number] Yes Take 1 tablet by mouth daily. [provider] 09/11/2022 Active Self  nitrofurantoin, macrocrystal-monohydrate, (MACROBID) 100 MG capsule [id number] Yes TAKE 1 CAPSULE BY MOUTH EVERY DAY Tower, Audrie Gallus, MD Past Week Active Self  pantoprazole (PROTONIX) 40 MG tablet [id number] Yes Take 1 tablet (40 mg total) by mouth 2 (two) times daily before a meal. Beverley Fiedler, MD 09/25/2022 2330 Active Self  polyethylene glycol (MIRALAX / GLYCOLAX) 17 g packet [id number] Yes Take 17 g by mouth daily. [provider] Past Month Active Self  promethazine (PHENERGAN) 25 MG tablet [id number] Yes Take 1 tablet (25 mg total) by mouth 3 (three) times daily as needed for nausea or vomiting. Beverley Fiedler, MD 09/24/2022 Active Self  vitamin B-12 (CYANOCOBALAMIN) 1000 MCG tablet [id number] Yes Place 1,000 mcg under the tongue daily. [provider] 09/11/2022 Active Self  vitamin E 180 MG (400 UNITS) capsule [id number] No Take 2 capsules (800 Units total) by mouth daily. Beverley Fiedler, MD 09/11/2022 Active Self           Med Note Richardean Canal Sep 10, 2022  9:28 AM) On hold for procedure            Home Care and Equipment/Supplies: Were Home Health Services Ordered?: NA Any new equipment or medical supplies ordered?: NA  Functional Questionnaire: Do you need assistance with bathing/showering or dressing?: Yes (pt fell long time ago and has back fx so husband helps pt with dressing and meal prep.) Do you need assistance with meal preparation?: Yes (see above) Do you need assistance with eating?: No Do you have difficulty maintaining continence: Yes (pt had surgery for urinary continence but has only helped slightly.) Do you need assistance with getting out of bed/getting out of a  chair/moving?: Yes Do you have difficulty managing or taking your medications?: Yes (pts husband helps pt take her meds.)  Follow up appointments reviewed: PCP Follow-up appointment confirmed?: Yes Date of PCP follow-up appointment?: 05/05/23 Follow-up Provider: Mayra Reel NP [facility] Follow-up appointment confirmed?: NA Do you need transportation to your follow-up appointment?: No (pt  can't go down steps and cannot come in for appt and scheduled VV. advised pt reasons to go back to ED for continuing fevers, nausea and urinary concerns. pt refuses. I spoke wtih Allayne Gitelman NP who advises due to ongong fevers, nausea pt should go ED.) Do you understand care options if your condition(s) worsen?: Yes-patient verbalized understanding    SIGNATURE Lewanda Rife, LPN

## 2023-05-04 NOTE — Telephone Encounter (Signed)
I am out of the office/ out of town and aware Agree with advisement and understand she refuses ER now  If she cannot get out of her house- only option is to call 911

## 2023-05-04 NOTE — Transitions of Care (Post Inpatient/ED Visit) (Signed)
Unable to reach pt by phone and left v/m requesting cb [phone]. Also mycharted cb request on pt my chart note on 05/04/23      05/04/2023  Name: [name] Branch MRN: [id number] DOB: 07-15-1956  Today's TOC FU Call Status: Today's TOC FU Call Status:: Unsuccessful Call (3rd Attempt) Unsuccessful Call (1st Attempt) Date: 04/27/23 Unsuccessful Call (2nd Attempt) Date: 04/28/23 Unsuccessful Call (3rd Attempt) Date: 05/04/23 (4th call)  Attempted to reach the patient regarding the most recent Inpatient/ED visit.  Follow Up Plan: Additional outreach attempts will be made to reach the patient to complete the Transitions of Care (Post Inpatient/ED visit) call.   Signature  Lewanda Rife, LPN

## 2023-05-05 ENCOUNTER — Telehealth: Payer: Self-pay

## 2023-05-05 ENCOUNTER — Telehealth: Payer: Medicare Other | Admitting: Primary Care

## 2023-05-05 ENCOUNTER — Encounter: Payer: Self-pay | Admitting: Family Medicine

## 2023-05-05 NOTE — Telephone Encounter (Signed)
Thanks for the heads up  Please check in and make sure she gets in touch with urology since I am out of town Thanks

## 2023-05-05 NOTE — Telephone Encounter (Signed)
Turned into phone note for provider review.

## 2023-05-05 NOTE — Telephone Encounter (Signed)
Sent via my chart message from patient.    Glad we caught up w/each other. Thanks for b.d.wishes! Very nice. Not wise talking after 20 hrs./no sleep. Happens when one pees every 10 minutes. Did misunderstand purpose of call. ER said followup w/Dr.Tower if I had questions which I did. Thought you were only checking if I felt better.  I'm always truthful&polite but w/o sleep not always clear.  I mentioned fluctuating temps, highest 101.5.  Was 4 weeks ago when I first got sick. It broke after 3 days. Hubby said that wasn't clear. In ER temp normal. Fluctuating temps part of my chronic disease. Regardless ER ran all I expected & then some.  With Dr.Tower out this week will see if urology surgeon will prescribe rx since he's aware of previous bout. Until UTI treated, peeing every 10 minutes not conducive to on camera visit. Something else no sleep made me forget.  Hope you forgive my brain fog. Since unsure when you'll read, will also call 1st thing.

## 2023-05-21 ENCOUNTER — Encounter: Payer: Self-pay | Admitting: Family Medicine

## 2023-05-26 ENCOUNTER — Encounter (INDEPENDENT_AMBULATORY_CARE_PROVIDER_SITE_OTHER): Payer: Self-pay

## 2023-06-02 ENCOUNTER — Ambulatory Visit (INDEPENDENT_AMBULATORY_CARE_PROVIDER_SITE_OTHER): Payer: Medicare Other

## 2023-06-02 VITALS — Ht 66.0 in | Wt 175.0 lb

## 2023-06-02 DIAGNOSIS — Z780 Asymptomatic menopausal state: Secondary | ICD-10-CM | POA: Diagnosis not present

## 2023-06-02 DIAGNOSIS — Z1231 Encounter for screening mammogram for malignant neoplasm of breast: Secondary | ICD-10-CM | POA: Diagnosis not present

## 2023-06-02 DIAGNOSIS — Z0000 Encounter for general adult medical examination without abnormal findings: Secondary | ICD-10-CM | POA: Diagnosis not present

## 2023-06-02 NOTE — Progress Notes (Signed)
Subjective:   [name] Branch is a 67 y.o. female who presents for Medicare Annual (Subsequent) preventive examination.  Visit Complete: Virtual  I connected with  Nelida Meuse on 06/02/23 by a audio enabled telemedicine application and verified that I am speaking with the correct person using two identifiers.  Patient Location: Home  Provider Location: Office/Clinic  I discussed the limitations of evaluation and management by telemedicine. The patient expressed understanding and agreed to proceed.  Patient Medicare AWV questionnaire was completed by the patient on 05/30/23; I have confirmed that all information answered by patient is correct and no changes since this date.  Review of Systems      Cardiac Risk Factors include: advanced age (>81men, >96 women);sedentary lifestyle     Objective:    Today's Vitals   06/02/23 1406 06/02/23 1407  Weight: 175 lb (79.4 kg)   Height: 5\' 6"  (1.676 m)   PainSc:  7    Body mass index is 28.25 kg/m.     06/02/2023    2:20 PM 04/26/2023    2:11 AM 09/25/2022    2:00 PM 09/25/2022    6:45 AM 09/11/2022   11:25 AM 09/08/2022    3:03 PM 05/14/2022    3:40 PM  Advanced Directives  Does Patient Have a Medical Advance Directive? Yes No Yes Yes Yes Yes Yes  Type of Estate agent of [place];Living will  Healthcare Power of [employer] of [place];Living will Healthcare Power of [place];Living will Healthcare Power of [place];Living will Healthcare Power of [place];Living will  Does patient want to make changes to medical advance directive?   No - Patient declined      Copy of Healthcare Power of Attorney in Chart? No - copy requested    Yes - validated most recent copy scanned in chart (See row information)  No - copy requested    Current Medications (verified) Outpatient Encounter Medications as of 06/02/2023  Medication Sig   Cholecalciferol (VITAMIN D) 125 MCG (5000 UT) CAPS Take 5,000 Units by  mouth daily.   docusate sodium (COLACE) 100 MG capsule Take 1 capsule (100 mg total) by mouth 2 (two) times daily.   HM MAGNESIUM CITRATE PO Take 2 tablets by mouth daily.   Multiple Vitamin (MULTIVITAMIN) tablet Take 1 tablet by mouth daily.   Multiple Vitamins-Minerals (HAIR SKIN AND NAILS FORMULA PO) Take 1 tablet by mouth daily.   nitrofurantoin, macrocrystal-monohydrate, (MACROBID) 100 MG capsule TAKE 1 CAPSULE BY MOUTH EVERY DAY   pantoprazole (PROTONIX) 40 MG tablet TAKE 1 TABLET (40 MG TOTAL) BY MOUTH TWICE A DAY BEFORE MEALS   promethazine (PHENERGAN) 25 MG tablet TAKE 1 TABLET BY MOUTH EVERY 8 HOURS AS NEEDED FOR NAUSEA OR VOMITING.   vitamin B-12 (CYANOCOBALAMIN) 1000 MCG tablet Place 1,000 mcg under the tongue daily.   Ascorbic Acid (VITAMIN C PO) Take 1,000 mg by mouth daily.   estradiol (ESTRACE VAGINAL) 0.1 MG/GM vaginal cream Place 1 Applicatorful vaginally 3 (three) times a week. Use 1 small dolyp of cream on tip of index finger and swap the inside of the vagina   hydroxypropyl methylcellulose / hypromellose (ISOPTO TEARS / GONIOVISC) 2.5 % ophthalmic solution Place 1 drop into both eyes as needed for dry eyes.   iron polysaccharides (NIFEREX) 150 MG capsule Take 1 capsule (150 mg total) by mouth 2 (two) times daily. With food (Patient not taking: Reported on 09/09/2022)   ketoconazole (NIZORAL) 2 % cream APPLY 1 APPLICATION. TOPICALLY DAILY.  TO AFFECTED AREA (Patient not taking: Reported on 06/02/2023)   ondansetron (ZOFRAN) 4 MG tablet Take 1 tablet (4 mg total) by mouth every 8 (eight) hours as needed for nausea or vomiting.   ondansetron (ZOFRAN-ODT) 4 MG disintegrating tablet Allow 1-2 tablets to dissolve in your mouth every 8 hours as needed for nausea/vomiting   polyethylene glycol (MIRALAX / GLYCOLAX) 17 g packet Take 17 g by mouth daily. (Patient not taking: Reported on 06/02/2023)   vitamin E 180 MG (400 UNITS) capsule Take 2 capsules (800 Units total) by mouth daily. (Patient  not taking: Reported on 06/02/2023)   No facility-administered encounter medications on file as of 06/02/2023.    Allergies (verified) Other and Sulfonamide derivatives   History: Past Medical History:  Diagnosis Date   Allergic rhinitis    Anemia    Arthritis    Chronic fatigue syndrome    CFIDs; also chronic joint and mucle pain (possible tik bourne illness?). Dr. Neldon Mc    Chronic gastritis    Dyspnea    GERD (gastroesophageal reflux disease)    Hepatic steatosis    History of kidney stones 05/2017   Hyperlipidemia    IBS (irritable bowel syndrome)    Lyme disease    "stari"   Migraine    Muscle twitching    Neuromuscular disorder (HCC)    Pneumonia    PONV (postoperative nausea and vomiting)    Pre-diabetes    Rectal prolapse    Schatzki's ring    Umbilical hernia    Vaginal prolapse    West Nile Virus infection 2003   Past Surgical History:  Procedure Laterality Date   5th R toe fx  '09   ABDOMINAL HYSTERECTOMY     BREAST BIOPSY  '04   neg; right   CHOLECYSTECTOMY  10/2011   COLONOSCOPY  12/02/2021   2017- 2- TAs   CYSTOSCOPY WITH STENT PLACEMENT Right 05/25/2017   Procedure: CYSTOSCOPY WITH STENT PLACEMENT;  Surgeon: Vanna Scotland, MD;  Location: [facility];  Service: Urology;  Laterality: Right;   CYSTOSCOPY WITH STENT PLACEMENT Right 06/21/2017   Procedure: CYSTOSCOPY WITH STENT EXCHANGE;  Surgeon: Vanna Scotland, MD;  Location: [facility];  Service: Urology;  Laterality: Right;   DIAGNOSTIC LAPAROSCOPY     EXTRACORPOREAL SHOCK WAVE LITHOTRIPSY Right 06/10/2017   Procedure: EXTRACORPOREAL SHOCK WAVE LITHOTRIPSY (ESWL);  Surgeon: Vanna Scotland, MD;  Location: [facility];  Service: Urology;  Laterality: Right;   HOLMIUM LASER APPLICATION Right 06/21/2017   Procedure: HOLMIUM LASER APPLICATION;  Surgeon: Vanna Scotland, MD;  Location: [facility];  Service: Urology;  Laterality: Right;   laprascopy     PARTIAL HYSTERECTOMY     bleeding   ROBOTIC ASSISTED  LAPAROSCOPIC SACROCOLPOPEXY N/A 09/25/2022   Procedure: XI ROBOTIC ASSISTED LAPAROSCOPIC SACROCOLPOPEXY; BILATERAL SALPINGOOPHERECTOMY;  Surgeon: Crist Fat, MD;  Location: [facility];  Service: Urology;  Laterality: N/A;  240 MINUTES NEEDED FOR CASE   TONSILLECTOMY     TONSILLECTOMY     ULNAR NERVE REPAIR     sx times 5   UPPER GASTROINTESTINAL ENDOSCOPY  12/04/2004   gastritis   URETEROSCOPY WITH HOLMIUM LASER LITHOTRIPSY Right 06/21/2017   Procedure: URETEROSCOPY WITH HOLMIUM LASER LITHOTRIPSY;  Surgeon: Vanna Scotland, MD;  Location: [facility];  Service: Urology;  Laterality: Right;   Family History  Problem Relation Age of Onset   Hypertension Mother    Cancer Mother        Darrick Grinder   Ulcers Mother    Diabetes  Father    Hypertension Father    Stroke Father    Cancer Father    Throat cancer Father    Bladder Cancer Father    Breast cancer Sister    Alcohol abuse Maternal Grandfather    Breast cancer Paternal Grandmother    Lung cancer Paternal Grandfather    Bipolar disorder Other        some family   Colon cancer Neg Hx    Esophageal cancer Neg Hx    Pancreatic cancer Neg Hx    Ulcerative colitis Neg Hx    Social History   Socioeconomic History   Marital status: Married    Spouse name: Not on file   Number of children: 0   Years of education: Not on file   Highest education level: Not on file  Occupational History   Occupation: owner  Tobacco Use   Smoking status: Never   Smokeless tobacco: Never   Tobacco comments:    non smoker  Vaping Use   Vaping status: Never Used  Substance and Sexual Activity   Alcohol use: No    Alcohol/week: 0.0 standard drinks of alcohol   Drug use: Never   Sexual activity: Not Currently  Other Topics Concern   Not on file  Social History Narrative   Runs a wildlife center. Married, no children.    Social Determinants of Health   Financial Resource Strain: Low Risk  (06/02/2023)   Overall Financial Resource Strain  (CARDIA)    Difficulty of Paying Living Expenses: Not hard at all  Food Insecurity: No Food Insecurity (06/02/2023)   Hunger Vital Sign    Worried About Running Out of Food in the Last Year: Never true    Ran Out of Food in the Last Year: Never true  Transportation Needs: No Transportation Needs (06/02/2023)   PRAPARE - Administrator, Civil Service (Medical): No    Lack of Transportation (Non-Medical): No  Physical Activity: Inactive (06/02/2023)   Exercise Vital Sign    Days of Exercise per Week: 0 days    Minutes of Exercise per Session: 0 min  Stress: No Stress Concern Present (06/02/2023)   [employer] of Occupational Health - Occupational Stress Questionnaire    Feeling of Stress : Not at all  Social Connections: Moderately Isolated (06/02/2023)   Social Connection and Isolation Panel [NHANES]    Frequency of Communication with Friends and Family: More than three times a week    Frequency of Social Gatherings with Friends and Family: Never    Attends Religious Services: Never    Database administrator or Organizations: No    Attends Engineer, structural: Never    Marital Status: Married    Tobacco Counseling Counseling given: Not Answered Tobacco comments: non smoker   Clinical Intake:  Pre-visit preparation completed: Yes  Pain : 0-10 Pain Score: 7  Pain Location: Generalized Pain Descriptors / Indicators: Aching, Stabbing Pain Onset: More than a month ago     BMI - recorded: 28.25 Nutritional Status: BMI 25 -29 Overweight Nutritional Risks: Nausea/ vomitting/ diarrhea Diabetes: No  How often do you need to have someone help you when you read instructions, pamphlets, or other written materials from your doctor or pharmacy?: 1 - Never  Interpreter Needed?: No  Information entered by :: C.Trenice Mesa LPN   Activities of Daily Living    05/30/2023    9:03 PM 09/25/2022    2:00 PM  In your present state of  health, do you have any difficulty  performing the following activities:  Hearing? 0 0  Vision? 0 0  Difficulty concentrating or making decisions? 0 0  Walking or climbing stairs? 1 1  Comment in wheelchair   Dressing or bathing? 1 0  Comment husband assists   Doing errands, shopping? 1 0  Comment Husband assists   Preparing Food and eating ? Y   Comment husband assists   Using the Toilet? N   Comment Only if has an accident   In the past six months, have you accidently leaked urine? Y   Comment wears pads   Do you have problems with loss of bowel control? N   Managing your Medications? N   Managing your Finances? N   Housekeeping or managing your Housekeeping? Y     Patient Care Team: Tower, Audrie Gallus, MD as PCP - General (Family Medicine)  Indicate any recent Medical Services you may have received from other than [facility] providers in the past year (date may be approximate).     Assessment:   This is a routine wellness examination for Orie.  Hearing/Vision screen Hearing Screening - Comments:: Denies hearing difficulties   Vision Screening - Comments:: Glasses - [facility] in [place] - UTD on eye exams  Dietary issues and exercise activities discussed:     Goals Addressed             This Visit's Progress    Patient Stated       Walk again       Depression Screen    06/02/2023    2:19 PM 05/14/2022    3:43 PM 08/28/2020    3:41 PM  PHQ 2/9 Scores  PHQ - 2 Score 0 0 0    Fall Risk    05/30/2023    9:03 PM 05/14/2022    3:41 PM 08/28/2020    3:41 PM  Fall Risk   Falls in the past year? 0 0 1  Number falls in past yr: 0 0 1  Injury with Fall? 0 0 0  Risk for fall due to : No Fall Risks Impaired balance/gait;History of fall(s);Impaired mobility;Medication side effect   Follow up Falls prevention discussed;Falls evaluation completed Falls evaluation completed;Education provided;Falls prevention discussed     MEDICARE RISK AT HOME:   TIMED UP AND GO:  Was the test performed?  No     Cognitive Function:        06/02/2023    2:24 PM 05/14/2022    3:46 PM  6CIT Screen  What Year? 0 points 0 points  What month? 0 points 0 points  What time? 0 points 0 points  Count back from 20 0 points 0 points  Months in reverse 0 points 0 points  Repeat phrase 0 points 2 points  Total Score 0 points 2 points    Immunizations Immunization History  Administered Date(s) Administered   PFIZER(Purple Top)SARS-COV-2 Vaccination 08/09/2020, 08/30/2020   Td 09/25/2004   Tdap 06/26/2014    TDAP status: Up to date  Flu Vaccine status: Due, Education has been provided regarding the importance of this vaccine. Advised may receive this vaccine at local pharmacy or Health Dept. Aware to provide a copy of the vaccination record if obtained from local pharmacy or Health Dept. Verbalized acceptance and understanding.  Pneumococcal vaccine status: Due, Education has been provided regarding the importance of this vaccine. Advised may receive this vaccine at local pharmacy or Health Dept. Aware to provide a copy  of the vaccination record if obtained from local pharmacy or Health Dept. Verbalized acceptance and understanding.  Covid-19 vaccine status: Information provided on how to obtain vaccines.   Qualifies for Shingles Vaccine? Yes   Zostavax completed No   Shingrix Completed?: No.    Education has been provided regarding the importance of this vaccine. Patient has been advised to call insurance company to determine out of pocket expense if they have not yet received this vaccine. Advised may also receive vaccine at local pharmacy or Health Dept. Verbalized acceptance and understanding.  Screening Tests Health Maintenance  Topic Date Due   MAMMOGRAM  08/07/2015   COVID-19 Vaccine (3 - Pfizer risk series) 07/27/2023 (Originally 09/27/2020)   Zoster Vaccines- Shingrix (1 of 2) 09/02/2023 (Originally [birth year]1975)   INFLUENZA VACCINE  01/24/2024 (Originally 05/27/2023)   Pneumonia Vaccine 71+  Years old (1 of 1 - PCV) 06/01/2024 (Originally 05/06/2021)   Medicare Annual Wellness (AWV)  06/01/2024   DTaP/Tdap/Td (3 - Td or Tdap) 06/26/2024   Colonoscopy  01/23/2027   DEXA SCAN  Completed   Hepatitis C Screening  Completed   HPV VACCINES  Aged Out    Health Maintenance  Health Maintenance Due  Topic Date Due   MAMMOGRAM  08/07/2015    Colorectal cancer screening: Type of screening: Colonoscopy. Completed 01/22/22. Repeat every 5 years  Mammogram status: Ordered 06/02/23. Pt provided with contact info and advised to call to schedule appt.   Bone Density status: Ordered 06/02/23. Pt provided with contact info and advised to call to schedule appt.  Lung Cancer Screening: (Low Dose CT Chest recommended if Age 57-80 years, 20 pack-year currently smoking OR have quit w/in 15years.) does not qualify.   Lung Cancer Screening Referral: no  Additional Screening:  Hepatitis C Screening: does qualify; Completed 11/27/21  Vision Screening: Recommended annual ophthalmology exams for early detection of glaucoma and other disorders of the eye. Is the patient up to date with their annual eye exam?  Yes  Who is the provider or what is the name of the office in which the patient attends annual eye exams? [facility] If pt is not established with a provider, would they like to be referred to a provider to establish care? Yes .   Dental Screening: Recommended annual dental exams for proper oral hygiene    Community Resource Referral / Chronic Care Management: CRR required this visit?  No   CCM required this visit?  No     Plan:     I have personally reviewed and noted the following in the patient's chart:   Medical and social history Use of alcohol, tobacco or illicit drugs  Current medications and supplements including opioid prescriptions. Patient is not currently taking opioid prescriptions. Functional ability and status Nutritional status Physical activity Advanced  directives List of other physicians Hospitalizations, surgeries, and ER visits in previous 12 months Vitals Screenings to include cognitive, depression, and falls Referrals and appointments  In addition, I have reviewed and discussed with patient certain preventive protocols, quality metrics, and best practice recommendations. A written personalized care plan for preventive services as well as general preventive health recommendations were provided to patient.     Maryan Puls, LPN   [id number]/11/7251   After Visit Summary: (MyChart) Due to this being a telephonic visit, the after visit summary with patients personalized plan was offered to patient via MyChart   Nurse Notes: none

## 2023-06-02 NOTE — Patient Instructions (Signed)
[name] , Thank you for taking time to come for your Medicare Wellness Visit. I appreciate your ongoing commitment to your health goals. Please review the following plan we discussed and let me know if I can assist you in the future.   Referrals/Orders/Follow-Ups/Clinician Recommendations: Aim for 30 minutes of exercise or brisk walking, 6-8 glasses of water, and 5 servings of fruits and vegetables each day.   This is a list of the screening recommended for you and due dates:  Health Maintenance  Topic Date Due   Zoster (Shingles) Vaccine (1 of 2) Never done   Mammogram  08/07/2015   COVID-19 Vaccine (3 - Pfizer risk series) 09/27/2020   Pneumonia Vaccine (1 of 1 - PCV) Never done   Medicare Annual Wellness Visit  05/15/2023   Flu Shot  05/27/2023   DTaP/Tdap/Td vaccine (3 - Td or Tdap) 06/26/2024   Colon Cancer Screening  01/23/2027   DEXA scan (bone density measurement)  Completed   Hepatitis C Screening  Completed   HPV Vaccine  Aged Out    Advanced directives: (In Chart) A copy of your advanced directives are scanned into your chart should your provider ever need it.  Next Medicare Annual Wellness Visit scheduled for next year: Yes  Preventive Care 67 Years and Older, Female Preventive care refers to lifestyle choices and visits with your health care provider that can promote health and wellness. What does preventive care include? A yearly physical exam. This is also called an annual well check. Dental exams once or twice a year. Routine eye exams. Ask your health care provider how often you should have your eyes checked. Personal lifestyle choices, including: Daily care of your teeth and gums. Regular physical activity. Eating a healthy diet. Avoiding tobacco and drug use. Limiting alcohol use. Practicing safe sex. Taking low-dose aspirin every day. Taking vitamin and mineral supplements as recommended by your health care provider. What happens during an annual  well check? The services and screenings done by your health care provider during your annual well check will depend on your age, overall health, lifestyle risk factors, and family history of disease. Counseling  Your health care provider may ask you questions about your: Alcohol use. Tobacco use. Drug use. Emotional well-being. Home and relationship well-being. Sexual activity. Eating habits. History of falls. Memory and ability to understand (cognition). Work and work Astronomer. Reproductive health. Screening  You may have the following tests or measurements: Height, weight, and BMI. Blood pressure. Lipid and cholesterol levels. These may be checked every 5 years, or more frequently if you are over 12 years old. Skin check. Lung cancer screening. You may have this screening every year starting at age 40 if you have a 30-pack-year history of smoking and currently smoke or have quit within the past 15 years. Fecal occult blood test (FOBT) of the stool. You may have this test every year starting at age 60. Flexible sigmoidoscopy or colonoscopy. You may have a sigmoidoscopy every 5 years or a colonoscopy every 10 years starting at age 19. Hepatitis C blood test. Hepatitis B blood test. Sexually transmitted disease (STD) testing. Diabetes screening. This is done by checking your blood sugar (glucose) after you have not eaten for a while (fasting). You may have this done every 1-3 years. Bone density scan. This is done to screen for osteoporosis. You may have this done starting at age 40. Mammogram. This may be done every 1-2 years. Talk to your health care provider about how  often you should have regular mammograms. Talk with your health care provider about your test results, treatment options, and if necessary, the need for more tests. Vaccines  Your health care provider may recommend certain vaccines, such as: Influenza vaccine. This is recommended every year. Tetanus, diphtheria,  and acellular pertussis (Tdap, Td) vaccine. You may need a Td booster every 10 years. Zoster vaccine. You may need this after age 3. Pneumococcal 13-valent conjugate (PCV13) vaccine. One dose is recommended after age 56. Pneumococcal polysaccharide (PPSV23) vaccine. One dose is recommended after age 67. Talk to your health care provider about which screenings and vaccines you need and how often you need them. This information is not intended to replace advice given to you by your health care provider. Make sure you discuss any questions you have with your health care provider. Document Released: 11/08/2015 Document Revised: 07/01/2016 Document Reviewed: 08/13/2015 Elsevier Interactive Patient Education  2017 [employer].  Fall Prevention in the Home Falls can cause injuries. They can happen to people of all ages. There are many things you can do to make your home safe and to help prevent falls. What can I do on the outside of my home? Regularly fix the edges of walkways and driveways and fix any cracks. Remove anything that might make you trip as you walk through a door, such as a raised step or threshold. Trim any bushes or trees on the path to your home. Use bright outdoor lighting. Clear any walking paths of anything that might make someone trip, such as rocks or tools. Regularly check to see if handrails are loose or broken. Make sure that both sides of any steps have handrails. Any raised decks and porches should have guardrails on the edges. Have any leaves, snow, or ice cleared regularly. Use sand or salt on walking paths during winter. Clean up any spills in your garage right away. This includes oil or grease spills. What can I do in the bathroom? Use night lights. Install grab bars by the toilet and in the tub and shower. Do not use towel bars as grab bars. Use non-skid mats or decals in the tub or shower. If you need to sit down in the shower, use a plastic, non-slip  stool. Keep the floor dry. Clean up any water that spills on the floor as soon as it happens. Remove soap buildup in the tub or shower regularly. Attach bath mats securely with double-sided non-slip rug tape. Do not have throw rugs and other things on the floor that can make you trip. What can I do in the bedroom? Use night lights. Make sure that you have a light by your bed that is easy to reach. Do not use any sheets or blankets that are too big for your bed. They should not hang down onto the floor. Have a firm chair that has side arms. You can use this for support while you get dressed. Do not have throw rugs and other things on the floor that can make you trip. What can I do in the kitchen? Clean up any spills right away. Avoid walking on wet floors. Keep items that you use a lot in easy-to-reach places. If you need to reach something above you, use a strong step stool that has a grab bar. Keep electrical cords out of the way. Do not use floor polish or wax that makes floors slippery. If you must use wax, use non-skid floor wax. Do not have throw rugs and other things  on the floor that can make you trip. What can I do with my stairs? Do not leave any items on the stairs. Make sure that there are handrails on both sides of the stairs and use them. Fix handrails that are broken or loose. Make sure that handrails are as long as the stairways. Check any carpeting to make sure that it is firmly attached to the stairs. Fix any carpet that is loose or worn. Avoid having throw rugs at the top or bottom of the stairs. If you do have throw rugs, attach them to the floor with carpet tape. Make sure that you have a light switch at the top of the stairs and the bottom of the stairs. If you do not have them, ask someone to add them for you. What else can I do to help prevent falls? Wear shoes that: Do not have high heels. Have rubber bottoms. Are comfortable and fit you well. Are closed at the  toe. Do not wear sandals. If you use a stepladder: Make sure that it is fully opened. Do not climb a closed stepladder. Make sure that both sides of the stepladder are locked into place. Ask someone to hold it for you, if possible. Clearly mark and make sure that you can see: Any grab bars or handrails. First and last steps. Where the edge of each step is. Use tools that help you move around (mobility aids) if they are needed. These include: Canes. Walkers. Scooters. Crutches. Turn on the lights when you go into a dark area. Replace any light bulbs as soon as they burn out. Set up your furniture so you have a clear path. Avoid moving your furniture around. If any of your floors are uneven, fix them. If there are any pets around you, be aware of where they are. Review your medicines with your doctor. Some medicines can make you feel dizzy. This can increase your chance of falling. Ask your doctor what other things that you can do to help prevent falls. This information is not intended to replace advice given to you by your health care provider. Make sure you discuss any questions you have with your health care provider. Document Released: 08/08/2009 Document Revised: 03/19/2016 Document Reviewed: 11/16/2014 Elsevier Interactive Patient Education  2017 [employer].

## 2023-06-07 ENCOUNTER — Encounter: Payer: Self-pay | Admitting: Family Medicine

## 2023-06-07 NOTE — Telephone Encounter (Signed)
A urinalysis (then culture if needed)   thanks   has had issues with clearing up a uti  Can you answer her question re: how long before she brings ? (I think if she puts it in the fridge it does not matter as much, thanks)

## 2023-06-07 NOTE — Telephone Encounter (Signed)
Will route to PCP for review. Don't see any notes that pt needed f/u urine sample or what type of urine test is needed, pt does have a f/u scheduled on 06/09/23 with PCP, please review

## 2023-06-09 ENCOUNTER — Ambulatory Visit: Payer: Medicare Other | Admitting: Family Medicine

## 2023-06-09 ENCOUNTER — Encounter: Payer: Self-pay | Admitting: Family Medicine

## 2023-06-09 VITALS — BP 138/62 | HR 126 | Temp 98.5°F | Ht 66.0 in | Wt 175.0 lb

## 2023-06-09 DIAGNOSIS — R7303 Prediabetes: Secondary | ICD-10-CM | POA: Diagnosis not present

## 2023-06-09 DIAGNOSIS — N390 Urinary tract infection, site not specified: Secondary | ICD-10-CM

## 2023-06-09 DIAGNOSIS — Z1231 Encounter for screening mammogram for malignant neoplasm of breast: Secondary | ICD-10-CM

## 2023-06-09 DIAGNOSIS — Z79899 Other long term (current) drug therapy: Secondary | ICD-10-CM

## 2023-06-09 DIAGNOSIS — M8580 Other specified disorders of bone density and structure, unspecified site: Secondary | ICD-10-CM

## 2023-06-09 DIAGNOSIS — R000 Tachycardia, unspecified: Secondary | ICD-10-CM

## 2023-06-09 DIAGNOSIS — R748 Abnormal levels of other serum enzymes: Secondary | ICD-10-CM

## 2023-06-09 DIAGNOSIS — K2950 Unspecified chronic gastritis without bleeding: Secondary | ICD-10-CM

## 2023-06-09 DIAGNOSIS — Z1211 Encounter for screening for malignant neoplasm of colon: Secondary | ICD-10-CM

## 2023-06-09 DIAGNOSIS — Z87440 Personal history of urinary (tract) infections: Secondary | ICD-10-CM

## 2023-06-09 DIAGNOSIS — E7800 Pure hypercholesterolemia, unspecified: Secondary | ICD-10-CM | POA: Diagnosis not present

## 2023-06-09 DIAGNOSIS — N3946 Mixed incontinence: Secondary | ICD-10-CM

## 2023-06-09 DIAGNOSIS — E611 Iron deficiency: Secondary | ICD-10-CM

## 2023-06-09 DIAGNOSIS — G9332 Myalgic encephalomyelitis/chronic fatigue syndrome: Secondary | ICD-10-CM | POA: Diagnosis not present

## 2023-06-09 DIAGNOSIS — M419 Scoliosis, unspecified: Secondary | ICD-10-CM

## 2023-06-09 DIAGNOSIS — R1110 Vomiting, unspecified: Secondary | ICD-10-CM

## 2023-06-09 DIAGNOSIS — R110 Nausea: Secondary | ICD-10-CM

## 2023-06-09 LAB — POC URINALSYSI DIPSTICK (AUTOMATED)
Bilirubin, UA: NEGATIVE
Blood, UA: NEGATIVE
Glucose, UA: NEGATIVE
Ketones, UA: NEGATIVE
Leukocytes, UA: NEGATIVE
Nitrite, UA: NEGATIVE
Protein, UA: NEGATIVE
Spec Grav, UA: 1.015 (ref 1.010–1.025)
Urobilinogen, UA: 1 E.U./dL
pH, UA: 7 (ref 5.0–8.0)

## 2023-06-09 MED ORDER — ONDANSETRON HCL 4 MG PO TABS: 4.0000 mg | ORAL_TABLET | Freq: Three times a day (TID) | ORAL | 1 refills | Status: DC | PRN

## 2023-06-09 NOTE — Assessment & Plan Note (Addendum)
Has seen GI  Dx with fatty liver but not cirrhosis  Encouraged low fat diet /weight loss  Bilirubin stays elevated   Chronic nausea

## 2023-06-09 NOTE — Patient Instructions (Addendum)
Discuss uti prevention with Dr Marlou Porch  Discuss protonix with Dr Rhea Belton   Make an appt with Dr Patsy Lager for your back     You have an order for:  []   2D Mammogram  [x]   3D Mammogram  [x]   Bone Density     Please call for appointment:   [x]   [facility]  [address] [place] [address]  [phone]  []   [facility]   [address]  [place], [place] [address]  [phone]  []   [facility]      [address] [place], [place]        [phone]         []   [facility]  [address] [place], [place]  [phone]  []   [facility]   [address]   [place], [place] [address]  [phone]  []  [facility]  [address] [place], [place] [address] [phone]    Make sure to wear two piece clothing  No lotions powders or deodorants the day of the appointment Make sure to bring picture ID and insurance card.  Bring list of medications you are currently taking including any supplements.   Schedule your screening mammogram through MyChart!   Select [place] imaging sites can now be scheduled through MyChart.  Log into your MyChart account.  Go to 'Visit' (or 'Appointments' if  on mobile App) --> Schedule an  Appointment  Under 'Select a Reason for Visit' choose the Mammogram  Screening option.  Complete the pre-visit questions  and select the time and place that  best fits your schedule

## 2023-06-09 NOTE — Assessment & Plan Note (Signed)
Mammogram ordered °Pt will call to schedule  °

## 2023-06-09 NOTE — Assessment & Plan Note (Signed)
Urinalysis is clear today  Continues prophylactic macrobid  Urged her to discuss with urology  Discussed possible increase in risk of PF with long term use

## 2023-06-09 NOTE — Progress Notes (Signed)
Subjective:    Patient ID: [name] Branch, female    DOB: [birth year], 67 y.o.   MRN: [id number]  HPI  Pt presents for annual follow up of chronic medical problems   Wt Readings from Last 3 Encounters:  06/09/23 175 lb (79.4 kg)  06/02/23 175 lb (79.4 kg)  04/26/23 175 lb (79.4 kg)   28.25 kg/m  Vitals:   06/09/23 1421 06/09/23 1506  BP: (!) 140/70 138/62  Pulse: (!) 126   Temp: 98.5 F (36.9 C)   SpO2: 95%     Immunization History  Administered Date(s) Administered   PFIZER(Purple Top)SARS-COV-2 Vaccination 08/09/2020, 08/30/2020   Td 09/25/2004   Tdap 06/26/2014    Health Maintenance Due  Topic Date Due   MAMMOGRAM  08/07/2015   Was seen in ER early July for cempylobacter diarrhea and astrovirus Was treatment with cipro for 3 days  Given zofran   Now nausea  Ate cubumbers last night (tasted funny) from a stand  Feels like she may get diarrhea  No abd pain  No vomiting   Some chronic right shoulder pain   Posture is a problem  Scoliosis and spasm  Leans to the right when sitting  Interested in talking to sport med   Mammogram order is in for [employer] schedule  Was on hold for a while  Self breast exam: no lumps   Gyn health Did have improvement in incontinence after cystocele surgery  Still leaks a lot Uses bedside commode  Sees Dr Marlou Porch  Was told she may need a mesh later      Colon cancer screening -colonoscopy utd 12/2021 with 5 y recall   Bone health  Dexa - ? Last one in 2015 -ostoepenia  Has one ordered for [place]  Falls-not recent/ is high risk  Fractures-none recent  Remote history of compression fracture of L1  Supplements D3 Exercise limited    Mood    06/09/2023    2:40 PM 06/02/2023    2:19 PM 05/14/2022    3:43 PM 08/28/2020    3:41 PM  Depression screen PHQ 2/9  Decreased Interest 0 0 0 0  Down, Depressed, Hopeless 0 0 0 0  PHQ - 2 Score 0 0 0 0  Altered sleeping 0     Tired, decreased energy 1      Change in appetite 1     Feeling bad or failure about yourself  0     Trouble concentrating 0     Moving slowly or fidgety/restless 0     Suicidal thoughts 0     PHQ-9 Score 2      Due for cholesterol and A1c testing   Has frequent utis  Results for orders placed or performed in visit on 06/09/23  POCT Urinalysis Dipstick (Automated)  Result Value Ref Range   Color, UA yellow    Clarity, UA clear    Glucose, UA Negative Negative   Bilirubin, UA neg    Ketones, UA neg    Spec Grav, UA 1.015 1.010 - 1.025   Blood, UA neg    pH, UA 7.0 5.0 - 8.0   Protein, UA Negative Negative   Urobilinogen, UA 1.0 0.2 or 1.0 E.U./dL   Nitrite, UA neg    Leukocytes, UA Negative Negative    Urinalysis is clear today  Thought she had uti Started the prophylactic macrobid again   GERD Protonix 40 mg bid  Wants to cut back to one and will  discuss with her GI   Lab Results  Component Value Date   VITAMINB12 665 11/12/2015   Diet is pretty good  Not much appetite     History of iron def in past  Lab Results  Component Value Date   WBC 3.8 (L) 04/26/2023   HGB 10.2 (L) 04/26/2023   HCT 33.0 (L) 04/26/2023   MCV 79.7 (L) 04/26/2023   PLT 168 04/26/2023  Has had iron infusions    Lab Results  Component Value Date   NA 135 04/26/2023   K 3.5 04/26/2023   CO2 19 (L) 04/26/2023   GLUCOSE 116 (H) 04/26/2023   BUN 8 04/26/2023   CREATININE 0.49 04/26/2023   CALCIUM 8.9 04/26/2023   GFR 94.93 08/28/2020   EGFR 101 09/01/2022   GFRNONAA >60 04/26/2023   Lab Results  Component Value Date   ALT 20 04/26/2023   AST 47 (H) 04/26/2023   ALKPHOS 88 04/26/2023   BILITOT 3.0 (H) 04/26/2023   GI is watching liver labs  Thinks not eating and vomiting may have affected blood work     Patient Active Problem List   Diagnosis Date Noted   Current use of proton pump inhibitor 06/09/2023   Scoliosis 06/09/2023   Iron deficiency 06/09/2023   Nausea 06/09/2023   Fatty liver  09/09/2022   IDA (iron deficiency anemia) 09/04/2022   Pre-operative cardiovascular examination 09/01/2022   Gastritis 02/10/2022   Female bladder prolapse 02/10/2022   Encounter for screening mammogram for breast cancer 02/10/2022   Other chronic pain    Elevated liver enzymes 08/28/2020   Compression fracture of L1 lumbar vertebra (HCC) 08/28/2020   Mixed stress and urge urinary incontinence 10/29/2017   Frequent UTI 02/18/2016   Tremor 11/12/2015   Constipation 04/12/2015   Cystocele, unspecified 04/12/2015   Routine general medical examination at a health care facility 06/26/2014   Osteopenia 06/26/2014   Prediabetes 06/26/2014   Colon cancer screening 06/26/2014   Intertrigo 07/12/2013   Chronic fatigue syndrome 01/19/2013   Elevated alkaline phosphatase level 09/19/2012   Edema 09/16/2012   Chronic vomiting 08/05/2011   Tachycardia 08/05/2011   CONSTIPATION, CHRONIC 07/30/2008   Irritable bowel syndrome 07/30/2008   Pure hypercholesterolemia 06/21/2008   POSTMENOPAUSAL STATUS 06/21/2008   VAGINITIS, ATROPHIC 06/21/2008   Past Medical History:  Diagnosis Date   Allergic rhinitis    Anemia    Arthritis    Chronic fatigue syndrome    CFIDs; also chronic joint and mucle pain (possible tik bourne illness?). Dr. Neldon Mc    Chronic gastritis    Dyspnea    GERD (gastroesophageal reflux disease)    Hepatic steatosis    History of kidney stones 05/2017   Hyperlipidemia    IBS (irritable bowel syndrome)    Lyme disease    "stari"   Migraine    Muscle twitching    Neuromuscular disorder (HCC)    Pneumonia    PONV (postoperative nausea and vomiting)    Pre-diabetes    Rectal prolapse    Schatzki's ring    Umbilical hernia    Vaginal prolapse    West Nile Virus infection 2003   Past Surgical History:  Procedure Laterality Date   5th R toe fx  '09   ABDOMINAL HYSTERECTOMY     BREAST BIOPSY  '04   neg; right   CHOLECYSTECTOMY  10/2011   COLONOSCOPY  12/02/2021    2017- 2- TAs   CYSTOSCOPY WITH STENT PLACEMENT Right 05/25/2017   Procedure:  CYSTOSCOPY WITH STENT PLACEMENT;  Surgeon: Vanna Scotland, MD;  Location: [facility];  Service: Urology;  Laterality: Right;   CYSTOSCOPY WITH STENT PLACEMENT Right 06/21/2017   Procedure: CYSTOSCOPY WITH STENT EXCHANGE;  Surgeon: Vanna Scotland, MD;  Location: [facility];  Service: Urology;  Laterality: Right;   DIAGNOSTIC LAPAROSCOPY     EXTRACORPOREAL SHOCK WAVE LITHOTRIPSY Right 06/10/2017   Procedure: EXTRACORPOREAL SHOCK WAVE LITHOTRIPSY (ESWL);  Surgeon: Vanna Scotland, MD;  Location: [facility];  Service: Urology;  Laterality: Right;   HOLMIUM LASER APPLICATION Right 06/21/2017   Procedure: HOLMIUM LASER APPLICATION;  Surgeon: Vanna Scotland, MD;  Location: [facility];  Service: Urology;  Laterality: Right;   laprascopy     PARTIAL HYSTERECTOMY     bleeding   ROBOTIC ASSISTED LAPAROSCOPIC SACROCOLPOPEXY N/A 09/25/2022   Procedure: XI ROBOTIC ASSISTED LAPAROSCOPIC SACROCOLPOPEXY; BILATERAL SALPINGOOPHERECTOMY;  Surgeon: Crist Fat, MD;  Location: [facility];  Service: Urology;  Laterality: N/A;  240 MINUTES NEEDED FOR CASE   TONSILLECTOMY     TONSILLECTOMY     ULNAR NERVE REPAIR     sx times 5   UPPER GASTROINTESTINAL ENDOSCOPY  12/04/2004   gastritis   URETEROSCOPY WITH HOLMIUM LASER LITHOTRIPSY Right 06/21/2017   Procedure: URETEROSCOPY WITH HOLMIUM LASER LITHOTRIPSY;  Surgeon: Vanna Scotland, MD;  Location: [facility];  Service: Urology;  Laterality: Right;   Social History   Tobacco Use   Smoking status: Never   Smokeless tobacco: Never   Tobacco comments:    non smoker  Vaping Use   Vaping status: Never Used  Substance Use Topics   Alcohol use: No    Alcohol/week: 0.0 standard drinks of alcohol   Drug use: Never   Family History  Problem Relation Age of Onset   Hypertension Mother    Cancer Mother        Waldenstroms   Ulcers Mother    Diabetes Father    Hypertension Father    Stroke  Father    Cancer Father    Throat cancer Father    Bladder Cancer Father    Breast cancer Sister    Alcohol abuse Maternal Grandfather    Breast cancer Paternal Grandmother    Lung cancer Paternal Grandfather    Bipolar disorder Other        some family   Colon cancer Neg Hx    Esophageal cancer Neg Hx    Pancreatic cancer Neg Hx    Ulcerative colitis Neg Hx    Allergies  Allergen Reactions   Other Nausea And Vomiting    All Antibiotics   Sulfonamide Derivatives Nausea And Vomiting   Current Outpatient Medications on File Prior to Visit  Medication Sig Dispense Refill   Cholecalciferol (VITAMIN D) 125 MCG (5000 UT) CAPS Take 5,000 Units by mouth daily.     docusate sodium (COLACE) 100 MG capsule Take 1 capsule (100 mg total) by mouth 2 (two) times daily. 60 capsule 0   HM MAGNESIUM CITRATE PO Take 2 tablets by mouth daily.     ketoconazole (NIZORAL) 2 % cream APPLY 1 APPLICATION. TOPICALLY DAILY. TO AFFECTED AREA 30 g 1   Multiple Vitamin (MULTIVITAMIN) tablet Take 1 tablet by mouth daily.     Multiple Vitamins-Minerals (HAIR SKIN AND NAILS FORMULA PO) Take 1 tablet by mouth daily.     nitrofurantoin, macrocrystal-monohydrate, (MACROBID) 100 MG capsule TAKE 1 CAPSULE BY MOUTH EVERY DAY 30 capsule 5   pantoprazole (PROTONIX) 40 MG tablet TAKE 1 TABLET (40 MG TOTAL)  BY MOUTH TWICE A DAY BEFORE MEALS 60 tablet 11   polyethylene glycol (MIRALAX / GLYCOLAX) 17 g packet Take 17 g by mouth daily.     promethazine (PHENERGAN) 25 MG tablet TAKE 1 TABLET BY MOUTH EVERY 8 HOURS AS NEEDED FOR NAUSEA OR VOMITING. 45 tablet 0   vitamin B-12 (CYANOCOBALAMIN) 1000 MCG tablet Place 1,000 mcg under the tongue daily.     vitamin E 180 MG (400 UNITS) capsule Take 2 capsules (800 Units total) by mouth daily. 180 capsule 3   No current facility-administered medications on file prior to visit.    Review of Systems  Constitutional:  Positive for fatigue. Negative for activity change, appetite change,  fever and unexpected weight change.  HENT:  Negative for congestion, ear pain, rhinorrhea, sinus pressure and sore throat.   Eyes:  Negative for pain, redness and visual disturbance.  Respiratory:  Negative for cough, shortness of breath and wheezing.   Cardiovascular:  Negative for chest pain and palpitations.  Gastrointestinal:  Positive for constipation and nausea. Negative for abdominal pain, blood in stool, diarrhea and vomiting.  Endocrine: Negative for polydipsia and polyuria.  Genitourinary:  Positive for urgency. Negative for dysuria and frequency.       Incontinence   Musculoskeletal:  Positive for arthralgias, back pain and myalgias.  Skin:  Negative for pallor and rash.       Dry skin  Wonders about psoriasis  Allergic/Immunologic: Negative for environmental allergies.  Neurological:  Negative for dizziness, syncope and headaches.  Hematological:  Negative for adenopathy. Does not bruise/bleed easily.  Psychiatric/Behavioral:  Negative for decreased concentration and dysphoric mood. The patient is nervous/anxious.        Objective:   Physical Exam Constitutional:      General: She is not in acute distress.    Appearance: Normal appearance. She is well-developed. She is not ill-appearing or diaphoretic.     Comments: Overweight  In wheelchair   HENT:     Head: Normocephalic and atraumatic.     Right Ear: Tympanic membrane, ear canal and external ear normal.     Left Ear: Tympanic membrane, ear canal and external ear normal.     Nose: Nose normal. No congestion.     Mouth/Throat:     Mouth: Mucous membranes are moist.     Pharynx: Oropharynx is clear. No posterior oropharyngeal erythema.  Eyes:     General: No scleral icterus.    Extraocular Movements: Extraocular movements intact.     Conjunctiva/sclera: Conjunctivae normal.     Pupils: Pupils are equal, round, and reactive to light.  Neck:     Thyroid: No thyromegaly.     Vascular: No carotid bruit or JVD.   Cardiovascular:     Rate and Rhythm: Normal rate and regular rhythm.     Pulses: Normal pulses.     Heart sounds: Normal heart sounds.     No gallop.  Pulmonary:     Effort: Pulmonary effort is normal. No respiratory distress.     Breath sounds: Normal breath sounds. No wheezing.     Comments: Good air exch Chest:     Chest wall: No tenderness.  Abdominal:     General: Bowel sounds are normal. There is no distension or abdominal bruit.     Palpations: Abdomen is soft. There is no mass.     Tenderness: There is no abdominal tenderness. There is no guarding or rebound.     Hernia: No hernia is present.  Genitourinary:  Comments: Breast exam: No mass, nodules, thickening, tenderness, bulging, retraction, inflamation, nipple discharge or skin changes noted.  No axillary or clavicular LA.     Musculoskeletal:        General: No tenderness. Normal range of motion.     Cervical back: Normal range of motion and neck supple. No rigidity. No muscular tenderness.     Right lower leg: No edema.     Left lower leg: No edema.     Comments: Scoliosis with some kyphosis  Mostly thoracic  Tight neck musculature / has trouble lifting head due to pain   Limited abd of right shoulder   Lymphadenopathy:     Cervical: No cervical adenopathy.  Skin:    General: Skin is warm and dry.     Coloration: Skin is not jaundiced or pale.     Findings: No bruising, erythema or rash.     Comments: Solar lentigines diffusely Scattered sks   Neurological:     Mental Status: She is alert. Mental status is at baseline.     Cranial Nerves: No cranial nerve deficit.     Motor: No abnormal muscle tone.     Coordination: Coordination normal.     Gait: Gait normal.     Deep Tendon Reflexes: Reflexes are normal and symmetric. Reflexes normal.  Psychiatric:        Mood and Affect: Mood normal.        Cognition and Memory: Cognition and memory normal.           Assessment & Plan:   Problem List Items  Addressed This Visit       Digestive   Gastritis    With chronic nausea  Taking protonix bid  Pt would like to try and wean this  Plans to discuss with GI       Chronic vomiting    Ongoing  Some increase in nausea today /has not vomited         Nervous and Auditory   Chronic fatigue syndrome    This worsens with illness and stress  Continue to monitor labs       Relevant Orders   TSH   Comprehensive metabolic panel   CBC with Differential/Platelet     Musculoskeletal and Integument   Scoliosis    Causing lean to right and neck stiffness -worsening chronic right shoulder pain  Interested in sports med eval and possibly PT  Will refer       Osteopenia    Due for dexa- ordered and given # to schedule  High fall risk No fractures  Very limited in exercise due to chronic pain          Genitourinary   Frequent UTI    Urinalysis is clear today  Continues prophylactic macrobid  Urged her to discuss with urology  Discussed possible increase in risk of PF with long term use         Other   Tachycardia    HR is higher here than at home  Visits here may cause anxiety but also pain and nausea  Regular rhythm  Continue to monitor at home       Pure hypercholesterolemia    Disc goals for lipids and reasons to control them Rev last labs with pt Rev low sat fat diet in detail  Labs today  Exercise is limited        Relevant Orders   Lipid panel   Comprehensive metabolic panel   Prediabetes  A1c today  disc imp of low glycemic diet and wt loss to prevent DM2       Relevant Orders   Hemoglobin A1c   Nausea    Intermittent Worse today  Still unsure of cause  Urinalysis clear Zofran refilled       Mixed stress and urge urinary incontinence    Some mild improvement with cytocele repair  Still symptomatic       Iron deficiency    Has had iron infusions  Followed by hematology  Cbc today  Hb did go down after uro /gyn surgery        Encounter for screening mammogram for breast cancer    Mammogram ordered  Pt will call to schedule      Elevated liver enzymes    Has seen GI  Dx with fatty liver but not cirrhosis  Encouraged low fat diet /weight loss  Bilirubin stays elevated   Chronic nausea       Current use of proton pump inhibitor    B12 added to labs  Medicare would not approve D level but pt is compliant with D3 supplementation       Relevant Orders   Vitamin B12   Colon cancer screening    Colonoscopy 96295 with 5 y recall      Other Visit Diagnoses     History of UTI    -  Primary   Relevant Orders   POCT Urinalysis Dipstick (Automated) (Completed)

## 2023-06-09 NOTE — Assessment & Plan Note (Signed)
HR is higher here than at home  Visits here may cause anxiety but also pain and nausea  Regular rhythm  Continue to monitor at home

## 2023-06-09 NOTE — Assessment & Plan Note (Signed)
A1c today disc imp of low glycemic diet and wt loss to prevent DM2  

## 2023-06-09 NOTE — Assessment & Plan Note (Signed)
Colonoscopy 38756 with 5 y recall

## 2023-06-09 NOTE — Assessment & Plan Note (Signed)
This worsens with illness and stress  Continue to monitor labs

## 2023-06-09 NOTE — Assessment & Plan Note (Signed)
Causing lean to right and neck stiffness -worsening chronic right shoulder pain  Interested in sports med eval and possibly PT  Will refer

## 2023-06-09 NOTE — Assessment & Plan Note (Signed)
Intermittent Worse today  Still unsure of cause  Urinalysis clear Zofran refilled

## 2023-06-09 NOTE — Assessment & Plan Note (Signed)
Due for dexa- ordered and given # to schedule  High fall risk No fractures  Very limited in exercise due to chronic pain

## 2023-06-09 NOTE — Assessment & Plan Note (Signed)
Some mild improvement with cytocele repair  Still symptomatic

## 2023-06-09 NOTE — Assessment & Plan Note (Signed)
Has had iron infusions  Followed by hematology  Cbc today  Hb did go down after uro /gyn surgery

## 2023-06-09 NOTE — Assessment & Plan Note (Signed)
B12 added to labs  Medicare would not approve D level but pt is compliant with D3 supplementation

## 2023-06-09 NOTE — Assessment & Plan Note (Signed)
Disc goals for lipids and reasons to control them Rev last labs with pt Rev low sat fat diet in detail  Labs today  Exercise is limited

## 2023-06-09 NOTE — Assessment & Plan Note (Signed)
With chronic nausea  Taking protonix bid  Pt would like to try and wean this  Plans to discuss with GI

## 2023-06-09 NOTE — Assessment & Plan Note (Signed)
Ongoing  Some increase in nausea today /has not vomited

## 2023-06-10 ENCOUNTER — Encounter (INDEPENDENT_AMBULATORY_CARE_PROVIDER_SITE_OTHER): Payer: Self-pay

## 2023-06-10 LAB — CBC WITH DIFFERENTIAL/PLATELET
Basophils Absolute: 0 10*3/uL (ref 0.0–0.1)
Basophils Relative: 1.2 % (ref 0.0–3.0)
Eosinophils Absolute: 0.2 10*3/uL (ref 0.0–0.7)
Eosinophils Relative: 4.2 % (ref 0.0–5.0)
HCT: 32.3 % — ABNORMAL LOW (ref 36.0–46.0)
Hemoglobin: 10.2 g/dL — ABNORMAL LOW (ref 12.0–15.0)
Lymphocytes Relative: 37.8 % (ref 12.0–46.0)
Lymphs Abs: 1.5 10*3/uL (ref 0.7–4.0)
MCHC: 31.7 g/dL (ref 30.0–36.0)
MCV: 75.5 fl — ABNORMAL LOW (ref 78.0–100.0)
Monocytes Absolute: 0.7 10*3/uL (ref 0.1–1.0)
Monocytes Relative: 17.1 % — ABNORMAL HIGH (ref 3.0–12.0)
Neutro Abs: 1.6 10*3/uL (ref 1.4–7.7)
Neutrophils Relative %: 39.7 % — ABNORMAL LOW (ref 43.0–77.0)
Platelets: 203 10*3/uL (ref 150.0–400.0)
RBC: 4.28 Mil/uL (ref 3.87–5.11)
RDW: 18 % — ABNORMAL HIGH (ref 11.5–15.5)
WBC: 4 10*3/uL (ref 4.0–10.5)

## 2023-06-10 LAB — LIPID PANEL
Cholesterol: 151 mg/dL (ref 0–200)
HDL: 47.2 mg/dL (ref >39.00)
LDL Cholesterol: 90 mg/dL (ref 0–99)
NonHDL: 104.21
Total CHOL/HDL Ratio: 3
Triglycerides: 70 mg/dL (ref 0.0–149.0)
VLDL: 14 mg/dL (ref 0.0–40.0)

## 2023-06-10 LAB — COMPREHENSIVE METABOLIC PANEL
ALT: 20 U/L (ref 0–35)
AST: 40 U/L — ABNORMAL HIGH (ref 0–37)
Albumin: 3.6 g/dL (ref 3.5–5.2)
Alkaline Phosphatase: 101 U/L (ref 39–117)
BUN: 11 mg/dL (ref 6–23)
CO2: 24 meq/L (ref 19–32)
Calcium: 9.2 mg/dL (ref 8.4–10.5)
Chloride: 103 meq/L (ref 96–112)
Creatinine, Ser: 0.51 mg/dL (ref 0.40–1.20)
GFR: 96.81 mL/min (ref >60.00)
Glucose, Bld: 102 mg/dL — ABNORMAL HIGH (ref 70–99)
Potassium: 3.6 meq/L (ref 3.5–5.1)
Sodium: 137 meq/L (ref 135–145)
Total Bilirubin: 1.5 mg/dL — ABNORMAL HIGH (ref 0.2–1.2)
Total Protein: 6.5 g/dL (ref 6.0–8.3)

## 2023-06-10 LAB — VITAMIN B12: Vitamin B-12: 711 pg/mL (ref 211–911)

## 2023-06-10 LAB — HEMOGLOBIN A1C: Hgb A1c MFr Bld: 5.4 % (ref 4.6–6.5)

## 2023-06-10 LAB — TSH: TSH: 1.8 u[IU]/mL (ref 0.35–5.50)

## 2023-06-14 ENCOUNTER — Telehealth: Payer: Self-pay

## 2023-06-14 DIAGNOSIS — D500 Iron deficiency anemia secondary to blood loss (chronic): Secondary | ICD-10-CM

## 2023-06-14 NOTE — Telephone Encounter (Signed)
-----   Message from Rickard Patience sent at 06/12/2023  1:19 PM EDT ----- Dr. Milinda Antis,  Thanks for the update.  She has no showed/cancelled appt in Feb 2024.  We can offer her a follow up appt.   Lanora Manis and Ivin Booty, please arrange her to do labs prior to MD +/- Venofer, check iron tibc ferritin Thanks  Zhou ----- Message ----- From: Shon Millet, CMA Sent: 06/11/2023  12:38 PM EDT To: Rickard Patience, MD  Pt viewed results and Dr. Royden Purl comments via mychart and labs cc to oncology

## 2023-06-15 ENCOUNTER — Encounter: Payer: Self-pay | Admitting: Oncology

## 2023-06-30 ENCOUNTER — Ambulatory Visit: Payer: Medicare Other | Admitting: Family Medicine

## 2023-07-05 ENCOUNTER — Encounter: Payer: Self-pay | Admitting: Family Medicine

## 2023-07-07 ENCOUNTER — Ambulatory Visit: Payer: Medicare Other | Admitting: Family Medicine

## 2023-07-07 LAB — MISCELLANEOUS TEST

## 2023-07-21 ENCOUNTER — Ambulatory Visit
Admission: RE | Admit: 2023-07-21 | Discharge: 2023-07-21 | Disposition: A | Discharge status: Home / Self Care | Payer: Medicare Other | Source: Ambulatory Visit | Attending: Family Medicine | Admitting: Family Medicine

## 2023-07-21 ENCOUNTER — Other Ambulatory Visit: Payer: Self-pay | Admitting: Family Medicine

## 2023-07-21 DIAGNOSIS — Z780 Asymptomatic menopausal state: Secondary | ICD-10-CM

## 2023-07-21 DIAGNOSIS — Z1231 Encounter for screening mammogram for malignant neoplasm of breast: Secondary | ICD-10-CM | POA: Diagnosis present

## 2023-07-21 DIAGNOSIS — Z0000 Encounter for general adult medical examination without abnormal findings: Secondary | ICD-10-CM

## 2023-07-23 ENCOUNTER — Other Ambulatory Visit: Payer: Self-pay | Admitting: Family Medicine

## 2023-07-23 DIAGNOSIS — R928 Other abnormal and inconclusive findings on diagnostic imaging of breast: Secondary | ICD-10-CM

## 2023-08-04 ENCOUNTER — Encounter: Payer: Self-pay | Admitting: Family Medicine

## 2023-08-18 ENCOUNTER — Ambulatory Visit: Payer: Medicare Other | Admitting: Family Medicine

## 2023-08-24 ENCOUNTER — Ambulatory Visit
Admission: RE | Admit: 2023-08-24 | Discharge: 2023-08-24 | Disposition: A | Discharge status: Home / Self Care | Payer: Medicare Other | Source: Ambulatory Visit | Attending: Family Medicine | Admitting: Family Medicine

## 2023-08-24 DIAGNOSIS — R928 Other abnormal and inconclusive findings on diagnostic imaging of breast: Secondary | ICD-10-CM

## 2023-08-26 ENCOUNTER — Encounter: Payer: Self-pay | Admitting: Family Medicine

## 2023-08-26 NOTE — Telephone Encounter (Signed)
I am leaving soon for the day and will be in the office 1/2 day tomorrow - if she brings sample in am(before 11) , that is fine with me  Or she could drop off at urology if they are open to that

## 2023-08-27 ENCOUNTER — Telehealth: Payer: Self-pay | Admitting: *Deleted

## 2023-08-27 ENCOUNTER — Other Ambulatory Visit: Payer: Medicare Other

## 2023-08-27 ENCOUNTER — Telehealth (INDEPENDENT_AMBULATORY_CARE_PROVIDER_SITE_OTHER): Payer: Medicare Other | Admitting: Family Medicine

## 2023-08-27 DIAGNOSIS — N390 Urinary tract infection, site not specified: Secondary | ICD-10-CM

## 2023-08-27 LAB — POC URINALSYSI DIPSTICK (AUTOMATED)
Bilirubin, UA: NEGATIVE
Blood, UA: NEGATIVE
Color, UA: NEGATIVE
Glucose, UA: NEGATIVE
Ketones, UA: NEGATIVE
Leukocytes, UA: NEGATIVE
Nitrite, UA: POSITIVE
Protein, UA: NEGATIVE
Spec Grav, UA: 1.015 (ref 1.010–1.025)
Urobilinogen, UA: NEGATIVE U/dL — AB
pH, UA: 6.5 (ref 5.0–8.0)

## 2023-08-27 MED ORDER — ONDANSETRON HCL 4 MG PO TABS: 4.0000 mg | ORAL_TABLET | Freq: Two times a day (BID) | ORAL | 0 refills | Status: DC | PRN

## 2023-08-27 MED ORDER — NITROFURANTOIN MONOHYD MACRO 100 MG PO CAPS: 100.0000 mg | ORAL_CAPSULE | Freq: Two times a day (BID) | ORAL | 0 refills | Status: DC

## 2023-08-27 NOTE — Telephone Encounter (Signed)
-----   Message from [facility] sent at 08/27/2023  2:12 PM EDT ----- Urine is fairly normal but there were nitrites and cloudy appearance (can cause urine odor change) Please send for culture (ordered)  Please send  in macrobid 100 mg bid for 5 d #10 no refills to her pharmacy of choice  She can either start it or wait for culture result  Please give ER precautions  I am going out of town to return Monday Will contact with culture result when it returns

## 2023-08-27 NOTE — Telephone Encounter (Signed)
Urinary symptoms Frequent uti   Urinalysis and culture

## 2023-08-27 NOTE — Telephone Encounter (Signed)
Pt notified of Dr. Royden Purl comments and verbalized understanding. Rx sent to pharmacy

## 2023-08-29 LAB — URINE CULTURE
MICRO NUMBER:: 15675426
SPECIMEN QUALITY:: ADEQUATE

## 2023-08-30 MED ORDER — CEPHALEXIN 500 MG PO CAPS: 500.0000 mg | ORAL_CAPSULE | Freq: Two times a day (BID) | ORAL | 0 refills | Status: DC

## 2023-08-30 NOTE — Progress Notes (Signed)
Uti is resistant to macrobid Stop it  Start keflex 500 mg -sent to [facility] in [place]  Alert [place] if not improving   Follow up with urology as planned

## 2023-08-30 NOTE — Addendum Note (Signed)
Addended by: Roxy Manns A on: 08/30/2023 12:30 PM   Modules accepted: Orders

## 2023-08-30 NOTE — Telephone Encounter (Signed)
POC UA completed on Friday and Urine culture prepared for lab.  Did not see message until now.

## 2023-09-13 ENCOUNTER — Ambulatory Visit: Payer: Medicare Other | Admitting: Family Medicine

## 2023-09-15 ENCOUNTER — Ambulatory Visit: Payer: Medicare Other | Admitting: Family Medicine

## 2023-09-21 ENCOUNTER — Other Ambulatory Visit: Payer: Self-pay | Admitting: Family Medicine

## 2023-09-21 ENCOUNTER — Encounter: Payer: Self-pay | Admitting: Family Medicine

## 2023-09-21 MED ORDER — ONDANSETRON HCL 4 MG PO TABS: 4.0000 mg | ORAL_TABLET | Freq: Two times a day (BID) | ORAL | 0 refills | Status: DC | PRN

## 2023-09-21 NOTE — Telephone Encounter (Signed)
See my chart message for patient request of refill

## 2023-09-21 NOTE — Telephone Encounter (Signed)
We can schedule a virtual - if she absolutely cannot do that and she knows that a phone visit is covered/ I can do a phone visit this one time I will watch for refill request for zofran Thanks

## 2023-09-22 NOTE — Telephone Encounter (Signed)
LVM for patient to c/b

## 2023-09-28 ENCOUNTER — Encounter: Payer: Self-pay | Admitting: Family Medicine

## 2023-09-28 NOTE — Telephone Encounter (Signed)
LVM for patient to c/b

## 2023-10-01 ENCOUNTER — Telehealth: Payer: Self-pay | Admitting: Gastroenterology

## 2023-10-01 NOTE — Telephone Encounter (Signed)
Line rings no answer no voice mail  °

## 2023-10-01 NOTE — Telephone Encounter (Signed)
Requesting call back to discuss symptoms. She has nausea, vomiting, and severe constipation. Please advise.

## 2023-10-04 ENCOUNTER — Other Ambulatory Visit: Payer: Self-pay | Admitting: Family Medicine

## 2023-10-04 ENCOUNTER — Encounter: Payer: Self-pay | Admitting: Family Medicine

## 2023-10-04 NOTE — Telephone Encounter (Signed)
The pt has nausea, vomiting, and constipation for the past several days.  She was last seen 08/2022 for IDA. She is a Dr [name] Branch pt.  I have set her up to see Jill Side in a 7 day hold on 10/14/23 at 130 pm.  Offered other days but pt declined.  She is taking protonix 40 mg BID.  She states she has has this happen in the past when she had ulcers.  She has no bleeding or pain. Her stools are not dark.  She will call back if her symptoms worsen prior to the appt.

## 2023-10-04 NOTE — Telephone Encounter (Signed)
PT returning call. Please advise. [phone]

## 2023-10-04 NOTE — Telephone Encounter (Signed)
Left message on machine to call back  

## 2023-10-04 NOTE — Telephone Encounter (Signed)
Error 10/14/23 at 130 pm with Willette Cluster NP.

## 2023-10-05 MED ORDER — PROMETHAZINE HCL 25 MG PO TABS: 25.0000 mg | ORAL_TABLET | Freq: Three times a day (TID) | ORAL | 0 refills | Status: DC | PRN

## 2023-10-05 NOTE — Telephone Encounter (Signed)
Last filled on 10/28/22 #45 tabs/ 0 refills  Last OV was PCP was on 06/09/23 Next appt is with Dr. Patsy Lager on 10/13/23

## 2023-10-07 ENCOUNTER — Ambulatory Visit: Payer: Medicare Other | Admitting: Family Medicine

## 2023-10-08 NOTE — Telephone Encounter (Signed)
Called patient in reference to the complaints of constipation per Ms. Cristi Loron, PA. Informed the patient to take Mirralax purge which includes 7 capfuls of Mirralax and mix with 32 ozs of Gatorade and drink 1 cup every 15 minutes. Patient understood and agreed.

## 2023-10-13 ENCOUNTER — Ambulatory Visit: Payer: Medicare Other | Admitting: Family Medicine

## 2023-10-14 ENCOUNTER — Ambulatory Visit: Payer: Medicare Other | Admitting: Nurse Practitioner

## 2023-10-23 ENCOUNTER — Other Ambulatory Visit: Payer: Self-pay | Admitting: Internal Medicine

## 2023-11-03 ENCOUNTER — Ambulatory Visit: Payer: Medicare Other | Admitting: Family Medicine

## 2023-11-09 NOTE — Progress Notes (Deleted)
   [name] Branch T. Hannah Crill, MD, CAQ Sports Medicine [facility] [address] [place], [address]  Phone: [phone]  FAX: [phone]  [name] Branch - 68 y.o. female  MRN [id number]  Date of Birth: May 19, 1956  Date: 11/10/2023  PCP: Clemens Curt, MD  Referral: Clemens Curt, MD  No chief complaint on file.  Subjective:   [name] Branch is a 68 y.o. very pleasant female patient with There is no height or weight on file to calculate BMI. who presents with the following:  She is a very pleasant lady who is a new patient for me, and she presents with some ongoing right-sided shoulder pain.  She also has some questions regarding back and scoliosis.    Review of Systems is noted in the HPI, as appropriate  Objective:   There were no vitals taken for this visit.  GEN: No acute distress; alert,appropriate. PULM: Breathing comfortably in no respiratory distress PSYCH: Normally interactive.   Laboratory and Imaging Data:  Assessment and Plan:   ***

## 2023-11-10 ENCOUNTER — Ambulatory Visit: Payer: Medicare Other | Admitting: Family Medicine

## 2023-11-10 VITALS — BP 145/77 | HR 125 | Temp 99.3°F | Ht 66.0 in

## 2023-11-10 DIAGNOSIS — R030 Elevated blood-pressure reading, without diagnosis of hypertension: Secondary | ICD-10-CM | POA: Diagnosis not present

## 2023-11-10 DIAGNOSIS — J029 Acute pharyngitis, unspecified: Secondary | ICD-10-CM

## 2023-11-10 DIAGNOSIS — J020 Streptococcal pharyngitis: Secondary | ICD-10-CM | POA: Insufficient documentation

## 2023-11-10 DIAGNOSIS — B370 Candidal stomatitis: Secondary | ICD-10-CM | POA: Diagnosis not present

## 2023-11-10 DIAGNOSIS — R1110 Vomiting, unspecified: Secondary | ICD-10-CM

## 2023-11-10 DIAGNOSIS — M25511 Pain in right shoulder: Secondary | ICD-10-CM

## 2023-11-10 LAB — POCT RAPID STREP A (OFFICE): Rapid Strep A Screen: POSITIVE — AB

## 2023-11-10 MED ORDER — AMOXICILLIN 250 MG/5ML PO SUSR: 500.0000 mg | Freq: Three times a day (TID) | ORAL | 0 refills | Status: AC

## 2023-11-10 MED ORDER — NYSTATIN 100000 UNIT/ML MT SUSP: 5.0000 mL | Freq: Three times a day (TID) | OROMUCOSAL | 1 refills | Status: DC

## 2023-11-10 MED ORDER — ONDANSETRON 8 MG PO TBDP: 8.0000 mg | ORAL_TABLET | Freq: Three times a day (TID) | ORAL | 0 refills | Status: DC | PRN

## 2023-11-10 NOTE — Progress Notes (Signed)
 Subjective:    Patient ID: [name] Branch, female    DOB: 01/17/1956, 68 y.o.   MRN: [id number]  HPI  Wt Readings from Last 3 Encounters:  06/09/23 175 lb (79.4 kg)  06/02/23 175 lb (79.4 kg)  04/26/23 175 lb (79.4 kg)   28.25 kg/m  Vitals:   11/10/23 1424 11/10/23 1505  BP: (!) 144/40 (!) 145/77  Pulse: (!) 125   Temp: 99.3 F (37.4 C)   SpO2: 95%     Pt presents with ST and elevated temp   Strep test is positive today   ST in late November or early December  Finally starting to feel a little beter  Thought if may be thrush (started at end of keflex )   Worse in 3-4 weeks   Some fever  Highest is 99.9  Some headache    Does have chronic n/v  Bothers her throat as well  Wonders if her ulcers are acting up again  Tried calling GI  [place] if she can eat anything  Vomits 0-2 times per day  Sometimes nausea without vomiting     Zofran  - has run out of -had 4 mg but 8 works better in past  Phenergan - almost gone    Results for orders placed or performed in visit on 11/10/23  Rapid Strep A   Collection Time: 11/10/23  2:40 PM  Result Value Ref Range   Rapid Strep A Screen Positive (A) Negative     Patient Active Problem List   Diagnosis Date Noted   Strep pharyngitis 11/10/2023   Thrush 11/10/2023   Elevated BP without diagnosis of hypertension 11/10/2023   Current use of proton pump inhibitor 06/09/2023   Scoliosis 06/09/2023   Iron  deficiency 06/09/2023   Nausea 06/09/2023   Fatty liver 09/09/2022   IDA (iron  deficiency anemia) 09/04/2022   Pre-operative cardiovascular examination 09/01/2022   Gastritis 02/10/2022   Female bladder prolapse 02/10/2022   Encounter for screening mammogram for breast cancer 02/10/2022   Other chronic pain    Elevated liver enzymes 08/28/2020   Compression fracture of L1 lumbar vertebra (HCC) 08/28/2020   Mixed stress and urge urinary incontinence 10/29/2017   Frequent UTI 02/18/2016   Tremor 11/12/2015    Constipation 04/12/2015   Cystocele, unspecified 04/12/2015   Routine general medical examination at a health care facility 06/26/2014   Osteopenia 06/26/2014   Prediabetes 06/26/2014   Colon cancer screening 06/26/2014   Intertrigo 07/12/2013   Chronic fatigue syndrome 01/19/2013   Elevated alkaline phosphatase level 09/19/2012   Edema 09/16/2012   Chronic vomiting 08/05/2011   Tachycardia 08/05/2011   CONSTIPATION, CHRONIC 07/30/2008   Irritable bowel syndrome 07/30/2008   Pure hypercholesterolemia 06/21/2008   POSTMENOPAUSAL STATUS 06/21/2008   VAGINITIS, ATROPHIC 06/21/2008   Past Medical History:  Diagnosis Date   Allergic rhinitis    Anemia    Arthritis    Chronic fatigue syndrome    CFIDs; also chronic joint and mucle pain (possible tik bourne illness?). Dr. Althea Jesus    Chronic gastritis    Dyspnea    GERD (gastroesophageal reflux disease)    Hepatic steatosis    History of kidney stones 05/2017   Hyperlipidemia    IBS (irritable bowel syndrome)    Lyme disease    "stari"   Migraine    Muscle twitching    Neuromuscular disorder (HCC)    Pneumonia    PONV (postoperative nausea and vomiting)    Pre-diabetes    Rectal prolapse  Schatzki's ring    Umbilical hernia    Vaginal prolapse    West Nile Virus infection 2003   Past Surgical History:  Procedure Laterality Date   5th R toe fx  '09   ABDOMINAL HYSTERECTOMY     BREAST BIOPSY  '04   neg; right   CHOLECYSTECTOMY  10/2011   COLONOSCOPY  12/02/2021   2017- 2- TAs   CYSTOSCOPY WITH STENT PLACEMENT Right 05/25/2017   Procedure: CYSTOSCOPY WITH STENT PLACEMENT;  Surgeon: Dustin Gimenez, MD;  Location: [facility];  Service: Urology;  Laterality: Right;   CYSTOSCOPY WITH STENT PLACEMENT Right 06/21/2017   Procedure: CYSTOSCOPY WITH STENT EXCHANGE;  Surgeon: Dustin Gimenez, MD;  Location: [facility];  Service: Urology;  Laterality: Right;   DIAGNOSTIC LAPAROSCOPY     EXTRACORPOREAL SHOCK WAVE LITHOTRIPSY Right  06/10/2017   Procedure: EXTRACORPOREAL SHOCK WAVE LITHOTRIPSY (ESWL);  Surgeon: Dustin Gimenez, MD;  Location: [facility];  Service: Urology;  Laterality: Right;   HOLMIUM LASER APPLICATION Right 06/21/2017   Procedure: HOLMIUM LASER APPLICATION;  Surgeon: Dustin Gimenez, MD;  Location: [facility];  Service: Urology;  Laterality: Right;   laprascopy     PARTIAL HYSTERECTOMY     bleeding   ROBOTIC ASSISTED LAPAROSCOPIC SACROCOLPOPEXY N/A 09/25/2022   Procedure: XI ROBOTIC ASSISTED LAPAROSCOPIC SACROCOLPOPEXY; BILATERAL SALPINGOOPHERECTOMY;  Surgeon: Andrez Banker, MD;  Location: [facility];  Service: Urology;  Laterality: N/A;  240 MINUTES NEEDED FOR CASE   TONSILLECTOMY     TONSILLECTOMY     ULNAR NERVE REPAIR     sx times 5   UPPER GASTROINTESTINAL ENDOSCOPY  12/04/2004   gastritis   URETEROSCOPY WITH HOLMIUM LASER LITHOTRIPSY Right 06/21/2017   Procedure: URETEROSCOPY WITH HOLMIUM LASER LITHOTRIPSY;  Surgeon: Dustin Gimenez, MD;  Location: [facility];  Service: Urology;  Laterality: Right;   Social History   Tobacco Use   Smoking status: Never   Smokeless tobacco: Never   Tobacco comments:    non smoker  Vaping Use   Vaping status: Never Used  Substance Use Topics   Alcohol use: No    Alcohol/week: 0.0 standard drinks of alcohol   Drug use: Never   Family History  Problem Relation Age of Onset   Hypertension Mother    Cancer Mother        Waldenstroms   Ulcers Mother    Diabetes Father    Hypertension Father    Stroke Father    Cancer Father    Throat cancer Father    Bladder Cancer Father    Breast cancer Sister    Alcohol abuse Maternal Grandfather    Breast cancer Paternal Grandmother    Lung cancer Paternal Grandfather    Bipolar disorder Other        some family   Colon cancer Neg Hx    Esophageal cancer Neg Hx    Pancreatic cancer Neg Hx    Ulcerative colitis Neg Hx    Allergies  Allergen Reactions   Other Nausea And Vomiting    All Antibiotics    Sulfonamide Derivatives Nausea And Vomiting   Current Outpatient Medications on File Prior to Visit  Medication Sig Dispense Refill   Cholecalciferol  (VITAMIN D ) 125 MCG (5000 UT) CAPS Take 5,000 Units by mouth daily.     docusate sodium  (COLACE) 100 MG capsule Take 1 capsule (100 mg total) by mouth 2 (two) times daily. 60 capsule 0   HM MAGNESIUM  CITRATE PO Take 2 tablets by mouth daily.  ketoconazole  (NIZORAL ) 2 % cream APPLY 1 APPLICATION. TOPICALLY DAILY. TO AFFECTED AREA 30 g 1   Multiple Vitamin (MULTIVITAMIN) tablet Take 1 tablet by mouth daily.     Multiple Vitamins-Minerals (HAIR SKIN AND NAILS FORMULA PO) Take 1 tablet by mouth daily.     pantoprazole  (PROTONIX ) 40 MG tablet TAKE 1 TABLET (40 MG TOTAL) BY MOUTH TWICE A DAY BEFORE MEALS 180 tablet 0   polyethylene glycol (MIRALAX  / GLYCOLAX ) 17 g packet Take 17 g by mouth daily.     promethazine  (PHENERGAN ) 25 MG tablet Take 1 tablet (25 mg total) by mouth every 8 (eight) hours as needed. 45 tablet 0   vitamin B-12 (CYANOCOBALAMIN ) 1000 MCG tablet Place 1,000 mcg under the tongue daily.     vitamin E  180 MG (400 UNITS) capsule Take 2 capsules (800 Units total) by mouth daily. 180 capsule 3   No current facility-administered medications on file prior to visit.    Review of Systems  Constitutional:  Positive for fatigue and fever. Negative for activity change, appetite change and unexpected weight change.  HENT:  Positive for postnasal drip, sore throat and trouble swallowing. Negative for congestion, ear pain, rhinorrhea and sinus pressure.   Eyes:  Negative for pain, redness and visual disturbance.  Respiratory:  Negative for cough, shortness of breath and wheezing.   Cardiovascular:  Negative for chest pain and palpitations.  Gastrointestinal:  Positive for nausea and vomiting. Negative for abdominal pain, blood in stool, constipation and diarrhea.  Endocrine: Negative for polydipsia and polyuria.  Genitourinary:  Negative for  dysuria, frequency and urgency.  Musculoskeletal:  Positive for arthralgias and back pain. Negative for myalgias.  Skin:  Negative for pallor and rash.  Allergic/Immunologic: Negative for environmental allergies.  Neurological:  Positive for headaches. Negative for dizziness and syncope.  Hematological:  Negative for adenopathy. Does not bruise/bleed easily.  Psychiatric/Behavioral:  Negative for decreased concentration and dysphoric mood. The patient is not nervous/anxious.        Objective:   Physical Exam Constitutional:      General: She is not in acute distress.    Appearance: She is well-developed. She is obese. She is not ill-appearing or diaphoretic.  HENT:     Head: Normocephalic and atraumatic.     Right Ear: Tympanic membrane, ear canal and external ear normal.     Left Ear: Tympanic membrane, ear canal and external ear normal.     Nose: Nose normal.     Mouth/Throat:     Mouth: Mucous membranes are moist.     Pharynx: Posterior oropharyngeal erythema present. No oropharyngeal exudate.     Comments: White patchy coating on sides of tongue consistent with thrush   Posterior throat injection without swelling, lesions or exudate Scant pnd   Mouth is not dry Eyes:     General:        Right eye: No discharge.        Left eye: No discharge.     Conjunctiva/sclera: Conjunctivae normal.     Pupils: Pupils are equal, round, and reactive to light.  Neck:     Thyroid : No thyromegaly.     Vascular: No carotid bruit or JVD.  Cardiovascular:     Rate and Rhythm: Regular rhythm. Tachycardia present.     Heart sounds: Normal heart sounds.     No gallop.  Pulmonary:     Effort: Pulmonary effort is normal. No respiratory distress.     Breath sounds: Normal breath sounds. No  stridor. No wheezing, rhonchi or rales.  Abdominal:     General: Bowel sounds are normal. There is no distension or abdominal bruit.     Palpations: Abdomen is soft. There is no mass.     Tenderness: There  is no abdominal tenderness.  Musculoskeletal:     Cervical back: Normal range of motion and neck supple.     Right lower leg: No edema.     Left lower leg: No edema.  Lymphadenopathy:     Cervical: No cervical adenopathy.  Skin:    General: Skin is warm and dry.     Coloration: Skin is not pale.     Findings: No rash.  Neurological:     Mental Status: She is alert.     Coordination: Coordination normal.     Deep Tendon Reflexes: Reflexes are normal and symmetric. Reflexes normal.  Psychiatric:        Mood and Affect: Mood normal.           Assessment & Plan:   Problem List Items Addressed This Visit       Respiratory   Strep pharyngitis - Primary   With ST, headache and low grade temp  Pt has has ST since late November but worse in past few weeks This began when she was on keflex  for another problem  Also signs of thrush on exam-could also add to ST  Today overall reassuring exam Prefers liquid medication  Prescription amox susp 500 mg tid for 10 d (zofran  for chronic nausea) Update if not starting to improve in a week or if worsening  Call back and Er precautions noted in detail today            Relevant Medications   nystatin  (MYCOSTATIN ) 100000 UNIT/ML suspension     Digestive   Thrush   Symptoms started after a course of keflex  Now-pt also has strep pharyngitis and will need another antibiotic (unfortunately)  Prescription for nystatin  solution to swish and swallow tid  Refill given to repeat if needed after course of amoxicillin    Update if not starting to improve in a week or if worsening  Call back and Er precautions noted in detail today    Also has GI follow up in in feb       Relevant Medications   nystatin  (MYCOSTATIN ) 100000 UNIT/ML suspension   Chronic vomiting   Zofran  refilled 8 mg ODT to see if this helps with nausea , enabling her to get fluids down and keep down the antibiotic for strep throat   Has follow up with GI planned in  February   Today pulse is elevated in setting of low grade temp but blood pressure is now low          Other   Elevated BP without diagnosis of hypertension   Suspect this is due to acute illness /also in setting of low grade temp  Will plan to monitor  Encouraged good fluid intake       Other Visit Diagnoses       Sore throat       Relevant Orders   Rapid Strep A (Completed)

## 2023-11-10 NOTE — Assessment & Plan Note (Signed)
 Suspect this is due to acute illness /also in setting of low grade temp  Will plan to monitor  Encouraged good fluid intake

## 2023-11-10 NOTE — Patient Instructions (Signed)
 Sip on fluids  Cold if possible   Zofran  8 mg for nausea  Amoxicillin  2 teaspoons three times daily for strep throat   Nystatin  mouthwash - swish and swallow for thrush   If any severe symptoms go to the ER   Update if not starting to improve in a week or if worsening

## 2023-11-10 NOTE — Assessment & Plan Note (Signed)
 With ST, headache and low grade temp  Pt has has ST since late November but worse in past few weeks This began when she was on keflex  for another problem  Also signs of thrush on exam-could also add to ST  Today overall reassuring exam Prefers liquid medication  Prescription amox susp 500 mg tid for 10 d (zofran  for chronic nausea) Update if not starting to improve in a week or if worsening  Call back and Er precautions noted in detail today

## 2023-11-10 NOTE — Assessment & Plan Note (Signed)
 Symptoms started after a course of keflex  Now-pt also has strep pharyngitis and will need another antibiotic (unfortunately)  Prescription for nystatin  solution to swish and swallow tid  Refill given to repeat if needed after course of amoxicillin    Update if not starting to improve in a week or if worsening  Call back and Er precautions noted in detail today    Also has GI follow up in in feb

## 2023-11-10 NOTE — Assessment & Plan Note (Signed)
 Zofran  refilled 8 mg ODT to see if this helps with nausea , enabling her to get fluids down and keep down the antibiotic for strep throat   Has follow up with GI planned in February   Today pulse is elevated in setting of low grade temp but blood pressure is now low

## 2023-11-16 ENCOUNTER — Encounter: Payer: Self-pay | Admitting: Family Medicine

## 2023-11-16 ENCOUNTER — Other Ambulatory Visit: Payer: Self-pay | Admitting: Family Medicine

## 2023-11-16 MED ORDER — ONDANSETRON 8 MG PO TBDP: 8.0000 mg | ORAL_TABLET | Freq: Three times a day (TID) | ORAL | 1 refills | Status: DC | PRN

## 2023-11-16 NOTE — Telephone Encounter (Signed)
See pt's comments on med, it says:   7 day script for 10 day course antibiotic.  All antibiotics make me sick but having esp. hard time with this one.    Last filled on 11/10/23 #20 tab/ 0 refills

## 2023-11-16 NOTE — Telephone Encounter (Signed)
I sent it  Thanks for the heads up  Hope it helps   She is taking antibiotic for strep throat  Let me know if throat pain does not improve If much better can stop the antibiotic after 7 d instead of 10  Thanks

## 2023-11-17 NOTE — Telephone Encounter (Signed)
Pt just sent a message with an update please see mychart message

## 2023-12-16 ENCOUNTER — Telehealth: Payer: Self-pay

## 2023-12-16 NOTE — Telephone Encounter (Signed)
Dr. Rhea Belton received a typed letter from patient requesting her next appointment be changed to a virtual appt due her current symptoms. Patient reports she has been having a fever, sore throat, N/V and other multiple symptoms and is under the impression we would not want her to come into the office with these symptoms. Per Dr. Rhea Belton, we can change her appt to Mychart video visit. Appt has been changed and I left a detailed message on her voicemail informing patient the appt has been changed to a MyChart video visit next week and to call our office with any questions.  See media tab on 12/16/23 for scanned in letter.

## 2023-12-21 ENCOUNTER — Encounter: Payer: Self-pay | Admitting: Family Medicine

## 2023-12-22 NOTE — Telephone Encounter (Signed)
 It has to be virtual/not phone , and if her computer service is not good unsure if she can swing it   Does she need more nystatin solution for the thrush?

## 2023-12-24 ENCOUNTER — Encounter: Payer: Self-pay | Admitting: Gastroenterology

## 2023-12-24 ENCOUNTER — Telehealth (INDEPENDENT_AMBULATORY_CARE_PROVIDER_SITE_OTHER): Payer: Medicare Other | Admitting: Gastroenterology

## 2023-12-24 DIAGNOSIS — Z860101 Personal history of adenomatous and serrated colon polyps: Secondary | ICD-10-CM

## 2023-12-24 DIAGNOSIS — R1110 Vomiting, unspecified: Secondary | ICD-10-CM | POA: Diagnosis not present

## 2023-12-24 DIAGNOSIS — Z860100 Personal history of colon polyps, unspecified: Secondary | ICD-10-CM

## 2023-12-24 DIAGNOSIS — K269 Duodenal ulcer, unspecified as acute or chronic, without hemorrhage or perforation: Secondary | ICD-10-CM | POA: Diagnosis not present

## 2023-12-24 DIAGNOSIS — K219 Gastro-esophageal reflux disease without esophagitis: Secondary | ICD-10-CM | POA: Diagnosis not present

## 2023-12-24 DIAGNOSIS — K5900 Constipation, unspecified: Secondary | ICD-10-CM | POA: Diagnosis not present

## 2023-12-24 DIAGNOSIS — K581 Irritable bowel syndrome with constipation: Secondary | ICD-10-CM

## 2023-12-24 MED ORDER — PANTOPRAZOLE SODIUM 40 MG PO TBEC: 40.0000 mg | DELAYED_RELEASE_TABLET | Freq: Every day | ORAL | 4 refills | Status: AC

## 2023-12-24 MED ORDER — PROMETHAZINE HCL 25 MG PO TABS: 25.0000 mg | ORAL_TABLET | Freq: Three times a day (TID) | ORAL | 11 refills | Status: DC | PRN

## 2023-12-24 NOTE — Progress Notes (Signed)
 Virtual Visit via Video Note  I connected with [name] Branch on 12/24/2023 at 10:30 AM by a video enabled telemedicine application and verified that I am speaking with the correct person using two identifiers.  Location: Patient: Home Provider: Office   I discussed the assessment and treatment plan with the patient. The patient was provided an opportunity to ask questions and all were answered. The patient agreed with the plan and demonstrated an understanding of the instructions. The patient was advised to call back or seek an in-person evaluation if the symptoms worsen or if the condition fails to improve as anticipated.  I provided 36 minutes of non-face-to-face time during this encounter.  Chief Complaint: Medication refill Primary GI MD: Dr. Rhea Belton  68 year old female history of chronic vomiting, GERD, hepatic steatosis, IBS, anemia, presents for evaluation of medication refill via televisit.  Patient was last seen by Doug Sou, PA-C 08/2022 for iron deficiency anemia.  Patient underwent EGD/colonoscopy which showed polyps, gastritis, duodenal ulcers.  She is well-maintained on iron and had multiple negative Hemoccults and endoscopy was pursued.  She is seen today via video visit for medication refills.  She is on Phenergan long-term for chronic vomiting.  She typically has vomiting worse when she is on antibiotics.  She is currently on antibiotics for strep throat and has run out of her Phenergan.  Her primary care gave her Zofran which she feels does not work as well.  She is on pantoprazole 40 Mg twice daily for her history of GERD and duodenal ulcers.  She would like to decrease this to once daily as she is concerned she may have palpitations due to this medication.  She also has occasional constipation that is well-controlled with an over-the-counter remedy in addition to stool softeners.   PREVIOUS GI WORKUP   Colonoscopy 12/2021:   - Two 4 to 6 mm polyps in the cecum,  removed with a cold snare. Resected and retrieved. - One 5 mm polyp in the ascending colon, removed with a cold snare. Resected and retrieved. - One 2 mm polyp in the ascending colon, removed with a cold biopsy forceps. Resected and retrieved. retrieved. - A tattoo was seen in the ascending colon. A post-polypectomy scar was found at the tattoo site. There was no evidence of residual polyp tissue. - One 4 mm polyp in the transverse colon, removed with a cold snare. Resected and retrieved   1. Surgical [P], colon, cecum, polyp (2) HYPERPLASTIC POLYPS WITH UNDERLYING HYPERPLASTIC LYMPHOID NODULES. NEGATIVE FOR DYSPLASIA. 2. Surgical [P], colon, ascending, polyp (2) TUBULAR ADENOMA AND A HYPERPLASTIC POLYP. NEGATIVE FOR HIGH-GRADE DYSPLASIA. 3. Surgical [P], colon, transverse, polyp (1) HYPERPLASTIC POLYP. NEGATIVE FOR DYSPLASIA   EGD 11/2021:   - Esophageal mucosal changes were present, including congestion (edema) and granularity. Findings are suggestive of reflux inflammation. Biopsied. - Low-grade of narrowing Schatzki ring. - Gastritis. Biopsied. - Non-bleeding duodenal ulcers. - Duodenal diverticula. - Normal second portion of the duodenum. Biopsied.   1. Surgical [P], duodenal - DUODENAL MUCOSA WITH REACTIVE CHANGES. SEE NOTE - NEGATIVE FOR INCREASED INTRAEPITHELIAL LYMPHOCYTES OR DISTORTED VILLOUS ARCHITECTURE 2. Surgical [P], gastric - GASTRIC ANTRAL AND OXYNTIC MUCOSA WITH MILD CHRONIC GASTRITIS. SEE NOTE 3. Surgical [P], distal esophagus - ESOPHAGEAL SQUAMOUS MUCOSA WITH MILD VASCULAR CONGESTION, AND FOCAL SQUAMOUS BALLOONING, SUGGESTIVE OF REFLUX ESOPHAGITIS - NEGATIVE FOR INCREASED INTRAEPITHELIAL EOSINOPHILS    Past Medical History:  Diagnosis Date   Allergic rhinitis    Anemia    Arthritis  Chronic fatigue syndrome    CFIDs; also chronic joint and mucle pain (possible tik bourne illness?). Dr. Neldon Mc    Chronic gastritis    Dyspnea    GERD  (gastroesophageal reflux disease)    Hepatic steatosis    History of kidney stones 05/2017   Hyperlipidemia    IBS (irritable bowel syndrome)    Lyme disease    "stari"   Migraine    Muscle twitching    Neuromuscular disorder (HCC)    Pneumonia    PONV (postoperative nausea and vomiting)    Pre-diabetes    Rectal prolapse    Schatzki's ring    Umbilical hernia    Vaginal prolapse    West Nile Virus infection 2003    Past Surgical History:  Procedure Laterality Date   5th R toe fx  '09   ABDOMINAL HYSTERECTOMY     BREAST BIOPSY  '04   neg; right   CHOLECYSTECTOMY  10/2011   COLONOSCOPY  12/02/2021   2017- 2- TAs   CYSTOSCOPY WITH STENT PLACEMENT Right 05/25/2017   Procedure: CYSTOSCOPY WITH STENT PLACEMENT;  Surgeon: Vanna Scotland, MD;  Location: [facility];  Service: Urology;  Laterality: Right;   CYSTOSCOPY WITH STENT PLACEMENT Right 06/21/2017   Procedure: CYSTOSCOPY WITH STENT EXCHANGE;  Surgeon: Vanna Scotland, MD;  Location: [facility];  Service: Urology;  Laterality: Right;   DIAGNOSTIC LAPAROSCOPY     EXTRACORPOREAL SHOCK WAVE LITHOTRIPSY Right 06/10/2017   Procedure: EXTRACORPOREAL SHOCK WAVE LITHOTRIPSY (ESWL);  Surgeon: Vanna Scotland, MD;  Location: [facility];  Service: Urology;  Laterality: Right;   HOLMIUM LASER APPLICATION Right 06/21/2017   Procedure: HOLMIUM LASER APPLICATION;  Surgeon: Vanna Scotland, MD;  Location: [facility];  Service: Urology;  Laterality: Right;   laprascopy     PARTIAL HYSTERECTOMY     bleeding   ROBOTIC ASSISTED LAPAROSCOPIC SACROCOLPOPEXY N/A 09/25/2022   Procedure: XI ROBOTIC ASSISTED LAPAROSCOPIC SACROCOLPOPEXY; BILATERAL SALPINGOOPHERECTOMY;  Surgeon: Crist Fat, MD;  Location: [facility];  Service: Urology;  Laterality: N/A;  240 MINUTES NEEDED FOR CASE   TONSILLECTOMY     TONSILLECTOMY     ULNAR NERVE REPAIR     sx times 5   UPPER GASTROINTESTINAL ENDOSCOPY  12/04/2004   gastritis   URETEROSCOPY WITH HOLMIUM LASER  LITHOTRIPSY Right 06/21/2017   Procedure: URETEROSCOPY WITH HOLMIUM LASER LITHOTRIPSY;  Surgeon: Vanna Scotland, MD;  Location: [facility];  Service: Urology;  Laterality: Right;    Current Outpatient Medications  Medication Sig Dispense Refill   Cholecalciferol (VITAMIN D) 125 MCG (5000 UT) CAPS Take 5,000 Units by mouth daily.     docusate sodium (COLACE) 100 MG capsule Take 1 capsule (100 mg total) by mouth 2 (two) times daily. 60 capsule 0   HM MAGNESIUM CITRATE PO Take 2 tablets by mouth daily.     Multiple Vitamin (MULTIVITAMIN) tablet Take 1 tablet by mouth daily.     Multiple Vitamins-Minerals (HAIR SKIN AND NAILS FORMULA PO) Take 1 tablet by mouth daily.     nystatin (MYCOSTATIN) 100000 UNIT/ML suspension Take 5 mLs (500,000 Units total) by mouth in the morning, at noon, and at bedtime. 120 mL 1   ondansetron (ZOFRAN-ODT) 8 MG disintegrating tablet Take 1 tablet (8 mg total) by mouth every 8 (eight) hours as needed for nausea, vomiting or refractory nausea / vomiting. Caution of sedation 60 tablet 1   polyethylene glycol (MIRALAX / GLYCOLAX) 17 g packet Take 17 g by mouth daily.     vitamin  B-12 (CYANOCOBALAMIN) 1000 MCG tablet Place 1,000 mcg under the tongue daily.     ketoconazole (NIZORAL) 2 % cream APPLY 1 APPLICATION. TOPICALLY DAILY. TO AFFECTED AREA 30 g 1   pantoprazole (PROTONIX) 40 MG tablet Take 1 tablet (40 mg total) by mouth daily. 30 tablet 4   promethazine (PHENERGAN) 25 MG tablet Take 1 tablet (25 mg total) by mouth every 8 (eight) hours as needed. 45 tablet 11   vitamin E 180 MG (400 UNITS) capsule Take 2 capsules (800 Units total) by mouth daily. (Patient not taking: Reported on 12/24/2023) 180 capsule 3   No current facility-administered medications for this visit.    Allergies as of 12/24/2023 - Review Complete 11/10/2023  Allergen Reaction Noted   Other Nausea And Vomiting 10/26/1969   Sulfonamide derivatives Nausea And Vomiting     Family History  Problem  Relation Age of Onset   Hypertension Mother    Cancer Mother        Waldenstroms   Ulcers Mother    Diabetes Father    Hypertension Father    Stroke Father    Cancer Father    Throat cancer Father    Bladder Cancer Father    Breast cancer Sister    Alcohol abuse Maternal Grandfather    Breast cancer Paternal Grandmother    Lung cancer Paternal Grandfather    Bipolar disorder Other        some family   Colon cancer Neg Hx    Esophageal cancer Neg Hx    Pancreatic cancer Neg Hx    Ulcerative colitis Neg Hx     Social History   Socioeconomic History   Marital status: Married    Spouse name: Not on file   Number of children: 0   Years of education: Not on file   Highest education level: Bachelor's degree (e.g., BA, AB, BS)  Occupational History   Occupation: owner  Tobacco Use   Smoking status: Never   Smokeless tobacco: Never   Tobacco comments:    non smoker  Vaping Use   Vaping status: Never Used  Substance and Sexual Activity   Alcohol use: No    Alcohol/week: 0.0 standard drinks of alcohol   Drug use: Never   Sexual activity: Not Currently  Other Topics Concern   Not on file  Social History Narrative   Runs a wildlife center. Married, no children.    Social Drivers of Corporate investment banker Strain: Low Risk  (11/10/2023)   Overall Financial Resource Strain (CARDIA)    Difficulty of Paying Living Expenses: Not hard at all  Food Insecurity: No Food Insecurity (11/10/2023)   Hunger Vital Sign    Worried About Running Out of Food in the Last Year: Never true    Ran Out of Food in the Last Year: Never true  Transportation Needs: No Transportation Needs (11/10/2023)   PRAPARE - Administrator, Civil Service (Medical): No    Lack of Transportation (Non-Medical): No  Physical Activity: Inactive (11/10/2023)   Exercise Vital Sign    Days of Exercise per Week: 0 days    Minutes of Exercise per Session: 0 min  Stress: Patient Declined (11/10/2023)    [employer] of Occupational Health - Occupational Stress Questionnaire    Feeling of Stress : Patient declined  Social Connections: Unknown (11/10/2023)   Social Connection and Isolation Panel [NHANES]    Frequency of Communication with Friends and Family: Patient declined  Frequency of Social Gatherings with Friends and Family: Patient declined    Attends Religious Services: Never    Database administrator or Organizations: No    Attends Banker Meetings: Never    Marital Status: Married  Catering manager Violence: Not At Risk (06/02/2023)   Humiliation, Afraid, Rape, and Kick questionnaire    Fear of Current or Ex-Partner: No    Emotionally Abused: No    Physically Abused: No    Sexually Abused: No    Review of Systems:    Constitutional: No weight loss, fever, chills, weakness or fatigue HEENT: Eyes: No change in vision               Ears, Nose, Throat:  No change in hearing or congestion Skin: No rash or itching Cardiovascular: No chest pain, chest pressure or palpitations   Respiratory: No SOB or cough Gastrointestinal: See HPI and otherwise negative Genitourinary: No dysuria or change in urinary frequency Neurological: No headache, dizziness or syncope Musculoskeletal: No new muscle or joint pain Hematologic: No bleeding or bruising Psychiatric: No history of depression or anxiety    Physical Exam:  Vital signs: There were no vitals taken for this visit.  Constitutional: NAD, Well developed, Well nourished, alert and cooperative  Neurologic:  Alert and  oriented x4;  Skin:   Dry and intact without significant lesions or rashes. Psychiatric: Oriented to person, place and time. Demonstrates good judgement and reason without abnormal affect or behaviors.   RELEVANT LABS AND IMAGING: CBC    Component Value Date/Time   WBC 4.0 06/09/2023 1518   RBC 4.28 06/09/2023 1518   HGB 10.2 (L) 06/09/2023 1518   HGB 10.3 (L) 09/01/2022 1608   HCT 32.3 (L)  06/09/2023 1518   HCT 30.9 (L) 09/01/2022 1608   PLT 203.0 06/09/2023 1518   PLT 192 09/01/2022 1608   MCV 75.5 (L) 06/09/2023 1518   MCV 73 (L) 09/01/2022 1608   MCV 82 11/17/2011 0249   MCH 24.6 (L) 04/26/2023 0223   MCHC 31.7 06/09/2023 1518   RDW 18.0 (H) 06/09/2023 1518   RDW 14.6 09/01/2022 1608   RDW 14.0 11/17/2011 0249   LYMPHSABS 1.5 06/09/2023 1518   LYMPHSABS 1.1 09/01/2022 1608   LYMPHSABS 1.2 11/17/2011 0249   MONOABS 0.7 06/09/2023 1518   MONOABS 0.7 11/17/2011 0249   EOSABS 0.2 06/09/2023 1518   EOSABS 0.1 09/01/2022 1608   EOSABS 0.2 11/17/2011 0249   BASOSABS 0.0 06/09/2023 1518   BASOSABS 0.0 09/01/2022 1608   BASOSABS 0.0 11/17/2011 0249    CMP     Component Value Date/Time   NA 137 06/09/2023 1518   NA 139 09/01/2022 1608   NA 136 11/17/2011 0249   K 3.6 06/09/2023 1518   K 3.8 11/17/2011 0249   CL 103 06/09/2023 1518   CL 97 (L) 11/17/2011 0249   CO2 24 06/09/2023 1518   CO2 25 11/17/2011 0249   GLUCOSE 102 (H) 06/09/2023 1518   GLUCOSE 103 (H) 11/17/2011 0249   BUN 11 06/09/2023 1518   BUN 8 09/01/2022 1608   BUN 4 (L) 11/17/2011 0249   CREATININE 0.51 06/09/2023 1518   CREATININE 0.55 (L) 11/17/2011 0249   CALCIUM 9.2 06/09/2023 1518   CALCIUM 8.8 11/17/2011 0249   PROT 6.5 06/09/2023 1518   PROT 6.9 09/01/2022 1608   PROT 7.2 11/16/2011 0658   ALBUMIN 3.6 06/09/2023 1518   ALBUMIN 3.5 (L) 09/01/2022 1608   ALBUMIN 2.9 (L) 11/16/2011  0658   AST 40 (H) 06/09/2023 1518   AST 34 11/16/2011 0658   ALT 20 06/09/2023 1518   ALT 34 11/16/2011 0658   ALKPHOS 101 06/09/2023 1518   ALKPHOS 84 11/16/2011 0658   BILITOT 1.5 (H) 06/09/2023 1518   BILITOT 1.2 09/01/2022 1608   BILITOT 0.9 11/16/2011 0658   GFRNONAA >60 04/26/2023 0223   GFRNONAA >60 11/17/2011 0249   GFRAA >60 07/20/2020 2021   GFRAA >60 11/17/2011 0249     Assessment/Plan:   Chronic vomiting Longstanding history of chronic vomiting which is well-controlled on Phenergan  as needed.  This is typically exacerbated when she is on antibiotics and she is currently on antibiotics for strep throat.  Previous EGD in 2023 showing ulcers and gastritis. - Refill Phenergan for 1 year.  Advised of side effects.  GERD Duodenal ulcers History of longstanding GERD and duodenal ulcers seen on EGD 2023 previously on PPI twice daily.  She feels she is having palpitations due to this medication and would like to decrease it. - Educated patient on lifestyle modifications - Can decrease to pantoprazole 40 Mg once daily, refill 1 year - If breakthrough symptoms please let us know can also add in famotidine as needed - Avoid NSAIDs - Follow-up 6 months  Iron deficiency anemia Last labs August 2024 showing stable anemia.  Previous EGD/colonoscopy done for same showed gastritis, polyps, duodenal ulcers.  Capsule endoscopy was not pursued due to multiple negative Hemoccult cards and she continues to have no overt bleeding and manages well on iron. - Labs at next office visit - Continue iron therapy  Intermittent constipation Reportedly intermittent constipation currently well-controlled on over-the-counter natural remedy in addition to stool softeners and MiraLAX as needed.  Occasional lower abdominal cramping associated with this - Continue bowel regimen as above - Can add in IBgard as needed  History of colon polyps Colonoscopy March 2023 colon polyps (1 tubular adenoma).  With a repeat of 5 years - Due for repeat colonoscopy March 2028   Boone Master, Cordelia Poche [facility] 12/24/2023, 11:05 AM  Cc: Tower, Audrie Gallus, MD

## 2023-12-24 NOTE — Progress Notes (Deleted)
.  virtualov

## 2023-12-24 NOTE — Progress Notes (Deleted)
   Virtual Visit via Video Note  I connected with @NAME @ on @TODAY @ at @CHLAPPTTIME @ by a video enabled telemedicine application and verified that I am speaking with the correct person using two identifiers.  Location: Patient: *** Provider: ***   I discussed the assessment and treatment plan with the patient. The patient was provided an opportunity to ask questions and all were answered. The patient agreed with the plan and demonstrated an understanding of the instructions. The patient was advised to call back or seek an in-person evaluation if the symptoms worsen or if the condition fails to improve as anticipated.  I provided *** minutes of non-face-to-face time during this encounter.  @TD @ @NAME @ @MRN @ @DOB @  PCP: @PCPENAME @ Primary GI doctor: {[email or web address]:[id number]}  ASSESSMENT AND PLAN:   @ASSESSPLAN @   History of Present Illness:  @AGE @ @SEX @  with a past medical history of *** and others listed below, evaluated for via video for ***   Current Medications:  @MEDRXCLASS @  Surgical History:  @CAPHE @ @PSHP @ Family History:  @CAPHIS @ @FAMHXP @ Social History:  @SOCHXP2 @  Current Medications, Allergies, Past Medical History, Past Surgical History, Family History and Social History were reviewed in [employer] record.  Physical Exam: @vs @ General Appearance: Well nourished well developed, in no apparent distress.  Eyes: conjunctiva no swelling or erythema ENT/Mouth: No hoarseness, No cough for duration of visit.  Neck: Supple  Respiratory: Respiratory effort normal, normal rate, no retractions or distress.   Cardio: Appears well-perfused, noncyanotic Musculoskeletal: no obvious deformity Skin: visible skin without rashes, ecchymosis, erythema Neuro: Awake and oriented X 3,  Psych:  normal affect, Insight and Judgment appropriate.     @MECREDENTIALS @ @TODAY @

## 2023-12-27 NOTE — Progress Notes (Signed)
 Addendum: Reviewed and agree with assessment and management plan. Asha Grumbine, Carie Caddy, MD

## 2023-12-30 MED ORDER — NYSTATIN 100000 UNIT/ML MT SUSP: 5.0000 mL | Freq: Three times a day (TID) | OROMUCOSAL | 1 refills | Status: DC

## 2023-12-30 NOTE — Telephone Encounter (Signed)
 I refilled nystatin  Please schedule virtual visit

## 2023-12-30 NOTE — Addendum Note (Signed)
 Addended by: Roxy Manns A on: 12/30/2023 03:05 PM   Modules accepted: Orders

## 2023-12-31 NOTE — Telephone Encounter (Signed)
 [email or web address]

## 2024-01-05 ENCOUNTER — Ambulatory Visit: Admitting: Family Medicine

## 2024-01-05 ENCOUNTER — Telehealth: Payer: Self-pay

## 2024-01-05 NOTE — Telephone Encounter (Signed)
 Copied from CRM [id number]. Topic: Clinical - Medical Advice >> Jan 05, 2024  2:18 PM [name] Branch wrote: Reason for CRM: Patient wanted to inform the provider she had to reschedule her appointment for 01/05/2024 at 4p because she starting vomiting and having diarrhea

## 2024-01-06 ENCOUNTER — Ambulatory Visit: Payer: Self-pay | Admitting: Family Medicine

## 2024-01-06 ENCOUNTER — Encounter: Payer: Self-pay | Admitting: Family Medicine

## 2024-01-06 NOTE — Telephone Encounter (Signed)
Please check in with her.  Thanks.

## 2024-01-06 NOTE — Telephone Encounter (Signed)
 Left message to return call to our office.

## 2024-01-06 NOTE — Telephone Encounter (Signed)
 Needs ER precautions for dehydration/ etc if not already given  Can put on list for cancellations tomorrow if possible  I am out for the day today Thanks

## 2024-01-06 NOTE — Telephone Encounter (Signed)
 [name] Mole, NP had multiple openings tomorrow at [facility], Pt declined appt., she said her sxs are not bad enough for her to come in the office and "risk getting something even worse" she just wanted to let PCP know why she cancelled yesterday.   ER precautions given  Pt said she is keeping her video visit with PCP next week. Pt advise to f/u with [place] sooner if she changes her mind.  FYI to PCP

## 2024-01-06 NOTE — Telephone Encounter (Signed)
 Chief Complaint: diarrhea/nausea/head congestion-possible URI symptoms Symptoms: diarrhea, vomited twice, nausea, head congestions Frequency: diarrhea started last night into early this morning Pertinent Negatives: Patient denies fever Disposition: [] ED /[] Urgent Care (no appt availability in office) / [] Appointment(In office/virtual)/ []  [facility]/ [] Home Care/ [] Refused Recommended Disposition /[] [facility]/ [x]  Follow-up with PCP Additional Notes: patient called with concerned for diarrhea. Patient states she normally has bowel movements 1-2 times a week. Patient states she has gone five times today. Patient endorses vomiting 2 times and nausea. Reports drinking fluid and being able to keep it down. Patient also endorses possible URI-head congestion and cold symptoms. Per protocol, patient is recommended to be seen in 24 hours. No appointments available tomorrow at PCP office. Patient already has an appointment set up with PCP this coming Monday. Patient is asking for a call back if provider thinks she needs medication. Patient verbalized understanding of the plan and all questions answered.    Copied From CRM [id number]. Reason for Triage: Diarrhea and vomiting.  Reason for Disposition  [1] MODERATE diarrhea (e.g., 4-6 times / day more than normal) AND [2] present > 48 hours (2 days)  Answer Assessment - Initial Assessment Questions 1. DIARRHEA SEVERITY: "How bad is the diarrhea?" "How many more stools have you had in the past 24 hours than normal?"    - NO DIARRHEA (SCALE 0)   - MILD (SCALE 1-3): Few loose or mushy BMs; increase of 1-3 stools over normal daily number of stools; mild increase in ostomy output.   -  MODERATE (SCALE 4-7): Increase of 4-6 stools daily over normal; moderate increase in ostomy output.   -  SEVERE (SCALE 8-10; OR "WORST POSSIBLE"): Increase of 7 or more stools daily over normal; moderate increase in ostomy output; incontinence.     Patient  states she is having five to six times a day when normally she goes 1-2 times a week 2. ONSET: "When did the diarrhea begin?"      Late Wednesday night into this morning 3. BM CONSISTENCY: "How loose or watery is the diarrhea?"      Really soft stool-not pure water 4. VOMITING: "Are you also vomiting?" If Yes, ask: "How many times in the past 24 hours?"      Yes-a couple of times 5. ABDOMEN PAIN: "Are you having any abdomen pain?" If Yes, ask: "What does it feel like?" (e.g., crampy, dull, intermittent, constant)      Yes-but not abnormal 6. ABDOMEN PAIN SEVERITY: If present, ask: "How bad is the pain?"  (e.g., Scale 1-10; mild, moderate, or severe)   - MILD (1-3): doesn't interfere with normal activities, abdomen soft and not tender to touch    - MODERATE (4-7): interferes with normal activities or awakens from sleep, abdomen tender to touch    - SEVERE (8-10): excruciating pain, doubled over, unable to do any normal activities       7 out of 10 7. ORAL INTAKE: If vomiting, "Have you been able to drink liquids?" "How much liquids have you had in the past 24 hours?"     Yes she has been able to drink fluid 8. HYDRATION: "Any signs of dehydration?" (e.g., dry mouth [not just dry lips], too weak to stand, dizziness, new weight loss) "When did you last urinate?"     Urinating well-pt endorses she does have a dry mouth but she is getting over strep throat and thrush 9. EXPOSURE: "Have you traveled to a foreign country recently?" "Have  you been exposed to anyone with diarrhea?" "Could you have eaten any food that was spoiled?"     No 10. ANTIBIOTIC USE: "Are you taking antibiotics now or have you taken antibiotics in the past 2 months?"       Was on antibiotics for strep but has been awhile 11. OTHER SYMPTOMS: "Do you have any other symptoms?" (e.g., fever, blood in stool)       Headache, cold symptoms  Protocols used: Diarrhea-A-AH

## 2024-01-06 NOTE — Telephone Encounter (Signed)
 Triage note was sent to PCP

## 2024-01-13 ENCOUNTER — Ambulatory Visit: Admitting: Family Medicine

## 2024-01-18 ENCOUNTER — Encounter: Payer: Self-pay | Admitting: Family Medicine

## 2024-01-18 ENCOUNTER — Telehealth (INDEPENDENT_AMBULATORY_CARE_PROVIDER_SITE_OTHER): Admitting: Family Medicine

## 2024-01-18 VITALS — BP 122/60 | Temp 99.3°F

## 2024-01-18 DIAGNOSIS — R350 Frequency of micturition: Secondary | ICD-10-CM

## 2024-01-18 DIAGNOSIS — R1110 Vomiting, unspecified: Secondary | ICD-10-CM

## 2024-01-18 DIAGNOSIS — D500 Iron deficiency anemia secondary to blood loss (chronic): Secondary | ICD-10-CM | POA: Diagnosis not present

## 2024-01-18 DIAGNOSIS — M810 Age-related osteoporosis without current pathological fracture: Secondary | ICD-10-CM

## 2024-01-18 DIAGNOSIS — N390 Urinary tract infection, site not specified: Secondary | ICD-10-CM | POA: Diagnosis not present

## 2024-01-18 DIAGNOSIS — Z7185 Encounter for immunization safety counseling: Secondary | ICD-10-CM | POA: Insufficient documentation

## 2024-01-18 DIAGNOSIS — K029 Dental caries, unspecified: Secondary | ICD-10-CM | POA: Insufficient documentation

## 2024-01-18 DIAGNOSIS — B370 Candidal stomatitis: Secondary | ICD-10-CM

## 2024-01-18 MED ORDER — FLUCONAZOLE 150 MG PO TABS: 150.0000 mg | ORAL_TABLET | Freq: Once | ORAL | 0 refills | Status: AC

## 2024-01-18 NOTE — Assessment & Plan Note (Signed)
 With new symptoms / urinary frequency and bladder pressure   Plans to bring sample to office Order is in  Call back and Er precautions noted in detail today

## 2024-01-18 NOTE — Progress Notes (Signed)
 Virtual Visit via Video Note  I connected with [name] Branch on 01/18/24 at  4:00 PM EDT by a video enabled telemedicine application and verified that I am speaking with the correct person using two identifiers.  Patient Location: Home Provider Location: Office/Clinic  I discussed the limitations, risks, security, and privacy concerns of performing an evaluation and management service by video and the availability of in person appointments. I also discussed with the patient that there may be a patient responsible charge related to this service. The patient expressed understanding and agreed to proceed.  Parties involved in encounter  Patient: [name] Branch   Provider:  Roxy Manns MD   Subjective: PCP: Judy Pimple, MD  Chief Complaint  Patient presents with   Follow-up    See mychart message    HPI Pt presents for follow up of thrush and other acute and chronic problems  Had a bad uri and GI virus recently  Urinary symptoms  Questions about vaccines  Anemia -can she take floradix (ferrous gluconate, B2, B6, B12 and C with herbs) over the counter  Osteoporosis treatment options    Chronic vomiting -worsened by antibiotic  Is better than it was    Tongue still has thrush  Painful to eat  Throat is no longer sore from strep  Nystatin is helping some but not going away entirely  Also has broken teeth that irritate tongue   Sees GI Had visit in feb  History of GERD and duodenal ulcers    on pantprotprazole 40 mg    Last uti was e coli in November  Resistant to macrobid and sulfa and amp   Current symptoms  Bladder pressure  Frequency  Home dip test shows positive  Flank pain occational on left  Low grade temp -not unusual for her  No blood in urine  No pain to urinate   Has not had flu shot  Has not had prevnar 20    Prediabetes Lab Results  Component Value Date   HGBA1C 5.4 06/09/2023   HGBA1C 5.4 09/01/2022   HGBA1C 5.7 (H) 05/23/2017     Anemia Lab Results  Component Value Date   WBC 4.0 06/09/2023   HGB 10.2 (L) 06/09/2023   HCT 32.3 (L) 06/09/2023   MCV 75.5 (L) 06/09/2023   PLT 203.0 06/09/2023   Lab Results  Component Value Date   IRON 36 09/01/2022   TIBC 462 (H) 09/01/2022   FERRITIN 11 (L) 09/01/2022  Gas seen hematology  IV fenofer  Iron 18 mg/ 15 ml liquid is on med list  Every time she planned her IV iron - she got sick or had family get sick      Lab Results  Component Value Date   VITAMINB12 711 06/09/2023  Takes 1000 mcg daily of B12    DEXA 06/2023 Osteoporosis Forearm TS of -4.1 Could not check TS or FN due to  inability to lie on table  History of comp fracture of L1 in past  Has kyphosis  Limited exercise due to chronic pain   Also has family history of osteoporosis   Has a lot of broken teeth and needs dental work  Going to the dentist soon  Planning extractions      Lab Results  Component Value Date   NA 137 06/09/2023   K 3.6 06/09/2023   CO2 24 06/09/2023   GLUCOSE 102 (H) 06/09/2023   BUN 11 06/09/2023   CREATININE 0.51 06/09/2023  CALCIUM 9.2 06/09/2023   GFR 96.81 06/09/2023   EGFR 101 09/01/2022   GFRNONAA >60 04/26/2023  Fatty liver  Lab Results  Component Value Date   ALT 20 06/09/2023   AST 40 (H) 06/09/2023   ALKPHOS 101 06/09/2023   BILITOT 1.5 (H) 06/09/2023     ROS: Per HPI Review of Systems  Constitutional:  Positive for malaise/fatigue. Negative for chills and fever.       Low grade temp   HENT:  Positive for sore throat. Negative for congestion, ear pain and sinus pain.        Tongue coating and discomfort   Eyes:  Negative for blurred vision, discharge and redness.  Respiratory:  Positive for cough and sputum production. Negative for shortness of breath and stridor.   Cardiovascular:  Negative for chest pain, palpitations and leg swelling.  Gastrointestinal:  Positive for heartburn and nausea. Negative for abdominal pain, diarrhea  and vomiting.  Genitourinary:  Positive for frequency and urgency. Negative for hematuria.  Musculoskeletal:  Positive for back pain, joint pain and myalgias.  Skin:  Negative for rash.  Neurological:  Negative for dizziness and headaches.     Current Outpatient Medications:    Ascorbic Acid (VITAMIN C GUMMIE PO), Take 1 capsule by mouth daily., Disp: , Rfl:    Cholecalciferol (VITAMIN D) 125 MCG (5000 UT) CAPS, Take 5,000 Units by mouth daily., Disp: , Rfl:    Cranberry-Vitamin C-Probiotic (AZO CRANBERRY) 250-30 MG TABS, Take 1 capsule by mouth daily as needed., Disp: , Rfl:    docusate sodium (COLACE) 100 MG capsule, Take 1 capsule (100 mg total) by mouth 2 (two) times daily., Disp: 60 capsule, Rfl: 0   fluconazole (DIFLUCAN) 150 MG tablet, Take 1 tablet (150 mg total) by mouth once for 1 dose., Disp: 1 tablet, Rfl: 0   Garlic 1000 MG CAPS, Take 1 capsule by mouth daily., Disp: , Rfl:    HM MAGNESIUM CITRATE PO, Take 2 tablets by mouth daily., Disp: , Rfl:    Iron 18 MG/15ML LIQD, FLORADIX LIQUID NATURAL IRON, Disp: , Rfl:    Multiple Vitamin (MULTIVITAMIN) tablet, Take 1 tablet by mouth daily., Disp: , Rfl:    Multiple Vitamins-Minerals (HAIR SKIN AND NAILS FORMULA PO), Take 1 tablet by mouth daily., Disp: , Rfl:    nystatin (MYCOSTATIN) 100000 UNIT/ML suspension, Take 5 mLs (500,000 Units total) by mouth in the morning, at noon, and at bedtime., Disp: 120 mL, Rfl: 1   ondansetron (ZOFRAN-ODT) 8 MG disintegrating tablet, Take 1 tablet (8 mg total) by mouth every 8 (eight) hours as needed for nausea, vomiting or refractory nausea / vomiting. Caution of sedation, Disp: 60 tablet, Rfl: 1   pantoprazole (PROTONIX) 40 MG tablet, Take 1 tablet (40 mg total) by mouth daily., Disp: 30 tablet, Rfl: 4   polyethylene glycol (MIRALAX / GLYCOLAX) 17 g packet, Take 17 g by mouth daily., Disp: , Rfl:    promethazine (PHENERGAN) 25 MG tablet, Take 1 tablet (25 mg total) by mouth every 8 (eight) hours as  needed., Disp: 45 tablet, Rfl: 11   vitamin B-12 (CYANOCOBALAMIN) 1000 MCG tablet, Place 1,000 mcg under the tongue daily., Disp: , Rfl:    vitamin E 180 MG (400 UNITS) capsule, Take 2 capsules (800 Units total) by mouth daily., Disp: 180 capsule, Rfl: 3  Observations/Objective: Today's Vitals   01/18/24 1559  BP: 122/60  Temp: 99.3 F (37.4 C)   Physical Exam Patient appears well, in no distress Weight  is baseline  No facial swelling or asymmetry Normal voice-not hoarse and no slurred speech (unable to see tongue well enough to eval for thrush) No obvious tremor or mobility impairment Moving neck and UEs normally Able to hear the call well  No cough or shortness of breath during interview  Talkative and mentally sharp with no cognitive changes No skin changes on face or neck , no rash or pallor Affect is normal   Assessment and Plan: Thrush Assessment & Plan: Treating with nystatin  Coating on tongue and pain  Some improvement with nystatin but not resolved Unable to assess well on camera  Sent prescription for diflucan to pharmacy  Update if not starting to improve in a week or if worsening  Call back and Er precautions noted in detail today     Age-related osteoporosis without current pathological fracture Assessment & Plan: Last dexa unable to check LS and FN due to inability to lie on table  Forearm T score -4.1  On vit D Unable to exercise due to chronic fatigue and pain   Discussed future options for treatment including fosamax and prolia Given info to read  Has to have teeth extracted and heal before we can start any treatment  Discussed fall prevention, supplements and exercise for bone density     Frequent UTI Assessment & Plan: With new symptoms / urinary frequency and bladder pressure   Plans to bring sample to office Order is in  Call back and Er precautions noted in detail today    Orders: -     Urinalysis w microscopic + reflex cultur;  Future  Iron deficiency anemia due to chronic blood loss Assessment & Plan: Previously ref to heme  Was planning IV iron but got sick/ had family issues  No wants to consider again  Hb of 10.2  Suspect past ref expired  Re placed referral to hematology today  Stays fatigued   Orders: -     Ambulatory referral to Hematology / Oncology  Dental decay Assessment & Plan: Pt has dental decay and broken teeth Planning to start some dental care Plans on extraction of all teeth  Expect this would greatly improve health   Then would be candidate (after healing) to start medication for osteoporosis    FREQUENCY, URINARY Assessment & Plan: Future order for urinalysis  Husband will bring sample Some bladder pressure Per pt home dip test positive for possible infection   Orders: -     Urinalysis w microscopic + reflex cultur; Future  Chronic vomiting Assessment & Plan: This worsens after antibiotics  Is intermittent  Phenergan and zofran prn    Vaccine counseling Assessment & Plan: Discussed vaccines avail to her  Flu -late in season  Prevnar 20- recommend  Covid -19 recommend  RSV  Shingrix- declines   Tetanus due this summer    Other orders -     Fluconazole; Take 1 tablet (150 mg total) by mouth once for 1 dose.  Dispense: 1 tablet; Refill: 0    Follow Up Instructions: No follow-ups on file.  Flu shot is available here  I usually recommend this for the fall  Also prevnar 20 vaccine for pneumonia   In the pharmacy  I also recommend a covid shot once per year  RSV is a one and done shot   I will work on a re referral (if needed) for hematology) so you can call and schedule  Or they will call yo uin 1-2 weeks  Take a look at the info on osteoporosis treatment  We need to have your dental work done before considering any    Try diflucan for thrush and let us know how it goes   Drop off urine sample for urinalysis and culture when able     I  discussed the assessment and treatment plan with the patient. The patient was provided an opportunity to ask questions, and all were answered. The patient agreed with the plan and demonstrated an understanding of the instructions.   The patient was advised to call back or seek an in-person evaluation if the symptoms worsen or if the condition fails to improve as anticipated.  The above assessment and management plan was discussed with the patient. The patient verbalized understanding of and has agreed to the management plan.   Roxy Manns, MD

## 2024-01-18 NOTE — Assessment & Plan Note (Signed)
 Discussed vaccines avail to her  Flu -late in season  Prevnar 20- recommend  Covid -19 recommend  RSV  Shingrix- declines   Tetanus due this summer

## 2024-01-18 NOTE — Assessment & Plan Note (Signed)
 This worsens after antibiotics  Is intermittent  Phenergan and zofran prn

## 2024-01-18 NOTE — Assessment & Plan Note (Signed)
 Last dexa unable to check LS and FN due to inability to lie on table  Forearm T score -4.1  On vit D Unable to exercise due to chronic fatigue and pain   Discussed future options for treatment including fosamax and prolia Given info to read  Has to have teeth extracted and heal before we can start any treatment  Discussed fall prevention, supplements and exercise for bone density

## 2024-01-18 NOTE — Patient Instructions (Addendum)
 Flu shot is available here  I usually recommend this for the fall  Also prevnar 20 vaccine for pneumonia   In the pharmacy  I also recommend a covid shot once per year  RSV is a one and done shot   I will work on a re referral (if needed) for hematology) so you can call and schedule  Or they will call yo uin 1-2 weeks   Take a look at the info on osteoporosis treatment  We need to have your dental work done before considering any    Try diflucan for thrush and let us know how it goes   Drop off urine sample for urinalysis and culture when able

## 2024-01-18 NOTE — Assessment & Plan Note (Signed)
 Previously ref to heme  Was planning IV iron but got sick/ had family issues  No wants to consider again  Hb of 10.2  Suspect past ref expired  Re placed referral to hematology today  Stays fatigued

## 2024-01-18 NOTE — Assessment & Plan Note (Signed)
 Treating with nystatin  Coating on tongue and pain  Some improvement with nystatin but not resolved Unable to assess well on camera  Sent prescription for diflucan to pharmacy  Update if not starting to improve in a week or if worsening  Call back and Er precautions noted in detail today

## 2024-01-18 NOTE — Assessment & Plan Note (Signed)
 Future order for urinalysis  Husband will bring sample Some bladder pressure Per pt home dip test positive for possible infection

## 2024-01-18 NOTE — Assessment & Plan Note (Signed)
 Pt has dental decay and broken teeth Planning to start some dental care Plans on extraction of all teeth  Expect this would greatly improve health   Then would be candidate (after healing) to start medication for osteoporosis

## 2024-01-20 ENCOUNTER — Other Ambulatory Visit: Payer: Self-pay

## 2024-01-20 DIAGNOSIS — N390 Urinary tract infection, site not specified: Secondary | ICD-10-CM

## 2024-01-20 DIAGNOSIS — R350 Frequency of micturition: Secondary | ICD-10-CM

## 2024-01-23 ENCOUNTER — Encounter: Payer: Self-pay | Admitting: Family Medicine

## 2024-01-23 LAB — URINE CULTURE
MICRO NUMBER:: 16260571
SPECIMEN QUALITY:: ADEQUATE

## 2024-01-23 LAB — URINALYSIS W MICROSCOPIC + REFLEX CULTURE
Bilirubin Urine: NEGATIVE
Glucose, UA: NEGATIVE
Hgb urine dipstick: NEGATIVE
Nitrites, Initial: POSITIVE — AB
Specific Gravity, Urine: 1.026 (ref 1.001–1.035)
WBC, UA: >=60 /HPF — AB (ref 0–5)
pH: 5.5 (ref 5.0–8.0)

## 2024-01-23 LAB — CULTURE INDICATED

## 2024-01-24 ENCOUNTER — Telehealth: Payer: Self-pay | Admitting: Oncology

## 2024-01-24 MED ORDER — CEPHALEXIN 500 MG PO CAPS: 500.0000 mg | ORAL_CAPSULE | Freq: Two times a day (BID) | ORAL | 0 refills | Status: DC

## 2024-01-24 MED ORDER — ONDANSETRON 8 MG PO TBDP: 8.0000 mg | ORAL_TABLET | Freq: Three times a day (TID) | ORAL | 1 refills | Status: DC | PRN

## 2024-01-24 NOTE — Telephone Encounter (Signed)
 VM left on mobile and home phones. Referral sent from Dr. Milinda Antis for patient to reschedule her missed appointments with Dr. Cathie Hoops here at the [facility]. Per last note in fall of 2024: "please arrange her to do labs prior to MD +/- Venofer, check iron tibc ferritin".

## 2024-02-21 ENCOUNTER — Encounter: Payer: Self-pay | Admitting: Family Medicine

## 2024-02-22 ENCOUNTER — Ambulatory Visit: Payer: Self-pay

## 2024-02-22 NOTE — Telephone Encounter (Signed)
 Patient called back spoke to triage. Seen note for further information.

## 2024-02-22 NOTE — Telephone Encounter (Signed)
 Chief Complaint: Nausea - see notes Symptoms: see notes Frequency: 2 weeks Pertinent Negatives: Patient denies dizziness Disposition: [] ED /[] Urgent Care (no appt availability in office) / [x] Appointment(In office/virtual)/ []  [facility]/ [] Home Care/ [] Refused Recommended Disposition /[] [facility]/ []  Follow-up with PCP Additional Notes: Patient called in stating she was encouraged to call by the office after messaging via mychart. Patient has had symptoms for 2 weeks of nausea, intermittent vomiting, weakness. Patient states she was treated for a UTI back towards end of last month and thinks it may have returned. Patient took a home test yesterday and states it was positive. Patient also states she has had lower back pain. Patient appt made for tomorrow for evaluation.    Reason for Disposition  [1] MODERATE weakness (i.e., interferes with work, school, normal activities) AND [2] persists > 3 days  Answer Assessment - Initial Assessment Questions 1. DESCRIPTION: "Describe how you are feeling."     Weakness, shakiness 2. SEVERITY: "How bad is it?"  "Can you stand and walk?"   - MILD (0-3): Feels weak or tired, but does not interfere with work, school or normal activities.   - MODERATE (4-7): Able to stand and walk; weakness interferes with work, school, or normal activities.   - SEVERE (8-10): Unable to stand or walk; unable to do usual activities.     Moderate 3. ONSET: "When did these symptoms begin?" (e.g., hours, days, weeks, months)     2 weeks ago 4. CAUSE: "What do you think is causing the weakness or fatigue?" (e.g., not drinking enough fluids, medical problem, trouble sleeping)     Unsure, patient has same symptoms as husband  5. NEW MEDICINES:  "Have you started on any new medicines recently?" (e.g., opioid pain medicines, benzodiazepines, muscle relaxants, antidepressants, antihistamines, neuroleptics, beta blockers)     No 6. OTHER SYMPTOMS: "Do you  have any other symptoms?" (e.g., chest pain, fever, cough, SOB, vomiting, diarrhea, bleeding, other areas of pain)     Positive for home UTI test kit, occasional panic feel with high BP  Answer Assessment - Initial Assessment Questions 1. NAUSEA SEVERITY: "How bad is the nausea?" (e.g., mild, moderate, severe; dehydration, weight loss)   - MILD: loss of appetite without change in eating habits   - MODERATE: decreased oral intake without significant weight loss, dehydration, or malnutrition   - SEVERE: inadequate caloric or fluid intake, significant weight loss, symptoms of dehydration     Moderate  2. ONSET: "When did the nausea begin?"     2 weeks ago 3. VOMITING: "Any vomiting?" If Yes, ask: "How many times today?"     Last vomiting occurred on Sunday  4. RECURRENT SYMPTOM: "Have you had nausea before?" If Yes, ask: "When was the last time?" "What happened that time?"     N/a 5. CAUSE: "What do you think is causing the nausea?"     Unsure  Protocols used: Nausea-A-AH, Weakness (Generalized) and Fatigue-A-AH

## 2024-02-22 NOTE — Telephone Encounter (Signed)
 Called all numbers listed for patient contact. Have either left message or no answer at all . Will attempt to reach patient at later time. Sending my chart message to call office as well.

## 2024-02-22 NOTE — Telephone Encounter (Signed)
 Per Dr. Malissa Se pt needs to be triaged and be seen earlier. No labs will be done until her appt, routing to Triage pool

## 2024-02-22 NOTE — Telephone Encounter (Signed)
I will see her then Agree with ER precautions

## 2024-02-23 ENCOUNTER — Telehealth: Payer: Self-pay | Admitting: Family Medicine

## 2024-02-23 ENCOUNTER — Ambulatory Visit: Admitting: Family Medicine

## 2024-02-23 ENCOUNTER — Encounter: Payer: Self-pay | Admitting: Family Medicine

## 2024-02-23 VITALS — HR 119 | Temp 99.5°F | Ht 66.0 in

## 2024-02-23 DIAGNOSIS — R000 Tachycardia, unspecified: Secondary | ICD-10-CM | POA: Diagnosis not present

## 2024-02-23 DIAGNOSIS — Z87440 Personal history of urinary (tract) infections: Secondary | ICD-10-CM

## 2024-02-23 DIAGNOSIS — R1110 Vomiting, unspecified: Secondary | ICD-10-CM | POA: Diagnosis not present

## 2024-02-23 DIAGNOSIS — K581 Irritable bowel syndrome with constipation: Secondary | ICD-10-CM

## 2024-02-23 DIAGNOSIS — K029 Dental caries, unspecified: Secondary | ICD-10-CM

## 2024-02-23 DIAGNOSIS — R7303 Prediabetes: Secondary | ICD-10-CM | POA: Diagnosis not present

## 2024-02-23 DIAGNOSIS — Z79899 Other long term (current) drug therapy: Secondary | ICD-10-CM | POA: Diagnosis not present

## 2024-02-23 DIAGNOSIS — K760 Fatty (change of) liver, not elsewhere classified: Secondary | ICD-10-CM

## 2024-02-23 DIAGNOSIS — R748 Abnormal levels of other serum enzymes: Secondary | ICD-10-CM | POA: Diagnosis not present

## 2024-02-23 DIAGNOSIS — D500 Iron deficiency anemia secondary to blood loss (chronic): Secondary | ICD-10-CM | POA: Diagnosis not present

## 2024-02-23 DIAGNOSIS — N390 Urinary tract infection, site not specified: Secondary | ICD-10-CM

## 2024-02-23 DIAGNOSIS — B370 Candidal stomatitis: Secondary | ICD-10-CM

## 2024-02-23 DIAGNOSIS — K2950 Unspecified chronic gastritis without bleeding: Secondary | ICD-10-CM

## 2024-02-23 LAB — POC URINALSYSI DIPSTICK (AUTOMATED)
Bilirubin, UA: NEGATIVE
Blood, UA: NEGATIVE
Glucose, UA: NEGATIVE
Ketones, UA: NEGATIVE
Leukocytes, UA: NEGATIVE
Nitrite, UA: NEGATIVE
Protein, UA: NEGATIVE
Spec Grav, UA: 1.02 (ref 1.010–1.025)
Urobilinogen, UA: 0.2 U/dL
pH, UA: 6.5 (ref 5.0–8.0)

## 2024-02-23 LAB — HEPATIC FUNCTION PANEL
ALT: 16 U/L (ref 0–35)
AST: 36 U/L (ref 0–37)
Albumin: 3.7 g/dL (ref 3.5–5.2)
Alkaline Phosphatase: 133 U/L — ABNORMAL HIGH (ref 39–117)
Bilirubin, Direct: 0.4 mg/dL — ABNORMAL HIGH (ref 0.0–0.3)
Total Bilirubin: 1.2 mg/dL (ref 0.2–1.2)
Total Protein: 7.2 g/dL (ref 6.0–8.3)

## 2024-02-23 LAB — CBC WITH DIFFERENTIAL/PLATELET
Basophils Absolute: 0.1 10*3/uL (ref 0.0–0.1)
Basophils Relative: 1.1 % (ref 0.0–3.0)
Eosinophils Absolute: 0.1 10*3/uL (ref 0.0–0.7)
Eosinophils Relative: 2.4 % (ref 0.0–5.0)
HCT: 26.2 % — ABNORMAL LOW (ref 36.0–46.0)
Hemoglobin: 7.4 g/dL — CL (ref 12.0–15.0)
Lymphocytes Relative: 41.2 % (ref 12.0–46.0)
Lymphs Abs: 2.4 10*3/uL (ref 0.7–4.0)
MCHC: 28.4 g/dL — ABNORMAL LOW (ref 30.0–36.0)
MCV: 55 fl — ABNORMAL LOW (ref 78.0–100.0)
Monocytes Absolute: 0.7 10*3/uL (ref 0.1–1.0)
Monocytes Relative: 11.2 % (ref 3.0–12.0)
Neutro Abs: 2.6 10*3/uL (ref 1.4–7.7)
Neutrophils Relative %: 44.1 % (ref 43.0–77.0)
Platelets: 309 10*3/uL (ref 150.0–400.0)
RBC: 4.77 Mil/uL (ref 3.87–5.11)
RDW: 24.4 % — ABNORMAL HIGH (ref 11.5–15.5)
WBC: 5.8 10*3/uL (ref 4.0–10.5)

## 2024-02-23 LAB — BASIC METABOLIC PANEL WITH GFR
BUN: 8 mg/dL (ref 6–23)
CO2: 27 meq/L (ref 19–32)
Calcium: 9 mg/dL (ref 8.4–10.5)
Chloride: 101 meq/L (ref 96–112)
Creatinine, Ser: 0.49 mg/dL (ref 0.40–1.20)
GFR: 97.27 mL/min (ref >60.00)
Glucose, Bld: 97 mg/dL (ref 70–99)
Potassium: 3.9 meq/L (ref 3.5–5.1)
Sodium: 137 meq/L (ref 135–145)

## 2024-02-23 LAB — TSH: TSH: 1.36 u[IU]/mL (ref 0.35–5.50)

## 2024-02-23 LAB — HEMOGLOBIN A1C: Hgb A1c MFr Bld: 5.1 % (ref 4.6–6.5)

## 2024-02-23 LAB — FERRITIN: Ferritin: 2.6 ng/mL — ABNORMAL LOW (ref 10.0–291.0)

## 2024-02-23 LAB — IRON: Iron: 23 ug/dL — ABNORMAL LOW (ref 42–145)

## 2024-02-23 LAB — VITAMIN B12: Vitamin B-12: >1537 pg/mL — ABNORMAL HIGH (ref 211–911)

## 2024-02-23 MED ORDER — NYSTATIN 100000 UNIT/ML MT SUSP: 5.0000 mL | Freq: Three times a day (TID) | OROMUCOSAL | 1 refills | Status: DC

## 2024-02-23 NOTE — Telephone Encounter (Signed)
 Your Hb is very low (along with iron  and iron  stores)  We need to get you back to hematology to discuss iron  infusion  (you saw Dr Wilhelmenia Harada in the past)  Is this ok?  Let me know and I will refer   I will comment on rest of labs when I see them

## 2024-02-23 NOTE — Assessment & Plan Note (Signed)
 Ongoing  In setting of dental decay ? If worsens nausea   Sent in refill of nystatin  solution

## 2024-02-23 NOTE — Patient Instructions (Signed)
 Labs today  Keep sipping fluids to prevent dehydration  Treat thrush   Urinalysis is clear today   Here is a handout on fodmap diet

## 2024-02-23 NOTE — Assessment & Plan Note (Signed)
 Pt continues protonix  and prevacid  Keeps heartburn also

## 2024-02-23 NOTE — Assessment & Plan Note (Signed)
 Urinalysis is clear today  Reassuring

## 2024-02-23 NOTE — Assessment & Plan Note (Signed)
 Iron  and ferritin today with cbc No iron  infusion in long time

## 2024-02-23 NOTE — Progress Notes (Signed)
 Subjective:    Patient ID: [name] Branch, female    DOB: 02/13/1956, 68 y.o.   MRN: [id number]  HPI  Wt Readings from Last 3 Encounters:  06/09/23 175 lb (79.4 kg)  06/02/23 175 lb (79.4 kg)  04/26/23 175 lb (79.4 kg)   28.25 kg/m  Vitals:   02/23/24 1224  Pulse: (!) 119  Temp: 99.5 F (37.5 C)  SpO2: 98%     Pt presents with nausea/vomiting  (possibly 2 weeks but worsening) Did take her anti nausea medicines  Possible uti   Started with stomach cramps and frequent bms (husband had it also) He only had it a few days   Stools were soft - not watery  Nausea /vomiting started 4-5 days later  Phenergan  and zofran  help but do not make it go away  Not much vomiting because she was not eating   Set a timer and took a sip of tea every 15 minutes to stay hydrated    Then got worse  Could not eat  Lay around-not eating    Last night- had a little solid food - pasta with garlic and broccoli  This am a little better -moderate  Zofran  this am  Did not try to eat this am  Bloating and gas right now   Next GI appointment set up in August   Takes pantoprazole  40 mg daily or less  Pepcid  prn  Some heartburn      Urinalysis today  Results for orders placed or performed in visit on 02/23/24  POCT Urinalysis Dipstick (Automated)   Collection Time: 02/23/24 12:35 PM  Result Value Ref Range   Color, UA Dark Yellow    Clarity, UA Clear    Glucose, UA Negative Negative   Bilirubin, UA negative    Ketones, UA negative    Spec Grav, UA 1.020 1.010 - 1.025   Blood, UA negative    pH, UA 6.5 5.0 - 8.0   Protein, UA Negative Negative   Urobilinogen, UA 0.2 0.2 or 1.0 E.U./dL   Nitrite, UA negative    Leukocytes, UA Negative Negative     Prone to utis  Last culture late march E coli  Resistant to amp, augmentin  , nitrofurantoin  and trimeth/sulfa -was treated with keflex    All antibiotic cause nausea   Lab Results  Component Value Date   NA 137 06/09/2023    K 3.6 06/09/2023   CO2 24 06/09/2023   GLUCOSE 102 (H) 06/09/2023   BUN 11 06/09/2023   CREATININE 0.51 06/09/2023   CALCIUM 9.2 06/09/2023   GFR 96.81 06/09/2023   EGFR 101 09/01/2022   GFRNONAA >60 04/26/2023   Lab Results  Component Value Date   ALT 20 06/09/2023   AST 40 (H) 06/09/2023   ALKPHOS 101 06/09/2023   BILITOT 1.5 (H) 06/09/2023    Lab Results  Component Value Date   WBC 4.0 06/09/2023   HGB 10.2 (L) 06/09/2023   HCT 32.3 (L) 06/09/2023   MCV 75.5 (L) 06/09/2023   PLT 203.0 06/09/2023     Patient Active Problem List   Diagnosis Date Noted   Dental decay 01/18/2024   Vaccine counseling 01/18/2024   Thrush 11/10/2023   Elevated BP without diagnosis of hypertension 11/10/2023   Current use of proton pump inhibitor 06/09/2023   Scoliosis 06/09/2023   Iron  deficiency 06/09/2023   Nausea 06/09/2023   Fatty liver 09/09/2022   IDA (iron  deficiency anemia) 09/04/2022   Pre-operative cardiovascular examination 09/01/2022  Gastritis 02/10/2022   Female bladder prolapse 02/10/2022   Encounter for screening mammogram for breast cancer 02/10/2022   Other chronic pain    Elevated liver enzymes 08/28/2020   Compression fracture of L1 lumbar vertebra (HCC) 08/28/2020   Mixed stress and urge urinary incontinence 10/29/2017   Frequent UTI 02/18/2016   Tremor 11/12/2015   Constipation 04/12/2015   Cystocele, unspecified 04/12/2015   Routine general medical examination at a health care facility 06/26/2014   Osteoporosis 06/26/2014   Prediabetes 06/26/2014   Colon cancer screening 06/26/2014   Intertrigo 07/12/2013   Chronic fatigue syndrome 01/19/2013   Elevated alkaline phosphatase level 09/19/2012   Edema 09/16/2012   Chronic vomiting 08/05/2011   Tachycardia 08/05/2011   FREQUENCY, URINARY 08/06/2010   CONSTIPATION, CHRONIC 07/30/2008   Irritable bowel syndrome 07/30/2008   Pure hypercholesterolemia 06/21/2008   POSTMENOPAUSAL STATUS 06/21/2008    VAGINITIS, ATROPHIC 06/21/2008   Past Medical History:  Diagnosis Date   Allergic rhinitis    Anemia    Arthritis    Chronic fatigue syndrome    CFIDs; also chronic joint and mucle pain (possible tik bourne illness?). Dr. Althea Jesus    Chronic gastritis    Dyspnea    GERD (gastroesophageal reflux disease)    Hepatic steatosis    History of kidney stones 05/2017   Hyperlipidemia    IBS (irritable bowel syndrome)    Lyme disease    "stari"   Migraine    Muscle twitching    Neuromuscular disorder (HCC)    Pneumonia    PONV (postoperative nausea and vomiting)    Pre-diabetes    Rectal prolapse    Schatzki's ring    Umbilical hernia    Vaginal prolapse    West Nile Virus infection 2003   Past Surgical History:  Procedure Laterality Date   5th R toe fx  '09   ABDOMINAL HYSTERECTOMY     BREAST BIOPSY  '04   neg; right   CHOLECYSTECTOMY  10/2011   COLONOSCOPY  12/02/2021   2017- 2- TAs   CYSTOSCOPY WITH STENT PLACEMENT Right 05/25/2017   Procedure: CYSTOSCOPY WITH STENT PLACEMENT;  Surgeon: Dustin Gimenez, MD;  Location: [facility];  Service: Urology;  Laterality: Right;   CYSTOSCOPY WITH STENT PLACEMENT Right 06/21/2017   Procedure: CYSTOSCOPY WITH STENT EXCHANGE;  Surgeon: Dustin Gimenez, MD;  Location: [facility];  Service: Urology;  Laterality: Right;   DIAGNOSTIC LAPAROSCOPY     EXTRACORPOREAL SHOCK WAVE LITHOTRIPSY Right 06/10/2017   Procedure: EXTRACORPOREAL SHOCK WAVE LITHOTRIPSY (ESWL);  Surgeon: Dustin Gimenez, MD;  Location: [facility];  Service: Urology;  Laterality: Right;   HOLMIUM LASER APPLICATION Right 06/21/2017   Procedure: HOLMIUM LASER APPLICATION;  Surgeon: Dustin Gimenez, MD;  Location: [facility];  Service: Urology;  Laterality: Right;   laprascopy     PARTIAL HYSTERECTOMY     bleeding   ROBOTIC ASSISTED LAPAROSCOPIC SACROCOLPOPEXY N/A 09/25/2022   Procedure: XI ROBOTIC ASSISTED LAPAROSCOPIC SACROCOLPOPEXY; BILATERAL SALPINGOOPHERECTOMY;  Surgeon: Andrez Banker, MD;  Location: [facility];  Service: Urology;  Laterality: N/A;  240 MINUTES NEEDED FOR CASE   TONSILLECTOMY     TONSILLECTOMY     ULNAR NERVE REPAIR     sx times 5   UPPER GASTROINTESTINAL ENDOSCOPY  12/04/2004   gastritis   URETEROSCOPY WITH HOLMIUM LASER LITHOTRIPSY Right 06/21/2017   Procedure: URETEROSCOPY WITH HOLMIUM LASER LITHOTRIPSY;  Surgeon: Dustin Gimenez, MD;  Location: [facility];  Service: Urology;  Laterality: Right;   Social History  Tobacco Use   Smoking status: Never   Smokeless tobacco: Never   Tobacco comments:    non smoker  Vaping Use   Vaping status: Never Used  Substance Use Topics   Alcohol use: No    Alcohol/week: 0.0 standard drinks of alcohol   Drug use: Never   Family History  Problem Relation Age of Onset   Hypertension Mother    Cancer Mother        Waldenstroms   Ulcers Mother    Diabetes Father    Hypertension Father    Stroke Father    Cancer Father    Throat cancer Father    Bladder Cancer Father    Breast cancer Sister    Alcohol abuse Maternal Grandfather    Breast cancer Paternal Grandmother    Lung cancer Paternal Grandfather    Bipolar disorder Other        some family   Colon cancer Neg Hx    Esophageal cancer Neg Hx    Pancreatic cancer Neg Hx    Ulcerative colitis Neg Hx    Allergies  Allergen Reactions   Other Nausea And Vomiting    All Antibiotics   Sulfonamide Derivatives Nausea And Vomiting   Current Outpatient Medications on File Prior to Visit  Medication Sig Dispense Refill   Ascorbic Acid (VITAMIN C GUMMIE PO) Take 1 capsule by mouth daily.     cephALEXin  (KEFLEX ) 500 MG capsule Take 1 capsule (500 mg total) by mouth 2 (two) times daily. 14 capsule 0   Cholecalciferol  (VITAMIN D ) 125 MCG (5000 UT) CAPS Take 5,000 Units by mouth daily.     Cranberry-Vitamin C-Probiotic (AZO CRANBERRY) 250-30 MG TABS Take 1 capsule by mouth daily as needed.     docusate sodium  (COLACE) 100 MG capsule Take 1 capsule  (100 mg total) by mouth 2 (two) times daily. 60 capsule 0   fluconazole  (DIFLUCAN ) 150 MG tablet Take 150 mg by mouth once.     Garlic 1000 MG CAPS Take 1 capsule by mouth daily.     HM MAGNESIUM  CITRATE PO Take 2 tablets by mouth daily.     Iron  18 MG/15ML LIQD FLORADIX LIQUID NATURAL IRON      Multiple Vitamin (MULTIVITAMIN) tablet Take 1 tablet by mouth daily.     Multiple Vitamins-Minerals (HAIR SKIN AND NAILS FORMULA PO) Take 1 tablet by mouth daily.     nystatin  (MYCOSTATIN ) 100000 UNIT/ML suspension Take 5 mLs (500,000 Units total) by mouth in the morning, at noon, and at bedtime. 120 mL 1   ondansetron  (ZOFRAN -ODT) 8 MG disintegrating tablet Take 1 tablet (8 mg total) by mouth every 8 (eight) hours as needed for nausea, vomiting or refractory nausea / vomiting. Caution of sedation 60 tablet 1   polyethylene glycol (MIRALAX  / GLYCOLAX ) 17 g packet Take 17 g by mouth daily.     promethazine  (PHENERGAN ) 25 MG tablet Take 1 tablet (25 mg total) by mouth every 8 (eight) hours as needed. 45 tablet 11   vitamin B-12 (CYANOCOBALAMIN ) 1000 MCG tablet Place 1,000 mcg under the tongue daily.     vitamin E  180 MG (400 UNITS) capsule Take 2 capsules (800 Units total) by mouth daily. 180 capsule 3   pantoprazole  (PROTONIX ) 40 MG tablet Take 1 tablet (40 mg total) by mouth daily. 30 tablet 4   No current facility-administered medications on file prior to visit.    Review of Systems  Constitutional:  Positive for fatigue. Negative for activity change, appetite  change, fever and unexpected weight change.  HENT:  Positive for dental problem. Negative for congestion, ear pain, rhinorrhea, sinus pressure and sore throat.        Thrush on tongue on left side  Broken teeth  Eyes:  Negative for pain, redness and visual disturbance.  Respiratory:  Negative for cough, shortness of breath and wheezing.   Cardiovascular:  Negative for chest pain and palpitations.  Gastrointestinal:  Positive for abdominal  distention, abdominal pain and constipation. Negative for anal bleeding, blood in stool, diarrhea and vomiting.  Endocrine: Negative for polydipsia and polyuria.  Genitourinary:  Negative for dysuria, frequency and urgency.  Musculoskeletal:  Negative for arthralgias, back pain and myalgias.  Allergic/Immunologic: Negative for environmental allergies.  Neurological:  Negative for dizziness, syncope and headaches.  Hematological:  Negative for adenopathy. Does not bruise/bleed easily.  Psychiatric/Behavioral:  Negative for decreased concentration and dysphoric mood. The patient is not nervous/anxious.        Objective:   Physical Exam Constitutional:      General: She is not in acute distress.    Appearance: Normal appearance. She is well-developed. She is obese. She is not ill-appearing or diaphoretic.     Comments: Frail /chronically ill appearing In wheelchair   HENT:     Head: Normocephalic and atraumatic.  Eyes:     Conjunctiva/sclera: Conjunctivae normal.     Pupils: Pupils are equal, round, and reactive to light.  Neck:     Thyroid : No thyromegaly.     Vascular: No carotid bruit or JVD.  Cardiovascular:     Rate and Rhythm: Normal rate and regular rhythm.     Heart sounds: Normal heart sounds.     No gallop.  Pulmonary:     Effort: Pulmonary effort is normal. No respiratory distress.     Breath sounds: Normal breath sounds. No wheezing or rales.  Abdominal:     General: There is no distension or abdominal bruit.     Palpations: Abdomen is soft. There is no mass.     Tenderness: There is no abdominal tenderness. There is no guarding or rebound.     Hernia: No hernia is present.  Musculoskeletal:     Cervical back: Normal range of motion and neck supple.     Right lower leg: No edema.     Left lower leg: No edema.  Lymphadenopathy:     Cervical: No cervical adenopathy.  Skin:    General: Skin is warm and dry.     Coloration: Skin is not pale.     Findings: No rash.   Neurological:     Mental Status: She is alert.     Cranial Nerves: No cranial nerve deficit.     Coordination: Coordination normal.     Deep Tendon Reflexes: Reflexes are normal and symmetric. Reflexes normal.  Psychiatric:        Mood and Affect: Mood normal.           Assessment & Plan:   Problem List Items Addressed This Visit       Digestive   Thrush   Relevant Medications   fluconazole  (DIFLUCAN ) 150 MG tablet   Fatty liver   Dental decay   Relevant Medications   fluconazole  (DIFLUCAN ) 150 MG tablet   Chronic vomiting     Other   Prediabetes   IDA (iron  deficiency anemia)   Current use of proton pump inhibitor   Other Visit Diagnoses       History of UTI    -  Primary   Relevant Medications   fluconazole  (DIFLUCAN ) 150 MG tablet   Other Relevant Orders   POCT Urinalysis Dipstick (Automated) (Completed)

## 2024-02-23 NOTE — Assessment & Plan Note (Signed)
Given handout on FODMAP diet

## 2024-02-23 NOTE — Assessment & Plan Note (Signed)
 Pt cannot get this taken care of (infection and broken teeth) until thrush and general health are better

## 2024-02-23 NOTE — Assessment & Plan Note (Signed)
 With fatty liver Lab today

## 2024-02-23 NOTE — Assessment & Plan Note (Signed)
 Acute on chronic in pt with chronic pain and fatigue and also chronic thrush and mod to severe dental disease  Unsure what flared this episode (husband had GI upset for several days as well) Urinalysis clear  Keeps chronic low grade temp and tachycardia  Reviewed last GI notes from feb  Has zofran  and pheneran for prn use   Lab today  Reassuring exam  Gi follow up planned for aug

## 2024-02-23 NOTE — Assessment & Plan Note (Signed)
 B12 added to labs

## 2024-02-23 NOTE — Assessment & Plan Note (Signed)
 May be due to low grade temp in setting of dental decay

## 2024-02-23 NOTE — Telephone Encounter (Signed)
 Received a call from Mariah Shines at [facility]. Took a call report on pt's critical Hgb of 7.4.

## 2024-02-23 NOTE — Assessment & Plan Note (Signed)
 Lab today.

## 2024-02-24 NOTE — Telephone Encounter (Signed)
Addressed through result notes  

## 2024-02-25 ENCOUNTER — Telehealth: Payer: Self-pay | Admitting: Oncology

## 2024-02-25 ENCOUNTER — Telehealth: Payer: Self-pay

## 2024-02-25 NOTE — Telephone Encounter (Signed)
-----   Message from Timmy Forbes sent at 02/25/2024 10:49 AM EDT ----- Dr. Malissa Se,  Thanks for reaching out. Let us  know if pt agrees with follow up with us . She lost follow up. Previously we reached out to her and left messages and she did not call back.   She can also give us  a call and schedule a follow up appt with me for MD + venofer .  Cc my team RN Nellie Banas.   Thank you   Zhou ----- Message ----- From: Clemens Curt, MD Sent: 02/23/2024   7:59 PM EDT To: Timmy Forbes, MD; [facility]  Your anemia is significantly worse with hb of 7.4  We need to get you back to hematology to discuss iron  infusions  Let me know if it ok to refer you and I will cc Dr Wilhelmenia Harada This may be why you feel so bad  B12 level is high -you can half the dose you are taking  Iiver tests are ok  Other labs stable

## 2024-02-25 NOTE — Telephone Encounter (Signed)
 Dr. Belva Boyden MA has left VM for pt and sent mychart message to call our office and set up follow up for anemia. When pt calls she will need to be scheduled for:   MD/ Venofer 

## 2024-02-25 NOTE — Telephone Encounter (Signed)
 Pt called back to schedule her appts. (Lab prior to MD/venofer )  Pt stated she just had labs done at pcp and they should have been sent here.   I scheduled a lab prior but told pt I can cancel lab if MD states that is okay.  Pt was agreeable to this

## 2024-02-28 ENCOUNTER — Ambulatory Visit: Admitting: Family Medicine

## 2024-03-02 ENCOUNTER — Other Ambulatory Visit

## 2024-03-03 ENCOUNTER — Inpatient Hospital Stay

## 2024-03-03 ENCOUNTER — Encounter: Payer: Self-pay | Admitting: Oncology

## 2024-03-03 ENCOUNTER — Inpatient Hospital Stay: Attending: Oncology | Admitting: Oncology

## 2024-03-03 VITALS — BP 133/72 | HR 122 | Temp 98.6°F | Resp 18 | Wt 162.0 lb

## 2024-03-03 VITALS — BP 134/56 | HR 107

## 2024-03-03 DIAGNOSIS — D500 Iron deficiency anemia secondary to blood loss (chronic): Secondary | ICD-10-CM

## 2024-03-03 DIAGNOSIS — D509 Iron deficiency anemia, unspecified: Secondary | ICD-10-CM | POA: Diagnosis present

## 2024-03-03 MED ORDER — SODIUM CHLORIDE 0.9% FLUSH
10.0000 mL | Freq: Once | INTRAVENOUS | Status: AC | PRN
  Administered 2024-03-03: 10 mL
  Filled 2024-03-03: qty 10

## 2024-03-03 MED ORDER — IRON SUCROSE 20 MG/ML IV SOLN
200.0000 mg | Freq: Once | INTRAVENOUS | Status: AC
  Administered 2024-03-03: 200 mg via INTRAVENOUS

## 2024-03-03 NOTE — Progress Notes (Signed)
 Hematology/Oncology Progress note [phone] [phone]         Patient Care Team: Tower, Manley Seeds, MD as PCP - General (Family Medicine) Timmy Forbes, MD as Consulting Physician (Hematology and Oncology)   REFERRING PROVIDER: Tower, Manley Seeds, MD  CHIEF COMPLAINTS/REASON FOR VISIT:  Anemia  ASSESSMENT & PLAN:  IDA (iron  deficiency anemia) Labs are reviewed and discussed with patient. Lab Results  Component Value Date   HGB 7.4 Repeated and verified X2. (LL) 02/23/2024   TIBC 462 (H) 09/01/2022   IRONPCTSAT 8 (LL) 09/01/2022   FERRITIN 2.6 (L) 02/23/2024     Recommend IV Venofer  weekly x 4. Recommend patient to follow up with GI for evaluation of etiology of IDA  Orders Placed This Encounter  Procedures   CBC with Differential (Cancer Center Only)    Standing Status:   Future    Expected Date:   05/03/2024    Expiration Date:   03/03/2025   Iron  and TIBC    Standing Status:   Future    Expected Date:   05/03/2024    Expiration Date:   03/03/2025   Ferritin    Standing Status:   Future    Expected Date:   05/03/2024    Expiration Date:   03/03/2025   Retic Panel    Standing Status:   Future    Expected Date:   05/03/2024    Expiration Date:   03/03/2025   Follow up in 2 months.  All questions were answered. The patient knows to call the clinic with any problems, questions or concerns.  Timmy Forbes, MD, PhD [facility] Hematology Oncology 03/03/2024     HISTORY OF PRESENTING ILLNESS:  [name] Branch is a  68 y.o.  female with PMH listed below who was referred to me for anemia Reviewed patient's recent labs that was done.  She was found to have ianemia during pre surgery evaluation. 09/01/22 cbc showed Hb of 10.3, ferritin 11, iron  saturation 8, TIBC 462.  Reviewed patient's previous labs ordered by primary care physician's office,iron  deficiency anemia was new.  + fatigue.  She denies recent chest pain on exertion, shortness of breath on minimal exertion,  pre-syncopal episodes, or palpitations She had not noticed any recent bleeding such as epistaxis, hematuria or hematochezia.   Her last colonoscopy was 01/22/22, EGD was 12/02/21. She was found to have gastritis.     INTERVAL HISTORY [name] Branch is a 68 y.o. female who has above history reviewed by me today presents for follow up visit for iron  deficiency anemia. Patient was last seen by me on 09/08/2022.  Lost follow-up.  She presented to reestablish care. Recent blood work showed severe anemia with iron  deficiency. Patient denies black stool or blood in the stool. She feels very tired and fatigued. Patient had bladder prolapse surgery in December 2023.   MEDICAL HISTORY:  Past Medical History:  Diagnosis Date   Allergic rhinitis    Anemia    Arthritis    Chronic fatigue syndrome    CFIDs; also chronic joint and mucle pain (possible tik bourne illness?). Dr. Althea Jesus    Chronic gastritis    Dyspnea    GERD (gastroesophageal reflux disease)    Hepatic steatosis    History of kidney stones 05/2017   Hyperlipidemia    IBS (irritable bowel syndrome)    Lyme disease    "stari"   Migraine    Muscle twitching    Neuromuscular disorder (HCC)  Pneumonia    PONV (postoperative nausea and vomiting)    Pre-diabetes    Rectal prolapse    Schatzki's ring    Umbilical hernia    Vaginal prolapse    West Nile Virus infection 2003    SURGICAL HISTORY: Past Surgical History:  Procedure Laterality Date   5th R toe fx  '09   ABDOMINAL HYSTERECTOMY     BREAST BIOPSY  '04   neg; right   CHOLECYSTECTOMY  10/2011   COLONOSCOPY  12/02/2021   2017- 2- TAs   CYSTOSCOPY WITH STENT PLACEMENT Right 05/25/2017   Procedure: CYSTOSCOPY WITH STENT PLACEMENT;  Surgeon: Dustin Gimenez, MD;  Location: [facility] ORS;  Service: Urology;  Laterality: Right;   CYSTOSCOPY WITH STENT PLACEMENT Right 06/21/2017   Procedure: CYSTOSCOPY WITH STENT EXCHANGE;  Surgeon: Dustin Gimenez, MD;  Location: [facility]  ORS;  Service: Urology;  Laterality: Right;   DIAGNOSTIC LAPAROSCOPY     EXTRACORPOREAL SHOCK WAVE LITHOTRIPSY Right 06/10/2017   Procedure: EXTRACORPOREAL SHOCK WAVE LITHOTRIPSY (ESWL);  Surgeon: Dustin Gimenez, MD;  Location: [facility] ORS;  Service: Urology;  Laterality: Right;   HOLMIUM LASER APPLICATION Right 06/21/2017   Procedure: HOLMIUM LASER APPLICATION;  Surgeon: Dustin Gimenez, MD;  Location: [facility] ORS;  Service: Urology;  Laterality: Right;   laprascopy     PARTIAL HYSTERECTOMY     bleeding   ROBOTIC ASSISTED LAPAROSCOPIC SACROCOLPOPEXY N/A 09/25/2022   Procedure: XI ROBOTIC ASSISTED LAPAROSCOPIC SACROCOLPOPEXY; BILATERAL SALPINGOOPHERECTOMY;  Surgeon: Andrez Banker, MD;  Location: [facility];  Service: Urology;  Laterality: N/A;  240 MINUTES NEEDED FOR CASE   TONSILLECTOMY     TONSILLECTOMY     ULNAR NERVE REPAIR     sx times 5   UPPER GASTROINTESTINAL ENDOSCOPY  12/04/2004   gastritis   URETEROSCOPY WITH HOLMIUM LASER LITHOTRIPSY Right 06/21/2017   Procedure: URETEROSCOPY WITH HOLMIUM LASER LITHOTRIPSY;  Surgeon: Dustin Gimenez, MD;  Location: [facility] ORS;  Service: Urology;  Laterality: Right;    SOCIAL HISTORY: Social History   Socioeconomic History   Marital status: Married    Spouse name: Not on file   Number of children: 0   Years of education: Not on file   Highest education level: Bachelor's degree (e.g., BA, AB, BS)  Occupational History   Occupation: owner  Tobacco Use   Smoking status: Never   Smokeless tobacco: Never   Tobacco comments:    non smoker  Vaping Use   Vaping status: Never Used  Substance and Sexual Activity   Alcohol use: No    Alcohol/week: 0.0 standard drinks of alcohol   Drug use: Never   Sexual activity: Not Currently  Other Topics Concern   Not on file  Social History Narrative   Runs a wildlife center. Married, no children.    Social Drivers of Corporate investment banker Strain: Low Risk  (11/10/2023)   Overall Financial  Resource Strain (CARDIA)    Difficulty of Paying Living Expenses: Not hard at all  Food Insecurity: No Food Insecurity (11/10/2023)   Hunger Vital Sign    Worried About Running Out of Food in the Last Year: Never true    Ran Out of Food in the Last Year: Never true  Transportation Needs: No Transportation Needs (11/10/2023)   PRAPARE - Administrator, Civil Service (Medical): No    Lack of Transportation (Non-Medical): No  Physical Activity: Inactive (11/10/2023)   Exercise Vital Sign    Days of Exercise per Week: 0 days  Minutes of Exercise per Session: 0 min  Stress: Patient Declined (11/10/2023)   [employer] of Occupational Health - Occupational Stress Questionnaire    Feeling of Stress : Patient declined  Social Connections: Unknown (11/10/2023)   Social Connection and Isolation Panel [NHANES]    Frequency of Communication with Friends and Family: Patient declined    Frequency of Social Gatherings with Friends and Family: Patient declined    Attends Religious Services: Never    Database administrator or Organizations: No    Attends Banker Meetings: Never    Marital Status: Married  Catering manager Violence: Not At Risk (06/02/2023)   Humiliation, Afraid, Rape, and Kick questionnaire    Fear of Current or Ex-Partner: No    Emotionally Abused: No    Physically Abused: No    Sexually Abused: No    FAMILY HISTORY: Family History  Problem Relation Age of Onset   Hypertension Mother    Cancer Mother        Waldenstroms   Ulcers Mother    Diabetes Father    Hypertension Father    Stroke Father    Cancer Father    Throat cancer Father    Bladder Cancer Father    Breast cancer Sister    Alcohol abuse Maternal Grandfather    Breast cancer Paternal Grandmother    Lung cancer Paternal Grandfather    Bipolar disorder Other        some family   Colon cancer Neg Hx    Esophageal cancer Neg Hx    Pancreatic cancer Neg Hx    Ulcerative colitis Neg  Hx     ALLERGIES:  is allergic to other and sulfonamide derivatives.  MEDICATIONS:  Current Outpatient Medications  Medication Sig Dispense Refill   Ascorbic Acid (VITAMIN C GUMMIE PO) Take 1 capsule by mouth daily.     Cholecalciferol  (VITAMIN D ) 125 MCG (5000 UT) CAPS Take 5,000 Units by mouth daily.     Cranberry-Vitamin C-Probiotic (AZO CRANBERRY) 250-30 MG TABS Take 1 capsule by mouth daily as needed.     docusate sodium  (COLACE) 100 MG capsule Take 1 capsule (100 mg total) by mouth 2 (two) times daily. 60 capsule 0   fluconazole  (DIFLUCAN ) 150 MG tablet Take 150 mg by mouth once.     Garlic 1000 MG CAPS Take 1 capsule by mouth daily.     HM MAGNESIUM  CITRATE PO Take 2 tablets by mouth daily.     Iron  18 MG/15ML LIQD FLORADIX LIQUID NATURAL IRON      Multiple Vitamin (MULTIVITAMIN) tablet Take 1 tablet by mouth daily.     Multiple Vitamins-Minerals (HAIR SKIN AND NAILS FORMULA PO) Take 1 tablet by mouth daily.     nystatin  (MYCOSTATIN ) 100000 UNIT/ML suspension Take 5 mLs (500,000 Units total) by mouth in the morning, at noon, and at bedtime. 120 mL 1   ondansetron  (ZOFRAN -ODT) 8 MG disintegrating tablet Take 1 tablet (8 mg total) by mouth every 8 (eight) hours as needed for nausea, vomiting or refractory nausea / vomiting. Caution of sedation 60 tablet 1   pantoprazole  (PROTONIX ) 40 MG tablet Take 1 tablet (40 mg total) by mouth daily. 30 tablet 4   polyethylene glycol (MIRALAX  / GLYCOLAX ) 17 g packet Take 17 g by mouth daily.     promethazine  (PHENERGAN ) 25 MG tablet Take 1 tablet (25 mg total) by mouth every 8 (eight) hours as needed. 45 tablet 11   vitamin B-12 (CYANOCOBALAMIN ) 1000 MCG tablet Place  1,000 mcg under the tongue daily.     vitamin E  180 MG (400 UNITS) capsule Take 2 capsules (800 Units total) by mouth daily. 180 capsule 3   cephALEXin  (KEFLEX ) 500 MG capsule Take 1 capsule (500 mg total) by mouth 2 (two) times daily. (Patient not taking: Reported on 03/03/2024) 14 capsule  0   No current facility-administered medications for this visit.    Review of Systems  Constitutional:  Positive for fatigue. Negative for appetite change, chills and fever.  HENT:   Negative for hearing loss and voice change.   Eyes:  Negative for eye problems.  Respiratory:  Negative for chest tightness and cough.   Cardiovascular:  Negative for chest pain.  Gastrointestinal:  Positive for nausea. Negative for abdominal distention, abdominal pain and blood in stool.  Endocrine: Negative for hot flashes.  Genitourinary:  Negative for difficulty urinating and frequency.   Musculoskeletal:  Negative for arthralgias.  Skin:  Negative for itching and rash.  Neurological:  Negative for extremity weakness.  Hematological:  Negative for adenopathy.  Psychiatric/Behavioral:  Negative for confusion.     PHYSICAL EXAMINATION: Vitals:   03/03/24 1104  BP: 133/72  Pulse: (!) 122  Resp: 18  Temp: 98.6 F (37 C)  SpO2: 98%   Filed Weights   03/03/24 1104  Weight: 162 lb (73.5 kg)    Physical Exam Constitutional:      General: She is not in acute distress.    Appearance: She is obese.  HENT:     Head: Normocephalic and atraumatic.  Eyes:     General: No scleral icterus. Cardiovascular:     Rate and Rhythm: Normal rate.  Pulmonary:     Effort: Pulmonary effort is normal. No respiratory distress.     Breath sounds: No wheezing.  Abdominal:     Palpations: Abdomen is soft.  Musculoskeletal:        General: Normal range of motion.     Cervical back: Normal range of motion and neck supple.  Skin:    General: Skin is warm.     Findings: No erythema or rash.  Neurological:     Mental Status: She is alert and oriented to person, place, and time. Mental status is at baseline.  Psychiatric:        Mood and Affect: Mood normal.      LABORATORY DATA:  I have reviewed the data as listed    Latest Ref Rng & Units 02/23/2024    1:07 PM 06/09/2023    3:18 PM 04/26/2023    2:23 AM   CBC  WBC 4.0 - 10.5 K/uL 5.8  4.0  3.8   Hemoglobin 12.0 - 15.0 g/dL 7.4 Repeated and verified X2.  10.2  10.2   Hematocrit 36.0 - 46.0 % 26.2  32.3  33.0   Platelets 150.0 - 400.0 K/uL 309.0  203.0  168       Latest Ref Rng & Units 02/23/2024    1:07 PM 06/09/2023    3:18 PM 04/26/2023    2:23 AM  CMP  Glucose 70 - 99 mg/dL 97  956  213   BUN 6 - 23 mg/dL 8  11  8    Creatinine 0.40 - 1.20 mg/dL 0.86  5.78  4.69   Sodium 135 - 145 mEq/L 137  137  135   Potassium 3.5 - 5.1 mEq/L 3.9  3.6  3.5   Chloride 96 - 112 mEq/L 101  103  103   CO2  19 - 32 mEq/L 27  24  19    Calcium 8.4 - 10.5 mg/dL 9.0  9.2  8.9   Total Protein 6.0 - 8.3 g/dL 7.2  6.5  7.4   Total Bilirubin 0.2 - 1.2 mg/dL 1.2  1.5  3.0   Alkaline Phos 39 - 117 U/L 133  101  88   AST 0 - 37 U/L 36  40  47   ALT 0 - 35 U/L 16  20  20        Component Value Date/Time   IRON  23 (L) 02/23/2024 1307   IRON  36 09/01/2022 1608   TIBC 462 (H) 09/01/2022 1608   FERRITIN 2.6 (L) 02/23/2024 1307   FERRITIN 11 (L) 09/01/2022 1608   IRONPCTSAT 8 (LL) 09/01/2022 1608     RADIOGRAPHIC STUDIES: I have personally reviewed the radiological images as listed and agreed with the findings in the report. No results found.

## 2024-03-03 NOTE — Assessment & Plan Note (Addendum)
 Labs are reviewed and discussed with patient. Lab Results  Component Value Date   HGB 7.4 Repeated and verified X2. (LL) 02/23/2024   TIBC 462 (H) 09/01/2022   IRONPCTSAT 8 (LL) 09/01/2022   FERRITIN 2.6 (L) 02/23/2024     Recommend IV Venofer  weekly x 4. Recommend patient to follow up with GI for evaluation of etiology of IDA

## 2024-03-09 ENCOUNTER — Inpatient Hospital Stay

## 2024-03-09 VITALS — BP 146/66 | HR 115 | Temp 99.8°F | Resp 18

## 2024-03-09 DIAGNOSIS — D509 Iron deficiency anemia, unspecified: Secondary | ICD-10-CM | POA: Diagnosis not present

## 2024-03-09 DIAGNOSIS — D500 Iron deficiency anemia secondary to blood loss (chronic): Secondary | ICD-10-CM

## 2024-03-09 MED ORDER — IRON SUCROSE 20 MG/ML IV SOLN
200.0000 mg | Freq: Once | INTRAVENOUS | Status: AC
Start: 2024-03-09 — End: 2024-03-09
  Administered 2024-03-09: 200 mg via INTRAVENOUS
  Filled 2024-03-09: qty 10

## 2024-03-09 NOTE — Patient Instructions (Signed)
Iron Sucrose Injection What is this medication? IRON SUCROSE (EYE ern SOO krose) treats low levels of iron (iron deficiency anemia) in people with kidney disease. Iron is a mineral that plays an important role in making red blood cells, which carry oxygen from your lungs to the rest of your body. This medicine may be used for other purposes; ask your health care provider or pharmacist if you have questions. COMMON BRAND NAME(S): Venofer What should I tell my care team before I take this medication? They need to know if you have any of these conditions: Anemia not caused by low iron levels Heart disease High levels of iron in the blood Kidney disease Liver disease An unusual or allergic reaction to iron, other medications, foods, dyes, or preservatives Pregnant or trying to get pregnant Breastfeeding How should I use this medication? This medication is for infusion into a vein. It is given in a hospital or clinic setting. Talk to your care team about the use of this medication in children. While this medication may be prescribed for children as young as 2 years for selected conditions, precautions do apply. Overdosage: If you think you have taken too much of this medicine contact a poison control center or emergency room at once. NOTE: This medicine is only for you. Do not share this medicine with others. What if I miss a dose? Keep appointments for follow-up doses. It is important not to miss your dose. Call your care team if you are unable to keep an appointment. What may interact with this medication? Do not take this medication with any of the following: Deferoxamine Dimercaprol Other iron products This medication may also interact with the following: Chloramphenicol Deferasirox This list may not describe all possible interactions. Give your health care provider a list of all the medicines, herbs, non-prescription drugs, or dietary supplements you use. Also tell them if you smoke,  drink alcohol, or use illegal drugs. Some items may interact with your medicine. What should I watch for while using this medication? Visit your care team regularly. Tell your care team if your symptoms do not start to get better or if they get worse. You may need blood work done while you are taking this medication. You may need to follow a special diet. Talk to your care team. Foods that contain iron include: whole grains/cereals, dried fruits, beans, or peas, leafy green vegetables, and organ meats (liver, kidney). What side effects may I notice from receiving this medication? Side effects that you should report to your care team as soon as possible: Allergic reactions--skin rash, itching, hives, swelling of the face, lips, tongue, or throat Low blood pressure--dizziness, feeling faint or lightheaded, blurry vision Shortness of breath Side effects that usually do not require medical attention (report to your care team if they continue or are bothersome): Flushing Headache Joint pain Muscle pain Nausea Pain, redness, or irritation at injection site This list may not describe all possible side effects. Call your doctor for medical advice about side effects. You may report side effects to FDA at 1-800-FDA-1088. Where should I keep my medication? This medication is given in a hospital or clinic and will not be stored at home. NOTE: This sheet is a summary. It may not cover all possible information. If you have questions about this medicine, talk to your doctor, pharmacist, or health care provider.  2023 Elsevier/Gold Standard (2021-01-23 00:00:00)  

## 2024-03-16 ENCOUNTER — Inpatient Hospital Stay

## 2024-03-16 VITALS — BP 127/68 | HR 115 | Temp 99.1°F | Resp 18

## 2024-03-16 DIAGNOSIS — D509 Iron deficiency anemia, unspecified: Secondary | ICD-10-CM | POA: Diagnosis not present

## 2024-03-16 DIAGNOSIS — D500 Iron deficiency anemia secondary to blood loss (chronic): Secondary | ICD-10-CM

## 2024-03-16 MED ORDER — IRON SUCROSE 20 MG/ML IV SOLN
200.0000 mg | Freq: Once | INTRAVENOUS | Status: AC
  Administered 2024-03-16: 200 mg via INTRAVENOUS
  Filled 2024-03-16: qty 10

## 2024-03-16 NOTE — Progress Notes (Signed)
 Pt here for venofer . Pt states she has "felt out of it 2 days after the first dose and then again after one of the other doses." Denies any other symptoms. MD aware.Ok to proceed with treatment

## 2024-03-23 ENCOUNTER — Inpatient Hospital Stay

## 2024-03-23 VITALS — BP 131/61 | HR 102 | Temp 97.2°F | Resp 18

## 2024-03-23 DIAGNOSIS — D500 Iron deficiency anemia secondary to blood loss (chronic): Secondary | ICD-10-CM

## 2024-03-23 DIAGNOSIS — D509 Iron deficiency anemia, unspecified: Secondary | ICD-10-CM | POA: Diagnosis not present

## 2024-03-23 MED ORDER — IRON SUCROSE 20 MG/ML IV SOLN
200.0000 mg | Freq: Once | INTRAVENOUS | Status: AC
  Administered 2024-03-23: 200 mg via INTRAVENOUS

## 2024-03-25 ENCOUNTER — Other Ambulatory Visit: Payer: Self-pay | Admitting: Family Medicine

## 2024-03-27 NOTE — Telephone Encounter (Signed)
 Last filled on 01/24/24 #60 tabs/ 1 refill   Last OV was an acute appt on 02/23/24

## 2024-04-03 ENCOUNTER — Telehealth: Payer: Self-pay | Admitting: Gastroenterology

## 2024-04-03 NOTE — Telephone Encounter (Signed)
 Requesting to speak with a nurse in regards to iron . Please advise.   Thank you

## 2024-04-04 NOTE — Telephone Encounter (Signed)
 Left message for pt to call back 04/04/24

## 2024-04-06 NOTE — Telephone Encounter (Signed)
 Left message for patient to call back

## 2024-04-06 NOTE — Telephone Encounter (Signed)
 Patient states that Dr Wilhelmenia Harada told her to call our office because her hemoglobin was dangerously low. She is unsure exactly what was needed; nothing written in Dr Jackqueline Mason office note about GI follow up that I see. I confirmed that her last bloodwork was completed 02/23/24 and she states she has not had any additional labwork since that time. Following the 02/23/24 labs, she has had several Venofer  infusions and has additional infusion scheduled. She has upcoming follow up appointment with Dr Wilhelmenia Harada on 05/05/24.  Per GI last office notes, patient is to follow with GI in 6 months from her 11/2023 appointment.   I have placed patient on to see Brigitte Canard, PA-C 06/08/24 at 3 pm for 6 month follow up and advised patient to continue with iron  infusions as scheduled.  Dr Wilhelmenia Harada- Is there anything additional needed from GI right now?

## 2024-04-07 ENCOUNTER — Telehealth: Payer: Self-pay | Admitting: *Deleted

## 2024-04-07 ENCOUNTER — Encounter: Payer: Self-pay | Admitting: Family Medicine

## 2024-04-07 NOTE — Telephone Encounter (Signed)
 May need to be seen  Please triage -unsure what symptoms are  I am currently out of town and will likely be out of touch for the rest of the day thanks

## 2024-04-07 NOTE — Telephone Encounter (Signed)
 Pt sent a message saying:  Dr. Malissa Se, hope you are well. Will update you on how I'm doing with the iron  infusions when I have some information to give. Waiting to see Dr. Wilhelmenia Harada as a follow up. I have had a number of transfusions & due to see her again to learn if there's been any positive change in my numbers. That follow up is a month away.  She has additional transfusions scheduled at that time.   Am writing as it feels like I have a yeast infection in my anal area & unsure if I can use an over the counter product or need a prescription.  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

## 2024-04-07 NOTE — Telephone Encounter (Signed)
 Sent note to triage pool to be triaged per Dr. Malissa Se

## 2024-04-07 NOTE — Telephone Encounter (Signed)
 I spoke with [name] Branch; [name] Branch said that she has itching and an achy feeling in rectal and perineal area. [name] Branch has rectal prolapse. No available appts at [facility] or other [facility]  offices today., I offered to schedule appt at [facility] for [name] Branch today but [name] Branch declined and said would use OTC med if that did not help [name] Branch would cb on 04/10/24. UC & ED precautions given and [name] Branch voiced understanding. Dr Malissa Se is out of office and per note below Dr Malissa Se may not have access to desktop today. Sending note to Dr Vallarie Gauze who is in office as FYI and [employer].,

## 2024-04-07 NOTE — Telephone Encounter (Signed)
 Agree. Thanks

## 2024-04-18 ENCOUNTER — Encounter: Payer: Self-pay | Admitting: Family Medicine

## 2024-04-18 ENCOUNTER — Telehealth: Payer: Self-pay | Admitting: Family Medicine

## 2024-04-18 NOTE — Telephone Encounter (Signed)
 Per my chart message  Please call her to drop off a clean catch sample  Thanks     Here we go again (Newest Message First)              04/18/24  7:55 AM Sebastian Danna GRADE, CMA routed this conversation to Me (Selected Message) Lonell Laurier Molly to [facility] (supporting You)     04/18/24  5:04 AM Dr. Randeen, I may have another UTI.  The last time I was wrong & delighted to be but this time took 3 different types of home tests & all were positive.  Can Peter drop off a sample as he's done before? We still have instructions & materials to do so. The plan would be for him bring them tomorrow but we have two dogs & both are dying, albeit from different issues. Maude is exhausted, getting two hours sleep in a chair each night & has not stopped trying everything to make them better. One goes back to the vet tomorrow where a decision will be made.  It could potentially be a permanent, heartbreaking one.

## 2024-04-19 ENCOUNTER — Other Ambulatory Visit: Payer: Self-pay | Admitting: *Deleted

## 2024-04-19 DIAGNOSIS — R300 Dysuria: Secondary | ICD-10-CM

## 2024-04-19 DIAGNOSIS — R350 Frequency of micturition: Secondary | ICD-10-CM

## 2024-04-19 NOTE — Telephone Encounter (Signed)
 Sent mychart letting pt know she can drop off urine sample

## 2024-04-20 ENCOUNTER — Other Ambulatory Visit (INDEPENDENT_AMBULATORY_CARE_PROVIDER_SITE_OTHER)

## 2024-04-20 ENCOUNTER — Ambulatory Visit: Payer: Self-pay | Admitting: Family Medicine

## 2024-04-20 DIAGNOSIS — R300 Dysuria: Secondary | ICD-10-CM | POA: Diagnosis not present

## 2024-04-20 DIAGNOSIS — R8290 Unspecified abnormal findings in urine: Secondary | ICD-10-CM

## 2024-04-20 DIAGNOSIS — R350 Frequency of micturition: Secondary | ICD-10-CM | POA: Diagnosis not present

## 2024-04-20 LAB — POC URINALSYSI DIPSTICK (AUTOMATED)
Bilirubin, UA: NEGATIVE
Blood, UA: 50
Glucose, UA: NEGATIVE
Ketones, UA: NEGATIVE
Nitrite, UA: POSITIVE
Protein, UA: POSITIVE — AB
Spec Grav, UA: 1.015 (ref 1.010–1.025)
Urobilinogen, UA: 0.2 U/dL
pH, UA: 6 (ref 5.0–8.0)

## 2024-04-20 MED ORDER — CEPHALEXIN 500 MG PO CAPS
500.0000 mg | ORAL_CAPSULE | Freq: Two times a day (BID) | ORAL | 0 refills | Status: DC
Start: 2024-04-20 — End: 2024-05-19

## 2024-04-22 LAB — URINE CULTURE
MICRO NUMBER:: 16629237
SPECIMEN QUALITY:: ADEQUATE

## 2024-05-03 ENCOUNTER — Inpatient Hospital Stay: Attending: Oncology

## 2024-05-03 DIAGNOSIS — D509 Iron deficiency anemia, unspecified: Secondary | ICD-10-CM | POA: Diagnosis present

## 2024-05-03 DIAGNOSIS — Z79899 Other long term (current) drug therapy: Secondary | ICD-10-CM | POA: Diagnosis not present

## 2024-05-03 DIAGNOSIS — D500 Iron deficiency anemia secondary to blood loss (chronic): Secondary | ICD-10-CM

## 2024-05-03 LAB — RETIC PANEL
Immature Retic Fract: 8.9 % (ref 2.3–15.9)
RBC.: 5.36 MIL/uL — ABNORMAL HIGH (ref 3.87–5.11)
Retic Count, Absolute: 32.7 10*3/uL (ref 19.0–186.0)
Retic Ct Pct: 0.6 % (ref 0.4–3.1)
Reticulocyte Hemoglobin: 28.9 pg

## 2024-05-03 LAB — CBC WITH DIFFERENTIAL (CANCER CENTER ONLY)
Abs Immature Granulocytes: 0.02 K/uL (ref 0.00–0.07)
Basophils Absolute: 0.1 K/uL (ref 0.0–0.1)
Basophils Relative: 2 %
Eosinophils Absolute: 0.1 K/uL (ref 0.0–0.5)
Eosinophils Relative: 3 %
HCT: 39.3 % (ref 36.0–46.0)
Hemoglobin: 11.9 g/dL — ABNORMAL LOW (ref 12.0–15.0)
Immature Granulocytes: 1 %
Lymphocytes Relative: 46 %
Lymphs Abs: 2.1 K/uL (ref 0.7–4.0)
MCH: 22.3 pg — ABNORMAL LOW (ref 26.0–34.0)
MCHC: 30.3 g/dL (ref 30.0–36.0)
MCV: 73.7 fL — ABNORMAL LOW (ref 80.0–100.0)
Monocytes Absolute: 0.5 K/uL (ref 0.1–1.0)
Monocytes Relative: 10 %
Neutro Abs: 1.7 K/uL (ref 1.7–7.7)
Neutrophils Relative %: 38 %
Platelet Count: 179 K/uL (ref 150–400)
RBC: 5.33 MIL/uL — ABNORMAL HIGH (ref 3.87–5.11)
RDW: 26.8 % — ABNORMAL HIGH (ref 11.5–15.5)
WBC Count: 4.4 K/uL (ref 4.0–10.5)
nRBC: 0 % (ref 0.0–0.2)

## 2024-05-03 LAB — IRON AND TIBC
Iron: 58 ug/dL (ref 28–170)
Saturation Ratios: 15 % (ref 10.4–31.8)
TIBC: 393 ug/dL (ref 250–450)
UIBC: 335 ug/dL

## 2024-05-03 LAB — FERRITIN: Ferritin: 20 ng/mL (ref 11–307)

## 2024-05-05 ENCOUNTER — Inpatient Hospital Stay

## 2024-05-05 ENCOUNTER — Encounter: Payer: Self-pay | Admitting: Oncology

## 2024-05-05 ENCOUNTER — Inpatient Hospital Stay (HOSPITAL_BASED_OUTPATIENT_CLINIC_OR_DEPARTMENT_OTHER): Admitting: Oncology

## 2024-05-05 VITALS — BP 141/63 | HR 105 | Temp 98.6°F | Resp 18

## 2024-05-05 DIAGNOSIS — D500 Iron deficiency anemia secondary to blood loss (chronic): Secondary | ICD-10-CM

## 2024-05-05 DIAGNOSIS — D509 Iron deficiency anemia, unspecified: Secondary | ICD-10-CM | POA: Diagnosis not present

## 2024-05-05 MED ORDER — IRON SUCROSE 20 MG/ML IV SOLN
200.0000 mg | Freq: Once | INTRAVENOUS | Status: AC
  Administered 2024-05-05: 200 mg via INTRAVENOUS

## 2024-05-05 NOTE — Assessment & Plan Note (Addendum)
 Labs are reviewed and discussed with patient. Lab Results  Component Value Date   HGB 11.9 (L) 05/03/2024   TIBC 393 05/03/2024   IRONPCTSAT 15 05/03/2024   FERRITIN 20 05/03/2024    Tolerated IV venofer  well.  Hemoglobin and Iron  panel have improved.  Recommend Venfoer 200mg  x 1 to further increase iron  store.  Recommend patient to follow up with GI for evaluation of etiology of IDA

## 2024-05-05 NOTE — Patient Instructions (Signed)

## 2024-05-05 NOTE — Progress Notes (Signed)
 Declined post-observation. Aware of risks. Vitals stable at discharge.

## 2024-05-05 NOTE — Progress Notes (Signed)
 Hematology/Oncology Progress note [phone] [phone]         Patient Care Team: Tower, Laine LABOR, MD as PCP - General (Family Medicine) Babara Call, MD as Consulting Physician (Hematology and Oncology)   REFERRING PROVIDER: Tower, Laine LABOR, MD  CHIEF COMPLAINTS/REASON FOR VISIT:  Anemia  ASSESSMENT & PLAN:  IDA (iron  deficiency anemia) Labs are reviewed and discussed with patient. Lab Results  Component Value Date   HGB 11.9 (L) 05/03/2024   TIBC 393 05/03/2024   IRONPCTSAT 15 05/03/2024   FERRITIN 20 05/03/2024    Tolerated IV venofer  well.  Hemoglobin and Iron  panel have improved.  Recommend Venfoer 200mg  x 1 to further increase iron  store.  Recommend patient to follow up with GI for evaluation of etiology of IDA  Orders Placed This Encounter  Procedures   CBC with Differential (Cancer Center Only)    Standing Status:   Future    Expected Date:   08/05/2024    Expiration Date:   11/03/2024   Iron  and TIBC    Standing Status:   Future    Expected Date:   08/05/2024    Expiration Date:   11/03/2024   Ferritin    Standing Status:   Future    Expected Date:   08/05/2024    Expiration Date:   11/03/2024   Retic Panel    Standing Status:   Future    Expected Date:   08/05/2024    Expiration Date:   11/03/2024   Follow up in 3 months.  All questions were answered. The patient knows to call the clinic with any problems, questions or concerns.  Call Babara, MD, PhD [facility] Hematology Oncology 05/05/2024     HISTORY OF PRESENTING ILLNESS:  [name] Branch is a  68 y.o.  female with PMH listed below who was referred to me for anemia Reviewed patient's recent labs that was done.  She was found to have ianemia during pre surgery evaluation. 09/01/22 cbc showed Hb of 10.3, ferritin 11, iron  saturation 8, TIBC 462.  Reviewed patient's previous labs ordered by primary care physician's office,iron  deficiency anemia was new.  + fatigue.  She denies  recent chest pain on exertion, shortness of breath on minimal exertion, pre-syncopal episodes, or palpitations She had not noticed any recent bleeding such as epistaxis, hematuria or hematochezia.   Her last colonoscopy was 01/22/22, EGD was 12/02/21. She was found to have gastritis.   Patient had bladder prolapse surgery in December 2023.  INTERVAL HISTORY [name] Branch is a 68 y.o. female who has above history reviewed by me today presents for follow up visit for iron  deficiency anemia. Fatigue has improved.     MEDICAL HISTORY:  Past Medical History:  Diagnosis Date   Allergic rhinitis    Anemia    Arthritis    Chronic fatigue syndrome    CFIDs; also chronic joint and mucle pain (possible tik bourne illness?). Dr. Mac    Chronic gastritis    Dyspnea    GERD (gastroesophageal reflux disease)    Hepatic steatosis    History of kidney stones 05/2017   Hyperlipidemia    IBS (irritable bowel syndrome)    Lyme disease    stari   Migraine    Muscle twitching    Neuromuscular disorder (HCC)    Pneumonia    PONV (postoperative nausea and vomiting)    Pre-diabetes    Rectal prolapse    Schatzki's ring    Umbilical hernia  Vaginal prolapse    West Nile Virus infection 2003    SURGICAL HISTORY: Past Surgical History:  Procedure Laterality Date   5th R toe fx  '09   ABDOMINAL HYSTERECTOMY     BREAST BIOPSY  '04   neg; right   CHOLECYSTECTOMY  10/2011   COLONOSCOPY  12/02/2021   2017- 2- TAs   CYSTOSCOPY WITH STENT PLACEMENT Right 05/25/2017   Procedure: CYSTOSCOPY WITH STENT PLACEMENT;  Surgeon: Penne Knee, MD;  Location: [facility];  Service: Urology;  Laterality: Right;   CYSTOSCOPY WITH STENT PLACEMENT Right 06/21/2017   Procedure: CYSTOSCOPY WITH STENT EXCHANGE;  Surgeon: Penne Knee, MD;  Location: [facility];  Service: Urology;  Laterality: Right;   DIAGNOSTIC LAPAROSCOPY     EXTRACORPOREAL SHOCK WAVE LITHOTRIPSY Right 06/10/2017   Procedure:  EXTRACORPOREAL SHOCK WAVE LITHOTRIPSY (ESWL);  Surgeon: Penne Knee, MD;  Location: [facility];  Service: Urology;  Laterality: Right;   HOLMIUM LASER APPLICATION Right 06/21/2017   Procedure: HOLMIUM LASER APPLICATION;  Surgeon: Penne Knee, MD;  Location: [facility];  Service: Urology;  Laterality: Right;   laprascopy     PARTIAL HYSTERECTOMY     bleeding   ROBOTIC ASSISTED LAPAROSCOPIC SACROCOLPOPEXY N/A 09/25/2022   Procedure: XI ROBOTIC ASSISTED LAPAROSCOPIC SACROCOLPOPEXY; BILATERAL SALPINGOOPHERECTOMY;  Surgeon: Cam Morene ORN, MD;  Location: [facility];  Service: Urology;  Laterality: N/A;  240 MINUTES NEEDED FOR CASE   TONSILLECTOMY     TONSILLECTOMY     ULNAR NERVE REPAIR     sx times 5   UPPER GASTROINTESTINAL ENDOSCOPY  12/04/2004   gastritis   URETEROSCOPY WITH HOLMIUM LASER LITHOTRIPSY Right 06/21/2017   Procedure: URETEROSCOPY WITH HOLMIUM LASER LITHOTRIPSY;  Surgeon: Penne Knee, MD;  Location: [facility];  Service: Urology;  Laterality: Right;    SOCIAL HISTORY: Social History   Socioeconomic History   Marital status: Married    Spouse name: Not on file   Number of children: 0   Years of education: Not on file   Highest education level: Bachelor's degree (e.g., BA, AB, BS)  Occupational History   Occupation: owner  Tobacco Use   Smoking status: Never   Smokeless tobacco: Never   Tobacco comments:    non smoker  Vaping Use   Vaping status: Never Used  Substance and Sexual Activity   Alcohol use: No    Alcohol/week: 0.0 standard drinks of alcohol   Drug use: Never   Sexual activity: Not Currently  Other Topics Concern   Not on file  Social History Narrative   Runs a wildlife center. Married, no children.    Social Drivers of Corporate investment banker Strain: Low Risk  (11/10/2023)   Overall Financial Resource Strain (CARDIA)    Difficulty of Paying Living Expenses: Not hard at all  Food Insecurity: No Food Insecurity (11/10/2023)   Hunger Vital  Sign    Worried About Running Out of Food in the Last Year: Never true    Ran Out of Food in the Last Year: Never true  Transportation Needs: No Transportation Needs (11/10/2023)   PRAPARE - Administrator, Civil Service (Medical): No    Lack of Transportation (Non-Medical): No  Physical Activity: Inactive (11/10/2023)   Exercise Vital Sign    Days of Exercise per Week: 0 days    Minutes of Exercise per Session: 0 min  Stress: Patient Declined (11/10/2023)   [employer] of Occupational Health - Occupational Stress Questionnaire    Feeling of Stress :  Patient declined  Social Connections: Unknown (11/10/2023)   Social Connection and Isolation Panel    Frequency of Communication with Friends and Family: Patient declined    Frequency of Social Gatherings with Friends and Family: Patient declined    Attends Religious Services: Never    Database administrator or Organizations: No    Attends Banker Meetings: Never    Marital Status: Married  Catering manager Violence: Not At Risk (06/02/2023)   Humiliation, Afraid, Rape, and Kick questionnaire    Fear of Current or Ex-Partner: No    Emotionally Abused: No    Physically Abused: No    Sexually Abused: No    FAMILY HISTORY: Family History  Problem Relation Age of Onset   Hypertension Mother    Cancer Mother        Waldenstroms   Ulcers Mother    Diabetes Father    Hypertension Father    Stroke Father    Cancer Father    Throat cancer Father    Bladder Cancer Father    Breast cancer Sister    Alcohol abuse Maternal Grandfather    Breast cancer Paternal Grandmother    Lung cancer Paternal Grandfather    Bipolar disorder Other        some family   Colon cancer Neg Hx    Esophageal cancer Neg Hx    Pancreatic cancer Neg Hx    Ulcerative colitis Neg Hx     ALLERGIES:  is allergic to other and sulfonamide derivatives.  MEDICATIONS:  Current Outpatient Medications  Medication Sig Dispense Refill    Ascorbic Acid (VITAMIN C GUMMIE PO) Take 1 capsule by mouth daily.     Cholecalciferol  (VITAMIN D ) 125 MCG (5000 UT) CAPS Take 5,000 Units by mouth daily.     Cranberry-Vitamin C-Probiotic (AZO CRANBERRY) 250-30 MG TABS Take 1 capsule by mouth daily as needed.     docusate sodium  (COLACE) 100 MG capsule Take 1 capsule (100 mg total) by mouth 2 (two) times daily. 60 capsule 0   Garlic 1000 MG CAPS Take 1 capsule by mouth daily.     HM MAGNESIUM  CITRATE PO Take 2 tablets by mouth daily.     Iron  18 MG/15ML LIQD FLORADIX LIQUID NATURAL IRON      Multiple Vitamin (MULTIVITAMIN) tablet Take 1 tablet by mouth daily.     Multiple Vitamins-Minerals (HAIR SKIN AND NAILS FORMULA PO) Take 1 tablet by mouth daily.     nystatin  (MYCOSTATIN ) 100000 UNIT/ML suspension Take 5 mLs (500,000 Units total) by mouth in the morning, at noon, and at bedtime. 120 mL 1   ondansetron  (ZOFRAN -ODT) 8 MG disintegrating tablet DISSOLVE ONE TABLET BY MOUTH EVERY 8 HOURS AS NEEDED FOR NAUSEA, VOMITING OR REFRACTORY NAUSEA OR VOMITING. CAUTION OF SEDATION 60 tablet 1   pantoprazole  (PROTONIX ) 40 MG tablet Take 1 tablet (40 mg total) by mouth daily. 30 tablet 4   polyethylene glycol (MIRALAX  / GLYCOLAX ) 17 g packet Take 17 g by mouth daily.     promethazine  (PHENERGAN ) 25 MG tablet Take 1 tablet (25 mg total) by mouth every 8 (eight) hours as needed. 45 tablet 11   vitamin B-12 (CYANOCOBALAMIN ) 1000 MCG tablet Place 1,000 mcg under the tongue daily.     cephALEXin  (KEFLEX ) 500 MG capsule Take 1 capsule (500 mg total) by mouth 2 (two) times daily. (Patient not taking: Reported on 05/05/2024) 14 capsule 0   No current facility-administered medications for this visit.    Review of  Systems  Constitutional:  Negative for appetite change, chills, fatigue and fever.  HENT:   Negative for hearing loss and voice change.   Eyes:  Negative for eye problems.  Respiratory:  Negative for chest tightness and cough.   Cardiovascular:  Negative  for chest pain.  Gastrointestinal:  Positive for nausea. Negative for abdominal distention, abdominal pain and blood in stool.  Endocrine: Negative for hot flashes.  Genitourinary:  Negative for difficulty urinating and frequency.   Musculoskeletal:  Negative for arthralgias.  Skin:  Negative for itching and rash.  Neurological:  Negative for extremity weakness.  Hematological:  Negative for adenopathy.  Psychiatric/Behavioral:  Negative for confusion.     PHYSICAL EXAMINATION: Vitals:   05/05/24 1059  BP: (!) 141/63  Pulse: (!) 105  Resp: 18  Temp: 98.6 F (37 C)  SpO2: 97%   There were no vitals filed for this visit.   Physical Exam Constitutional:      General: She is not in acute distress.    Appearance: She is obese.  HENT:     Head: Normocephalic and atraumatic.  Eyes:     General: No scleral icterus. Cardiovascular:     Rate and Rhythm: Normal rate.  Pulmonary:     Effort: Pulmonary effort is normal. No respiratory distress.     Breath sounds: No wheezing.  Abdominal:     Palpations: Abdomen is soft.  Musculoskeletal:        General: Normal range of motion.     Cervical back: Normal range of motion and neck supple.  Skin:    General: Skin is warm.     Findings: No erythema or rash.  Neurological:     Mental Status: She is alert and oriented to person, place, and time. Mental status is at baseline.  Psychiatric:        Mood and Affect: Mood normal.      LABORATORY DATA:  I have reviewed the data as listed    Latest Ref Rng & Units 05/03/2024   10:32 AM 02/23/2024    1:07 PM 06/09/2023    3:18 PM  CBC  WBC 4.0 - 10.5 K/uL 4.4  5.8  4.0   Hemoglobin 12.0 - 15.0 g/dL 88.0  7.4 Repeated and verified X2.  10.2   Hematocrit 36.0 - 46.0 % 39.3  26.2  32.3   Platelets 150 - 400 K/uL 179  309.0  203.0       Latest Ref Rng & Units 02/23/2024    1:07 PM 06/09/2023    3:18 PM 04/26/2023    2:23 AM  CMP  Glucose 70 - 99 mg/dL 97  897  883   BUN 6 - 23 mg/dL 8   11  8    Creatinine 0.40 - 1.20 mg/dL 9.50  9.48  9.50   Sodium 135 - 145 mEq/L 137  137  135   Potassium 3.5 - 5.1 mEq/L 3.9  3.6  3.5   Chloride 96 - 112 mEq/L 101  103  103   CO2 19 - 32 mEq/L 27  24  19    Calcium 8.4 - 10.5 mg/dL 9.0  9.2  8.9   Total Protein 6.0 - 8.3 g/dL 7.2  6.5  7.4   Total Bilirubin 0.2 - 1.2 mg/dL 1.2  1.5  3.0   Alkaline Phos 39 - 117 U/L 133  101  88   AST 0 - 37 U/L 36  40  47   ALT 0 - 35  U/L 16  20  20        Component Value Date/Time   IRON  58 05/03/2024 1032   IRON  36 09/01/2022 1608   TIBC 393 05/03/2024 1032   TIBC 462 (H) 09/01/2022 1608   FERRITIN 20 05/03/2024 1032   FERRITIN 11 (L) 09/01/2022 1608   IRONPCTSAT 15 05/03/2024 1032   IRONPCTSAT 8 (LL) 09/01/2022 1608     RADIOGRAPHIC STUDIES: I have personally reviewed the radiological images as listed and agreed with the findings in the report. No results found.

## 2024-05-11 ENCOUNTER — Other Ambulatory Visit: Payer: Self-pay | Admitting: Family Medicine

## 2024-05-11 NOTE — Telephone Encounter (Signed)
 Last filled on 03/27/24 # 60 tab/ 1 refill,  CPE scheduled 06/20/24

## 2024-05-15 ENCOUNTER — Ambulatory Visit: Admitting: Physician Assistant

## 2024-05-16 ENCOUNTER — Encounter: Payer: Self-pay | Admitting: Family Medicine

## 2024-05-16 ENCOUNTER — Telehealth: Payer: Self-pay | Admitting: Family Medicine

## 2024-05-16 DIAGNOSIS — N390 Urinary tract infection, site not specified: Secondary | ICD-10-CM

## 2024-05-16 NOTE — Telephone Encounter (Signed)
 Copied from CRM [id number]. Topic: General - Other >> May 16, 2024  2:31 PM [name] Branch wrote: Reason for CRM: Patient wants to let the lab know that she is dropping off a urine sample for Dr. Randeen.

## 2024-05-16 NOTE — Telephone Encounter (Signed)
 See mychart message regarding pt's request

## 2024-05-16 NOTE — Telephone Encounter (Signed)
 I put a future order in for urinalysis  They will have to being tomorrow   ER / UC recommendation if symptoms get severe tonight

## 2024-05-16 NOTE — Addendum Note (Signed)
 Addended by: RANDEEN HARDY A on: 05/16/2024 04:44 PM   Modules accepted: Orders

## 2024-05-17 ENCOUNTER — Other Ambulatory Visit (INDEPENDENT_AMBULATORY_CARE_PROVIDER_SITE_OTHER)

## 2024-05-17 ENCOUNTER — Ambulatory Visit: Payer: Self-pay | Admitting: Family Medicine

## 2024-05-17 DIAGNOSIS — N390 Urinary tract infection, site not specified: Secondary | ICD-10-CM | POA: Diagnosis not present

## 2024-05-17 DIAGNOSIS — R8290 Unspecified abnormal findings in urine: Secondary | ICD-10-CM

## 2024-05-17 LAB — POC URINALSYSI DIPSTICK (AUTOMATED)
Bilirubin, UA: NEGATIVE
Blood, UA: 10
Glucose, UA: NEGATIVE
Ketones, UA: NEGATIVE
Nitrite, UA: NEGATIVE
Protein, UA: POSITIVE — AB
Spec Grav, UA: 1.015 (ref 1.010–1.025)
Urobilinogen, UA: 1 U/dL
pH, UA: 8 (ref 5.0–8.0)

## 2024-05-17 NOTE — Telephone Encounter (Signed)
 Pt was advised via my chart.

## 2024-05-19 LAB — URINE CULTURE
MICRO NUMBER:: 16735770
SPECIMEN QUALITY:: ADEQUATE

## 2024-05-19 MED ORDER — CEPHALEXIN 500 MG PO CAPS: 500.0000 mg | ORAL_CAPSULE | Freq: Two times a day (BID) | ORAL | 0 refills | Status: DC

## 2024-06-07 ENCOUNTER — Ambulatory Visit: Payer: Medicare Other

## 2024-06-07 VITALS — Ht 66.0 in | Wt 162.0 lb

## 2024-06-07 DIAGNOSIS — Z0000 Encounter for general adult medical examination without abnormal findings: Secondary | ICD-10-CM | POA: Diagnosis not present

## 2024-06-07 NOTE — Patient Instructions (Signed)
 [name] Branch , Thank you for taking time out of your busy schedule to complete your Annual Wellness Visit with me. I enjoyed our conversation and look forward to speaking with you again next year. I, as well as your care team,  appreciate your ongoing commitment to your health goals. Please review the following plan we discussed and let me know if I can assist you in the future. Your Game plan/ To Do List    Referrals: If you haven't heard from the office you've been referred to, please reach out to them at the phone provided.   Follow up Visits: We will see or speak with you next year for your Next Medicare AWV with our clinical staff Have you seen your provider in the last 6 months (3 months if uncontrolled diabetes)? Yes  Clinician Recommendations:  Aim for 30 minutes of exercise or brisk walking, 6-8 glasses of water , and 5 servings of fruits and vegetables each day.       This is a list of the screenings recommended for you:  Health Maintenance  Topic Date Due   Pneumococcal Vaccine for age over 10 (1 of 1 - PCV) Never done   Flu Shot  05/26/2024   COVID-19 Vaccine (3 - Pfizer risk series) 11/25/2024*   Zoster (Shingles) Vaccine (1 of 2) 02/07/2025*   DTaP/Tdap/Td vaccine (3 - Td or Tdap) 06/26/2024   Mammogram  08/23/2024   Medicare Annual Wellness Visit  06/07/2025   Colon Cancer Screening  01/23/2027   DEXA scan (bone density measurement)  Completed   Hepatitis C Screening  Completed   Hepatitis B Vaccine  Aged Out   HPV Vaccine  Aged Out   Meningitis B Vaccine  Aged Out  *Topic was postponed. The date shown is not the original due date.    Advanced directives: (Copy Requested) Please bring a copy of your health care power of attorney and living will to the office to be added to your chart at your convenience. You can mail to [facility] [address] [place], [place] [address] or email to [email or web address] Advance Care Planning is important  because it:  [x]  Makes sure you receive the medical care that is consistent with your values, goals, and preferences  [x]  It provides guidance to your family and loved ones and reduces their decisional burden about whether or not they are making the right decisions based on your wishes.  Follow the link provided in your after visit summary or read over the paperwork we have mailed to you to help you started getting your Advance Directives in place. If you need assistance in completing these, please reach out to us  so that we can help you!

## 2024-06-07 NOTE — Progress Notes (Signed)
 Subjective:   [name] Branch is a 68 y.o. who presents for a Medicare Wellness preventive visit.  As a reminder, Annual Wellness Visits don't include a physical exam, and some assessments may be limited, especially if this visit is performed virtually. We may recommend an in-person follow-up visit with your provider if needed.  Visit Complete: Virtual I connected with  [name] Branch on 06/07/24 by a audio enabled telemedicine application and verified that I am speaking with the correct person using two identifiers.  Patient Location: Home  Provider Location: Office/Clinic  I discussed the limitations of evaluation and management by telemedicine. The patient expressed understanding and agreed to proceed.  Vital Signs: Because this visit was a virtual/telehealth visit, some criteria may be missing or patient reported. Any vitals not documented were not able to be obtained and vitals that have been documented are patient reported.  VideoDeclined- This patient declined Librarian, academic. Therefore the visit was completed with audio only.  Persons Participating in Visit: Patient.  AWV Questionnaire: Yes: Patient Medicare AWV questionnaire was completed by the patient on 06/06/24; I have confirmed that all information answered by patient is correct and no changes since this date.  Cardiac Risk Factors include: advanced age (>28men, >45 women);sedentary lifestyle    Objective:    Today's Vitals   06/06/24 2138 06/07/24 1457  Weight:  162 lb (73.5 kg)  Height:  5' 6 (1.676 m)  PainSc: 5     Body mass index is 26.15 kg/m.     06/07/2024    3:22 PM 03/09/2024    1:38 PM 06/02/2023    2:20 PM 04/26/2023    2:11 AM 09/25/2022    2:00 PM 09/25/2022    6:45 AM 09/11/2022   11:25 AM  Advanced Directives  Does Patient Have a Medical Advance Directive? Yes No Yes No Yes Yes Yes  Type of Estate agent of [place];Living will  Healthcare  Power of [place];Living will  Healthcare Power of [employer] of [place];Living will Healthcare Power of [place];Living will  Does patient want to make changes to medical advance directive?     No - Patient declined    Copy of Healthcare Power of Attorney in Chart? No - copy requested  No - copy requested    Yes - validated most recent copy scanned in chart (See row information)    Current Medications (verified) Outpatient Encounter Medications as of 06/07/2024  Medication Sig   Ascorbic Acid (VITAMIN C GUMMIE PO) Take 1 capsule by mouth daily.   Cholecalciferol  (VITAMIN D ) 125 MCG (5000 UT) CAPS Take 5,000 Units by mouth daily.   Cranberry-Vitamin C-Probiotic (AZO CRANBERRY) 250-30 MG TABS Take 1 capsule by mouth daily as needed.   docusate sodium  (COLACE) 100 MG capsule Take 1 capsule (100 mg total) by mouth 2 (two) times daily.   Garlic 1000 MG CAPS Take 1 capsule by mouth daily.   HM MAGNESIUM  CITRATE PO Take 2 tablets by mouth daily.   Multiple Vitamin (MULTIVITAMIN) tablet Take 1 tablet by mouth daily.   Multiple Vitamins-Minerals (HAIR SKIN AND NAILS FORMULA PO) Take 1 tablet by mouth daily.   ondansetron  (ZOFRAN -ODT) 8 MG disintegrating tablet TAKE 1 TABLET BY MOUTH DISSOLVED ON TONGUE EVERY 8 HOURS AS NEEDED FOR NAUSEA   pantoprazole  (PROTONIX ) 40 MG tablet Take 1 tablet (40 mg total) by mouth daily.   polyethylene glycol (MIRALAX  / GLYCOLAX ) 17 g packet Take 17 g by mouth daily.  promethazine  (PHENERGAN ) 25 MG tablet Take 1 tablet (25 mg total) by mouth every 8 (eight) hours as needed.   vitamin B-12 (CYANOCOBALAMIN ) 1000 MCG tablet Place 1,000 mcg under the tongue daily.   cephALEXin  (KEFLEX ) 500 MG capsule Take 1 capsule (500 mg total) by mouth 2 (two) times daily. (Patient not taking: Reported on 06/07/2024)   Iron  18 MG/15ML LIQD FLORADIX LIQUID NATURAL IRON    nystatin  (MYCOSTATIN ) 100000 UNIT/ML suspension Take 5 mLs (500,000 Units total) by mouth in the morning,  at noon, and at bedtime.   No facility-administered encounter medications on file as of 06/07/2024.    Allergies (verified) Other and Sulfonamide derivatives   History: Past Medical History:  Diagnosis Date   Allergic rhinitis    Anemia    Arthritis    Chronic fatigue syndrome    CFIDs; also chronic joint and mucle pain (possible tik bourne illness?). Dr. Mac    Chronic gastritis    Dyspnea    GERD (gastroesophageal reflux disease)    Hepatic steatosis    History of kidney stones 05/2017   Hyperlipidemia    IBS (irritable bowel syndrome)    Lyme disease    stari   Migraine    Muscle twitching    Neuromuscular disorder (HCC)    Pneumonia    PONV (postoperative nausea and vomiting)    Pre-diabetes    Rectal prolapse    Schatzki's ring    Ulcer 2023   Umbilical hernia    Vaginal prolapse    West Nile Virus infection 2003   Past Surgical History:  Procedure Laterality Date   5th R toe fx  '09   ABDOMINAL HYSTERECTOMY     BREAST BIOPSY  '04   neg; right   CHOLECYSTECTOMY  10/2011   COLONOSCOPY  12/02/2021   2017- 2- TAs   CYSTOSCOPY WITH STENT PLACEMENT Right 05/25/2017   Procedure: CYSTOSCOPY WITH STENT PLACEMENT;  Surgeon: Penne Knee, MD;  Location: [facility];  Service: Urology;  Laterality: Right;   CYSTOSCOPY WITH STENT PLACEMENT Right 06/21/2017   Procedure: CYSTOSCOPY WITH STENT EXCHANGE;  Surgeon: Penne Knee, MD;  Location: [facility];  Service: Urology;  Laterality: Right;   DIAGNOSTIC LAPAROSCOPY     EXTRACORPOREAL SHOCK WAVE LITHOTRIPSY Right 06/10/2017   Procedure: EXTRACORPOREAL SHOCK WAVE LITHOTRIPSY (ESWL);  Surgeon: Penne Knee, MD;  Location: [facility];  Service: Urology;  Laterality: Right;   HOLMIUM LASER APPLICATION Right 06/21/2017   Procedure: HOLMIUM LASER APPLICATION;  Surgeon: Penne Knee, MD;  Location: [facility];  Service: Urology;  Laterality: Right;   laprascopy     PARTIAL HYSTERECTOMY     bleeding   ROBOTIC ASSISTED  LAPAROSCOPIC SACROCOLPOPEXY N/A 09/25/2022   Procedure: XI ROBOTIC ASSISTED LAPAROSCOPIC SACROCOLPOPEXY; BILATERAL SALPINGOOPHERECTOMY;  Surgeon: Cam Morene ORN, MD;  Location: [facility];  Service: Urology;  Laterality: N/A;  240 MINUTES NEEDED FOR CASE   SPINE SURGERY  2008?   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  Required surgery.   Cancer Father    Throat cancer Father 4 July 06, 1998       Diagnosed shortly before he died. Cause of death was dizziness from one of the medications (or combination) that led to a fall down a flight of stairs.  Died in ICU next day.   Bladder Cancer Father    Skin cancer Father 38 - 72       Had areas removed at various times.  Was out in sun a lot but after diagnosed began religiously using sunscreen, wore a hat and sunglasses.   High Cholesterol Father 35 - 52   Breast cancer Sister    Liver disease Sister 71 - 62       Fatty liver   Obesity Sister 34 - 72       Was thin until menopause, then gradually gained a lot of weight. She's also on disability & gets a lot of food banks.  The free food is not usually very healthy.   Cerebral aneurysm Maternal Grandmother 30 07-06-2038       Died from the aneurysm.   Alcohol abuse Maternal Grandfather        Served in [employer] & saw action which no doubt caused or worsened his drinking.   Cancer Maternal  Grandfather        Unsure when diagnosed or type of cancer but it was his cause of death.   Breast cancer Paternal Grandmother 65 - 109       She died from this cancer.   Lung cancer Paternal Grandfather 78 - 45       He died from this cancer.   Bipolar disorder Other        some family   Breast cancer Sister 68 - 36   Skin cancer Brother 54 07-06-68       Several surgeries to remove places on face.   Colon cancer Neg Hx    Esophageal cancer Neg Hx    Pancreatic cancer Neg Hx    Ulcerative colitis Neg Hx    Social History   Socioeconomic History   Marital status: Married    Spouse name: Not on file   Number of children: 0   Years of education: Not on file   Highest education level: Bachelor's degree (e.g., BA, AB, BS)  Occupational History   Occupation: owner  Tobacco Use   Smoking status: Never   Smokeless tobacco: Never   Tobacco comments:    non smoker  Vaping Use   Vaping status: Never Used  Substance and Sexual Activity   Alcohol use: No    Alcohol/week: 0.0 standard drinks of alcohol   Drug use: Never   Sexual activity: Not Currently  Other Topics Concern   Not on file  Social History Narrative   Runs a wildlife center. Married, no children.    Social Drivers of Corporate investment banker Strain: Low Risk  (06/06/2024)   Overall Financial Resource Strain (CARDIA)    Difficulty of Paying Living Expenses: Not hard at all  Food Insecurity: No Food Insecurity (06/06/2024)   Hunger Vital Sign    Worried About Running Out of Food in the Last Year: Never true  Ran Out of Food in the Last Year: Never true  Transportation Needs: No Transportation Needs (06/06/2024)   PRAPARE - Administrator, Civil Service (Medical): No    Lack of Transportation (Non-Medical): No  Physical Activity: Unknown (06/06/2024)   Exercise Vital Sign    Days of Exercise per Week: Patient declined    Minutes of Exercise per Session: Not on file  Stress: No Stress Concern Present  (06/06/2024)   [employer] of Occupational Health - Occupational Stress Questionnaire    Feeling of Stress: Not at all  Social Connections: Unknown (06/06/2024)   Social Connection and Isolation Panel    Frequency of Communication with Friends and Family: Patient declined    Frequency of Social Gatherings with Friends and Family: Patient declined    Attends Religious Services: Patient declined    Database administrator or Organizations: Patient declined    Attends Engineer, structural: Not on file    Marital Status: Married    Tobacco Counseling Counseling given: Not Answered Tobacco comments: non smoker    Clinical Intake:  Pre-visit preparation completed: Yes  Pain : 0-10 Pain Score: 5  Pain Type: Chronic pain Pain Location: Generalized Pain Descriptors / Indicators: Aching Pain Onset: More than a month ago Pain Frequency: Constant     BMI - recorded: 26.15 Nutritional Status: BMI 25 -29 Overweight Nutritional Risks: Nausea/ vomitting/ diarrhea (GI bug right now and at times due to diverticulitis) Diabetes: No  Lab Results  Component Value Date   HGBA1C 5.1 02/23/2024   HGBA1C 5.4 06/09/2023   HGBA1C 5.4 09/01/2022     How often do you need to have someone help you when you read instructions, pamphlets, or other written materials from your doctor or pharmacy?: 1 - Never  Interpreter Needed?: No  Comments: lives with husband Information entered by :: B.Kordae Buonocore,LPN   Activities of Daily Living     06/06/2024    9:38 PM  In your present state of health, do you have any difficulty performing the following activities:  Hearing? 0  Vision? 0  Difficulty concentrating or making decisions? 1  Walking or climbing stairs? 1  Dressing or bathing? 1  Doing errands, shopping? 1  Preparing Food and eating ? Y  Using the Toilet? Y  In the past six months, have you accidently leaked urine? Y  Do you have problems with loss of bowel control? N   Managing your Medications? Y  Managing your Finances? Y  Housekeeping or managing your Housekeeping? Y    Patient Care Team: Tower, Laine LABOR, MD as PCP - General (Family Medicine) Babara Call, MD as Consulting Physician (Hematology and Oncology) [facility] in [place]  I have updated your Care Teams any recent Medical Services you may have received from other providers in the past year.     Assessment:   This is a routine wellness examination for [name] Branch.  Hearing/Vision screen Hearing Screening - Comments:: Pt says her hearing is good Vision Screening - Comments:: Pt says her vision is good w/glasses Dr Nicholaus   Goals Addressed             This Visit's Progress    COMPLETED: Patient Stated       05/14/2022, wants to get as well as she can     Patient Stated   On track    06/07/24-Walk again without falling or lots of pain       Depression Screen  06/07/2024    3:20 PM 06/09/2023    2:40 PM 06/02/2023    2:19 PM 05/14/2022    3:43 PM 08/28/2020    3:41 PM  PHQ 2/9 Scores  PHQ - 2 Score 0 0 0 0 0  PHQ- 9 Score  2       Fall Risk     06/06/2024    9:38 PM 06/09/2023    2:39 PM 05/30/2023    9:03 PM 05/14/2022    3:41 PM 08/28/2020    3:41 PM  Fall Risk   Falls in the past year? 0 0 0 0 1  Number falls in past yr: 0 0 0 0 1  Injury with Fall? 0 0 0 0 0  Risk for fall due to : Impaired mobility;Impaired balance/gait;Orthopedic patient;History of fall(s)  No Fall Risks Impaired balance/gait;History of fall(s);Impaired mobility;Medication side effect   Follow up Education provided;Falls prevention discussed Falls evaluation completed Falls prevention discussed;Falls evaluation completed Falls evaluation completed;Education provided;Falls prevention discussed       Data saved with a previous flowsheet row definition    MEDICARE RISK AT HOME:  Medicare Risk at Home Any stairs in or around the home?: (Patient-Rptd) Yes If so, are there any without  handrails?: (Patient-Rptd) No Home free of loose throw rugs in walkways, pet beds, electrical cords, etc?: (Patient-Rptd) Yes Adequate lighting in your home to reduce risk of falls?: (Patient-Rptd) Yes Life alert?: (Patient-Rptd) No Use of a cane, walker or w/c?: (Patient-Rptd) Yes Grab bars in the bathroom?: (Patient-Rptd) No Shower chair or bench in shower?: (Patient-Rptd) Yes Elevated toilet seat or a handicapped toilet?: (Patient-Rptd) Yes  TIMED UP AND GO:  Was the test performed?  No  Cognitive Function: 6CIT completed        06/07/2024    3:24 PM 06/02/2023    2:24 PM 05/14/2022    3:46 PM  6CIT Screen  What Year? 0 points 0 points 0 points  What month? 0 points 0 points 0 points  What time? 0 points 0 points 0 points  Count back from 20 0 points 0 points 0 points  Months in reverse 0 points 0 points 0 points  Repeat phrase 0 points 0 points 2 points  Total Score 0 points 0 points 2 points    Immunizations Immunization History  Administered Date(s) Administered   PFIZER(Purple Top)SARS-COV-2 Vaccination 08/09/2020, 08/30/2020   Td 09/25/2004   Tdap 06/26/2014    Screening Tests Health Maintenance  Topic Date Due   Pneumococcal Vaccine: 50+ Years (1 of 1 - PCV) Never done   INFLUENZA VACCINE  05/26/2024   COVID-19 Vaccine (3 - Pfizer risk series) 11/25/2024 (Originally 09/27/2020)   Zoster Vaccines- Shingrix (1 of 2) 02/07/2025 (Originally [birth year]1975)   DTaP/Tdap/Td (3 - Td or Tdap) 06/26/2024   MAMMOGRAM  08/23/2024   Medicare Annual Wellness (AWV)  06/07/2025   Colonoscopy  01/23/2027   DEXA SCAN  Completed   Hepatitis C Screening  Completed   Hepatitis B Vaccines  Aged Out   HPV VACCINES  Aged Out   Meningococcal B Vaccine  Aged Out    Health Maintenance  Health Maintenance Due  Topic Date Due   Pneumococcal Vaccine: 50+ Years (1 of 1 - PCV) Never done   INFLUENZA VACCINE  05/26/2024   Health Maintenance Items Addressed: None at this  time  Additional Screening:  Vision Screening: Recommended annual ophthalmology exams for early detection of glaucoma and other disorders of the eye. Would you  like a referral to an eye doctor? No    Dental Screening: Recommended annual dental exams for proper oral hygiene  Community Resource Referral / Chronic Care Management: CRR required this visit?  No   CCM required this visit?  No   Plan:    I have personally reviewed and noted the following in the patient's chart:   Medical and social history Use of alcohol, tobacco or illicit drugs  Current medications and supplements including opioid prescriptions. Patient is not currently taking opioid prescriptions. Functional ability and status Nutritional status Physical activity Advanced directives List of other physicians Hospitalizations, surgeries, and ER visits in previous 12 months Vitals Screenings to include cognitive, depression, and falls Referrals and appointments  In addition, I have reviewed and discussed with patient certain preventive protocols, quality metrics, and best practice recommendations. A written personalized care plan for preventive services as well as general preventive health recommendations were provided to patient.   [name] LITTIE Saris, LPN   1/[id number]/7974   After Visit Summary: (MyChart) Due to this being a telephonic visit, the after visit summary with patients personalized plan was offered to patient via MyChart   Notes: Pt relays concerns whether still anemic or UTI completely gone: inquiring about labs that will be done at [facility] on 06/20/24. Pt says she is  looking forward to the visit and receiving her vaccines.

## 2024-06-08 ENCOUNTER — Ambulatory Visit: Admitting: Physician Assistant

## 2024-06-14 ENCOUNTER — Ambulatory Visit: Admitting: Physician Assistant

## 2024-06-15 ENCOUNTER — Encounter: Payer: Self-pay | Admitting: Family Medicine

## 2024-06-20 ENCOUNTER — Ambulatory Visit (INDEPENDENT_AMBULATORY_CARE_PROVIDER_SITE_OTHER): Admitting: Family Medicine

## 2024-06-20 ENCOUNTER — Encounter: Payer: Self-pay | Admitting: Family Medicine

## 2024-06-20 VITALS — BP 130/78 | HR 119 | Temp 98.2°F | Ht 66.0 in | Wt 155.0 lb

## 2024-06-20 DIAGNOSIS — M810 Age-related osteoporosis without current pathological fracture: Secondary | ICD-10-CM

## 2024-06-20 DIAGNOSIS — K029 Dental caries, unspecified: Secondary | ICD-10-CM

## 2024-06-20 DIAGNOSIS — Z8781 Personal history of (healed) traumatic fracture: Secondary | ICD-10-CM

## 2024-06-20 DIAGNOSIS — Z23 Encounter for immunization: Secondary | ICD-10-CM | POA: Diagnosis not present

## 2024-06-20 DIAGNOSIS — Z7185 Encounter for immunization safety counseling: Secondary | ICD-10-CM

## 2024-06-20 DIAGNOSIS — R7303 Prediabetes: Secondary | ICD-10-CM

## 2024-06-20 DIAGNOSIS — N3946 Mixed incontinence: Secondary | ICD-10-CM

## 2024-06-20 DIAGNOSIS — D500 Iron deficiency anemia secondary to blood loss (chronic): Secondary | ICD-10-CM

## 2024-06-20 DIAGNOSIS — R1110 Vomiting, unspecified: Secondary | ICD-10-CM

## 2024-06-20 DIAGNOSIS — E7800 Pure hypercholesterolemia, unspecified: Secondary | ICD-10-CM | POA: Diagnosis not present

## 2024-06-20 DIAGNOSIS — K760 Fatty (change of) liver, not elsewhere classified: Secondary | ICD-10-CM

## 2024-06-20 DIAGNOSIS — G9332 Myalgic encephalomyelitis/chronic fatigue syndrome: Secondary | ICD-10-CM

## 2024-06-20 DIAGNOSIS — R748 Abnormal levels of other serum enzymes: Secondary | ICD-10-CM

## 2024-06-20 DIAGNOSIS — Z1211 Encounter for screening for malignant neoplasm of colon: Secondary | ICD-10-CM

## 2024-06-20 DIAGNOSIS — K2950 Unspecified chronic gastritis without bleeding: Secondary | ICD-10-CM

## 2024-06-20 DIAGNOSIS — Z79899 Other long term (current) drug therapy: Secondary | ICD-10-CM | POA: Diagnosis not present

## 2024-06-20 DIAGNOSIS — N8110 Cystocele, unspecified: Secondary | ICD-10-CM

## 2024-06-20 NOTE — Assessment & Plan Note (Signed)
 Doing well with venofer  infusions  Much better energy level  Seeing GI -no source found for iron   Sees Dr Babara  Pt asked for cbc and iron  level today

## 2024-06-20 NOTE — Assessment & Plan Note (Signed)
 Dexa 06/2023  No falls No new fracture  Aware of old L1 compression fracture Limited exercise due to chronic fatigue and pain  Taking ca and D Will plan to discuss treatment once dental work is done and she is healed

## 2024-06-20 NOTE — Assessment & Plan Note (Signed)
 Lab Results  Component Value Date   [id number] >1537 (H) 02/23/2024   D level today

## 2024-06-20 NOTE — Assessment & Plan Note (Signed)
Disc goals for lipids and reasons to control them Rev last labs with pt Rev low sat fat diet in detail  Lab today

## 2024-06-20 NOTE — Assessment & Plan Note (Signed)
 Prevnar 20 today  Plans to get flu and covid shots in the fall   Plans to get RSV vaccine in the winter

## 2024-06-20 NOTE — Assessment & Plan Note (Signed)
 Continues patoprazole 40 mg daily (down from bid) and famotidine    For GI follow up soon

## 2024-06-20 NOTE — Assessment & Plan Note (Signed)
 Pt is planning dental care/ suspect will plan extractions  Suspect this will decrease risk of other health problems   Once dental work is done and healed- will discuss medication for osteoporosis

## 2024-06-20 NOTE — Assessment & Plan Note (Addendum)
 L1-dating back to 2021 likely Suspect this is too old to be treated with a kyphoplasty/vertebroplasty Ongoing back pain-unsure if related to this  Holding off on osteoporosis treatment to get dental work done   Pt is considering orthopedic visit for other back pain

## 2024-06-20 NOTE — Assessment & Plan Note (Signed)
 Wonder if dental disease is adding to fatigue  Plan for dental care is in place

## 2024-06-20 NOTE — Assessment & Plan Note (Signed)
 With nausea No cause found   For GI follow up soon

## 2024-06-20 NOTE — Assessment & Plan Note (Signed)
 A1c today  Has lost weight/eating less  disc imp of low glycemic diet and wt loss to prevent DM2

## 2024-06-20 NOTE — Assessment & Plan Note (Signed)
Colonoscopy 38756 with 5 y recall

## 2024-06-20 NOTE — Assessment & Plan Note (Signed)
 LFTs today GI follow up coming soon   Fibrosis 4 Score = 3.42  Fib-4 interpretation is not validated for people under 35 or over 68 years of age. However, scores under 2.0 are generally considered low risk.

## 2024-06-20 NOTE — Progress Notes (Signed)
 Subjective:    Patient ID: [name] Branch, female    DOB: 02-26-1956, 68 y.o.   MRN: [id number]  HPI  Pt presents for annual follow up of chronic medical problems   Wt Readings from Last 3 Encounters:  06/20/24 155 lb (70.3 kg)  06/07/24 162 lb (73.5 kg)  03/03/24 162 lb (73.5 kg)   Lost to 155 lb at home In wheelchair  25.02 kg/m  Vitals:   06/20/24 1506  BP: 130/78  Pulse: (!) 119  Temp: 98.2 F (36.8 C)  SpO2: 97%    Immunization History  Administered Date(s) Administered   PFIZER(Purple Top)SARS-COV-2 Vaccination 08/09/2020, 08/30/2020   PNEUMOCOCCAL CONJUGATE-20 06/20/2024   Td 09/25/2004   Tdap 06/26/2014    There are no preventive care reminders to display for this patient.   Immunizations  Pna vaccine -wants today Shingrix vaccine - declines    Mammogram 07/2023  Self breast exam-no lumps   Gyn health   Colon cancer screening  Colonoscopy 12/2021 with 5 y recall   Bone health  Dexa 06/2023  osteoporosis  Falls-none  Fractures-past compression fractures  Mod kyphosis   from CT scan in 2021  Musculoskeletal: There is a compression deformity L1 vertebral body with up to 50% height loss centrally which has a remote appearance though is new since comparison in 2018 Supplements -ca and D   Exercise Just bought some 1 lb hand weights   Unable to be physically active due to CFS  Dental health  Has appointment 07/04/24 with dentist for consultation  Has decay and poor dental health    Skin health  Has psoriasis  Some discoloration of skin      Mood    06/07/2024    3:20 PM 06/09/2023    2:40 PM 06/02/2023    2:19 PM 05/14/2022    3:43 PM 08/28/2020    3:41 PM  Depression screen PHQ 2/9  Decreased Interest 0 0 0 0 0  Down, Depressed, Hopeless 0 0 0 0 0  PHQ - 2 Score 0 0 0 0 0  Altered sleeping  0     Tired, decreased energy  1     Change in appetite  1     Feeling bad or failure about yourself   0     Trouble concentrating  0      Moving slowly or fidgety/restless  0     Suicidal thoughts  0     PHQ-9 Score  2      Fatty liver Lab Results  Component Value Date   ALT 16 02/23/2024   AST 36 02/23/2024   ALKPHOS 133 (H) 02/23/2024   BILITOT 1.2 02/23/2024     Gastritis  Protonix  40 mg daily   Lab Results  Component Value Date   VITAMINB12 >1537 (H) 02/23/2024   Iron  deficiency  Has had IV venofer -does feel better /more energy  Sees Dr Babara Another infusion may be 1.5 months depending on her iron  level   Lab Results  Component Value Date   WBC 4.4 05/03/2024   HGB 11.9 (L) 05/03/2024   HCT 39.3 05/03/2024   MCV 73.7 (L) 05/03/2024   PLT 179 05/03/2024   Lab Results  Component Value Date   IRON  58 05/03/2024   TIBC 393 05/03/2024   FERRITIN 20 05/03/2024    Prediabetes Lab Results  Component Value Date   HGBA1C 5.1 02/23/2024   Hyperlipidemia Lab Results  Component Value Date   CHOL 151  06/09/2023   HDL 47.20 06/09/2023   LDLCALC 90 06/09/2023   TRIG 70.0 06/09/2023   CHOLHDL 3 06/09/2023   Plant based diet    Continued incontinence problems  Sees urology  Not happy with how her surgery came out  Still significant incontinence Thinks rectocele plays role with UTIs   Has appointment with [facility] to discuss her anemia and rectocele     Patient Active Problem List   Diagnosis Date Noted   Dental decay 01/18/2024   Vaccine counseling 01/18/2024   Current use of proton pump inhibitor 06/09/2023   Scoliosis 06/09/2023   Iron  deficiency 06/09/2023   Nausea 06/09/2023   Fatty liver 09/09/2022   IDA (iron  deficiency anemia) 09/04/2022   Pre-operative cardiovascular examination 09/01/2022   Gastritis 02/10/2022   Female bladder prolapse 02/10/2022   Encounter for screening mammogram for breast cancer 02/10/2022   Other chronic pain    History of compression fracture of spine 08/28/2020   Mixed stress and urge urinary incontinence 10/29/2017   Frequent UTI 02/18/2016   Tremor  11/12/2015   Cystocele, unspecified 04/12/2015   Routine general medical examination at a health care facility 06/26/2014   Osteoporosis 06/26/2014   Prediabetes 06/26/2014   Colon cancer screening 06/26/2014   Intertrigo 07/12/2013   Chronic fatigue syndrome 01/19/2013   Elevated alkaline phosphatase level 09/19/2012   Chronic vomiting 08/05/2011   Tachycardia 08/05/2011   FREQUENCY, URINARY 08/06/2010   CONSTIPATION, CHRONIC 07/30/2008   Irritable bowel syndrome 07/30/2008   Pure hypercholesterolemia 06/21/2008   POSTMENOPAUSAL STATUS 06/21/2008   VAGINITIS, ATROPHIC 06/21/2008   Past Medical History:  Diagnosis Date   Allergic rhinitis    Allergy 1975   Anemia 2023   Arthritis 2008   Chronic fatigue syndrome    CFIDs; also chronic joint and mucle pain (possible tik bourne illness?). Dr. Mac    Chronic gastritis    Dyspnea    GERD (gastroesophageal reflux disease) 2023   Hepatic steatosis    History of kidney stones 05/2017   Hyperlipidemia    IBS (irritable bowel syndrome)    Lyme disease    stari   Migraine    Muscle twitching    Neuromuscular disorder (HCC) 1995   M.E./CFIDS   Pneumonia    PONV (postoperative nausea and vomiting)    Pre-diabetes    Rectal prolapse    Schatzki's ring    Ulcer 2023   Umbilical hernia    Vaginal prolapse    West Nile Virus infection 2003   Past Surgical History:  Procedure Laterality Date   5th R toe fx  '09   ABDOMINAL HYSTERECTOMY  1982   Severe menstrual cycles, retrograde menstrual flow, inability to pregnancies to term/miscarriages   BREAST BIOPSY  '04   neg; right   CHOLECYSTECTOMY  10/2011   COLONOSCOPY  12/02/2021   2017- 2- TAs   CYSTOSCOPY WITH STENT PLACEMENT Right 05/25/2017   Procedure: CYSTOSCOPY WITH STENT PLACEMENT;  Surgeon: Penne Knee, MD;  Location: [facility];  Service: Urology;  Laterality: Right;   CYSTOSCOPY WITH STENT PLACEMENT Right 06/21/2017   Procedure: CYSTOSCOPY WITH STENT EXCHANGE;   Surgeon: Penne Knee, MD;  Location: [facility];  Service: Urology;  Laterality: Right;   DIAGNOSTIC LAPAROSCOPY     EXTRACORPOREAL SHOCK WAVE LITHOTRIPSY Right 06/10/2017   Procedure: EXTRACORPOREAL SHOCK WAVE LITHOTRIPSY (ESWL);  Surgeon: Penne Knee, MD;  Location: [facility];  Service: Urology;  Laterality: Right;   HOLMIUM LASER APPLICATION Right 06/21/2017   Procedure:  HOLMIUM LASER APPLICATION;  Surgeon: Penne Knee, MD;  Location: [facility];  Service: Urology;  Laterality: Right;   laprascopy     PARTIAL HYSTERECTOMY     bleeding   ROBOTIC ASSISTED LAPAROSCOPIC SACROCOLPOPEXY N/A 09/25/2022   Procedure: XI ROBOTIC ASSISTED LAPAROSCOPIC SACROCOLPOPEXY; BILATERAL SALPINGOOPHERECTOMY;  Surgeon: Cam Morene ORN, MD;  Location: [facility];  Service: Urology;  Laterality: N/A;  240 MINUTES NEEDED FOR CASE   SPINE SURGERY  2007/07/14?   TONSILLECTOMY     TONSILLECTOMY     ULNAR NERVE REPAIR     sx times 5   UPPER GASTROINTESTINAL ENDOSCOPY  12/04/2004   gastritis   URETEROSCOPY WITH HOLMIUM LASER LITHOTRIPSY Right 06/21/2017   Procedure: URETEROSCOPY WITH HOLMIUM LASER LITHOTRIPSY;  Surgeon: Penne Knee, MD;  Location: [facility];  Service: Urology;  Laterality: Right;   Social History   Tobacco Use   Smoking status: Never   Smokeless tobacco: Never   Tobacco comments:    non smoker  Vaping Use   Vaping status: Never Used  Substance Use Topics   Alcohol use: No   Drug use: Never   Family History  Problem Relation Age of Onset   Hypertension Mother    Cancer Mother 48 - 38       Waldenstroms   Ulcers Mother    Arthritis Mother 100 - 47   Varicose Veins Mother 75 - 83   Diabetes Father    Hypertension Father    Stroke Father 16 - 49       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  Required surgery.   Cancer Father    Throat cancer Father 25 07/13/98       Diagnosed shortly before he died. Cause of death was dizziness from one of the medications (or combination)  that led to a fall down a flight of stairs.  Died in ICU next day.   Bladder Cancer Father    Skin cancer Father 45 - 9       Had areas removed at various times.  Was out in sun a lot but after diagnosed began religiously using sunscreen, wore a hat and sunglasses.   High Cholesterol Father 32 - 2   Breast cancer Sister    Liver disease Sister 71 - 77       Fatty liver   Obesity Sister 67 - 4       Was thin until menopause, then gradually gained a lot of weight. She's also on disability & gets a lot of food banks.  The free food is not usually very healthy.   Cerebral aneurysm Maternal Grandmother 30 07-13-38       Died from the aneurysm.   Alcohol abuse Maternal Grandfather        Served in [employer] & saw action which no doubt caused or worsened his drinking.   Cancer Maternal Grandfather        Unsure when diagnosed or type of cancer but it was his cause of death.   Breast cancer Paternal Grandmother 36 - 69       She died from this cancer.   Lung cancer Paternal Grandfather 39 - 53       He died from this cancer.   Bipolar disorder Other        some family   Breast cancer Sister 57 - 70   Skin cancer Brother 46 Jul 13, 2068       Several surgeries to remove places on  face.   Colon cancer Neg Hx    Esophageal cancer Neg Hx    Pancreatic cancer Neg Hx    Ulcerative colitis Neg Hx    Allergies  Allergen Reactions   Other Nausea And Vomiting    All Antibiotics   Sulfonamide Derivatives Nausea And Vomiting   Current Outpatient Medications on File Prior to Visit  Medication Sig Dispense Refill   Ascorbic Acid (VITAMIN C GUMMIE PO) Take 1 capsule by mouth daily.     Cholecalciferol  (VITAMIN D ) 125 MCG (5000 UT) CAPS Take 5,000 Units by mouth daily.     Cranberry-Vitamin C-Probiotic (AZO CRANBERRY) 250-30 MG TABS Take 1 capsule by mouth daily as needed.     docusate sodium  (COLACE) 100 MG capsule Take 1 capsule (100 mg total) by mouth 2 (two) times daily. 60 capsule 0   Garlic 1000 MG  CAPS Take 1 capsule by mouth daily.     HM MAGNESIUM  CITRATE PO Take 2 tablets by mouth daily.     Multiple Vitamin (MULTIVITAMIN) tablet Take 1 tablet by mouth daily.     Multiple Vitamins-Minerals (HAIR SKIN AND NAILS FORMULA PO) Take 1 tablet by mouth daily.     nystatin  (MYCOSTATIN ) 100000 UNIT/ML suspension Take 5 mLs (500,000 Units total) by mouth in the morning, at noon, and at bedtime. 120 mL 1   ondansetron  (ZOFRAN -ODT) 8 MG disintegrating tablet TAKE 1 TABLET BY MOUTH DISSOLVED ON TONGUE EVERY 8 HOURS AS NEEDED FOR NAUSEA 60 tablet 1   pantoprazole  (PROTONIX ) 40 MG tablet Take 1 tablet (40 mg total) by mouth daily. 30 tablet 4   polyethylene glycol (MIRALAX  / GLYCOLAX ) 17 g packet Take 17 g by mouth daily.     promethazine  (PHENERGAN ) 25 MG tablet Take 1 tablet (25 mg total) by mouth every 8 (eight) hours as needed. 45 tablet 11   vitamin B-12 (CYANOCOBALAMIN ) 1000 MCG tablet Place 1,000 mcg under the tongue daily.     No current facility-administered medications on file prior to visit.    Review of Systems  Constitutional:  Positive for fatigue. Negative for activity change, appetite change, fever and unexpected weight change.  HENT:  Positive for dental problem. Negative for congestion, ear pain, rhinorrhea, sinus pressure and sore throat.   Eyes:  Negative for pain, redness and visual disturbance.  Respiratory:  Negative for cough, shortness of breath and wheezing.   Cardiovascular:  Negative for chest pain and palpitations.  Gastrointestinal:  Positive for constipation, nausea and vomiting. Negative for abdominal pain, blood in stool and diarrhea.  Endocrine: Negative for polydipsia and polyuria.  Genitourinary:  Positive for frequency and urgency. Negative for decreased urine volume and dysuria.  Musculoskeletal:  Positive for arthralgias, back pain and myalgias. Negative for joint swelling.  Skin:  Negative for pallor and rash.       Dry skin  Allergic/Immunologic: Negative  for environmental allergies.  Neurological:  Negative for dizziness, syncope and headaches.  Hematological:  Negative for adenopathy. Does not bruise/bleed easily.  Psychiatric/Behavioral:  Negative for decreased concentration and dysphoric mood. The patient is not nervous/anxious.        Objective:   Physical Exam Constitutional:      General: She is not in acute distress.    Appearance: Normal appearance. She is well-developed and normal weight. She is not ill-appearing or diaphoretic.     Comments: Wheelchair bound  HENT:     Head: Normocephalic and atraumatic.     Right Ear: Tympanic membrane, ear canal  and external ear normal.     Left Ear: Tympanic membrane, ear canal and external ear normal.     Nose: Nose normal.     Mouth/Throat:     Mouth: Mucous membranes are moist.     Pharynx: Oropharynx is clear. No posterior oropharyngeal erythema.  Eyes:     General: No scleral icterus.    Extraocular Movements: Extraocular movements intact.     Conjunctiva/sclera: Conjunctivae normal.     Pupils: Pupils are equal, round, and reactive to light.  Neck:     Thyroid : No thyromegaly.     Vascular: No carotid bruit or JVD.  Cardiovascular:     Rate and Rhythm: Normal rate and regular rhythm.     Pulses: Normal pulses.     Heart sounds: Normal heart sounds.     No gallop.  Pulmonary:     Effort: Pulmonary effort is normal. No respiratory distress.     Breath sounds: Normal breath sounds. No wheezing or rales.     Comments: Good air exch Chest:     Chest wall: No tenderness.  Abdominal:     General: Bowel sounds are normal. There is no distension or abdominal bruit.     Palpations: Abdomen is soft. There is no mass.     Tenderness: There is no abdominal tenderness.     Hernia: No hernia is present.  Genitourinary:    Comments: Breast exam: No mass, nodules, thickening, tenderness, bulging, retraction, inflamation, nipple discharge or skin changes noted.  No axillary or  clavicular LA.     Musculoskeletal:        General: No tenderness. Normal range of motion.     Cervical back: Normal range of motion and neck supple. No rigidity. No muscular tenderness.     Right lower leg: No edema.     Left lower leg: No edema.     Comments: Mod to severe scoliosis  Mod to severe kyphosis   Lymphadenopathy:     Cervical: No cervical adenopathy.  Skin:    General: Skin is warm and dry.     Coloration: Skin is not pale.     Findings: No erythema or rash.     Comments: Very dry skin  Telengectasia on chest  Solar lentigines diffusely  No areas of psoriasis seen today  Neurological:     Mental Status: She is alert. Mental status is at baseline.     Cranial Nerves: No cranial nerve deficit.     Motor: No abnormal muscle tone.     Coordination: Coordination normal.     Gait: Gait normal.     Deep Tendon Reflexes: Reflexes are normal and symmetric. Reflexes normal.  Psychiatric:        Mood and Affect: Mood normal.        Cognition and Memory: Cognition and memory normal.           Assessment & Plan:   Problem List Items Addressed This Visit       Digestive   Gastritis   Continues patoprazole 40 mg daily (down from bid) and famotidine    For GI follow up soon       Relevant Orders   CBC with Differential/Platelet   Fatty liver   LFTs today GI follow up coming soon   Fibrosis 4 Score = 3.42  Fib-4 interpretation is not validated for people under 35 or over 41 years of age. However, scores under 2.0 are generally considered low risk.  Relevant Orders   Comprehensive metabolic panel with GFR   Dental decay   Pt is planning dental care/ suspect will plan extractions  Suspect this will decrease risk of other health problems   Once dental work is done and healed- will discuss medication for osteoporosis       Chronic vomiting   With nausea No cause found   For GI follow up soon          Nervous and Auditory   Chronic  fatigue syndrome   Wonder if dental disease is adding to fatigue  Plan for dental care is in place        Relevant Orders   TSH     Musculoskeletal and Integument   Osteoporosis - Primary   Dexa 06/2023  No falls No new fracture  Aware of old L1 compression fracture Limited exercise due to chronic fatigue and pain  Taking ca and D Will plan to discuss treatment once dental work is done and she is healed       Relevant Orders   VITAMIN D  25 Hydroxy (Vit-D Deficiency, Fractures)   History of compression fracture of spine   L1-dating back to 2021 likely Suspect this is too old to be treated with a kyphoplasty/vertebroplasty Ongoing back pain-unsure if related to this  Holding off on osteoporosis treatment to get dental work done   Pt is considering orthopedic visit for other back pain        Genitourinary   Female bladder prolapse   Still struggling with incontinence  Urology follow up planned  Also pt thinks rectocele is causing more problems and utis         Other   IDA (iron  deficiency anemia) (Chronic)   Doing well with venofer  infusions  Much better energy level  Seeing GI -no source found for iron   Sees Dr Babara  Pt asked for cbc and iron  level today      Relevant Orders   CBC with Differential/Platelet   Iron    Vaccine counseling   Prevnar 20 today  Plans to get flu and covid shots in the fall   Plans to get RSV vaccine in the winter       Pure hypercholesterolemia   Disc goals for lipids and reasons to control them Rev last labs with pt Rev low sat fat diet in detail  Lab today      Relevant Orders   Lipid panel   Comprehensive metabolic panel with GFR   Prediabetes   A1c today  Has lost weight/eating less  disc imp of low glycemic diet and wt loss to prevent DM2       Relevant Orders   Hemoglobin A1c   Mixed stress and urge urinary incontinence   For urology follow up Pt pt not as much improvement from surgery as planned        Elevated alkaline phosphatase level   May be due to dental disease  Other liver labs were normal in April   Lab today      Current use of proton pump inhibitor   Lab Results  Component Value Date   VITAMINB12 >1537 (H) 02/23/2024   D level today      Relevant Orders   VITAMIN D  25 Hydroxy (Vit-D Deficiency, Fractures)   Colon cancer screening   Colonoscopy 12/2021 with 5 y recall      Other Visit Diagnoses       Need for vaccination against Streptococcus pneumoniae  Relevant Orders   Pneumococcal conjugate vaccine 20-valent (Prevnar 20) (Completed)

## 2024-06-20 NOTE — Patient Instructions (Addendum)
 I do recommend a flu shot in the fall  A covid shot is a good idea as well    Then RSV shot - has to be at the pharmacy    Let's do the prevnar 20 vaccine today   Your mammogram is due in oct  Call next month and I will put in order for you to schedule    Add some strength training to your routine, this is important for bone and brain health and can reduce your risk of falls and help your body use insulin properly and regulate weight  Light weights, exercise bands , and internet videos are a good way to start  Yoga (chair or regular), machines , floor exercises or a gym with machines are also good options   Get your dental issues taken care of Then we can discuss treatment for osteoporosis    I want to make sure you get some protein for muscle mass and general health  The following are examples of protein in diet   Eggs  Dairy products  Soy products  Oat milk  Legumes  Almond milk Nuts and nut butters  Dried beans   Labs today   Follow up with GI regarding anemia, liver and rectocele  Follow up with urology about incontinence and utis   Get to the dentist

## 2024-06-20 NOTE — Assessment & Plan Note (Signed)
 Still struggling with incontinence  Urology follow up planned  Also pt thinks rectocele is causing more problems and utis

## 2024-06-20 NOTE — Assessment & Plan Note (Signed)
 May be due to dental disease  Other liver labs were normal in April   Lab today

## 2024-06-20 NOTE — Assessment & Plan Note (Signed)
 For urology follow up Pt pt not as much improvement from surgery as planned

## 2024-06-21 ENCOUNTER — Ambulatory Visit: Payer: Self-pay | Admitting: Family Medicine

## 2024-06-21 LAB — IRON: Iron: 119 ug/dL (ref 42–145)

## 2024-06-21 LAB — COMPREHENSIVE METABOLIC PANEL WITH GFR
ALT: 26 U/L (ref 0–35)
AST: 51 U/L — ABNORMAL HIGH (ref 0–37)
Albumin: 3.5 g/dL (ref 3.5–5.2)
Alkaline Phosphatase: 108 U/L (ref 39–117)
BUN: 9 mg/dL (ref 6–23)
CO2: 25 meq/L (ref 19–32)
Calcium: 9.3 mg/dL (ref 8.4–10.5)
Chloride: 101 meq/L (ref 96–112)
Creatinine, Ser: 0.47 mg/dL (ref 0.40–1.20)
GFR: 98.02 mL/min (ref >60.00)
Glucose, Bld: 97 mg/dL (ref 70–99)
Potassium: 4.1 meq/L (ref 3.5–5.1)
Sodium: 137 meq/L (ref 135–145)
Total Bilirubin: 2 mg/dL — ABNORMAL HIGH (ref 0.2–1.2)
Total Protein: 7 g/dL (ref 6.0–8.3)

## 2024-06-21 LAB — LIPID PANEL
Cholesterol: 157 mg/dL (ref 0–200)
HDL: 57.2 mg/dL (ref >39.00)
LDL Cholesterol: 84 mg/dL (ref 0–99)
NonHDL: 99.58
Total CHOL/HDL Ratio: 3
Triglycerides: 79 mg/dL (ref 0.0–149.0)
VLDL: 15.8 mg/dL (ref 0.0–40.0)

## 2024-06-21 LAB — HEMOGLOBIN A1C: Hgb A1c MFr Bld: 5.2 % (ref 4.6–6.5)

## 2024-06-21 LAB — VITAMIN D 25 HYDROXY (VIT D DEFICIENCY, FRACTURES): VITD: 71.05 ng/mL (ref 30.00–100.00)

## 2024-06-21 LAB — TSH: TSH: 1.74 u[IU]/mL (ref 0.35–5.50)

## 2024-06-21 NOTE — Addendum Note (Signed)
 Addended by: HOPE VEVA PARAS on: 06/21/2024 01:03 PM   Modules accepted: Orders

## 2024-06-27 ENCOUNTER — Other Ambulatory Visit

## 2024-06-27 ENCOUNTER — Ambulatory Visit: Payer: Self-pay | Admitting: Family Medicine

## 2024-06-27 ENCOUNTER — Other Ambulatory Visit (INDEPENDENT_AMBULATORY_CARE_PROVIDER_SITE_OTHER): Payer: Self-pay | Admitting: Family Medicine

## 2024-06-27 DIAGNOSIS — N390 Urinary tract infection, site not specified: Secondary | ICD-10-CM

## 2024-06-27 DIAGNOSIS — R8290 Unspecified abnormal findings in urine: Secondary | ICD-10-CM

## 2024-06-27 LAB — POC URINALSYSI DIPSTICK (AUTOMATED)
Bilirubin, UA: NEGATIVE
Blood, UA: NEGATIVE
Glucose, UA: NEGATIVE
Ketones, UA: NEGATIVE
Leukocytes, UA: NEGATIVE
Nitrite, UA: NEGATIVE
Protein, UA: NEGATIVE
Spec Grav, UA: 1.01 (ref 1.010–1.025)
Urobilinogen, UA: 1 U/dL
pH, UA: 8 (ref 5.0–8.0)

## 2024-06-27 NOTE — Telephone Encounter (Signed)
 I am ok with her dropping a sample for urinalysis

## 2024-06-30 ENCOUNTER — Ambulatory Visit: Payer: Self-pay | Admitting: Family Medicine

## 2024-06-30 LAB — URINE CULTURE
MICRO NUMBER:: 16910174
SPECIMEN QUALITY:: ADEQUATE

## 2024-06-30 MED ORDER — CIPROFLOXACIN HCL 250 MG PO TABS: 250.0000 mg | ORAL_TABLET | Freq: Two times a day (BID) | ORAL | 0 refills | Status: DC

## 2024-07-13 ENCOUNTER — Other Ambulatory Visit: Payer: Self-pay | Admitting: Family Medicine

## 2024-07-13 NOTE — Telephone Encounter (Signed)
 Last filled on 05/11/24 #60 tab/ 1 refill  CPE scheduled 06/22/25

## 2024-08-03 ENCOUNTER — Telehealth: Payer: Self-pay | Admitting: *Deleted

## 2024-08-03 ENCOUNTER — Encounter: Payer: Self-pay | Admitting: Family Medicine

## 2024-08-03 ENCOUNTER — Other Ambulatory Visit (INDEPENDENT_AMBULATORY_CARE_PROVIDER_SITE_OTHER)

## 2024-08-03 ENCOUNTER — Ambulatory Visit: Payer: Self-pay | Admitting: Family Medicine

## 2024-08-03 DIAGNOSIS — N390 Urinary tract infection, site not specified: Secondary | ICD-10-CM

## 2024-08-03 DIAGNOSIS — R8290 Unspecified abnormal findings in urine: Secondary | ICD-10-CM

## 2024-08-03 LAB — POC URINALSYSI DIPSTICK (AUTOMATED)
Bilirubin, UA: 1 — AB
Blood, UA: NEGATIVE
Glucose, UA: NEGATIVE
Ketones, UA: NEGATIVE
Nitrite, UA: POSITIVE — AB
Protein, UA: NEGATIVE
Spec Grav, UA: 1.02 (ref 1.010–1.025)
Urobilinogen, UA: 0.2 U/dL
pH, UA: 6 (ref 5.0–8.0)

## 2024-08-03 NOTE — Telephone Encounter (Signed)
 I put the order in (future urine dip)   If she cannot bring before noon then it has to be tomorrow because I am not here this afternoon   Thanks

## 2024-08-03 NOTE — Telephone Encounter (Signed)
 Copied from CRM #[id number]. Topic: Clinical - Request for Lab/Test Order >> Aug 03, 2024  8:31 AM [place] E wrote: Reason for CRM: Patient would like an order placed for her drop off a urine sample. Stated she frequently gets UTI's so it is nothing abnormal, she just wanted to get her urine tested. Please leave MyChart message for patient if/when she can drop this off.

## 2024-08-03 NOTE — Telephone Encounter (Signed)
 Pt dropped off urine and PCP aware

## 2024-08-04 ENCOUNTER — Inpatient Hospital Stay: Attending: Oncology

## 2024-08-04 DIAGNOSIS — D500 Iron deficiency anemia secondary to blood loss (chronic): Secondary | ICD-10-CM

## 2024-08-04 DIAGNOSIS — D509 Iron deficiency anemia, unspecified: Secondary | ICD-10-CM | POA: Insufficient documentation

## 2024-08-04 LAB — RETIC PANEL
Immature Retic Fract: 11.8 % (ref 2.3–15.9)
RBC.: 4.52 MIL/uL (ref 3.87–5.11)
Retic Count, Absolute: 68.3 K/uL (ref 19.0–186.0)
Retic Ct Pct: 1.5 % (ref 0.4–3.1)
Reticulocyte Hemoglobin: 31.7 pg (ref >27.9)

## 2024-08-04 LAB — CBC WITH DIFFERENTIAL (CANCER CENTER ONLY)
Abs Immature Granulocytes: 0.01 K/uL (ref 0.00–0.07)
Basophils Absolute: 0 K/uL (ref 0.0–0.1)
Basophils Relative: 1 %
Eosinophils Absolute: 0.1 K/uL (ref 0.0–0.5)
Eosinophils Relative: 3 %
HCT: 39.5 % (ref 36.0–46.0)
Hemoglobin: 12.7 g/dL (ref 12.0–15.0)
Immature Granulocytes: 0 %
Lymphocytes Relative: 34 %
Lymphs Abs: 1.1 K/uL (ref 0.7–4.0)
MCH: 27.9 pg (ref 26.0–34.0)
MCHC: 32.2 g/dL (ref 30.0–36.0)
MCV: 86.6 fL (ref 80.0–100.0)
Monocytes Absolute: 0.4 K/uL (ref 0.1–1.0)
Monocytes Relative: 12 %
Neutro Abs: 1.7 K/uL (ref 1.7–7.7)
Neutrophils Relative %: 50 %
Platelet Count: 168 K/uL (ref 150–400)
RBC: 4.56 MIL/uL (ref 3.87–5.11)
RDW: 14.1 % (ref 11.5–15.5)
WBC Count: 3.3 K/uL — ABNORMAL LOW (ref 4.0–10.5)
nRBC: 0 % (ref 0.0–0.2)

## 2024-08-04 LAB — IRON AND TIBC
Iron: 71 ug/dL (ref 28–170)
Saturation Ratios: 16 % (ref 10.4–31.8)
TIBC: 459 ug/dL — ABNORMAL HIGH (ref 250–450)
UIBC: 388 ug/dL

## 2024-08-04 LAB — FERRITIN: Ferritin: 13 ng/mL (ref 11–307)

## 2024-08-06 LAB — URINE CULTURE
MICRO NUMBER:: 17079120
SPECIMEN QUALITY:: ADEQUATE

## 2024-08-06 MED ORDER — CIPROFLOXACIN HCL 250 MG PO TABS: 250.0000 mg | ORAL_TABLET | Freq: Two times a day (BID) | ORAL | 0 refills | Status: DC

## 2024-08-11 ENCOUNTER — Inpatient Hospital Stay: Admitting: Oncology

## 2024-08-11 ENCOUNTER — Telehealth: Payer: Self-pay | Admitting: Oncology

## 2024-08-11 ENCOUNTER — Inpatient Hospital Stay

## 2024-08-11 NOTE — Telephone Encounter (Signed)
 Pt spouse called and needed to r/s MD/iron  appts from today 10/17.  I offered the next available on 10/23 and he stated pt already had other appts that week and cannot make that day.   (The next available after that is 11/26 but we cannot schedule irons day before or after holidays)   Appts have been r/s to md/iron  on 12/2 with another lab prior on 11/25 since the labs just done on 10/10 will be too far from new appt.

## 2024-08-16 ENCOUNTER — Encounter: Payer: Self-pay | Admitting: Physician Assistant

## 2024-08-16 ENCOUNTER — Ambulatory Visit (INDEPENDENT_AMBULATORY_CARE_PROVIDER_SITE_OTHER): Admitting: Physician Assistant

## 2024-08-16 VITALS — BP 142/72 | HR 115 | Ht 66.0 in | Wt 155.0 lb

## 2024-08-16 DIAGNOSIS — D509 Iron deficiency anemia, unspecified: Secondary | ICD-10-CM

## 2024-08-16 DIAGNOSIS — R112 Nausea with vomiting, unspecified: Secondary | ICD-10-CM

## 2024-08-16 DIAGNOSIS — K219 Gastro-esophageal reflux disease without esophagitis: Secondary | ICD-10-CM

## 2024-08-16 DIAGNOSIS — K269 Duodenal ulcer, unspecified as acute or chronic, without hemorrhage or perforation: Secondary | ICD-10-CM | POA: Diagnosis not present

## 2024-08-16 DIAGNOSIS — K581 Irritable bowel syndrome with constipation: Secondary | ICD-10-CM

## 2024-08-16 DIAGNOSIS — Z860101 Personal history of adenomatous and serrated colon polyps: Secondary | ICD-10-CM

## 2024-08-16 DIAGNOSIS — R1110 Vomiting, unspecified: Secondary | ICD-10-CM

## 2024-08-16 NOTE — Progress Notes (Unsigned)
 Ellouise Console, PA-C [address] [place], [place]  [address] Phone: [phone]   Primary Care Physician: Tower, Laine LABOR, MD  Primary Gastroenterologist:  Ellouise Console, PA-C / Dr. Gordy Starch   Chief Complaint: Follow-up iron  deficiency anemia and multiple chronic GI symptoms.      HPI:   Discussed the use of AI scribe software for clinical note transcription with the patient, who gave verbal consent to proceed.  She is here today with her husband.  She is wheelchair dependent.  History of Present Illness [name] Branch is a 68 year old female with iron  deficiency anemia who presents with ongoing nausea and vomiting.  She has a history of iron  deficiency anemia, evaluated with a colonoscopy and endoscopy in 2023, revealing gastritis, duodenal ulcers, and colon polyps. She uses pantoprazole , Zofran , and Phenergan  for her gastrointestinal symptoms. Her hemoglobin levels improved following multiple iron  infusions, with the last infusion in July 2025. Her hemoglobin was 12.7 on August 04, 2024, up from 7.4 in April 2025.  She experiences chronic nausea and vomiting, described as constant and worsening over time. Nausea is persistent, while vomiting is intermittent. Symptoms have been present for most of her life, increasing in severity with age. No current diarrhea, but she had an episode of vomiting and diarrhea last Friday, possibly food-related. She has not undergone a gastric emptying study for gastroparesis.  She experiences constipation, sometimes going up to ten days without a bowel movement without medication. She uses stool softeners, Miralax , and probiotics to manage bowel movements. Occasionally, she notices bright red blood in her stool, attributed to constipation. No black stools.  She has a history of cystocele surgery and recurrent E. coli UTIs. She suspects these may be related to a rectocele. She is concerned about limited antibiotic options due to resistance. No  bowel incontinence but notes urgency and occasional difficulty reaching the bathroom in time. She has not been using fiber supplements but maintains a healthy diet.  She reports progressive dental issues, leading to decay and loss, and frequent blood in nasal discharge, though no epistaxis. She has a history of gallbladder removal and denies current abdominal pain except for discomfort related to a recent rib injury.  2023 last EGD/colonoscopy: showed polyps, gastritis, duodenal ulcers. She is well-maintained on iron  and had multiple negative Hemoccults.  She is on pantoprazole  40 Mg twice daily for her history of GERD and duodenal ulcers.   She last saw Nestor Blower, PA-C 11/2023.  History of chronic vomiting, GERD, hepatic steatosis, IBS, and anemia.  She is on Phenergan  long-term for chronic vomiting. Her primary care gave her Zofran  which she feels does not work as well.    Current Outpatient Medications  Medication Sig Dispense Refill   Ascorbic Acid (VITAMIN C GUMMIE PO) Take 1 capsule by mouth daily.     Cholecalciferol  (VITAMIN D ) 125 MCG (5000 UT) CAPS Take 5,000 Units by mouth daily.     ciprofloxacin  (CIPRO ) 250 MG tablet Take 1 tablet (250 mg total) by mouth 2 (two) times daily. 6 tablet 0   Cranberry-Vitamin C-Probiotic (AZO CRANBERRY) 250-30 MG TABS Take 1 capsule by mouth daily as needed.     docusate sodium  (COLACE) 100 MG capsule Take 1 capsule (100 mg total) by mouth 2 (two) times daily. 60 capsule 0   Garlic 1000 MG CAPS Take 1 capsule by mouth daily.     HM MAGNESIUM  CITRATE PO Take 2 tablets by mouth daily.  Multiple Vitamin (MULTIVITAMIN) tablet Take 1 tablet by mouth daily.     Multiple Vitamins-Minerals (HAIR SKIN AND NAILS FORMULA PO) Take 1 tablet by mouth daily.     nystatin  (MYCOSTATIN ) 100000 UNIT/ML suspension Take 5 mLs (500,000 Units total) by mouth in the morning, at noon, and at bedtime. 120 mL 1   ondansetron  (ZOFRAN -ODT) 8 MG disintegrating tablet  DISSOLVE 1 TABLET ON THE TONGUE EVERY 8 HOURS AS NEEDED FOR NAUSEA 60 tablet 1   pantoprazole  (PROTONIX ) 40 MG tablet Take 1 tablet (40 mg total) by mouth daily. 30 tablet 4   polyethylene glycol (MIRALAX  / GLYCOLAX ) 17 g packet Take 17 g by mouth daily.     promethazine  (PHENERGAN ) 25 MG tablet Take 1 tablet (25 mg total) by mouth every 8 (eight) hours as needed. 45 tablet 11   vitamin B-12 (CYANOCOBALAMIN ) 1000 MCG tablet Place 1,000 mcg under the tongue daily.     No current facility-administered medications for this visit.    Allergies as of 08/16/2024 - Review Complete 08/16/2024  Allergen Reaction Noted   Other Nausea And Vomiting 10/26/1969   Sulfonamide derivatives Nausea And Vomiting     Past Medical History:  Diagnosis Date   Allergic rhinitis    Allergy 1975   Anemia 2023   Arthritis 2008   Chronic fatigue syndrome    CFIDs; also chronic joint and mucle pain (possible tik bourne illness?). Dr. Mac    Chronic gastritis    Dyspnea    GERD (gastroesophageal reflux disease) 2023   Hepatic steatosis    History of kidney stones 05/2017   Hyperlipidemia    IBS (irritable bowel syndrome)    Lyme disease    stari   Migraine    Muscle twitching    Neuromuscular disorder (HCC) 1995   M.E./CFIDS   Pneumonia    PONV (postoperative nausea and vomiting)    Pre-diabetes    Rectal prolapse    Schatzki's ring    Ulcer 2023   Umbilical hernia    Vaginal prolapse    West Nile Virus infection 2003    Past Surgical History:  Procedure Laterality Date   5th R toe fx  '09   ABDOMINAL HYSTERECTOMY  1982   Severe menstrual cycles, retrograde menstrual flow, inability to pregnancies to term/miscarriages   BREAST BIOPSY  '04   neg; right   CHOLECYSTECTOMY  10/2011   COLONOSCOPY  12/02/2021   2017- 2- TAs   CYSTOSCOPY WITH STENT PLACEMENT Right 05/25/2017   Procedure: CYSTOSCOPY WITH STENT PLACEMENT;  Surgeon: Penne Knee, MD;  Location: [facility];  Service: Urology;   Laterality: Right;   CYSTOSCOPY WITH STENT PLACEMENT Right 06/21/2017   Procedure: CYSTOSCOPY WITH STENT EXCHANGE;  Surgeon: Penne Knee, MD;  Location: [facility];  Service: Urology;  Laterality: Right;   DIAGNOSTIC LAPAROSCOPY     EXTRACORPOREAL SHOCK WAVE LITHOTRIPSY Right 06/10/2017   Procedure: EXTRACORPOREAL SHOCK WAVE LITHOTRIPSY (ESWL);  Surgeon: Penne Knee, MD;  Location: [facility];  Service: Urology;  Laterality: Right;   HOLMIUM LASER APPLICATION Right 06/21/2017   Procedure: HOLMIUM LASER APPLICATION;  Surgeon: Penne Knee, MD;  Location: [facility];  Service: Urology;  Laterality: Right;   laprascopy     PARTIAL HYSTERECTOMY     bleeding   ROBOTIC ASSISTED LAPAROSCOPIC SACROCOLPOPEXY N/A 09/25/2022   Procedure: XI ROBOTIC ASSISTED LAPAROSCOPIC SACROCOLPOPEXY; BILATERAL SALPINGOOPHERECTOMY;  Surgeon: Cam Morene ORN, MD;  Location: [facility];  Service: Urology;  Laterality: N/A;  240 MINUTES NEEDED FOR CASE  SPINE SURGERY  2008?   TONSILLECTOMY     TONSILLECTOMY     ULNAR NERVE REPAIR     sx times 5   UPPER GASTROINTESTINAL ENDOSCOPY  12/04/2004   gastritis   URETEROSCOPY WITH HOLMIUM LASER LITHOTRIPSY Right 06/21/2017   Procedure: URETEROSCOPY WITH HOLMIUM LASER LITHOTRIPSY;  Surgeon: Penne Knee, MD;  Location: [facility];  Service: Urology;  Laterality: Right;    Review of Systems:    All systems reviewed and negative except where noted in HPI.    Physical Exam:  BP (!) 142/72 (BP Location: Left Arm, Patient Position: Sitting, Cuff Size: Normal)   Pulse (!) 115   Ht 5' 6 (1.676 m)   Wt 155 lb (70.3 kg) Comment: Patient stated  BMI 25.02 kg/m  No LMP recorded. Patient has had a hysterectomy.  General: Chronically ill-appearing female, wheelchair dependent, in no acute distress.  Lungs: Clear to auscultation bilaterally. Non-labored. Heart: Regular rate and rhythm, no murmurs rubs or gallops.  Abdomen: Bowel sounds are normal; Abdomen is Soft and  ovese; No hepatosplenomegaly, masses or hernias;  No Abdominal Tenderness; No guarding or rebound tenderness. Neuro: Alert and oriented x 3.  Grossly intact.  Psych: Alert and cooperative, normal mood and affect.   Imaging Studies: No results found.  Labs: CBC    Component Value Date/Time   WBC 3.3 (L) 08/04/2024 1105   WBC 5.8 02/23/2024 1307   RBC 4.52 08/04/2024 1105   RBC 4.56 08/04/2024 1105   HGB 12.7 08/04/2024 1105   HGB 10.3 (L) 09/01/2022 1608   HCT 39.5 08/04/2024 1105   HCT 30.9 (L) 09/01/2022 1608   PLT 168 08/04/2024 1105   PLT 192 09/01/2022 1608   MCV 86.6 08/04/2024 1105   MCV 73 (L) 09/01/2022 1608   MCV 82 11/17/2011 0249   MCH 27.9 08/04/2024 1105   MCHC 32.2 08/04/2024 1105   RDW 14.1 08/04/2024 1105   RDW 14.6 09/01/2022 1608   RDW 14.0 11/17/2011 0249   LYMPHSABS 1.1 08/04/2024 1105   LYMPHSABS 1.1 09/01/2022 1608   LYMPHSABS 1.2 11/17/2011 0249   MONOABS 0.4 08/04/2024 1105   MONOABS 0.7 11/17/2011 0249   EOSABS 0.1 08/04/2024 1105   EOSABS 0.1 09/01/2022 1608   EOSABS 0.2 11/17/2011 0249   BASOSABS 0.0 08/04/2024 1105   BASOSABS 0.0 09/01/2022 1608   BASOSABS 0.0 11/17/2011 0249    CMP     Component Value Date/Time   NA 137 06/20/2024 1610   NA 139 09/01/2022 1608   NA 136 11/17/2011 0249   K 4.1 06/20/2024 1610   K 3.8 11/17/2011 0249   CL 101 06/20/2024 1610   CL 97 (L) 11/17/2011 0249   CO2 25 06/20/2024 1610   CO2 25 11/17/2011 0249   GLUCOSE 97 06/20/2024 1610   GLUCOSE 103 (H) 11/17/2011 0249   BUN 9 06/20/2024 1610   BUN 8 09/01/2022 1608   BUN 4 (L) 11/17/2011 0249   CREATININE 0.47 06/20/2024 1610   CREATININE 0.55 (L) 11/17/2011 0249   CALCIUM 9.3 06/20/2024 1610   CALCIUM 8.8 11/17/2011 0249   PROT 7.0 06/20/2024 1610   PROT 6.9 09/01/2022 1608   PROT 7.2 11/16/2011 0658   ALBUMIN 3.5 06/20/2024 1610   ALBUMIN 3.5 (L) 09/01/2022 1608   ALBUMIN 2.9 (L) 11/16/2011 0658   AST 51 (H) 06/20/2024 1610   AST 34  11/16/2011 0658   ALT 26 06/20/2024 1610   ALT 34 11/16/2011 0658   ALKPHOS 108 06/20/2024 1610  ALKPHOS 84 11/16/2011 0658   BILITOT 2.0 (H) 06/20/2024 1610   BILITOT 1.2 09/01/2022 1608   BILITOT 0.9 11/16/2011 0658   GFRNONAA >60 04/26/2023 0223   GFRNONAA >60 11/17/2011 0249   GFRAA >60 07/20/2020 2021   GFRAA >60 11/17/2011 0249       Assessment and Plan:   Azhar Yogi is a 68 y.o. y/o female who presents for follow-up of multiple chronic GI issues including chronic iron  deficiency anemia, gastritis, duodenal ulcers, history of chronic nausea, history of colon polyps, GERD, and IBS. Assessment & Plan 1.  Iron  deficiency anemia with history of gastrointestinal bleeding and recent improvement post-infusion Iron  deficiency anemia improved post-infusion with hemoglobin at 12.7 g/dL. Previous endoscopy and colonoscopy showed gastritis, duodenal ulcers, and colon polyps without active bleeding. No current GI bleeding signs. - Discuss with Doctor Pyrtle about anemia-related GI procedures. - Consider repeat upper endoscopy and capsule endoscopy if necessary. -I would not recommend repeat colonoscopy current given her current health status and probably likely low yield given fairly recent colonoscopy 2023.  2.  Chronic nausea and vomiting.  Previous cholecystectomy.  Gastroparesis is in the differential. -I discussed scheduling gastric emptying study, however patient declined because she has multiple other doctor appointments currently.   - Consider gastric emptying study if symptoms worsen. - Continue antiemetic regimen with Zofran  and Phenergan .  3.  Irritable bowel syndrome with chronic constipation and irregular bowel movements. - Continue current regimen including stool softeners, Miralax , and probiotics. - Recommend trying Benefiber once a day.  4.  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 coli .  Patient is concerned about contributing rectocele.  We discussed  referral to colorectal surgeon to discuss repair.  She had recent cystocele surgery by urogyn - Recurrent E. coli UTIs possibly related to rectocele. Concerns about antibiotic resistance. - Follow up with urologist to discuss rectocele and recurrent UTIs. - Patient declined referral to colorectal surgeon today, however she will let me know if she changes her mind.  Consider referral to [facility] Dr. Debby Pizza or Bernarda Debby.  5.  GERD, gastritis and duodenal ulcers managed with PPI.  Asymptomatic.  No current abdominal pain. - Continue pantoprazole  40 mg once or twice daily.  Recommend lowest effective PPI dose necessary. - Avoid NSAIDs like ibuprofen , Advil , Aleve, Motrin , Goody powders, and BC powders.   Ellouise Console, PA-C  Follow up with Dr. Albertus in 3 months.

## 2024-08-16 NOTE — Patient Instructions (Addendum)
   Start OTC Benefiber Powder. Mix 1 - 2 Tablespoons in 6 - 8 ounces of a Drink Once Daily. Drink 64 ounces of water  / fluids Daily.   Please follow up sooner if symptoms increase or worsen  Due to recent changes in healthcare laws, you may see the results of your imaging and laboratory studies on MyChart before your provider has had a chance to review them.  We understand that in some cases there may be results that are confusing or concerning to you. Not all laboratory results come back in the same time frame and the provider may be waiting for multiple results in order to interpret others.  Please give us  48 hours in order for your provider to thoroughly review all the results before contacting the office for clarification of your results.   Thank you for trusting me with your gastrointestinal care!   Ellouise Console, PA-C _______________________________________________________  If your blood pressure at your visit was 140/90 or greater, please contact your primary care physician to follow up on this.  _______________________________________________________  If you are age 47 or older, your body mass index should be between 23-30. Your Body mass index is 25.02 kg/m. If this is out of the aforementioned range listed, please consider follow up with your Primary Care Provider.  If you are age 46 or younger, your body mass index should be between 19-25. Your Body mass index is 25.02 kg/m. If this is out of the aformentioned range listed, please consider follow up with your Primary Care Provider.   ________________________________________________________  The [facility] providers would like to encourage you to use MYCHART to communicate with providers for non-urgent requests or questions.  Due to long hold times on the telephone, sending your provider a message by [facility] may be a faster and more efficient way to get a response.  Please allow 48 business hours for a response.  Please remember  that this is for non-urgent requests.  _______________________________________________________

## 2024-08-17 ENCOUNTER — Encounter: Payer: Self-pay | Admitting: Physician Assistant

## 2024-08-21 NOTE — Progress Notes (Signed)
 Addendum: Reviewed and agree with assessment and management plan. Would repeat EGD given symptoms, hx of PUD and her IDA. Avarae Zwart, Gordy HERO, MD

## 2024-08-23 NOTE — Progress Notes (Signed)
 Pt scheduled for previsit over the phone 11/*19/25 at 4pm, egd scheduled In the lec 09/26/24@9am .

## 2024-09-11 ENCOUNTER — Encounter: Payer: Self-pay | Admitting: Family Medicine

## 2024-09-11 ENCOUNTER — Ambulatory Visit: Admitting: Family Medicine

## 2024-09-11 ENCOUNTER — Ambulatory Visit (INDEPENDENT_AMBULATORY_CARE_PROVIDER_SITE_OTHER)
Admission: RE | Admit: 2024-09-11 | Discharge: 2024-09-11 | Disposition: A | Discharge status: Home / Self Care | Source: Ambulatory Visit | Attending: Family Medicine | Admitting: Family Medicine

## 2024-09-11 ENCOUNTER — Ambulatory Visit: Payer: Self-pay | Admitting: Family Medicine

## 2024-09-11 VITALS — BP 122/60 | HR 102 | Temp 98.2°F | Ht 66.0 in

## 2024-09-11 DIAGNOSIS — R0789 Other chest pain: Secondary | ICD-10-CM

## 2024-09-11 DIAGNOSIS — Z23 Encounter for immunization: Secondary | ICD-10-CM | POA: Diagnosis not present

## 2024-09-11 DIAGNOSIS — K029 Dental caries, unspecified: Secondary | ICD-10-CM | POA: Diagnosis not present

## 2024-09-11 DIAGNOSIS — G9332 Myalgic encephalomyelitis/chronic fatigue syndrome: Secondary | ICD-10-CM

## 2024-09-11 DIAGNOSIS — R1110 Vomiting, unspecified: Secondary | ICD-10-CM | POA: Diagnosis not present

## 2024-09-11 DIAGNOSIS — K581 Irritable bowel syndrome with constipation: Secondary | ICD-10-CM | POA: Diagnosis not present

## 2024-09-11 DIAGNOSIS — N390 Urinary tract infection, site not specified: Secondary | ICD-10-CM

## 2024-09-11 NOTE — Assessment & Plan Note (Signed)
 Pt did discuss role of rectocele in this  Was not strongly to consider surgery  Treatment for IBS may help  Continues keflex  250 mg daily for prophylaxis

## 2024-09-11 NOTE — Assessment & Plan Note (Signed)
 With rectocele  Worsening chronic utis   Reviewed GI notes Discussed use of over the counter remedies for stool changes

## 2024-09-11 NOTE — Assessment & Plan Note (Signed)
 Planning meeting with oral surgeon  Will need all teeth extracted  Imagine this will eventually help malaise and sense of well being, possibly GI issues also  Discussed impact of chronic dental decay on other body systems  May have low level of infection all the time

## 2024-09-11 NOTE — Assessment & Plan Note (Signed)
 Reviewed GI notes  Considering EGD and gastric emptying study  Also needs extensive dental work (extracting all teeth) -which after healing may help also

## 2024-09-11 NOTE — Progress Notes (Signed)
 Subjective:    Patient ID: [name] Branch, female    DOB: 04/15/1956, 68 y.o.   MRN: [id number]  HPI  Wt Readings from Last 3 Encounters:  08/16/24 155 lb (70.3 kg)  06/20/24 155 lb (70.3 kg)  06/07/24 162 lb (73.5 kg)   25.02 kg/m  Vitals:   09/11/24 1449 09/11/24 1526  BP: (!) 142/50 122/60  Pulse: (!) 102   Temp: 98.2 F (36.8 C)   SpO2: 99%    Pt presents for c/o Chest injury  Follow up of chronic issues /dental/ GI /urinary   3 weeks ago  Reaching forward and twisting - heard a pop in her chest / right side Some pain (severe at first)  Then during deep breath  Feels a little bit more short of breath   Started taking an expectorant  Coughs up some clear phlegm No wheezing  Gets panicky at night occational- breathing  Pulse ox 94% lowest   Not feeling well overall  ? Needs to drink more fluids    Review chronic medical problems    Dental status  Teeth are crumbling  Wants to have extractions before any other procedures  Oral surgeon next wednesday   Fatty liver Lab Results  Component Value Date   ALT 26 06/20/2024   AST 51 (H) 06/20/2024   ALKPHOS 108 06/20/2024   BILITOT 2.0 (H) 06/20/2024   In talks about Gastric emptying study / EGD Discussed possibly rectal surgeon for rectocele-not highly recommended   Temperature fluctuates  Is normal here today    DG Chest 2 View Result Date: 09/11/2024 CLINICAL DATA:  Right chest pain after injury. EXAM: CHEST - 2 VIEW COMPARISON:  Chest x-ray 11/26/2021. FINDINGS: The heart size and mediastinal contours are within normal limits. Both lungs are clear. The visualized skeletal structures are unremarkable. IMPRESSION: No active cardiopulmonary disease. Electronically Signed   By: Greig Pique M.D.   On: 09/11/2024 16:47     Patient Active Problem List   Diagnosis Date Noted   Chest wall pain 09/11/2024   Dental decay 01/18/2024   Vaccine counseling 01/18/2024   Current use of proton pump  inhibitor 06/09/2023   Scoliosis 06/09/2023   Iron  deficiency 06/09/2023   Nausea 06/09/2023   Fatty liver 09/09/2022   IDA (iron  deficiency anemia) 09/04/2022   Pre-operative cardiovascular examination 09/01/2022   Gastritis 02/10/2022   Female bladder prolapse 02/10/2022   Encounter for screening mammogram for breast cancer 02/10/2022   Other chronic pain    History of compression fracture of spine 08/28/2020   Mixed stress and urge urinary incontinence 10/29/2017   Frequent UTI 02/18/2016   Tremor 11/12/2015   Cystocele, unspecified 04/12/2015   Routine general medical examination at a health care facility 06/26/2014   Osteoporosis 06/26/2014   Prediabetes 06/26/2014   Colon cancer screening 06/26/2014   Intertrigo 07/12/2013   Chronic fatigue syndrome 01/19/2013   Elevated alkaline phosphatase level 09/19/2012   Chronic vomiting 08/05/2011   Tachycardia 08/05/2011   FREQUENCY, URINARY 08/06/2010   CONSTIPATION, CHRONIC 07/30/2008   Irritable bowel syndrome 07/30/2008   Pure hypercholesterolemia 06/21/2008   POSTMENOPAUSAL STATUS 06/21/2008   VAGINITIS, ATROPHIC 06/21/2008   Past Medical History:  Diagnosis Date   Allergic rhinitis    Allergy 1975   Anemia 2023   Arthritis 2008   Chronic fatigue syndrome    CFIDs; also chronic joint and mucle pain (possible tik bourne illness?). Dr. Mac    Chronic gastritis    Dyspnea  GERD (gastroesophageal reflux disease) 2023   Hepatic steatosis    History of kidney stones 05/2017   Hyperlipidemia    IBS (irritable bowel syndrome)    Lyme disease    stari   Migraine    Muscle twitching    Neuromuscular disorder (HCC) 1995   M.E./CFIDS   Pneumonia    PONV (postoperative nausea and vomiting)    Pre-diabetes    Rectal prolapse    Schatzki's ring    Ulcer 2023   Umbilical hernia    Vaginal prolapse    West Nile Virus infection 2003   Past Surgical History:  Procedure Laterality Date   5th R toe fx  '09    ABDOMINAL HYSTERECTOMY  1982   Severe menstrual cycles, retrograde menstrual flow, inability to pregnancies to term/miscarriages   BREAST BIOPSY  '04   neg; right   CHOLECYSTECTOMY  10/2011   COLONOSCOPY  12/02/2021   2017- 2- TAs   CYSTOSCOPY WITH STENT PLACEMENT Right 05/25/2017   Procedure: CYSTOSCOPY WITH STENT PLACEMENT;  Surgeon: Penne Knee, MD;  Location: [facility];  Service: Urology;  Laterality: Right;   CYSTOSCOPY WITH STENT PLACEMENT Right 06/21/2017   Procedure: CYSTOSCOPY WITH STENT EXCHANGE;  Surgeon: Penne Knee, MD;  Location: [facility];  Service: Urology;  Laterality: Right;   DIAGNOSTIC LAPAROSCOPY     EXTRACORPOREAL SHOCK WAVE LITHOTRIPSY Right 06/10/2017   Procedure: EXTRACORPOREAL SHOCK WAVE LITHOTRIPSY (ESWL);  Surgeon: Penne Knee, MD;  Location: [facility];  Service: Urology;  Laterality: Right;   HOLMIUM LASER APPLICATION Right 06/21/2017   Procedure: HOLMIUM LASER APPLICATION;  Surgeon: Penne Knee, MD;  Location: [facility];  Service: Urology;  Laterality: Right;   laprascopy     PARTIAL HYSTERECTOMY     bleeding   ROBOTIC ASSISTED LAPAROSCOPIC SACROCOLPOPEXY N/A 09/25/2022   Procedure: XI ROBOTIC ASSISTED LAPAROSCOPIC SACROCOLPOPEXY; BILATERAL SALPINGOOPHERECTOMY;  Surgeon: Cam Morene ORN, MD;  Location: [facility];  Service: Urology;  Laterality: N/A;  240 MINUTES NEEDED FOR CASE   SPINE SURGERY  2008?   TONSILLECTOMY     TONSILLECTOMY     ULNAR NERVE REPAIR     sx times 5   UPPER GASTROINTESTINAL ENDOSCOPY  12/04/2004   gastritis   URETEROSCOPY WITH HOLMIUM LASER LITHOTRIPSY Right 06/21/2017   Procedure: URETEROSCOPY WITH HOLMIUM LASER LITHOTRIPSY;  Surgeon: Penne Knee, MD;  Location: [facility];  Service: Urology;  Laterality: Right;   Social History   Tobacco Use   Smoking status: Never   Smokeless tobacco: Never   Tobacco comments:    non smoker  Vaping Use   Vaping status: Never Used  Substance Use Topics   Alcohol use: No    Drug use: Never   Family History  Problem Relation Age of Onset   Hypertension Mother    Cancer Mother 69 - 98       Waldenstroms   Ulcers Mother    Arthritis Mother 71 - 60   Varicose Veins Mother 5 - 66   Diabetes Father    Hypertension Father    Stroke Father 11 - 96       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  Required surgery.   Cancer Father    Throat cancer Father 45 - 99       Diagnosed shortly before he died. Cause of death was dizziness from one of the medications (or combination) that led to a fall down a flight of stairs.  Died in ICU next day.   Bladder  Cancer Father    Skin cancer Father 16 - 65       Had areas removed at various times.  Was out in sun a lot but after diagnosed began religiously using sunscreen, wore a hat and sunglasses.   High Cholesterol Father 36 - 48   Breast cancer Sister    Liver disease Sister 43 - 51       Fatty liver   Obesity Sister 18 - 63       Was thin until menopause, then gradually gained a lot of weight. She's also on disability & gets a lot of food banks.  The free food is not usually very healthy.   Cerebral aneurysm Maternal Grandmother 30 09/28/2038       Died from the aneurysm.   Alcohol abuse Maternal Grandfather        Served in [employer] & saw action which no doubt caused or worsened his drinking.   Cancer Maternal Grandfather        Unsure when diagnosed or type of cancer but it was his cause of death.   Breast cancer Paternal Grandmother 32 - 4       She died from this cancer.   Lung cancer Paternal Grandfather 60 - 70       He died from this cancer.   Bipolar disorder Other        some family   Breast cancer Sister 9 - 68   Skin cancer Brother 68 2068/09/28       Several surgeries to remove places on face.   Colon cancer Neg Hx    Esophageal cancer Neg Hx    Pancreatic cancer Neg Hx    Ulcerative colitis Neg Hx    Allergies  Allergen Reactions   Other Nausea And Vomiting    All Antibiotics   Sulfonamide  Derivatives Nausea And Vomiting   Current Outpatient Medications on File Prior to Visit  Medication Sig Dispense Refill   Ascorbic Acid (VITAMIN C GUMMIE PO) Take 1 capsule by mouth daily.     cephALEXin  (KEFLEX ) 250 MG capsule Take 250 mg by mouth at bedtime.     Cholecalciferol  (VITAMIN D ) 125 MCG (5000 UT) CAPS Take 5,000 Units by mouth daily.     Cranberry-Vitamin C-Probiotic (AZO CRANBERRY) 250-30 MG TABS Take 1 capsule by mouth daily as needed.     docusate sodium  (COLACE) 100 MG capsule Take 1 capsule (100 mg total) by mouth 2 (two) times daily. 60 capsule 0   Garlic 1000 MG CAPS Take 1 capsule by mouth daily.     HM MAGNESIUM  CITRATE PO Take 2 tablets by mouth daily.     Multiple Vitamin (MULTIVITAMIN) tablet Take 1 tablet by mouth daily.     Multiple Vitamins-Minerals (HAIR SKIN AND NAILS FORMULA PO) Take 1 tablet by mouth daily.     nystatin  (MYCOSTATIN ) 100000 UNIT/ML suspension Take 5 mLs (500,000 Units total) by mouth in the morning, at noon, and at bedtime. 120 mL 1   ondansetron  (ZOFRAN -ODT) 8 MG disintegrating tablet DISSOLVE 1 TABLET ON THE TONGUE EVERY 8 HOURS AS NEEDED FOR NAUSEA 60 tablet 1   pantoprazole  (PROTONIX ) 40 MG tablet Take 1 tablet (40 mg total) by mouth daily. 30 tablet 4   polyethylene glycol (MIRALAX  / GLYCOLAX ) 17 g packet Take 17 g by mouth daily.     promethazine  (PHENERGAN ) 25 MG tablet Take 1 tablet (25 mg total) by mouth every 8 (eight) hours as needed. 45  tablet 11   vitamin B-12 (CYANOCOBALAMIN ) 1000 MCG tablet Place 1,000 mcg under the tongue daily.     No current facility-administered medications on file prior to visit.    Review of Systems  Constitutional:  Positive for fatigue. Negative for activity change, appetite change, fever and unexpected weight change.  HENT:  Positive for dental problem. Negative for congestion, ear pain, rhinorrhea, sinus pressure and sore throat.   Eyes:  Negative for pain, redness and visual disturbance.   Respiratory:  Positive for cough and shortness of breath. Negative for choking, chest tightness and wheezing.   Cardiovascular:  Positive for chest pain. Negative for palpitations and leg swelling.  Gastrointestinal:  Negative for abdominal pain, blood in stool, constipation and diarrhea.  Endocrine: Negative for polydipsia and polyuria.  Genitourinary:  Positive for dysuria. Negative for frequency and urgency.  Musculoskeletal:  Negative for arthralgias, back pain and myalgias.  Skin:  Negative for pallor and rash.  Allergic/Immunologic: Negative for environmental allergies.  Neurological:  Negative for dizziness, syncope and headaches.  Hematological:  Negative for adenopathy. Does not bruise/bleed easily.  Psychiatric/Behavioral:  Negative for decreased concentration and dysphoric mood. The patient is not nervous/anxious.        Objective:   Physical Exam Constitutional:      General: She is not in acute distress.    Appearance: Normal appearance. She is well-developed. She is obese. She is not ill-appearing or diaphoretic.     Comments: Chronically ill appearing  Holding emesis bag due to nausea   HENT:     Head: Normocephalic and atraumatic.     Mouth/Throat:     Comments: Poor dentition Broken tooth in front/upper  Eyes:     Conjunctiva/sclera: Conjunctivae normal.     Pupils: Pupils are equal, round, and reactive to light.  Neck:     Thyroid : No thyromegaly.     Vascular: No carotid bruit or JVD.  Cardiovascular:     Rate and Rhythm: Normal rate and regular rhythm.     Heart sounds: Normal heart sounds.     No gallop.  Pulmonary:     Effort: Pulmonary effort is normal. No respiratory distress.     Breath sounds: Normal breath sounds. No stridor. No wheezing, rhonchi or rales.     Comments: Tender right anterolateral ribs to palpation  No crepitus No step off   Bs are symmetric and full  Chest:     Chest wall: Tenderness present.  Abdominal:     General: There  is no distension or abdominal bruit.     Palpations: Abdomen is soft.  Musculoskeletal:     Cervical back: Normal range of motion and neck supple.     Right lower leg: No edema.     Left lower leg: No edema.  Lymphadenopathy:     Cervical: No cervical adenopathy.  Skin:    General: Skin is warm and dry.     Coloration: Skin is not pale.     Findings: No rash.  Neurological:     Mental Status: She is alert.     Coordination: Coordination normal.     Deep Tendon Reflexes: Reflexes are normal and symmetric. Reflexes normal.  Psychiatric:        Mood and Affect: Mood normal.           Assessment & Plan:   Problem List Items Addressed This Visit       Digestive   Irritable bowel syndrome   With rectocele  Worsening  chronic utis   Reviewed GI notes Discussed use of over the counter remedies for stool changes       Dental decay   Planning meeting with oral surgeon  Will need all teeth extracted  Imagine this will eventually help malaise and sense of well being, possibly GI issues also  Discussed impact of chronic dental decay on other body systems  May have low level of infection all the time       Relevant Medications   cephALEXin  (KEFLEX ) 250 MG capsule   Chronic vomiting   Reviewed GI notes  Considering EGD and gastric emptying study  Also needs extensive dental work (extracting all teeth) -which after healing may help also          Nervous and Auditory   Chronic fatigue syndrome   I do wonder of having teeth extracted will help this         Genitourinary   Frequent UTI   Pt did discuss role of rectocele in this  Was not strongly to consider surgery  Treatment for IBS may help  Continues keflex  250 mg daily for prophylaxis       Relevant Medications   cephALEXin  (KEFLEX ) 250 MG capsule     Other   Chest wall pain - Primary   Right anterolateral pain after reaching for something and feeling a pop Followed by pain/ some pleuritic pain , but that  hs gradually improved Gets panicky at night- with sensation of shortness of breath also  Reassuring exam Cxr clear (reassuring)  Could not get rib film due to inability to stand unsupported   Discussed importance of taking good deep breaths to prevent atelectasis Watch for increase pain or painful breathing, cough or fever Will continue to monitor  If needed, would plan rib films at outside imaging center  Call back and Er precautions noted in detail today        Relevant Orders   DG Chest 2 View (Completed)   Other Visit Diagnoses       Need for influenza vaccination       Relevant Orders   Flu vaccine HIGH DOSE PF(Fluzone Trivalent) (Completed)

## 2024-09-11 NOTE — Assessment & Plan Note (Signed)
 Right anterolateral pain after reaching for something and feeling a pop Followed by pain/ some pleuritic pain , but that hs gradually improved Gets panicky at night- with sensation of shortness of breath also  Reassuring exam Cxr clear (reassuring)  Could not get rib film due to inability to stand unsupported   Discussed importance of taking good deep breaths to prevent atelectasis Watch for increase pain or painful breathing, cough or fever Will continue to monitor  If needed, would plan rib films at outside imaging center  Call back and Er precautions noted in detail today

## 2024-09-11 NOTE — Patient Instructions (Addendum)
 Xray of chest and ribs now  We will reach out with results   If symptoms worsen in the meantime please call     Follow up with the oral surgeon next   Flu shot today

## 2024-09-11 NOTE — Assessment & Plan Note (Signed)
 I do wonder of having teeth extracted will help this

## 2024-09-13 ENCOUNTER — Encounter

## 2024-09-14 ENCOUNTER — Other Ambulatory Visit: Payer: Self-pay | Admitting: Family Medicine

## 2024-09-15 NOTE — Telephone Encounter (Signed)
 Last filled on 07/13/24 #60 tab/ 1 refill  Last OV was an acute appt on 09/11/24

## 2024-09-19 ENCOUNTER — Other Ambulatory Visit: Payer: Self-pay

## 2024-09-19 ENCOUNTER — Inpatient Hospital Stay: Attending: Oncology

## 2024-09-19 DIAGNOSIS — D509 Iron deficiency anemia, unspecified: Secondary | ICD-10-CM | POA: Insufficient documentation

## 2024-09-19 DIAGNOSIS — D500 Iron deficiency anemia secondary to blood loss (chronic): Secondary | ICD-10-CM

## 2024-09-19 LAB — CBC WITH DIFFERENTIAL (CANCER CENTER ONLY)
Abs Immature Granulocytes: 0.01 K/uL (ref 0.00–0.07)
Basophils Absolute: 0.1 K/uL (ref 0.0–0.1)
Basophils Relative: 1 %
Eosinophils Absolute: 0.3 K/uL (ref 0.0–0.5)
Eosinophils Relative: 6 %
HCT: 34 % — ABNORMAL LOW (ref 36.0–46.0)
Hemoglobin: 10.7 g/dL — ABNORMAL LOW (ref 12.0–15.0)
Immature Granulocytes: 0 %
Lymphocytes Relative: 38 %
Lymphs Abs: 1.7 K/uL (ref 0.7–4.0)
MCH: 26.6 pg (ref 26.0–34.0)
MCHC: 31.5 g/dL (ref 30.0–36.0)
MCV: 84.4 fL (ref 80.0–100.0)
Monocytes Absolute: 0.5 K/uL (ref 0.1–1.0)
Monocytes Relative: 10 %
Neutro Abs: 2 K/uL (ref 1.7–7.7)
Neutrophils Relative %: 45 %
Platelet Count: 198 K/uL (ref 150–400)
RBC: 4.03 MIL/uL (ref 3.87–5.11)
RDW: 14.2 % (ref 11.5–15.5)
WBC Count: 4.5 K/uL (ref 4.0–10.5)
nRBC: 0 % (ref 0.0–0.2)

## 2024-09-19 LAB — FERRITIN: Ferritin: 9 ng/mL — ABNORMAL LOW (ref 11–307)

## 2024-09-19 LAB — RETIC PANEL
Immature Retic Fract: 17.6 % — ABNORMAL HIGH (ref 2.3–15.9)
RBC.: 4.12 MIL/uL (ref 3.87–5.11)
Retic Count, Absolute: 71.3 K/uL (ref 19.0–186.0)
Retic Ct Pct: 1.7 % (ref 0.4–3.1)
Reticulocyte Hemoglobin: 26 pg — ABNORMAL LOW (ref >27.9)

## 2024-09-19 LAB — IRON AND TIBC
Iron: 35 ug/dL (ref 28–170)
Saturation Ratios: 7 % — ABNORMAL LOW (ref 10.4–31.8)
TIBC: 491 ug/dL — ABNORMAL HIGH (ref 250–450)
UIBC: 457 ug/dL

## 2024-09-25 NOTE — Telephone Encounter (Signed)
 I would recommend the dental procedures to be done first and then GI procedure

## 2024-09-26 ENCOUNTER — Encounter: Admitting: Internal Medicine

## 2024-09-26 ENCOUNTER — Inpatient Hospital Stay: Admitting: Oncology

## 2024-09-26 ENCOUNTER — Inpatient Hospital Stay

## 2024-09-29 ENCOUNTER — Inpatient Hospital Stay: Attending: Oncology | Admitting: Oncology

## 2024-09-29 ENCOUNTER — Inpatient Hospital Stay

## 2024-09-29 ENCOUNTER — Encounter: Payer: Self-pay | Admitting: Oncology

## 2024-09-29 VITALS — BP 135/57 | HR 93 | Temp 98.5°F | Resp 18

## 2024-09-29 VITALS — BP 136/53 | HR 91

## 2024-09-29 DIAGNOSIS — D500 Iron deficiency anemia secondary to blood loss (chronic): Secondary | ICD-10-CM

## 2024-09-29 DIAGNOSIS — D509 Iron deficiency anemia, unspecified: Secondary | ICD-10-CM | POA: Diagnosis present

## 2024-09-29 MED ORDER — IRON SUCROSE 20 MG/ML IV SOLN
200.0000 mg | Freq: Once | INTRAVENOUS | Status: AC
  Administered 2024-09-29: 200 mg via INTRAVENOUS
  Filled 2024-09-29: qty 10

## 2024-09-29 NOTE — Assessment & Plan Note (Addendum)
 Labs are reviewed and discussed with patient. Lab Results  Component Value Date   HGB 10.7 (L) 09/19/2024   TIBC 491 (H) 09/19/2024   IRONPCTSAT 7 (L) 09/19/2024   FERRITIN 9 (L) 09/19/2024    Recommend Venfoer 200mg  x 5 Recommend patient to follow up with GI for evaluation of etiology of IDA

## 2024-09-29 NOTE — Progress Notes (Signed)
 Hematology/Oncology Progress note [phone] [phone]         Patient Care Team: Tower, Laine LABOR, MD as PCP - General (Family Medicine) Babara Call, MD as Consulting Physician (Hematology and Oncology)   REFERRING PROVIDER: Tower, Laine LABOR, MD  CHIEF COMPLAINTS/REASON FOR VISIT:  Anemia  ASSESSMENT & PLAN:  IDA (iron  deficiency anemia) Labs are reviewed and discussed with patient. Lab Results  Component Value Date   HGB 10.7 (L) 09/19/2024   TIBC 491 (H) 09/19/2024   IRONPCTSAT 7 (L) 09/19/2024   FERRITIN 9 (L) 09/19/2024    Recommend Venfoer 200mg  x 5 Recommend patient to follow up with GI for evaluation of etiology of IDA  Orders Placed This Encounter  Procedures   CBC with Differential (Cancer Center Only)    Standing Status:   Future    Expected Date:   01/28/2025    Expiration Date:   04/28/2025   Iron  and TIBC    Standing Status:   Future    Expected Date:   01/28/2025    Expiration Date:   04/28/2025   Ferritin    Standing Status:   Future    Expected Date:   01/28/2025    Expiration Date:   04/28/2025   Follow up in 4 months.  All questions were answered. The patient knows to call the clinic with any problems, questions or concerns.  Call Babara, MD, PhD [facility] Hematology Oncology 09/29/2024     HISTORY OF PRESENTING ILLNESS:  [name] Branch is a  68 y.o.  female with PMH listed below who was referred to me for anemia Reviewed patient's recent labs that was done.  She was found to have ianemia during pre surgery evaluation. 09/01/22 cbc showed Hb of 10.3, ferritin 11, iron  saturation 8, TIBC 462.  Reviewed patient's previous labs ordered by primary care physician's office,iron  deficiency anemia was new.  + fatigue.  She denies recent chest pain on exertion, shortness of breath on minimal exertion, pre-syncopal episodes, or palpitations She had not noticed any recent bleeding such as epistaxis, hematuria or hematochezia.   Her last  colonoscopy was 01/22/22, EGD was 12/02/21. She was found to have gastritis.   Patient had bladder prolapse surgery in December 2023.  INTERVAL HISTORY [name] Branch is a 68 y.o. female who has above history reviewed by me today presents for follow up visit for iron  deficiency anemia. Discussed the use of AI scribe software for clinical note transcription with the patient, who gave verbal consent to proceed.     She has not noticed any rectal bleeding or significant blood loss events, but she experiences daily epistaxis when blowing her nose, which has been ongoing for several years.  She has been on suppressive antibiotics for prevention of recurrent urinary tract infections.   MEDICAL HISTORY:  Past Medical History:  Diagnosis Date   Allergic rhinitis    Allergy 1975   Anemia 2023   Arthritis 2008   Chronic fatigue syndrome    CFIDs; also chronic joint and mucle pain (possible tik bourne illness?). Dr. Mac    Chronic gastritis    Dyspnea    GERD (gastroesophageal reflux disease) 2023   Hepatic steatosis    History of kidney stones 05/2017   Hyperlipidemia    IBS (irritable bowel syndrome)    Lyme disease    stari   Migraine    Muscle twitching    Neuromuscular disorder (HCC) 1995   M.E./CFIDS   Pneumonia  PONV (postoperative nausea and vomiting)    Pre-diabetes    Rectal prolapse    Schatzki's ring    Ulcer 2023   Umbilical hernia    Vaginal prolapse    West Nile Virus infection 2003    SURGICAL HISTORY: Past Surgical History:  Procedure Laterality Date   5th R toe fx  '09   ABDOMINAL HYSTERECTOMY  1982   Severe menstrual cycles, retrograde menstrual flow, inability to pregnancies to term/miscarriages   BREAST BIOPSY  '04   neg; right   CHOLECYSTECTOMY  10/2011   COLONOSCOPY  12/02/2021   2017- 2- TAs   CYSTOSCOPY WITH STENT PLACEMENT Right 05/25/2017   Procedure: CYSTOSCOPY WITH STENT PLACEMENT;  Surgeon: Penne Knee, MD;  Location: [facility];   Service: Urology;  Laterality: Right;   CYSTOSCOPY WITH STENT PLACEMENT Right 06/21/2017   Procedure: CYSTOSCOPY WITH STENT EXCHANGE;  Surgeon: Penne Knee, MD;  Location: [facility];  Service: Urology;  Laterality: Right;   DIAGNOSTIC LAPAROSCOPY     EXTRACORPOREAL SHOCK WAVE LITHOTRIPSY Right 06/10/2017   Procedure: EXTRACORPOREAL SHOCK WAVE LITHOTRIPSY (ESWL);  Surgeon: Penne Knee, MD;  Location: [facility];  Service: Urology;  Laterality: Right;   HOLMIUM LASER APPLICATION Right 06/21/2017   Procedure: HOLMIUM LASER APPLICATION;  Surgeon: Penne Knee, MD;  Location: [facility];  Service: Urology;  Laterality: Right;   laprascopy     PARTIAL HYSTERECTOMY     bleeding   ROBOTIC ASSISTED LAPAROSCOPIC SACROCOLPOPEXY N/A 09/25/2022   Procedure: XI ROBOTIC ASSISTED LAPAROSCOPIC SACROCOLPOPEXY; BILATERAL SALPINGOOPHERECTOMY;  Surgeon: Cam Morene ORN, MD;  Location: [facility];  Service: Urology;  Laterality: N/A;  240 MINUTES NEEDED FOR CASE   SPINE SURGERY  2008?   TONSILLECTOMY     TONSILLECTOMY     ULNAR NERVE REPAIR     sx times 5   UPPER GASTROINTESTINAL ENDOSCOPY  12/04/2004   gastritis   URETEROSCOPY WITH HOLMIUM LASER LITHOTRIPSY Right 06/21/2017   Procedure: URETEROSCOPY WITH HOLMIUM LASER LITHOTRIPSY;  Surgeon: Penne Knee, MD;  Location: [facility];  Service: Urology;  Laterality: Right;    SOCIAL HISTORY: Social History   Socioeconomic History   Marital status: Married    Spouse name: Not on file   Number of children: 0   Years of education: Not on file   Highest education level: Bachelor's degree (e.g., BA, AB, BS)  Occupational History   Occupation: owner  Tobacco Use   Smoking status: Never   Smokeless tobacco: Never   Tobacco comments:    non smoker  Vaping Use   Vaping status: Never Used  Substance and Sexual Activity   Alcohol use: No   Drug use: Never   Sexual activity: Not Currently    Comment: Husband has prostate cancer & I have bladder  prolapse  Other Topics Concern   Not on file  Social History Narrative   Runs a [employer]. Married, no children.    Social Drivers of Health   Financial Resource Strain: Patient Declined (09/11/2024)   Overall Financial Resource Strain (CARDIA)    Difficulty of Paying Living Expenses: Patient declined  Food Insecurity: Patient Declined (09/11/2024)   Hunger Vital Sign    Worried About Running Out of Food in the Last Year: Patient declined    Ran Out of Food in the Last Year: Patient declined  Transportation Needs: Patient Declined (09/11/2024)   PRAPARE - Administrator, Civil Service (Medical): Patient declined    Lack of Transportation (Non-Medical): Patient declined  Physical Activity: Unknown (09/11/2024)   Exercise Vital Sign    Days of Exercise per Week: Patient declined    Minutes of Exercise per Session: Not on file  Stress: Patient Declined (09/11/2024)   [employer] of Occupational Health - Occupational Stress Questionnaire    Feeling of Stress: Patient declined  Social Connections: Unknown (09/11/2024)   Social Connection and Isolation Panel    Frequency of Communication with Friends and Family: Patient declined    Frequency of Social Gatherings with Friends and Family: Patient declined    Attends Religious Services: Patient declined    Database Administrator or Organizations: Patient declined    Attends Banker Meetings: Not on file    Marital Status: Married  Intimate Partner Violence: Not At Risk (06/07/2024)   Humiliation, Afraid, Rape, and Kick questionnaire    Fear of Current or Ex-Partner: No    Emotionally Abused: No    Physically Abused: No    Sexually Abused: No    FAMILY HISTORY: Family History  Problem Relation Age of Onset   Hypertension Mother    Cancer Mother 29 - 49       Waldenstroms   Ulcers Mother    Arthritis Mother 57 - 65   Varicose Veins Mother 16 - 93   Diabetes Father    Hypertension Father     Stroke Father 31 - 58       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  Required surgery.   Cancer Father    Throat cancer Father 41 10-17-1998       Diagnosed shortly before he died. Cause of death was dizziness from one of the medications (or combination) that led to a fall down a flight of stairs.  Died in ICU next day.   Bladder Cancer Father    Skin cancer Father 60 - 43       Had areas removed at various times.  Was out in sun a lot but after diagnosed began religiously using sunscreen, wore a hat and sunglasses.   High Cholesterol Father 31 - 60   Breast cancer Sister    Liver disease Sister 49 - 67       Fatty liver   Obesity Sister 73 - 33       Was thin until menopause, then gradually gained a lot of weight. She's also on disability & gets a lot of food banks.  The free food is not usually very healthy.   Cerebral aneurysm Maternal Grandmother 30 10-17-38       Died from the aneurysm.   Alcohol abuse Maternal Grandfather        Served in [employer] & saw action which no doubt caused or worsened his drinking.   Cancer Maternal Grandfather        Unsure when diagnosed or type of cancer but it was his cause of death.   Breast cancer Paternal Grandmother 44 - 74       She died from this cancer.   Lung cancer Paternal Grandfather 54 - 47       He died from this cancer.   Bipolar disorder Other        some family   Breast cancer Sister 9 - 58   Skin cancer Brother 46 17-Oct-2068       Several surgeries to remove places on face.   Colon cancer Neg Hx    Esophageal cancer Neg Hx    Pancreatic cancer  Neg Hx    Ulcerative colitis Neg Hx     ALLERGIES:  is allergic to other and sulfonamide derivatives.  MEDICATIONS:  Current Outpatient Medications  Medication Sig Dispense Refill   Ascorbic Acid (VITAMIN C GUMMIE PO) Take 1 capsule by mouth daily.     cephALEXin  (KEFLEX ) 250 MG capsule Take 250 mg by mouth at bedtime.     Cholecalciferol  (VITAMIN D ) 125 MCG (5000 UT) CAPS Take 5,000  Units by mouth daily.     docusate sodium  (COLACE) 100 MG capsule Take 1 capsule (100 mg total) by mouth 2 (two) times daily. 60 capsule 0   HM MAGNESIUM  CITRATE PO Take 2 tablets by mouth daily.     Multiple Vitamin (MULTIVITAMIN) tablet Take 1 tablet by mouth daily.     Multiple Vitamins-Minerals (HAIR SKIN AND NAILS FORMULA PO) Take 1 tablet by mouth daily.     ondansetron  (ZOFRAN -ODT) 8 MG disintegrating tablet DISSOLVE 1 TABLET ON THE TONGUE EVERY 8 HOURS AS NEEDED FOR NAUSEA 60 tablet 3   pantoprazole  (PROTONIX ) 40 MG tablet Take 1 tablet (40 mg total) by mouth daily. 30 tablet 4   polyethylene glycol (MIRALAX  / GLYCOLAX ) 17 g packet Take 17 g by mouth daily.     promethazine  (PHENERGAN ) 25 MG tablet Take 1 tablet (25 mg total) by mouth every 8 (eight) hours as needed. 45 tablet 11   vitamin B-12 (CYANOCOBALAMIN ) 1000 MCG tablet Place 1,000 mcg under the tongue daily.     No current facility-administered medications for this visit.    Review of Systems  Constitutional:  Negative for appetite change, chills, fatigue and fever.  HENT:   Negative for hearing loss and voice change.   Eyes:  Negative for eye problems.  Respiratory:  Negative for chest tightness and cough.   Cardiovascular:  Negative for chest pain.  Gastrointestinal:  Positive for nausea. Negative for abdominal distention, abdominal pain and blood in stool.  Endocrine: Negative for hot flashes.  Genitourinary:  Negative for difficulty urinating and frequency.   Musculoskeletal:  Negative for arthralgias.  Skin:  Negative for itching and rash.  Neurological:  Negative for extremity weakness.  Hematological:  Negative for adenopathy.  Psychiatric/Behavioral:  Negative for confusion.     PHYSICAL EXAMINATION: Vitals:   09/29/24 1103  BP: (!) 135/57  Pulse: 93  Resp: 18  Temp: 98.5 F (36.9 C)   Filed Weights     Physical Exam Constitutional:      General: She is not in acute distress.    Appearance: She is  obese.  HENT:     Head: Normocephalic and atraumatic.  Eyes:     General: No scleral icterus. Cardiovascular:     Rate and Rhythm: Normal rate.  Pulmonary:     Effort: Pulmonary effort is normal. No respiratory distress.     Breath sounds: No wheezing.  Abdominal:     Palpations: Abdomen is soft.  Musculoskeletal:        General: Normal range of motion.     Cervical back: Normal range of motion and neck supple.  Skin:    General: Skin is warm.     Findings: No erythema or rash.  Neurological:     Mental Status: She is alert and oriented to person, place, and time. Mental status is at baseline.  Psychiatric:        Mood and Affect: Mood normal.      LABORATORY DATA:  I have reviewed the data as listed  Latest Ref Rng & Units 09/19/2024    1:34 PM 08/04/2024   11:05 AM 05/03/2024   10:32 AM  CBC  WBC 4.0 - 10.5 K/uL 4.5  3.3  4.4   Hemoglobin 12.0 - 15.0 g/dL 89.2  87.2  88.0   Hematocrit 36.0 - 46.0 % 34.0  39.5  39.3   Platelets 150 - 400 K/uL 198  168  179       Latest Ref Rng & Units 06/20/2024    4:10 PM 02/23/2024    1:07 PM 06/09/2023    3:18 PM  CMP  Glucose 70 - 99 mg/dL 97  97  897   BUN 6 - 23 mg/dL 9  8  11    Creatinine 0.40 - 1.20 mg/dL 9.52  9.50  9.48   Sodium 135 - 145 mEq/L 137  137  137   Potassium 3.5 - 5.1 mEq/L 4.1  3.9  3.6   Chloride 96 - 112 mEq/L 101  101  103   CO2 19 - 32 mEq/L 25  27  24    Calcium 8.4 - 10.5 mg/dL 9.3  9.0  9.2   Total Protein 6.0 - 8.3 g/dL 7.0  7.2  6.5   Total Bilirubin 0.2 - 1.2 mg/dL 2.0  1.2  1.5   Alkaline Phos 39 - 117 U/L 108  133  101   AST 0 - 37 U/L 51  36  40   ALT 0 - 35 U/L 26  16  20        Component Value Date/Time   IRON  35 09/19/2024 1334   IRON  36 09/01/2022 1608   TIBC 491 (H) 09/19/2024 1334   TIBC 462 (H) 09/01/2022 1608   FERRITIN 9 (L) 09/19/2024 1334   FERRITIN 11 (L) 09/01/2022 1608   IRONPCTSAT 7 (L) 09/19/2024 1334   IRONPCTSAT 8 (LL) 09/01/2022 1608     RADIOGRAPHIC STUDIES: I  have personally reviewed the radiological images as listed and agreed with the findings in the report. DG Chest 2 View Result Date: 09/11/2024 CLINICAL DATA:  Right chest pain after injury. EXAM: CHEST - 2 VIEW COMPARISON:  Chest x-ray 11/26/2021. FINDINGS: The heart size and mediastinal contours are within normal limits. Both lungs are clear. The visualized skeletal structures are unremarkable. IMPRESSION: No active cardiopulmonary disease. Electronically Signed   By: Greig Pique M.D.   On: 09/11/2024 16:47

## 2024-09-29 NOTE — Patient Instructions (Signed)

## 2024-09-29 NOTE — Progress Notes (Signed)
 Pt here for follow up. Reports feeling fatigued

## 2024-10-05 ENCOUNTER — Inpatient Hospital Stay

## 2024-10-05 VITALS — BP 128/58 | HR 104 | Temp 98.8°F | Resp 18

## 2024-10-05 DIAGNOSIS — D500 Iron deficiency anemia secondary to blood loss (chronic): Secondary | ICD-10-CM

## 2024-10-05 DIAGNOSIS — D509 Iron deficiency anemia, unspecified: Secondary | ICD-10-CM | POA: Diagnosis not present

## 2024-10-05 MED ORDER — IRON SUCROSE 20 MG/ML IV SOLN
200.0000 mg | Freq: Once | INTRAVENOUS | Status: AC
  Administered 2024-10-05: 200 mg via INTRAVENOUS

## 2024-10-10 ENCOUNTER — Inpatient Hospital Stay

## 2024-10-11 ENCOUNTER — Inpatient Hospital Stay

## 2024-10-13 ENCOUNTER — Inpatient Hospital Stay

## 2024-10-16 ENCOUNTER — Inpatient Hospital Stay

## 2024-10-16 VITALS — BP 137/58 | HR 107 | Temp 97.8°F

## 2024-10-16 DIAGNOSIS — D509 Iron deficiency anemia, unspecified: Secondary | ICD-10-CM | POA: Diagnosis not present

## 2024-10-16 DIAGNOSIS — D500 Iron deficiency anemia secondary to blood loss (chronic): Secondary | ICD-10-CM

## 2024-10-16 MED ORDER — IRON SUCROSE 20 MG/ML IV SOLN
200.0000 mg | Freq: Once | INTRAVENOUS | Status: AC
  Administered 2024-10-16: 200 mg via INTRAVENOUS
  Filled 2024-10-16: qty 10

## 2024-10-16 MED ADMIN — Sodium Chloride Flush IV Soln 0.9%: 10 mL | NDC 8881570121

## 2024-10-16 MED FILL — Sodium Chloride Flush IV Soln 0.9%: 10.0000 mL | INTRAVENOUS | Qty: 10 | Status: AC

## 2024-10-16 NOTE — Patient Instructions (Signed)

## 2024-10-17 ENCOUNTER — Ambulatory Visit: Admitting: Family Medicine

## 2024-10-21 ENCOUNTER — Encounter: Payer: Self-pay | Admitting: Family Medicine

## 2024-10-23 ENCOUNTER — Other Ambulatory Visit: Payer: Self-pay

## 2024-10-23 DIAGNOSIS — R109 Unspecified abdominal pain: Secondary | ICD-10-CM

## 2024-10-23 DIAGNOSIS — R140 Abdominal distension (gaseous): Secondary | ICD-10-CM

## 2024-10-23 NOTE — Telephone Encounter (Signed)
 I would have patient proceed with CT abdomen pelvis with contrast for abdominal pain, distention She can take MiraLAX  up to 3 times a day every day until she starts to have a bowel movement Also okay to do a MiraLAX  purge or complete bowel prep and a not split fashion for severe constipation We will follow-up CT scan results as well JMP

## 2024-10-24 ENCOUNTER — Ambulatory Visit: Admitting: Family Medicine

## 2024-10-24 ENCOUNTER — Inpatient Hospital Stay

## 2024-10-30 ENCOUNTER — Inpatient Hospital Stay

## 2024-11-06 ENCOUNTER — Encounter

## 2024-11-07 ENCOUNTER — Inpatient Hospital Stay: Attending: Oncology

## 2024-11-07 VITALS — BP 133/60 | HR 106 | Temp 98.5°F | Resp 18

## 2024-11-07 DIAGNOSIS — D509 Iron deficiency anemia, unspecified: Secondary | ICD-10-CM | POA: Diagnosis present

## 2024-11-07 DIAGNOSIS — D500 Iron deficiency anemia secondary to blood loss (chronic): Secondary | ICD-10-CM

## 2024-11-07 MED ORDER — IRON SUCROSE 20 MG/ML IV SOLN
200.0000 mg | Freq: Once | INTRAVENOUS | Status: AC
  Administered 2024-11-07: 200 mg via INTRAVENOUS
  Filled 2024-11-07: qty 10

## 2024-11-08 ENCOUNTER — Ambulatory Visit

## 2024-11-15 ENCOUNTER — Other Ambulatory Visit: Payer: Self-pay | Admitting: Gastroenterology

## 2024-11-15 DIAGNOSIS — R1110 Vomiting, unspecified: Secondary | ICD-10-CM

## 2024-11-17 ENCOUNTER — Telehealth: Payer: Self-pay | Admitting: Oncology

## 2024-11-17 NOTE — Telephone Encounter (Signed)
 Pt spouse called to r/s appt from 1/28 due to weather. Appt r/s and new date/time confirmed

## 2024-11-20 ENCOUNTER — Encounter: Admitting: Internal Medicine

## 2024-11-22 ENCOUNTER — Inpatient Hospital Stay

## 2024-11-28 ENCOUNTER — Encounter: Payer: Self-pay | Admitting: Family Medicine

## 2024-11-28 ENCOUNTER — Inpatient Hospital Stay: Attending: Oncology

## 2024-12-07 ENCOUNTER — Inpatient Hospital Stay

## 2025-01-05 ENCOUNTER — Encounter

## 2025-01-19 ENCOUNTER — Encounter: Admitting: Internal Medicine

## 2025-01-25 ENCOUNTER — Inpatient Hospital Stay

## 2025-02-01 ENCOUNTER — Inpatient Hospital Stay: Admitting: Oncology

## 2025-02-01 ENCOUNTER — Inpatient Hospital Stay

## 2025-06-08 ENCOUNTER — Ambulatory Visit

## 2025-06-22 ENCOUNTER — Encounter: Admitting: Family Medicine
